# Patient Record
Sex: Male | Born: 1935 | Race: White | Hispanic: No | Marital: Married | Smoking: Former smoker
Health system: Southern US, Community
[De-identification: ages and names within clinical notes are randomized; demographics above are authoritative.]

## PROBLEM LIST (undated history)

## (undated) DIAGNOSIS — D696 Thrombocytopenia, unspecified: Secondary | ICD-10-CM

## (undated) DIAGNOSIS — E11319 Type 2 diabetes mellitus with unspecified diabetic retinopathy without macular edema: Secondary | ICD-10-CM

## (undated) DIAGNOSIS — I4891 Unspecified atrial fibrillation: Secondary | ICD-10-CM

## (undated) DIAGNOSIS — I509 Heart failure, unspecified: Secondary | ICD-10-CM

## (undated) DIAGNOSIS — E119 Type 2 diabetes mellitus without complications: Secondary | ICD-10-CM

## (undated) DIAGNOSIS — Z862 Personal history of diseases of the blood and blood-forming organs and certain disorders involving the immune mechanism: Secondary | ICD-10-CM

## (undated) DIAGNOSIS — K5790 Diverticulosis of intestine, part unspecified, without perforation or abscess without bleeding: Secondary | ICD-10-CM

## (undated) DIAGNOSIS — I10 Essential (primary) hypertension: Secondary | ICD-10-CM

## (undated) DIAGNOSIS — E7800 Pure hypercholesterolemia, unspecified: Secondary | ICD-10-CM

## (undated) DIAGNOSIS — I25810 Atherosclerosis of coronary artery bypass graft(s) without angina pectoris: Secondary | ICD-10-CM

## (undated) DIAGNOSIS — J180 Bronchopneumonia, unspecified organism: Secondary | ICD-10-CM

## (undated) DIAGNOSIS — I2789 Other specified pulmonary heart diseases: Secondary | ICD-10-CM

## (undated) DIAGNOSIS — N189 Chronic kidney disease, unspecified: Secondary | ICD-10-CM

## (undated) DIAGNOSIS — H269 Unspecified cataract: Secondary | ICD-10-CM

## (undated) DIAGNOSIS — I219 Acute myocardial infarction, unspecified: Secondary | ICD-10-CM

## (undated) DIAGNOSIS — I6529 Occlusion and stenosis of unspecified carotid artery: Secondary | ICD-10-CM

## (undated) HISTORY — DX: Type 2 diabetes mellitus with unspecified diabetic retinopathy without macular edema: E11.319

## (undated) HISTORY — PX: POLYPECTOMY: SHX149

## (undated) HISTORY — DX: Thrombocytopenia, unspecified: D69.6

## (undated) HISTORY — DX: Pure hypercholesterolemia, unspecified: E78.00

## (undated) HISTORY — DX: Chronic kidney disease, unspecified: N18.9

## (undated) HISTORY — DX: Unspecified cataract: H26.9

## (undated) HISTORY — DX: Diverticulosis of intestine, part unspecified, without perforation or abscess without bleeding: K57.90

## (undated) HISTORY — DX: Personal history of diseases of the blood and blood-forming organs and certain disorders involving the immune mechanism: Z86.2

## (undated) HISTORY — DX: Other specified pulmonary heart diseases: I27.89

## (undated) HISTORY — PX: COLONOSCOPY: SHX174

## (undated) HISTORY — DX: Atherosclerosis of coronary artery bypass graft(s) without angina pectoris: I25.810

## (undated) HISTORY — PX: TONSILLECTOMY: SUR1361

## (undated) HISTORY — DX: Essential (primary) hypertension: I10

## (undated) HISTORY — DX: Bronchopneumonia, unspecified organism: J18.0

## (undated) HISTORY — DX: Type 2 diabetes mellitus without complications: E11.9

## (undated) HISTORY — PX: EYE SURGERY: SHX253

## (undated) HISTORY — DX: Occlusion and stenosis of unspecified carotid artery: I65.29

## (undated) HISTORY — PX: CORONARY ARTERY BYPASS GRAFT: SHX141

## (undated) HISTORY — DX: Acute myocardial infarction, unspecified: I21.9

---

## 2001-08-14 ENCOUNTER — Ambulatory Visit (HOSPITAL_COMMUNITY): Admission: RE | Admit: 2001-08-14 | Discharge: 2001-08-14 | Payer: Self-pay | Admitting: Family Medicine

## 2001-08-14 ENCOUNTER — Encounter: Payer: Self-pay | Admitting: Family Medicine

## 2005-08-24 DIAGNOSIS — I219 Acute myocardial infarction, unspecified: Secondary | ICD-10-CM

## 2005-08-24 HISTORY — DX: Acute myocardial infarction, unspecified: I21.9

## 2005-10-19 ENCOUNTER — Ambulatory Visit: Payer: Self-pay | Admitting: Cardiovascular Disease

## 2005-10-23 ENCOUNTER — Encounter: Payer: Self-pay | Admitting: Cardiology

## 2005-10-23 ENCOUNTER — Ambulatory Visit: Payer: Self-pay

## 2005-10-23 ENCOUNTER — Inpatient Hospital Stay (HOSPITAL_BASED_OUTPATIENT_CLINIC_OR_DEPARTMENT_OTHER): Admission: RE | Admit: 2005-10-23 | Discharge: 2005-10-24 | Payer: Self-pay | Admitting: Cardiology

## 2005-10-23 ENCOUNTER — Ambulatory Visit: Payer: Self-pay | Admitting: Cardiology

## 2005-10-24 ENCOUNTER — Ambulatory Visit: Payer: Self-pay | Admitting: Cardiology

## 2005-10-24 ENCOUNTER — Inpatient Hospital Stay (HOSPITAL_COMMUNITY): Admission: AD | Admit: 2005-10-24 | Discharge: 2005-10-31 | Payer: Self-pay | Admitting: Cardiology

## 2005-11-13 ENCOUNTER — Ambulatory Visit: Payer: Self-pay | Admitting: Cardiology

## 2005-11-29 ENCOUNTER — Encounter (HOSPITAL_COMMUNITY): Admission: RE | Admit: 2005-11-29 | Discharge: 2006-02-27 | Payer: Self-pay | Admitting: Cardiology

## 2005-12-06 ENCOUNTER — Encounter: Payer: Self-pay | Admitting: Cardiology

## 2005-12-06 ENCOUNTER — Ambulatory Visit: Payer: Self-pay

## 2005-12-06 ENCOUNTER — Ambulatory Visit: Payer: Self-pay | Admitting: Cardiology

## 2005-12-13 ENCOUNTER — Ambulatory Visit: Payer: Self-pay | Admitting: Cardiology

## 2005-12-21 ENCOUNTER — Ambulatory Visit: Payer: Self-pay | Admitting: Cardiology

## 2005-12-25 ENCOUNTER — Ambulatory Visit: Payer: Self-pay | Admitting: Cardiology

## 2005-12-26 ENCOUNTER — Ambulatory Visit: Payer: Self-pay | Admitting: Cardiology

## 2005-12-28 ENCOUNTER — Ambulatory Visit: Payer: Self-pay | Admitting: Cardiology

## 2006-01-04 ENCOUNTER — Ambulatory Visit: Payer: Self-pay | Admitting: Internal Medicine

## 2006-01-10 ENCOUNTER — Ambulatory Visit: Payer: Self-pay | Admitting: Cardiology

## 2006-02-22 ENCOUNTER — Ambulatory Visit: Payer: Self-pay | Admitting: Cardiology

## 2006-02-28 ENCOUNTER — Encounter (HOSPITAL_COMMUNITY): Admission: RE | Admit: 2006-02-28 | Discharge: 2006-03-22 | Payer: Self-pay | Admitting: Cardiology

## 2006-03-01 ENCOUNTER — Ambulatory Visit: Payer: Self-pay | Admitting: Cardiology

## 2006-05-13 ENCOUNTER — Encounter: Payer: Self-pay | Admitting: Internal Medicine

## 2006-05-16 ENCOUNTER — Encounter: Payer: Self-pay | Admitting: Internal Medicine

## 2006-05-28 ENCOUNTER — Ambulatory Visit: Payer: Self-pay

## 2006-05-28 ENCOUNTER — Encounter: Payer: Self-pay | Admitting: Internal Medicine

## 2006-05-29 ENCOUNTER — Ambulatory Visit: Payer: Self-pay | Admitting: Cardiology

## 2006-07-01 ENCOUNTER — Encounter: Payer: Self-pay | Admitting: Internal Medicine

## 2006-09-26 ENCOUNTER — Encounter: Payer: Self-pay | Admitting: Internal Medicine

## 2006-12-03 ENCOUNTER — Ambulatory Visit: Payer: Self-pay | Admitting: Cardiology

## 2007-07-29 ENCOUNTER — Ambulatory Visit: Payer: Self-pay | Admitting: Cardiology

## 2007-09-18 ENCOUNTER — Ambulatory Visit: Payer: Self-pay

## 2007-09-18 ENCOUNTER — Encounter: Payer: Self-pay | Admitting: Cardiology

## 2008-04-23 ENCOUNTER — Ambulatory Visit: Payer: Self-pay | Admitting: Cardiovascular Disease

## 2008-04-27 ENCOUNTER — Ambulatory Visit: Payer: Self-pay

## 2008-04-27 ENCOUNTER — Ambulatory Visit: Payer: Self-pay | Admitting: Cardiovascular Disease

## 2008-04-27 LAB — CONVERTED CEMR LAB
BUN: 22 mg/dL (ref 6–23)
Basophils Absolute: 0 10*3/uL (ref 0.0–0.1)
Basophils Relative: 0.5 % (ref 0.0–3.0)
CO2: 27 meq/L (ref 19–32)
Calcium: 9.2 mg/dL (ref 8.4–10.5)
Chloride: 108 meq/L (ref 96–112)
Creatinine, Ser: 1.1 mg/dL (ref 0.4–1.5)
Eosinophils Absolute: 0.2 10*3/uL (ref 0.0–0.7)
Eosinophils Relative: 2.6 % (ref 0.0–5.0)
GFR calc Af Amer: 85 mL/min
GFR calc non Af Amer: 70 mL/min
Glucose, Bld: 164 mg/dL — ABNORMAL HIGH (ref 70–99)
HCT: 41.6 % (ref 39.0–52.0)
Hemoglobin: 14.2 g/dL (ref 13.0–17.0)
INR: 0.9 (ref 0.8–1.0)
Lymphocytes Relative: 19.8 % (ref 12.0–46.0)
MCHC: 34.2 g/dL (ref 30.0–36.0)
MCV: 94.6 fL (ref 78.0–100.0)
Monocytes Absolute: 0.6 10*3/uL (ref 0.1–1.0)
Monocytes Relative: 7.9 % (ref 3.0–12.0)
Neutro Abs: 4.9 10*3/uL (ref 1.4–7.7)
Neutrophils Relative %: 69.2 % (ref 43.0–77.0)
Platelets: 119 10*3/uL — ABNORMAL LOW (ref 150–400)
Potassium: 4.4 meq/L (ref 3.5–5.1)
Prothrombin Time: 10.2 s — ABNORMAL LOW (ref 10.9–13.3)
RBC: 4.39 M/uL (ref 4.22–5.81)
RDW: 13.2 % (ref 11.5–14.6)
Sodium: 140 meq/L (ref 135–145)
TSH: 1.05 microintl units/mL (ref 0.35–5.50)
WBC: 7.1 10*3/uL (ref 4.5–10.5)
aPTT: 29.5 s (ref 21.7–29.8)

## 2008-04-28 ENCOUNTER — Ambulatory Visit: Payer: Self-pay | Admitting: Cardiology

## 2008-04-28 ENCOUNTER — Ambulatory Visit (HOSPITAL_COMMUNITY): Admission: RE | Admit: 2008-04-28 | Discharge: 2008-04-28 | Payer: Self-pay | Admitting: Cardiology

## 2008-05-27 ENCOUNTER — Ambulatory Visit: Payer: Self-pay | Admitting: Cardiology

## 2008-07-22 DIAGNOSIS — E785 Hyperlipidemia, unspecified: Secondary | ICD-10-CM | POA: Insufficient documentation

## 2008-07-22 DIAGNOSIS — I25810 Atherosclerosis of coronary artery bypass graft(s) without angina pectoris: Secondary | ICD-10-CM | POA: Insufficient documentation

## 2008-07-22 DIAGNOSIS — E1129 Type 2 diabetes mellitus with other diabetic kidney complication: Secondary | ICD-10-CM | POA: Insufficient documentation

## 2008-07-22 DIAGNOSIS — Z862 Personal history of diseases of the blood and blood-forming organs and certain disorders involving the immune mechanism: Secondary | ICD-10-CM | POA: Insufficient documentation

## 2008-07-22 DIAGNOSIS — N184 Chronic kidney disease, stage 4 (severe): Secondary | ICD-10-CM | POA: Insufficient documentation

## 2008-07-22 DIAGNOSIS — N183 Chronic kidney disease, stage 3 unspecified: Secondary | ICD-10-CM | POA: Insufficient documentation

## 2008-07-22 DIAGNOSIS — I739 Peripheral vascular disease, unspecified: Secondary | ICD-10-CM | POA: Insufficient documentation

## 2008-07-22 DIAGNOSIS — I10 Essential (primary) hypertension: Secondary | ICD-10-CM | POA: Insufficient documentation

## 2008-07-26 ENCOUNTER — Ambulatory Visit: Payer: Self-pay | Admitting: Cardiology

## 2008-07-26 ENCOUNTER — Encounter: Payer: Self-pay | Admitting: Cardiology

## 2008-07-26 ENCOUNTER — Ambulatory Visit: Payer: Self-pay

## 2008-07-26 DIAGNOSIS — D696 Thrombocytopenia, unspecified: Secondary | ICD-10-CM | POA: Insufficient documentation

## 2008-07-26 DIAGNOSIS — I272 Pulmonary hypertension, unspecified: Secondary | ICD-10-CM | POA: Insufficient documentation

## 2008-08-02 LAB — CONVERTED CEMR LAB
Basophils Absolute: 0 10*3/uL (ref 0.0–0.1)
Basophils Relative: 0.4 % (ref 0.0–3.0)
Eosinophils Absolute: 0.3 10*3/uL (ref 0.0–0.7)
Eosinophils Relative: 3.3 % (ref 0.0–5.0)
HCT: 38.4 % — ABNORMAL LOW (ref 39.0–52.0)
Hemoglobin: 12.9 g/dL — ABNORMAL LOW (ref 13.0–17.0)
Lymphocytes Relative: 24.2 % (ref 12.0–46.0)
Lymphs Abs: 1.9 10*3/uL (ref 0.7–4.0)
MCHC: 33.5 g/dL (ref 30.0–36.0)
MCV: 94.4 fL (ref 78.0–100.0)
Monocytes Absolute: 0.4 10*3/uL (ref 0.1–1.0)
Monocytes Relative: 5.4 % (ref 3.0–12.0)
Neutro Abs: 5.4 10*3/uL (ref 1.4–7.7)
Neutrophils Relative %: 66.7 % (ref 43.0–77.0)
Platelets: 149 10*3/uL — ABNORMAL LOW (ref 150.0–400.0)
RBC: 4.06 M/uL — ABNORMAL LOW (ref 4.22–5.81)
RDW: 13.3 % (ref 11.5–14.6)
WBC: 8 10*3/uL (ref 4.5–10.5)

## 2008-08-20 ENCOUNTER — Encounter: Admission: RE | Admit: 2008-08-20 | Discharge: 2008-08-20 | Payer: Self-pay | Admitting: Family Medicine

## 2008-08-20 ENCOUNTER — Encounter: Payer: Self-pay | Admitting: Cardiology

## 2008-08-24 ENCOUNTER — Encounter: Payer: Self-pay | Admitting: Cardiology

## 2008-10-20 ENCOUNTER — Ambulatory Visit (HOSPITAL_BASED_OUTPATIENT_CLINIC_OR_DEPARTMENT_OTHER): Admission: RE | Admit: 2008-10-20 | Discharge: 2008-10-20 | Payer: Self-pay | Admitting: Urology

## 2008-10-22 ENCOUNTER — Ambulatory Visit: Payer: Self-pay | Admitting: Cardiology

## 2009-05-26 ENCOUNTER — Ambulatory Visit: Payer: Self-pay | Admitting: Cardiology

## 2009-05-26 ENCOUNTER — Telehealth: Payer: Self-pay | Admitting: Cardiology

## 2009-06-08 ENCOUNTER — Encounter (INDEPENDENT_AMBULATORY_CARE_PROVIDER_SITE_OTHER): Payer: Self-pay | Admitting: *Deleted

## 2009-06-09 ENCOUNTER — Encounter (INDEPENDENT_AMBULATORY_CARE_PROVIDER_SITE_OTHER): Payer: Self-pay | Admitting: *Deleted

## 2009-06-09 ENCOUNTER — Ambulatory Visit: Payer: Self-pay | Admitting: Internal Medicine

## 2009-06-21 ENCOUNTER — Telehealth (INDEPENDENT_AMBULATORY_CARE_PROVIDER_SITE_OTHER): Payer: Self-pay | Admitting: *Deleted

## 2009-06-23 ENCOUNTER — Ambulatory Visit: Payer: Self-pay | Admitting: Internal Medicine

## 2009-06-24 ENCOUNTER — Encounter: Payer: Self-pay | Admitting: Internal Medicine

## 2009-07-27 ENCOUNTER — Ambulatory Visit: Payer: Self-pay

## 2009-07-27 ENCOUNTER — Encounter: Payer: Self-pay | Admitting: Cardiology

## 2009-08-09 ENCOUNTER — Telehealth: Payer: Self-pay | Admitting: Cardiology

## 2009-08-23 ENCOUNTER — Encounter: Payer: Self-pay | Admitting: Cardiology

## 2009-08-23 ENCOUNTER — Ambulatory Visit: Payer: Self-pay | Admitting: Vascular Surgery

## 2009-08-29 ENCOUNTER — Ambulatory Visit (HOSPITAL_COMMUNITY): Admission: RE | Admit: 2009-08-29 | Discharge: 2009-08-29 | Payer: Self-pay | Admitting: Ophthalmology

## 2009-12-05 ENCOUNTER — Ambulatory Visit: Payer: Self-pay | Admitting: Cardiology

## 2010-02-07 ENCOUNTER — Encounter (INDEPENDENT_AMBULATORY_CARE_PROVIDER_SITE_OTHER): Payer: Self-pay | Admitting: *Deleted

## 2010-02-08 ENCOUNTER — Encounter: Payer: Self-pay | Admitting: Cardiology

## 2010-02-08 ENCOUNTER — Ambulatory Visit: Payer: Self-pay

## 2010-03-26 HISTORY — PX: CATARACT EXTRACTION: SUR2

## 2010-04-23 LAB — CONVERTED CEMR LAB
BUN: 20 mg/dL (ref 6–23)
Basophils Absolute: 0.1 10*3/uL (ref 0.0–0.1)
Basophils Relative: 0.9 % (ref 0.0–3.0)
CO2: 30 meq/L (ref 19–32)
Calcium: 9 mg/dL (ref 8.4–10.5)
Chloride: 105 meq/L (ref 96–112)
Creatinine, Ser: 1.2 mg/dL (ref 0.4–1.5)
Eosinophils Absolute: 0.2 10*3/uL (ref 0.0–0.7)
Eosinophils Relative: 2.6 % (ref 0.0–5.0)
GFR calc non Af Amer: 63.31 mL/min (ref >60)
Glucose, Bld: 99 mg/dL (ref 70–99)
HCT: 38.2 % — ABNORMAL LOW (ref 39.0–52.0)
Hemoglobin: 13 g/dL (ref 13.0–17.0)
Lymphocytes Relative: 20.1 % (ref 12.0–46.0)
Lymphs Abs: 1.9 10*3/uL (ref 0.7–4.0)
MCHC: 34.1 g/dL (ref 30.0–36.0)
MCV: 94.7 fL (ref 78.0–100.0)
Monocytes Absolute: 0.8 10*3/uL (ref 0.1–1.0)
Monocytes Relative: 8.1 % (ref 3.0–12.0)
Neutro Abs: 6.5 10*3/uL (ref 1.4–7.7)
Neutrophils Relative %: 68.3 % (ref 43.0–77.0)
Platelets: 156 10*3/uL (ref 150.0–400.0)
Potassium: 4.4 meq/L (ref 3.5–5.1)
RBC: 4.04 M/uL — ABNORMAL LOW (ref 4.22–5.81)
RDW: 13.6 % (ref 11.5–14.6)
Sodium: 139 meq/L (ref 135–145)
WBC: 9.5 10*3/uL (ref 4.5–10.5)

## 2010-04-27 NOTE — Assessment & Plan Note (Signed)
Summary: 4 MO F/U   Visit Type:  4 months follow up  CC:  No complains.  History of Present Illness: Had a circumcision done on Wednesday.  Doing pretty well from that standpoint.  Has not had full physical.  Still exercising regularly.  Current Medications (verified): 1)  Omeprazole 20 Mg Tbec (Omeprazole) .... Take 1 Tablet By Mouth Two Times A Day 2)  Metaglip 5-500 Mg Tabs (Glipizide-Metformin Hcl) .... Take Two Tablets P.o. Two Times A Day 3)  Aspirin 81 Mg Tbec (Aspirin) .... Take One Tablet By Mouth Daily 4)  Actos 45 Mg Tabs (Pioglitazone Hcl) .... Take As Directed 5)  Simvastatin 20 Mg Tabs (Simvastatin) .... Take One Tablet By Mouth Daily At Bedtime 6)  Multivitamins  Tabs (Multiple Vitamin) .... Take 1 Tablet By Mouth Once A Day 7)  Fish Oil 1200 Mg Caps (Omega-3 Fatty Acids) .... Take 1 Capsule By Mouth Once A Day 8)  Vitamin C 500 Mg  Tabs (Ascorbic Acid) .... Take 1 Tablet By Mouth Once A Day 9)  Vitamin D 400 Unit  Tabs (Cholecalciferol) .... Take 1 Tablet By Mouth Once A Day 10)  Toprol Xl 25 Mg Xr24h-Tab (Metoprolol Succinate) .... Take 1 Tablet By Mouth Once A Day 11)  Benicar 20 Mg Tabs (Olmesartan Medoxomil) .... Take 1 Tablet By Mouth Once A Day 12)  Nitroglycerin 0.4 Mg Subl (Nitroglycerin) .... One Tablet Under Tongue Every 5 Minutes As Needed For Chest Pain---May Repeat Times Three 13)  Glucosamine Msm 1500- 1500mg  .... Once A Day  Allergies (verified): No Known Drug Allergies  Vital Signs:  Patient profile:   75 year old male Height:      72 inches Weight:      238 pounds BMI:     32.40 Pulse rate:   51 / minute Pulse rhythm:   regular Resp:     18 per minute BP sitting:   128 / 62  (left arm) Cuff size:   large  Vitals Entered By: Sidney Ace (October 22, 2008 11:07 AM)  Physical Exam  General:  Well developed, well nourished, in no acute distress. Chest Wall:  Median sternotomy well healed. Lungs:  Clear bilaterally to auscultation and  percussion. Heart:  Non-displaced PMI, chest non-tender; regular rate and rhythm, S1, S2 without murmurs, rubs or gallops. Carotid upstroke normal, no bruit. Normal abdominal aortic size, no bruits. Femorals normal pulses, no bruits. Pedals normal pulses. No edema, no varicosities. Msk:  Back normal, normal gait. Muscle strength and tone normal. Extremities:  No edema.   EKG  Procedure date:  10/22/2008  Findings:      NSR.  Anterior MI, old.  Lower voltage  Impression & Recommendations:  Problem # 1:  CAD, ARTERY BYPASS GRAFT (ICD-414.04)  Stable.  No symptoms.  Weight loss suggested.  Orders: EKG w/ Interpretation (93000)  Problem # 2:  HYPERCHOLESTEROLEMIA (ICD-272.0) followuup with Dr. Kenton Kingfisher. His updated medication list for this problem includes:    Simvastatin 20 Mg Tabs (Simvastatin) .[name]... Take one tablet by mouth daily at bedtime  Patient Instructions: 1)  Your physician recommends that you continue on your current medications as directed. Please refer to the Current Medication list given to you today. 2)  Your physician wants you to follow-up in:   6 MONTHS. You will receive a reminder letter in the mail two months in advance. If you don't receive a letter, please call our office to schedule the follow-up appointment.

## 2010-04-27 NOTE — Progress Notes (Signed)
Summary: 1500 mg glucasmine  Phone Note Call from Patient Call back at Home Phone [phone]   Caller: Patient Reason for Call: Talk to Nurse Details for Reason: Per pt calling, pt still take 1500 mg glucasomine.  Initial call taken by: Neil Crouch,  May 26, 2009 2:14 PM  Follow-up for Phone Call        Left message to call back. Theodosia Quay, RN, BSN  May 26, 2009 2:41 PM  I spoke with the pt's wife and she said the pt is also taking glucosamine and this was not on his list.  I told the pt's wife that I would update his medication list.  Follow-up by: Theodosia Quay, RN, BSN,  May 30, 2009 12:11 PM    New/Updated Medications: GLUCOSAMINE 500 MG CAPS (GLUCOSAMINE SULFATE) 3 daily

## 2010-04-27 NOTE — Procedures (Signed)
Summary: EGD/[facility]   Imported By: Phillis Knack 07/05/2009 11:48:01  _____________________________________________________________________  External Attachment:    Type:   Image     Comment:   External Document

## 2010-04-27 NOTE — Procedures (Signed)
Summary: Colonoscopy  Patient: [name] Note: All result statuses are Final unless otherwise noted.  Tests: (1) Colonoscopy (COL)   COL Colonoscopy           [address]     [place], [place]  [id number]           COLONOSCOPY PROCEDURE REPORT           PATIENT:  [name], [name]  MR#:  [id number]     BIRTHDATE:  05-May-1935, 73 yrs. old  GENDER:  male     ENDOSCOPIST:  Docia Chuck. Geri Seminole, MD     REF. BY:  .Direct Self     PROCEDURE DATE:  06/23/2009     PROCEDURE:  Colonoscopy with snare polypectomy x 2     ASA CLASS:  Class II     INDICATIONS:  history of pre-cancerous (adenomatous) colon polyps     ; reports hx adenomas x 2  and suboptimal prep w/ Dr Ferdinand Lango in     [place] 3 years ago - received a recall letter     MEDICATIONS:   Fentanyl 75 mcg IV, Versed 10 mg IV           DESCRIPTION OF PROCEDURE:   After the risks benefits and     alternatives of the procedure were thoroughly explained, informed     consent was obtained.  Digital rectal exam was performed and     revealed no abnormalities.   The LB [id number] endoscope     was introduced through the anus and advanced to the cecum, which     was identified by both the appendix and ileocecal valve, limited     by fair prep. Time to cecum = 7:58 min. The quality of the prep     was Moviprep fair.  The instrument was then slowly withdrawn (time     = 17:15 min) as the colon was fully examined.     <<PROCEDUREIMAGES>>           FINDINGS:  Two polyps, 17mm,4mm,  were found in the ascending     colon. Polyps were snared without cautery. Retrieval was     successful. snare polyp  Mild diverticulosis was found in the     sigmoid colon.  fair prep upgraded to adequate w/ vigorous     irrigation and suctioning..   Retroflexed views in the rectum     revealed no abnormalities.    The scope was then withdrawn from     the patient and the procedure completed.           COMPLICATIONS:   None     ENDOSCOPIC IMPRESSION:     1) Two polyps in the ascending colon -removed     2) Mild diverticulosis in the sigmoid colon     3) Fair prep     RECOMMENDATIONS:     1) Obtain prior colonoscopy and pathology reports from Dr. Virgel Bouquet [facility]) for my review     2) Follow up colonoscopy in 3 years. RECOMMEND NO SEEDS / NUTS /     BEANS / RUFFAGE ONE WEEK PRIOR AND CLEAR LIQUIDS TWO DAYS PRIOR           ______________________________     Docia Chuck. Geri Seminole, MD           CC:  Azalia Bilis, MD;The Patient  n.     eSIGNED:   Docia Chuck. Geri Seminole at 06/23/2009 11:13 AM           Alberteen Spindle, [id number]  Note: An exclamation mark (!) indicates a result that was not dispersed into the flowsheet. Document Creation Date: 06/23/2009 11:14 AM _______________________________________________________________________  (1) Order result status: Final Collection or observation date-time: 06/23/2009 11:03 Requested date-time:  Receipt date-time:  Reported date-time:  Referring Physician:   Ordering Physician: Lavena Bullion [id number]) Specimen Source:  Source: Tawanna Cooler Order Number: [id number] Lab site:   Appended Document: Colonoscopy recall     Procedures Next Due Date:    Colonoscopy: 06/2012

## 2010-04-27 NOTE — Assessment & Plan Note (Signed)
Summary: 2 month rov/sl      Allergies Added: NKDA  Visit Type:  Follow-up  CC:  No complains.  History of Present Illness: Back to being active, and no recurrence of symptoms.  Still taking antacid twice daily. But feels better.   Working out on a regular basis, 3 or 4 times/week.  No problems at all on the treadmill. Cut back to 50 above resting heartrate. No chest pain.  Current Medications (verified): 1)  Omeprazole 20 Mg Tbec (Omeprazole) .... Take 1 Tablet By Mouth Two Times A Day 2)  Metaglip 5-500 Mg Tabs (Glipizide-Metformin Hcl) .... Take Two Tablets P.o. Two Times A Day 3)  Aspirin 81 Mg Tbec (Aspirin) .... Take One Tablet By Mouth Daily 4)  Actos 45 Mg Tabs (Pioglitazone Hcl) .... Take As Directed 5)  Simvastatin 20 Mg Tabs (Simvastatin) .... Take One Tablet By Mouth Daily At Bedtime 6)  Multivitamins  Tabs (Multiple Vitamin) .... Take 1 Tablet By Mouth Once A Day 7)  Fish Oil 1200 Mg Caps (Omega-3 Fatty Acids) .... Take 1 Capsule By Mouth Once A Day 8)  Vitamin C 500 Mg  Tabs (Ascorbic Acid) .... Take 1 Tablet By Mouth Once A Day 9)  Vitamin D 400 Unit  Tabs (Cholecalciferol) .... Take 1 Tablet By Mouth Once A Day 10)  Toprol Xl 25 Mg Xr24h-Tab (Metoprolol Succinate) .... Take 1 Tablet By Mouth Once A Day 11)  Benicar 20 Mg Tabs (Olmesartan Medoxomil) .... Take 1 Tablet By Mouth Once A Day 12)  Nitroglycerin 0.4 Mg Subl (Nitroglycerin) .... One Tablet Under Tongue Every 5 Minutes As Needed For Chest Pain---May Repeat Times Three  Allergies (verified): No Known Drug Allergies  Vital Signs:  Patient profile:   75 year old male Height:      72 inches Weight:      236.25 pounds BMI:     32.16 Pulse rate:   50 / minute Pulse rhythm:   regular Resp:     18 per minute BP sitting:   130 / 70  (left arm) Cuff size:   large  Vitals Entered By: Sidney Ace (Jul 26, 2008 3:20 PM)  Physical Exam  General:  Well developed, well nourished, in no acute  distress. Lungs:  Clear bilaterally to auscultation and percussion. Heart:  No definite murmur.   Extremities:  No edema   EKG  Procedure date:  07/26/2008  Findings:      NSR.  Left axis deviation.  Anterolateral infarct, old  Echocardiogram  Procedure date:  07/26/2008  Findings:      Study Conclusions  1. Left ventricle: The cavity size was normal. Wall thickness was    normal. Doppler parameters are consistent with abnormal left    ventricular relaxation (grade 1 diastolic dysfunction). 2. Mitral valve: Mildly calcified annulus. Mildly thickened    leaflets. . 3. Pulmonary arteries: PA peak pressure: 48mm Hg (S).   Impression & Recommendations:  Problem # 1:  CAD, ARTERY BYPASS GRAFT (ICD-414.04) see cath report.  Continue medical therapy. His updated medication list for this problem includes:    Aspirin 81 Mg Tbec (Aspirin) .[name]... Take one tablet by mouth daily    Toprol Xl 25 Mg Xr24h-tab (Metoprolol succinate) .[name]... Take 1 tablet by mouth once a day    Nitroglycerin 0.4 Mg Subl (Nitroglycerin) ..... One tablet under tongue every 5 minutes as needed for chest pain---may repeat times three  Orders: TLB-CBC Platelet - w/Differential (85025-CBCD) EKG w/ Interpretation (93000)  Problem # 2:  HYPERCHOLESTEROLEMIA (ICD-272.0) Continue followup with primary MD. His updated medication list for this problem includes:    Simvastatin 20 Mg Tabs (Simvastatin) .[name]... Take one tablet by mouth daily at bedtime  Problem # 3:  UNSPECIFIED THROMBOCYTOPENIA (ICD-287.5) Recheck platelet count.  Borderline at time of cath procedure.  Problem # 4:  HYPERTENSION (ICD-401.9) Continue. His updated medication list for this problem includes:    Aspirin 81 Mg Tbec (Aspirin) .[name]... Take one tablet by mouth daily    Toprol Xl 25 Mg Xr24h-tab (Metoprolol succinate) .[name]... Take 1 tablet by mouth once a day    Benicar 20 Mg Tabs (Olmesartan medoxomil) .[name]... Take 1 tablet by mouth once a  day  Orders: TLB-CBC Platelet - w/Differential (85025-CBCD) EKG w/ Interpretation (93000)  Problem # 5:  PULMONARY HYPERTENSION (ICD-416.8) Borderline by echo.  He will talk with his wife and we will consider a sleep study.  Patient Instructions: 1)  Your physician recommends that you schedule a follow-up appointment in: 3-4 months 2)  Your physician recommends that you have lab work today: CBC(platelet) 3)  Your physician recommends that you continue on your current medications as directed. Please refer to the Current Medication list given to you today.

## 2010-04-27 NOTE — Letter (Signed)
Summary: [facility] Instructions  [facility]  [place], [place] [address]   Phone: [phone]  Fax: [phone]       [name]    February 09, 1936    MRN: [id number]        Procedure Day Sudie Grumbling:  Raquel Sarna  06/23/09     Arrival Time:  9:00AM     Procedure Time:  10:00AM     Location of Procedure:                    [address] _  [place] (4th Floor)                        [place]   Starting 5 days prior to your procedure 06/18/09 do not eat nuts, seeds, popcorn, corn, beans, peas,  salads, or any raw vegetables.  Do not take any fiber supplements (e.g. Metamucil, Citrucel, and Benefiber).  THE DAY BEFORE YOUR PROCEDURE         DATE: 06/22/09  DAY: WEDNESDAY  1.  Drink clear liquids the entire day-NO SOLID FOOD  2.  Do not drink anything colored red or purple.  Avoid juices with pulp.  No orange juice.  3.  Drink at least 64 oz. (8 glasses) of fluid/clear liquids during the day to prevent dehydration and help the prep work efficiently.  CLEAR LIQUIDS INCLUDE: Water Jello Ice Popsicles Tea (sugar ok, no milk/cream) Powdered fruit flavored drinks Coffee (sugar ok, no milk/cream) Gatorade Juice: apple, white grape, white cranberry  Lemonade Clear bullion, consomm, broth Carbonated beverages (any kind) Strained chicken noodle soup Hard Candy                             4.  In the morning, mix first dose of MoviPrep solution:    Empty 1 Pouch A and 1 Pouch B into the disposable container    Add lukewarm drinking water to the top line of the container. Mix to dissolve    Refrigerate (mixed solution should be used within 24 hrs)  5.  Begin drinking the prep at 5:00 p.m. The MoviPrep container is divided by 4 marks.   Every 15 minutes drink the solution down to the next mark (approximately 8 oz) until the full liter is complete.   6.  Follow completed prep with 16 oz of clear liquid of your choice  (Nothing red or purple).  Continue to drink clear liquids until bedtime.  7.  Before going to bed, mix second dose of MoviPrep solution:    Empty 1 Pouch A and 1 Pouch B into the disposable container    Add lukewarm drinking water to the top line of the container. Mix to dissolve    Refrigerate  THE DAY OF YOUR PROCEDURE      DATE: 06/23/09 DAY: THURSDAY  Beginning at 5:00AM (5 hours before procedure):         1. Every 15 minutes, drink the solution down to the next mark (approx 8 oz) until the full liter is complete.  2. Follow completed prep with 16 oz. of clear liquid of your choice.    3. You may drink clear liquids until 8:00AM (2 HOURS BEFORE PROCEDURE).   MEDICATION INSTRUCTIONS  Unless otherwise instructed, you should take regular prescription medications with a small sip of water   as early as possible the morning of  your procedure.  Diabetic patients - see separate instructions.           OTHER INSTRUCTIONS  You will need a responsible adult at least 73 years of age to accompany you and drive you home.   This person must remain in the waiting room during your procedure.  Wear loose fitting clothing that is easily removed.  Leave jewelry and other valuables at home.  However, you may wish to bring a book to read or  an iPod/MP3 player to listen to music as you wait for your procedure to start.  Remove all body piercing jewelry and leave at home.  Total time from sign-in until discharge is approximately 2-3 hours.  You should go home directly after your procedure and rest.  You can resume normal activities the  day after your procedure.  The day of your procedure you should not:   Drive   Make legal decisions   Operate machinery   Drink alcohol   Return to work  You will receive specific instructions about eating, activities and medications before you leave.    The above instructions have been reviewed and explained to me by   Randall Hiss RN  June 09, 2009 8:28 AM    I fully understand and can verbalize these instructions _____________________________ Date _________

## 2010-04-27 NOTE — Miscellaneous (Signed)
Summary: Orders Update  Clinical Lists Changes  Orders: Added new Test order of Carotid Duplex (Carotid Duplex) - Signed 

## 2010-04-27 NOTE — Letter (Signed)
Summary: Diabetic Instructions  [facility]  [address]   [place], [place] [address]   Phone: [phone]  Fax: [phone]    [name] Jun 04, 1935 MRN: [id number]      ORAL DIABETIC MEDICATION INSTRUCTIONS  The day before your procedure:   Take your diabetic pill as you do normally  The day of your procedure:   Do not take your diabetic pill    We will check your blood sugar levels during the admission process and again in Recovery before discharging you home  ________________________________________________________________________

## 2010-04-27 NOTE — Consult Note (Signed)
Summary: [facility]  [facility]   Imported By: Ranell Patrick 09/27/2009 13:30:30  _____________________________________________________________________  External Attachment:    Type:   Image     Comment:   External Document

## 2010-04-27 NOTE — Miscellaneous (Signed)
Summary: [facility] previsti  Clinical Lists Changes  Medications: Added new medication of MOVIPREP 100 GM  SOLR (PEG-KCL-NACL-NASULF-NA ASC-C) As per prep instructions. - Signed Rx of MOVIPREP 100 GM  SOLR (PEG-KCL-NACL-NASULF-NA ASC-C) As per prep instructions.;  #1 x 0;  Signed;  Entered by: Randall Hiss RN;  Authorized by: Irene Shipper MD;  Method used: Electronically to [facility] [address], [place], [place]  [id number], Ph: [phone], Fax: [phone]    Prescriptions: MOVIPREP 100 GM  SOLR (PEG-KCL-NACL-NASULF-NA ASC-C) As per prep instructions.  #1 x 0   Entered by:   Randall Hiss RN   Authorized by:   Irene Shipper MD   Signed by:   Randall Hiss RN on 06/09/2009   Method used:   Electronically to        [facility]* (retail)       [place], [place]  [id number]       Ph: [phone]       Fax: [phone]   RxID:   [id number]

## 2010-04-27 NOTE — Procedures (Signed)
Summary: EGD/[facility]   Imported By: Phillis Knack 07/05/2009 11:46:12  _____________________________________________________________________  External Attachment:    Type:   Image     Comment:   External Document

## 2010-04-27 NOTE — Assessment & Plan Note (Signed)
Summary: 6 month  g  Visit Type:  6 mo f/u Primary Provider:  Azalia Bilis  CC:  denies any complaints today.  History of Present Illness: Had GI eval with endo, and then had eye surgery.  Had glaucoma surgery.  Feels good, and exercising regularly.    Current Medications (verified): 1)  Omeprazole 20 Mg Tbec (Omeprazole) .... Take 1 Tablet By Mouth Two Times A Day 2)  Metaglip 5-500 Mg Tabs (Glipizide-Metformin Hcl) .... Take Two Tablets P.o. Two Times A Day 3)  Aspirin 81 Mg Tbec (Aspirin) .... Take One Tablet By Mouth Daily 4)  Actos 45 Mg Tabs (Pioglitazone Hcl) .... Take As Directed 5)  Simvastatin 20 Mg Tabs (Simvastatin) .... Take One Tablet By Mouth Daily At Bedtime 6)  Multivitamins  Tabs (Multiple Vitamin) .... Take 1 Tablet By Mouth Once A Day 7)  Fish Oil 1200 Mg Caps (Omega-3 Fatty Acids) .... Take 1 Capsule By Mouth Once A Day 8)  Vitamin C 500 Mg  Tabs (Ascorbic Acid) .... Take 1 Tablet By Mouth Once A Day 9)  Vitamin D3 1000 Unit Caps (Cholecalciferol) .... Take 1 Capsule By Mouth Once A Day 10)  Toprol Xl 25 Mg Xr24h-Tab (Metoprolol Succinate) .... Take 1 Tablet By Mouth Once A Day 11)  Benicar 20 Mg Tabs (Olmesartan Medoxomil) .... Take 1 Tablet By Mouth Once A Day 12)  Nitroglycerin 0.4 Mg Subl (Nitroglycerin) .... One Tablet Under Tongue Every 5 Minutes As Needed For Chest Pain---May Repeat Times Three 13)  Glucosamine 500 Mg Caps (Glucosamine Sulfate) .[name].. 1 Cap Once Daily  Allergies: 1)  ! * Poison Ivy  Vital Signs:  Patient profile:   75 year old male Height:      72 inches Weight:      229.12 pounds BMI:     31.19 Pulse rate:   58 / minute Pulse rhythm:   irregular BP sitting:   128 / 72  (left arm) Cuff size:   large  Vitals Entered By: Julaine Hua, CMA (December 05, 2009 12:48 PM)  Physical Exam  General:  Well developed, well nourished, in no acute distress. Head:  normocephalic and atraumatic Eyes:  PERRLA/EOM intact; conjunctiva and lids  normal. Lungs:  Clear bilaterally to auscultation and percussion. Heart:  PMI non displaced.  Normal S1 and S2.    Abdomen:  Bowel sounds positive; abdomen soft and non-tender without masses, organomegaly, or hernias noted. No hepatosplenomegaly. Extremities:  No clubbing or cyanosis. Neurologic:  Alert and oriented x 3.   EKG  Procedure date:  12/05/2009  Findings:      NSR.  First degree AV block.  Left axis deviation. ASMI, old.  Impression & Recommendations:  Problem # 1:  CAD, ARTERY BYPASS GRAFT (ICD-414.04) Assessment Unchanged Overall appears stable at present.  Good exercise tolerance.  No chest pain.  Stable status His updated medication list for this problem includes:    Aspirin 81 Mg Tbec (Aspirin) .[name]... Take one tablet by mouth daily    Toprol Xl 25 Mg Xr24h-tab (Metoprolol succinate) .[name]... Take 1 tablet by mouth once a day    Nitroglycerin 0.4 Mg Subl (Nitroglycerin) ..... One tablet under tongue every 5 minutes as needed for chest pain---may repeat times three  Problem # 2:  DIABETES MELLITUS, TYPE II (ICD-250.00) Hgb A!c in the 6 plus range.  Weight an issue, difficult losing.  Encourage His updated medication list for this problem includes:    Metaglip 5-500 Mg Tabs (Glipizide-metformin hcl) .Marland KitchenMarland KitchenMarland KitchenMarland Kitchen  Take two tablets p.o. two times a day    Aspirin 81 Mg Tbec (Aspirin) .[name]... Take one tablet by mouth daily    Actos 45 Mg Tabs (Pioglitazone hcl) .[name]... Take as directed    Benicar 20 Mg Tabs (Olmesartan medoxomil) .[name]... Take 1 tablet by mouth once a day  Problem # 3:  HYPERTENSION (ICD-401.9) Good control on current medications.  His updated medication list for this problem includes:    Aspirin 81 Mg Tbec (Aspirin) .[name]... Take one tablet by mouth daily    Toprol Xl 25 Mg Xr24h-tab (Metoprolol succinate) .[name]... Take 1 tablet by mouth once a day    Benicar 20 Mg Tabs (Olmesartan medoxomil) .[name]... 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 Donnetta Hutching, and they recommended followup in one year.  Dr. Donnetta Hutching will do doppler in his office.  His updated medication list for this problem includes:    Aspirin 81 Mg Tbec (Aspirin) .[name]... Take one tablet by mouth daily  Patient Instructions: 1)  Your physician recommends that you schedule a follow-up appointment in: 1 year with Dr. Lia Foyer Prescriptions: NITROGLYCERIN 0.4 MG SUBL (NITROGLYCERIN) One tablet under tongue every 5 minutes as needed for chest pain---may repeat times three  #25 x 9   Entered by:   Julaine Hua, CMA   Authorized by:   Wadie Lessen, MD, [facility]   Signed by:   Julaine Hua, CMA on 12/05/2009   Method used:   Print then Give to Patient   RxID:   [id number]

## 2010-04-27 NOTE — Letter (Signed)
Summary: Patient Notice- Polyp Results  [facility]  [address] [place], [place] [address]   Phone: [phone]  Fax: [phone]        June 24, 2009 MRN: [id number]    [facility] [address] [place], [place]  [address]    Dear Mr. Wampole,  I am pleased to inform you that the colon polyp(s) removed during your recent colonoscopy was (were) found to be benign (no cancer detected) upon pathologic examination.  I recommend you have a repeat colonoscopy examination in 3 years to look for recurrent polyps, as having colon polyps increases your risk for having recurrent polyps or even colon cancer in the future.  Should you develop new or worsening symptoms of abdominal pain, bowel habit changes or bleeding from the rectum or bowels, please schedule an evaluation with either your primary care physician or with me.  Additional information/recommendations:  __ No further action with gastroenterology is needed at this time. Please      follow-up with your primary care physician for your other healthcare      needs.  Please call us if you are having persistent problems or have questions about your condition that have not been fully answered at this time.  Sincerely,  Irene Shipper MD  This letter has been electronically signed by your physician.  Appended Document: Patient Notice- Polyp Results Letter mailed 4.5.11

## 2010-04-27 NOTE — Assessment & Plan Note (Signed)
Summary: f67m   Visit Type:  6 months follow up  CC:  No cardiac complains.  History of Present Illness: He is doing very well.  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  Has been doing well.  He had no problems.  Exercise tolerance has been good.    Current Medications (verified): 1)  Omeprazole 20 Mg Tbec (Omeprazole) .... Take 1 Tablet By Mouth Two Times A Day 2)  Metaglip 5-500 Mg Tabs (Glipizide-Metformin Hcl) .... Take Two Tablets P.o. Two Times A Day 3)  Aspirin 81 Mg Tbec (Aspirin) .... Take One Tablet By Mouth Daily 4)  Actos 45 Mg Tabs (Pioglitazone Hcl) .... Take As Directed 5)  Simvastatin 20 Mg Tabs (Simvastatin) .... Take One Tablet By Mouth Daily At Bedtime 6)  Multivitamins  Tabs (Multiple Vitamin) .... Take 1 Tablet By Mouth Once A Day 7)  Fish Oil 1200 Mg Caps (Omega-3 Fatty Acids) .... Take 1 Capsule By Mouth Once A Day 8)  Vitamin C 500 Mg  Tabs (Ascorbic Acid) .... Take 1 Tablet By Mouth Once A Day 9)  Vitamin D3 1000 Unit Caps (Cholecalciferol) .... Take 1 Capsule By Mouth Once A Day 10)  Toprol Xl 25 Mg Xr24h-Tab (Metoprolol Succinate) .... Take 1 Tablet By Mouth Once A Day 11)  Benicar 20 Mg Tabs (Olmesartan Medoxomil) .... Take 1 Tablet By Mouth Once A Day 12)  Nitroglycerin 0.4 Mg Subl (Nitroglycerin) .... One Tablet Under Tongue Every 5 Minutes As Needed For Chest Pain---May Repeat Times Three  Allergies (verified): 1)  ! * Poison Ivy  Vital Signs:  Patient profile:   75 year old male Height:      72 inches Weight:      229.25 pounds BMI:     31.20 Pulse rate:   51 / minute Pulse rhythm:   irregular Resp:     18 per minute BP sitting:   114 / 70  (left arm) Cuff size:   large  Vitals Entered By: Sidney Ace (May 26, 2009 11:50 AM)  Physical Exam  General:  Well developed, well nourished, in no acute distress. Head:  normocephalic and atraumatic Lungs:  Clear bilaterally to auscultation and percussion. Heart:  PMI non displaced.   Normal S1 and S2.  NO definite murmurs.   Abdomen:  Bowel sounds positive; abdomen soft and non-tender without masses, organomegaly, or hernias noted. No hepatosplenomegaly.   EKG  Procedure date:  05/26/2009  Findings:      NSR.  Anterolateral MI, old.  Leftward axis.  No acute changes.  Impression & Recommendations:  Problem # 1:  CAD, ARTERY BYPASS GRAFT (ICD-414.04) Currently stable on a medical regimen.  Doing well at present without current symptoms. His updated medication list for this problem includes:    Aspirin 81 Mg Tbec (Aspirin) .[name]... Take one tablet by mouth daily    Toprol Xl 25 Mg Xr24h-tab (Metoprolol succinate) .[name]... Take 1 tablet by mouth once a day    Nitroglycerin 0.4 Mg Subl (Nitroglycerin) ..... One tablet under tongue every 5 minutes as needed for chest pain---may repeat times three  Orders: EKG w/ Interpretation (93000) Carotid Duplex (Carotid Duplex) Abdominal Aorta Duplex (Abd Aorta Duplex) TLB-BMP (Basic Metabolic Panel-BMET) (99991111) TLB-CBC Platelet - w/Differential (85025-CBCD)  Problem # 2:  HYPERTENSION (ICD-401.9) Controlled on current medical regimen. His updated medication list for this problem includes:    Aspirin 81 Mg Tbec (Aspirin) .[name]... Take one tablet by mouth daily    Toprol  Xl 25 Mg Xr24h-tab (Metoprolol succinate) .[name]... Take 1 tablet by mouth once a day    Benicar 20 Mg Tabs (Olmesartan medoxomil) .[name]... Take 1 tablet by mouth once a day  Orders: EKG w/ Interpretation (93000) Carotid Duplex (Carotid Duplex) Abdominal Aorta Duplex (Abd Aorta Duplex) TLB-BMP (Basic Metabolic Panel-BMET) (99991111) TLB-CBC Platelet - w/Differential (85025-CBCD)  Problem # 3:  HYPERCHOLESTEROLEMIA (ICD-272.0) Tolerating well at present.  His updated medication list for this problem includes:    Simvastatin 20 Mg Tabs (Simvastatin) .[name]... Take one tablet by mouth daily at bedtime  Problem # 4:  DIABETES MELLITUS, TYPE II  (ICD-250.00) Managed nicely by his primary MD.  His updated medication list for this problem includes:    Metaglip 5-500 Mg Tabs (Glipizide-metformin hcl) .[name]... Take two tablets p.o. two times a day    Aspirin 81 Mg Tbec (Aspirin) .[name]... Take one tablet by mouth daily    Actos 45 Mg Tabs (Pioglitazone hcl) .[name]... Take as directed    Benicar 20 Mg Tabs (Olmesartan medoxomil) .[name]... Take 1 tablet by mouth once a day  Problem # 5:  ANEMIA, HX OF (ICD-V12.3) Has been stable, will recheck.  Patient Instructions: 1)  Your physician recommends that you have lab work today: BMP, CBC 2)  Your physician wants you to follow-up in:   6 MONTHS. You will receive a reminder letter in the mail two months in advance. If you don't receive a letter, please call our office to schedule the follow-up appointment. 3)  Your physician has requested that you have an abdominal aorta duplex in MAY. During this test, an ultrasound is used to evaluate the aorta. Allow 30 minutes for this exam. Do not eat after midnight the day before and avoid carbonated beverages. There are no restrictions or special instructions. 4)  Your physician has requested that you have a carotid duplex in MAY. This test is an ultrasound of the carotid arteries in your neck. It looks at blood flow through these arteries that supply the brain with blood. Allow one hour for this exam. There are no restrictions or special instructions. 5)  Your physician recommends that you continue on your current medications as directed. Please refer to the Current Medication list given to you today.

## 2010-04-27 NOTE — Procedures (Signed)
Summary: Colonoscopy/[facility]   Imported By: Phillis Knack 07/05/2009 11:51:05  _____________________________________________________________________  External Attachment:    Type:   Image     Comment:   External Document

## 2010-04-27 NOTE — Progress Notes (Signed)
Summary: [facility] Referral  Phone Note Outgoing Call   Call placed by: Theodosia Quay, RN, BSN,  Aug 09, 2009 5:51 PM Summary of Call: I spoke with the pt's wife and the pt does want to proceed with referral to Dr Early for carotid stenosis.  The pt is scheduled to retire at the end of June and would like to get this appt scheduled ASAP with Dr Donnetta Hutching. Order placed for referral to [facility]. Initial call taken by: Theodosia Quay, RN, BSN,  Aug 09, 2009 5:51 PM

## 2010-04-27 NOTE — Procedures (Signed)
Summary: Colonoscopy/[facility]   Imported By: Phillis Knack 07/05/2009 11:52:03  _____________________________________________________________________  External Attachment:    Type:   Image     Comment:   External Document

## 2010-04-27 NOTE — Progress Notes (Signed)
Summary: prep ?'s  Phone Note Call from Patient Call back at [phone]   Caller: Patient Call For: Dr. Henrene Pastor Reason for Call: Talk to Nurse Summary of Call: prep ?'s Initial call taken by: Lucien Mons,  June 21, 2009 10:58 AM  Follow-up for Phone Call        pt's questions were answered Follow-up by: Ulice Dash RN,  June 21, 2009 11:22 AM

## 2010-06-19 LAB — GLUCOSE, CAPILLARY
Glucose-Capillary: 111 mg/dL — ABNORMAL HIGH (ref 70–99)
Glucose-Capillary: 69 mg/dL — ABNORMAL LOW (ref 70–99)

## 2010-07-02 LAB — POCT I-STAT 4, (NA,K, GLUC, HGB,HCT)
Glucose, Bld: 124 mg/dL — ABNORMAL HIGH (ref 70–99)
HCT: 44 % (ref 39.0–52.0)
Hemoglobin: 15 g/dL (ref 13.0–17.0)
Potassium: 4.2 mEq/L (ref 3.5–5.1)
Sodium: 136 mEq/L (ref 135–145)

## 2010-07-02 LAB — GLUCOSE, CAPILLARY: Glucose-Capillary: 117 mg/dL — ABNORMAL HIGH (ref 70–99)

## 2010-07-06 ENCOUNTER — Telehealth: Payer: Self-pay | Admitting: Cardiology

## 2010-07-06 NOTE — Telephone Encounter (Signed)
Pt wants to know does he need to have another carotid test.

## 2010-07-06 NOTE — Telephone Encounter (Signed)
I spoke with the pt and made him aware that he is scheduled for carotid duplex and OV with Dr Kellie Simmering on 08/22/10.  The pt will be due to see Dr Lia Foyer in September 2012 (recall in system).

## 2010-07-11 LAB — GLUCOSE, CAPILLARY: Glucose-Capillary: 155 mg/dL — ABNORMAL HIGH (ref 70–99)

## 2010-08-08 NOTE — Consult Note (Signed)
NEW PATIENT CONSULTATION   [name], [name]  DOB:  Dec 31, 1935                                       08/23/2009  [id number]   The patient is a 75 year old male patient referred by Dr. Addison Lank  for carotid occlusive disease.  The patient has had known carotid  disease at least since his heart bypass done in 2007 which has most  recently progressed on the right side and he was referred for  evaluation.  He has no history of stroke, TIAs, amaurosis fugax,  diplopia, blurred vision or syncope.   CHRONIC MEDICAL PROBLEMS:  1. Non-insulin-dependent diabetes mellitus.  2. Hypertension.  3. Coronary artery disease, previous coronary artery bypass grafting      in 2007 by Dr. Cyndia Bent with cardiac cath in February of 2010 doing      well and asymptomatic.  4. Negative for COPD, hyperlipidemia or stroke.   FAMILY HISTORY:  Positive for coronary artery disease.  Father died of a  myocardial infarction at age 40.  Negative for diabetes or stroke.   SOCIAL HISTORY:  He is married and has two children.  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  Does not use tobacco, has not since 1972.  Does not  use alcohol.   REVIEW OF SYSTEMS:  Did have a history of ulcer disease after his  coronary artery bypass grafting which subsequently healed.  Denies any  chest pain, dyspnea on exertion, PND, orthopnea.  No wheezing, asthma.  Denies any other active GI or GU symptoms.  No neurologic symptoms.  All  systems in review of systems are negative.   PHYSICAL EXAM:  Vital signs:  Blood pressure 143/79, heart rate 53,  respirations 16.  General:  This is a well-developed, well-nourished  male in no apparent distress, alert and oriented times 3.  HEENT:  Exam  normal.  EOMs intact.  Lungs:  Are clear with no rhonchi or wheezing.  Cardiovascular:  Regular rhythm.  No murmurs.  Carotid pulses are 3+  with soft bruit on  the right.  There is no distal edema.  Abdomen:  Soft, nontender with no masses.  Musculoskeletal:  Exam is free of major  deformities.  Neurologic:  Exam is normal with no weakness.  Skin:  Free  of rashes.  Lower extremity exam reveals 3+ femoral, 2+ dorsalis pedis  pulses bilaterally.   Today I reviewed the clinical records supplied by Dr. Lia Foyer including  the vascular lab study dated 07/27/2009.  It appears that he has an  approximate 70% right internal carotid stenosis and mild left internal  carotid flow reduction.  He also had duplex of his abdominal aorta which  was unremarkable.  Also reviewed the report of his cardiac  catheterization performed in February of 2010.   IMPRESSION:  Moderate right internal carotid stenosis approximating 70%  which is asymptomatic.   PLAN:  I will see the patient back in 12 months with a carotid duplex  exam performed in our office at that time.  I will not repeat the exam  today since it was done at [facility] earlier this month.  If he  develops any neurologic symptoms suspicious for TIAs he will be in touch  with [place] at that time.  He  will continue on his daily aspirin.     Nelda Severe Kellie Simmering, M.D.  Electronically Signed   JDL/MEDQ  D:  08/23/2009  T:  08/24/2009  Job:  [id number]   cc:   Loretha Brasil. Lia Foyer, MD, [facility]  Azalia Bilis, M.D.

## 2010-08-08 NOTE — Procedures (Signed)
[facility]                              EXERCISE TREADMILL   NAME:[name], [name]                      MRN:          [id number]  DATE:09/18/2007                            DOB:          1935-07-16    Duration of exercise was 10 minutes 30 seconds, maximum heart rate 136,  and percent of PMHR is 90%.   COMMENTS:  Esta exercised today on the Bruce protocol.  He was able to  go a minute into stage IV.  He could have actually gone farther, but I  stopped him at this point as he was in a fairly high rate of perceived  exertion.  His blood pressure rose appropriately with exercise.  Heart  rate reached 136 at peak exercise, there was no significant ST  depression.  The resting electrocardiogram demonstrated a normal sinus  rhythm, there was a delay in R-wave progression at maximum exercise.  No  significant ST depression was noted.  There was bigeminal PVCs in  recovery, but otherwise unremarkable.  One ventricular couplet was  noted.   RISK SUMMATION:  The patient has undergone revascularization surgery.  A  2D echocardiogram today demonstrates preserved overall LV function.  I  reviewed the study with Dr. Glori Bickers.  The patient has an  ejection fraction in the range of [id number] with diastolic abnormalities.  The  current study demonstrates no evidence of exercise-induced ST depression  or chest pain at maximum exercise.  The patient's heart rate is  generally 80% or below with his exercise, and he appears to be safe from  the standpoint.  We will see him back in followup in 1 year, and  continued followup with Dr. Kenton Kingfisher was strongly recommended.     Loretha Brasil. Lia Foyer, MD, [facility]  Electronically Signed    TDS/MedQ  DD: 09/18/2007  DT: 09/19/2007  Job #: [id number]   cc:   Azalia Bilis, M.D.

## 2010-08-08 NOTE — Assessment & Plan Note (Signed)
[place] OFFICE NOTE   NAME:[name], [name]                      MRN:          [id number]  DATE:12/03/2006                            DOB:          Jun 05, 1935    [name] is in for a followup visit. To briefly summarize, he is  getting along well. He has not been having any ongoing chest pain and  his wife thinks he is really doing quite nicely. He has had follow up of  his lipid profiles with Dr. Kenton Kingfisher. He has also apparently had 2  colonoscopies, and subsequently had polyp resection and was found to  have an ulcer. Apparently his hemoglobin has returned to normal.   CURRENT MEDICATIONS:  Include:  1. Actos 45 mg daily.  2. Simvastatin 20 mg daily.  3. Multivitamin daily.  4. Fish oil daily.  5. Vitamin C daily.  6. Vitamin D daily.  7. Toprol 25 mg daily.  8. Aspirin 81 mg daily.  9. Benicar 20 mg daily.  10.Aciphex 40 mg daily.   PHYSICAL EXAMINATION:  He is alert, oriented gentleman in no acute  distress. The blood pressure was 102/60 and the pulse was 56. The weight  is 227 pounds down approximately 12 pounds from July of 2007.  The jugular veins are not distended.  The cardiac rhythm is regular.   The EKG reveals sinus bradycardia with left anterior fascicular block.  There is delayed R wave progression likely based on the fascicular  block.   Last chest x-ray of August 2007 revealed postoperatively small bilateral  effusions, but this was a previous chest x-ray. There is no clinical  evidence of this at the present time.   Echocardiography done in March of this year revealed estimate of the  ejection fraction in the range of 60% with hypokinesis of the periapical  wall. The aortic and mitral valves were intact and the right-sided  chamber sizes appeared to be normal, there was no significant  pericardial effusion. The aortic root was __________ normal in size.   IMPRESSION:  1.  Coronary artery disease with his history of left ventricular      dysfunction significantly improved following revascularization      surgery.  2. Hypercholesterolemia on lipid lowering therapy.  3. Non insulin dependent diabetes mellitus.  4. Anemia, historically resolved according to the patient.  5. Borderline chronic renal insufficiency.   RECOMMENDATIONS:  1. Continued follow up with Dr. Azalia Bilis.  2. Continue low dose aspirin.  3. Target LDL of 70 or less per Dr. Kenton Kingfisher.  4. Repeat echocardiography in March of 2009 to reassess overall left      function.     Loretha Brasil. Lia Foyer, MD, [facility]  Electronically Signed    TDS/MedQ  DD: 12/03/2006  DT: 12/04/2006  Job #: [id number]

## 2010-08-08 NOTE — Letter (Signed)
Jul 29, 2007    Azalia Bilis, M.D.  [address], [place] [address]   RE:  [name], [name]  MRN:  [id number]  /  DOB:  20-Feb-1936   Dear Dr. Kenton Kingfisher,   I had the pleasure of seeing nice patient, [name] in the office  today in follow-up.  To briefly summarize, this nice gentleman as she  know underwent revascularization surgery.  He has done remarkably well  since that time.  I see him on the treadmill at [employer].  He does  quite well.  He denies any chest pain or ongoing problems.  He tells me  that he has recently had laboratory studies in your office, including a  hemoglobin and lipid studies that there were all satisfactory.  He  exercises regularly without difficulty.  His last ejection fraction was  preserved, his aortic root was at the upper normal in size.   PHYSICAL EXAMINATION:  GENERAL:  Today on examination, he is alert and  oriented in no distress.  VITAL SIGNS:  Blood pressure was 123/68, pulse 49.  LUNG:  The lung fields were clear.  CARDIAC:  Rhythm is regular.  EXTREMITIES:  Reveal no edema.   Electrocardiogram demonstrated a normal sinus rhythm.  There was  leftward oriented axis and delayed in R-wave progression.   This very nice gentleman is doing well.  He is on lipid lowering agents,  Actos, low-dose beta blockers, and Benicar.  He also takes a daily  aspirin.  I am pleased with his overall progress.  Because he is so active, and is diabetic after bypass, we have  recommended that he have a routine treadmill to ensure safety with his  level of activity.  We will also repeat his 2-D echocardiogram.  I will  send those to you in follow-up.  If he does well with those, then we  will see him back again in 18 months time.  Thanks for allowing [place] to  share in his care.    Sincerely,      Loretha Brasil. Lia Foyer, MD, [facility]  Electronically Signed    TDS/MedQ  DD: 07/29/2007  DT: 07/29/2007  Job #: [id number]

## 2010-08-08 NOTE — Assessment & Plan Note (Signed)
[place] OFFICE NOTE   NAME:[name], [name] PREVAL                      MRN:          [id number]  DATE:04/27/2008                            DOB:          [birth year]    REASON FOR VISIT:  Chest pain with abnormal stress test.   HISTORY OF PRESENT ILLNESS:  [name] is a 75 year old gentleman who  was just seen on January 29 with complaints of epigastric and chest  discomfort.  His symptoms had occurred over an approximate 1 week, and  had both typical and atypical features for cardiac pain.  I felt there  was a high likelihood that this represented a gastric reflux or  recurrent ulcer disease.  However, in the setting of the patient's known  coronary artery disease, he was scheduled for a Myoview stress scan.  This was just completed this morning and it demonstrated good exercise  capacity, as he completed 10 minutes according to the Bruce protocol  achieving 11.4 METS.  The test was stopped due to shortness of breath.  He did not experience chest pain with exertion.  However, the images  demonstrated an apical infarction with a moderate-sized area of anterior  wall ischemia.  He also had frequent ectopy with ventricular couplets in  recovery.  He was brought in for further discussion.   Last week, he doubled his proton pump inhibitor and he has had improved  symptoms since that time.  He has no other new complaints.   MEDICATIONS:  1. Omeprazole 20 mg twice daily.  2. Metaglip 500/5 two twice daily.  3. Aspirin 81 mg daily.  4. Actos 45 mg daily.  5. Simvastatin 20 mg daily.  6. Multivitamin 1 daily.  7. Fish oil 1 g daily.  8. Toprol-XL 25 mg daily.  9. Benicar 20 mg daily.  10.Vitamin C.  11.Vitamin D.   ALLERGIES:  NKDA.   PHYSICAL EXAMINATION:  This was deferred today as he was just examined  last week and our time was spent in discussion.  His resting heart rate  was 65 beats per minute and  resting blood pressure was 133/68.   ASSESSMENT:  This is a 75 year old gentleman with type 2 diabetes and  previous coronary artery bypass surgery, who has new onset of epigastric  and chest burning with an exercise Myoview stress test showing anterior  wall ischemia as outlined above.  I think he probably has 2 separate  things present.  One is that there is clearly a component of  gastrointestinal contribution to his symptoms as he has improved with  increased proton pump inhibitors.  However, he clearly has an ischemic  Myoview scan in the setting of known carotid artery disease and diabetes  with prior coronary artery bypass, I think he clearly warrants repeat  cardiac catheterization.  Risks and indications were reviewed in detail  with the patient.  He agrees to proceed.  He will be scheduled tomorrow  with Dr. Lia Foyer, who is his primary cardiologist.  He was asked to hold  his metformin and Actos.   Further followup,  pending the results of his cardiac catheterization.     Juanda Bond. Burt Knack, MD  Electronically Signed    MDC/MedQ  DD: 04/27/2008  DT: 04/28/2008  Job #: [id number]   cc:   Loretha Brasil. Lia Foyer, MD, [facility]  Azalia Bilis, M.D.

## 2010-08-08 NOTE — Assessment & Plan Note (Signed)
[place] OFFICE NOTE   NAME:[name], [name] MULROY                      MRN:          [id number]  DATE:04/23/2008                            DOB:          09/08/1935    REASON FOR VISIT:  Chest pain.   HISTORY OF PRESENT ILLNESS:  [name] is a 75 year old gentleman with  coronary artery disease and prior bypass surgery.  He underwent  multivessel CABG in 2007 when he was found to have severe multivessel  coronary artery disease.  He has done well in the interim.  He underwent  exercise treadmill study in June 2009 where he was able to exercise over  10 minutes according to the Bruce protocol and he had no significant ST  changes at maximum exercise.  Over the last 5-7 days, he has developed  near constant chest discomfort from the epigastrium up to the upper  chest.  It feels like a burning pain.  His symptoms have been  unchanged with exercise on the treadmill two times in the last week.  When he goes to bed at night his symptoms abate, but when he wakes up in  the morning, he feels the same sensation.  He is taking antacids with  some degree of improvement, but he continues with persistent symptoms.  He has also remained on once daily omeprazole.  He has had no associated  symptoms of dyspnea, orthopnea, PND, palpitations, or edema.   CURRENT MEDICATIONS:  1. Omeprazole 20 mg daily.  2. Aspirin 81 mg daily.  3. Actos 45 mg daily.  4. Simvastatin 20 mg daily.  5. Multivitamin one daily.  6. Fish oil one daily.  7. Vitamin C 500 mg daily.  8. Vitamin D.  9. Toprol-XL 25 mg daily.  10.Benicar 20 mg daily.   ALLERGIES:  NKDA.   PHYSICAL EXAMINATION:  GENERAL:  The patient is alert and oriented.  He  is in no distress.  VITAL SIGNS:  Weight is 233 pounds.  Blood pressure is 130/72, heart  rate 58.  HEENT:  Normal.  NECK:  Normal carotid upstrokes with soft bilateral carotid bruits.  JVP  normal.  LUNGS:   Clear bilaterally.  HEART:  Regular rate and rhythm.  No murmurs or gallops.  ABDOMEN:  Soft, nontender.  There is no tenderness in the epigastrium or  right upper quadrant.  Bowel sounds are positive.  EXTREMITIES:  No clubbing, cyanosis, or edema.   EKG shows sinus rhythm with an age indeterminate anteroseptal infarct  and left axis deviation, unchanged from previous tracing.  There are no  significant ST or T-wave changes.   ASSESSMENT:  This is a 75 year old gentleman with underlying coronary  artery disease and previous coronary bypass surgery with a prolonged  episode of chest discomfort.  Based on his history and unchanged EKG, I  would think this is likely gastrointestinal in nature.  It may be  related to gastroesophageal reflux disease versus problems with  recurrent peptic ulcer disease.  He has a history of peptic ulcer  disease and the iron deficiency anemia.  We will check a CBC to rule out  recurrent anemia.  I think we should proceed with exercise Myoview  stress scan to evaluate for myocardial ischemia.  This will be scheduled  for next week.  In the meantime, I have asked him to double up on his  omeprazole to 20 mg twice daily.  There were no changes made to his  cardiac medicines.  We will follow up with the patient after his Myoview  results are available.  He will keep his regular scheduled followup with  [name].     Juanda Bond. Burt Knack, MD  Electronically Signed    MDC/MedQ  DD: 04/23/2008  DT: 04/24/2008  Job #: [id number]   cc:   Loretha Brasil. Lia Foyer, MD, [facility]  Azalia Bilis, M.D.

## 2010-08-08 NOTE — Cardiovascular Report (Signed)
NAMEBRONX, MCINDOE               ACCOUNT NO.:  [id number]   MEDICAL RECORD NO.:  [id number]          PATIENT TYPE:  OIB   LOCATION:  2899                         FACILITY:  [facility]   PHYSICIAN:  Loretha Brasil. Lia Foyer, MD, FACCDATE OF BIRTH:  November 30, 1935   DATE OF PROCEDURE:  04/28/2008  DATE OF DISCHARGE:  04/28/2008                            CARDIAC CATHETERIZATION   INDICATIONS:  [name] is a very delightful gentleman well known to  me.  He has previously had revascularization surgery in 2007.  Recently,  he had some atypical chest pain, and was seen by Dr. Burt Knack.  Radionuclide imaging associated with exercise revealed 10 minutes of  exercise without definite EKG changes.  However, there was a defect in  the anterior segment with some redistribution at the apex.  As a result,  a cardiac catheterization was recommended.  Risks, benefits, and  alternatives were discussed with the patient.  He was seen by Dr. Burt Knack  yesterday and set up.  His creatinine was normal today.   PROCEDURE:  1. Placement of catheters for arteriography.  2. Selective coronary arteriography.  3. Saphenous vein graft angiography.  4. Selective internal mammary angiography.   DESCRIPTION OF PROCEDURE:  The patient was brought to the cath lab and  prepped and draped in the usual fashion.  Through anterior puncture, the  right femoral artery was easily entered.  A 5-French sheath was then  placed.  Following this, views of the left and right coronary arteries  were obtained.  We then performed shots of the vein grafts well as the  internal mammary.  An IMA catheter was used to best inject the IMA.  We  tried to cross the aortic valve, but this was somewhat difficult.  We do  know from the previous echocardiography data that it does not have tight  aortic stenosis.  As a result, we elected not to upgrade to a 6-French  system in order to cross the valve.  All catheters were subsequently  removed and the patient  was taken to the holding area for sheath  removal.   HEMODYNAMIC DATA:  Initial central aortic pressure is 172/76, the mean  was 113.   ANGIOGRAPHIC DATA:  1. The left main is free of critical disease.  2. The LAD has 70% narrowing and is calcified.  It leads into a      smaller diagonal with about 60% mid narrowing.  The mid LAD is      totally occluded.  There is probably 50% involvement of the large      septal perforator.  3. The internal mammary to the LAD is widely patent.  However, the LAD      distally itself is a very small-caliber vessel, nearly pencil thin      and suggestive of diffuse diabetic disease.  4. The circumflex has 80% narrowing, is diffusely diseased and totally      occluded.  5. The saphenous vein graft has at least 2 valves, but appears to be      widely patent inserting into the obtuse marginal system.  The OM  system was without a critical focal narrowing, but does have      somewhat diabetic appearance.  6. The right coronary artery is moderately diseased throughout.  There      is 30% narrowing proximally and 90% mid narrowing.  There is      diffuse luminal irregularity throughout.  There is competitive      filling of the PDA and diffuse disease after this leading into the      posterolateral system with at least 80% narrowing.  There is some      filling of the distal vessel through the native circulation.  7. The saphenous vein graft to the PDA and PLA is intact and      retrograde fills the RCA system.   CONCLUSION:  1. Continued patency of the internal mammary to the LAD.  2. Continued patency of the saphenous vein graft to the OM.  3. Continued sequential saphenous vein graft to the PDA and PLA.   Importantly, the current study demonstrates continued graft patency.  I  am pleased with the angiographic results.  I suspect the ischemia could  be due to the diffuse nature of his distal LAD.  The diagonal could be  involved, but the LAD is  pretty heavily calcified, and I would not be  inclined to dilate this for the anterolateral segment at the present  time.  We will see him in close followup and allow him to return to his  activity.      Loretha Brasil. Lia Foyer, MD, [facility]  Electronically Signed     TDS/MEDQ  D:  04/28/2008  T:  04/29/2008  Job:  [id number]

## 2010-08-08 NOTE — Op Note (Signed)
[name], [name]               ACCOUNT NO.:  [id number]   MEDICAL RECORD NO.:  [id number]          PATIENT TYPE:  AMB   LOCATION:  NESC                         FACILITY:  [facility]   PHYSICIAN:  Bernestine Amass, M.D.  DATE OF BIRTH:  February 13, 1936   DATE OF PROCEDURE:  DATE OF DISCHARGE:                               OPERATIVE REPORT   PREOPERATIVE DIAGNOSIS:  Phimosis.   POSTOPERATIVE DIAGNOSIS:  Phimosis.   PROCEDURE PERFORMED:  Circumcision.   SURGEON:  Dr. Risa Grill.   ANESTHESIA:  IV sedation with penile block.   INDICATIONS:  [name] has had some longstanding problems with  phimosis and is unable to retract his foreskin.  Clinically there was no  balanitis but severe phimosis.  The pros and cons of circumcision were  discussed and full informed consent obtained.   TECHNIQUE AND FINDINGS:  A nice the patient was brought to the operating  room.  He received IV sedation and monitoring by anesthesia services.  Appropriate time-out was performed.  The penis was prepped and draped in  normal manner.  Penile block was performed with a combination of  Marcaine and lidocaine.  A circumferential incision was made behind the  glans penis.  A dorsal slit needed to be made in the foreskin in order  to retract the foreskin and expose the mucosal collar.  This area was  reprepped.  A second incision was made through the mucosal collar.  Skin  edges were then reapproximated after removal of the sleeve of redundant  foreskin.  A 4-0 Vicryl suture was used in an interrupted manner.  A  clear plastic dressing was applied.  The patient appeared to tolerate  the procedure well and as brought to the recovery room in stable  condition.      Bernestine Amass, M.D.  Electronically Signed     DSG/MEDQ  D:  10/20/2008  T:  10/20/2008  Job:  [id number]

## 2010-08-11 NOTE — Assessment & Plan Note (Signed)
[place] OFFICE NOTE   NAME:[name], [name]                      MRN:          [id number]  DATE:05/28/2008                            DOB:          1935/05/15    Mr. Payant is in for followup exercise.  He is doing well.  He is not  having significant chest pain.  The patient had an abnormal Myoview, and  subsequently underwent cardiac catheterization.  The results of the  catheterization revealed continued patency of the internal mammary,  continued patency of the SVG to the OM, and continued sequential  saphenous vein graft patency of the PDA and PLA.  The ischemia was  thought to possibly be due to the distal diffuse nature of his LAD.  However, we did not think that any intervention was necessary at the  present time.  Since discharge from the hospital, he has done pretty  well.   MEDICATIONS:  1. Omeprazole 40 mg daily.  2. Metaglip 500/5 two tablets b.i.d.  3. Aspirin 81 mg daily.  4. Actos 45 mg daily.  5. Simvastatin 20 at bedtime.  6. Multivitamin.  7. Fish oil.  8. Vitamin C.  9. Vitamin D.  10.Toprol-XL 25 mg daily.  11.Benicar.   On physical, blood pressure is 118/68, the pulse is 51, the weight is  236 pounds.  Lung fields are clear and the cardiac rhythm currently is  regular.   The electrocardiogram demonstrates sinus bradycardia, rate of 51,  otherwise unremarkable.  Compared to previous EKGs, it is fairly  similar.  Rates have been 58 and 53.  There is not a substantial change.   IMPRESSION:  1. Coronary artery disease with history of left ventricular function      improved following revascularization surgery.  2. Hypercholesterolemia on lipid-lowering therapy.  3. Non-insulin-dependent diabetes mellitus.  4. Anemia resolved according to the patient.  5. Borderline chronic renal insufficiency.  Currently, stable at the      time of catheterization.   PLAN:  1. Return to clinic  in 3-6 months.  2. Continue current medical regimen.  3. Continued followup with the patient's primary physician.     Loretha Brasil. Lia Foyer, MD, [facility]  Electronically Signed    TDS/MedQ  DD: 06/06/2008  DT: 06/07/2008  Job #: [id number]   cc:   Azalia Bilis, M.D.

## 2010-08-11 NOTE — Assessment & Plan Note (Signed)
[place] OFFICE NOTE   NAME:[name], [name]                      MRN:          [id number]  DATE:11/13/2005                            DOB:          [birth year]    This is a patient of Dr. Addison Lank and Dr. Cristopher Peru.   This is a 75 year old, married, white male patient with a history of  coronary artery disease who is status post CABG x4 on October 26, 2005.  The  patient did have an ejection fraction of 28% pre-op.  The patient also had  atrial fibrillation post-op and was started on amiodarone therapy.  His  Benicar was stopped in the hospital due to low blood pressures.  Since he  has been home, he is doing quite well.  His main complaint is swelling of  his feet.  He denies any significant dyspnea on exertion, orthopnea,  paroxysmal nocturnal dyspnea.  He is walking up stairs now and has no  shortness of breath but does complain of a slight cough.   CURRENT MEDICATIONS:  1. Metformin 1,000 mg b.i.d.  2. Actos 45 mg daily.  3. Glipizide 10 mg daily.  4. Simvastatin 20 mg daily.  5. Benicar is on hold.  6. Multivitamin daily.  7. Fish oil daily.  8. Vitamin C 500 mg.  9. Vitamin D two daily.  10.Amiodarone 200 mg b.i.d.  11.Toprol XL 25 mg daily.  12.Aspirin 325 mg daily.   PHYSICAL EXAMINATION:  GENERAL:  This is a pleasant 75 year old white male  in no acute distress.  VITAL SIGNS:  Blood pressure 112/66, pulse 65, weight 246.  NECK:  Without JVD, __________ , bruit, or thyroid enlargement.  LUNGS:  Decreased breath sounds at the bilateral bases.  Clear elsewhere.  HEART:  Regular rate and rhythm at 65 beats per minute.  Normal S1 and S2.  There is a 1/6 systolic murmur at the left sternal border.  Distant heart  sounds.  Incision is healing well.  ABDOMEN:  Normoactive bowel sounds are heard throughout.  Abdomen is soft  without organomegaly, masses, lesions, or abnormal tenderness.  EXTREMITIES:  With +2 edema bilaterally.  Positive distal pulses.   EKG:  Sinus rhythm with first degree AV block, low voltage, poor R wave  progression.   IMPRESSION:  1. Ischemic cardiomyopathy, status post coronary artery bypass graft x4 on      October 26, 2005.  2. Postoperative atrial fibrillation, converted to normal sinus rhythm, on      amiodarone.  3. Severe left ventricular dysfunction, ejection fraction 28%, to see Dr.      Lovena Le January 09, 2006 to discuss defibrillator.  4. Fluid overload.  Will resume Lasix.  5. Hypertension, treated.  6. Type 2 diabetes mellitus, treated.  7. Hyperlipidemia, treated.  8. Previous history of tobacco abuse.  9. A 40-60% right internal carotid artery stenosis, asymptomatic.   At this time, I have given him a prescription for Lasix 20 mg daily as well  as resuming his Benicar at 20 mg daily which is lower than  his 40 mg dose  that he had been taking for his LV dysfunction.  We will check a chest x-ray  today.  Hopefully, the Lasix will reduce his edema.   He is scheduled to see the surgeons back next week, followed by echo on  December 25, 2005; and Dr. Lovena Le on January 09, 2006 to discuss possible  AICD.  He will see Dr. Johnsie Cancel back in 1 month as well.                                  Ermalinda Barrios, PA-C                            Carlena Bjornstad, MD, [facility]   ML/MedQ  DD:  11/13/2005  DT:  11/13/2005  Job #:  [id number]

## 2010-08-11 NOTE — Consult Note (Signed)
NAMESHAZIL, HILAIRE               ACCOUNT NO.:  [id number]   MEDICAL RECORD NO.:  [id number]          PATIENT TYPE:  INP   LOCATION:  2027                         FACILITY:  [place]   PHYSICIAN:  Gilford Raid, M.D.     DATE OF BIRTH:  10/14/1935   DATE OF CONSULTATION:  10/25/2005  DATE OF DISCHARGE:                                   CONSULTATION   REASON FOR CONSULTATION:  Severe 3-vessel coronary artery disease.   HISTORY OF PRESENT ILLNESS:  I was asked to evaluate this 75 year old  gentleman for consideration of coronary artery bypass surgery by Dr. Bing Quarry.  He had no prior history of heart disease, but does have multiple  cardiac risk factors, including a long history of diabetes, hyperlipidemia,  hypertension, and a positive family history of heart disease.  He presented  with about a 82-month history of episodic shortness of breath with associated  mild chest pain.  He has been very active up until about 5 weeks ago,  working out 3-4 days per week at [employer].  He said that he was able to  exercise on a treadmill and on a bicycle for about 30-40 minutes at a time,  but suddenly noticed that his energy level was much decreased.  He was  referred by Dr. Kenton Kingfisher to [facility] and saw Dr. Johnsie Cancel.  He  underwent an echocardiogram, which showed mild to moderate left ventricular  dysfunction with akinesis of the apex, as well as the basal, mid inferior,  and posterior wall.  He subsequently underwent a Cardiolite scan that showed  an ejection fraction of 28% with a fixed anterior apical and inferior basal  defect.  He apparently had a drop in blood pressure during exercise, and it  was felt that his Cardiolite scan was a high risk, and he underwent cardiac  catheterization yesterday, which showed severe 3-vessel disease.  The LAD  had 70-80% proximal stenosis.  There was a small diagonal branch.  There was  a large septal perforator that had about 70-80% proximal  stenosis.  The LAD  was occluded after the septal perforator with faint filling of the distal  vessel by bridging collaterals.  This LAD wrapped around the apex.  The left  circumflex gave off a single large marginal branch that had sequential 80,  90, and 90% proximal to mid stenoses.  The right coronary artery was a large  dominant vessel with 40% proximal and 90% mid vessel stenosis.  There was a  large posterior descending branch that had 70% proximal stenosis.  There was  a smaller, diffusely diseased posterolateral, and then a moderate-sized  posterolateral after that with an 80% stenosis.  Left ventriculogram was not  performed.  There was no gradient across the aortic valve.  His  echocardiogram that had been done on October 23, 2005, showed the left  ventricle to be mildly dilated.  Overall left ventricular systolic function  was mildly to moderately decreased with severe hypokinesis to akinesis of  the basal mid inferoposterior wall.  There was apical akinesis and mild  septal hypertrophy.  There was mild to moderate mitral annular calcification  with trivial mitral regurgitation.  Electrocardiogram showed normal sinus  rhythm with low voltage QRS.  There was a possible inferior infarct and a  possible anteroseptal infarct of undetermined age.   REVIEW OF SYSTEMS:  GENERAL:  He denies any fever or chills.  Has had no  recent weight changes.  He has had fatigue for about the past month.  EYES:  Negative.  ENT:  Negative.  ENDOCRINE:  He has a long history of normal  insulin diabetes, management medically.  His last hemoglobin A1c was around  7.  He denies hypothyroidism.  CARDIOVASCULAR:  As above, he has had mild  exertional chest pain, as well as shortness of breath.  He has had some  episodes at rest.  He denies PND and orthopnea.  Denies peripheral edema.  He said that he did have a chest x-ray a few weeks ago that showed a pleural  effusion, and he was treated with diuretics  with resolution.  RESPIRATORY:  He denies cough and sputum production.  GI:  He has had no nausea or  vomiting.  He denies melena and bright red blood per rectum.  GU:  He denies  dysuria and hematuria.  MUSCULOSKELETAL:  He denies arthritis and myalgias.  NEUROLOGICAL:  He denies any focal weakness or numbness.  He denies  dizziness and syncope.  ALLERGIES:  None.  HEMATOLOGICAL:  Negative.   PAST MEDICAL HISTORY:  Significant for diabetes for about 20 years.  He does  have a history of hyperlipidemia and hypertension that are controlled,  according to the patient.  His only previous surgery was a tonsillectomy in  1955.   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  He is married and lives with his wife.  He has 1 son who is disabled and in  a wheelchair.  He smoked 1-2 packs per day for 35 years, but quit in 1972.   FAMILY HISTORY:  His mother died at age 66 with diverticular disease.  His  father died at 26 of massive myocardial infarction.  He had no siblings.   MEDICATIONS PRIOR TO ADMISSION:  1. Metformin 1000 b.i.d.  2. Actos 45 mg daily.  3. Glipizide 10 mg daily.  4. Simvastatin 20 mg daily.  5. Benicar 40 mg daily.  6. Aspirin 81 mg daily.  7. Multivitamin daily.  8. Fish oil daily.  9. Vitamin C 500 mg daily.  10.Vitamin D 2 per day.   PHYSICAL EXAMINATION:  VITAL SIGNS:  His blood pressure is 136/68.  His  pulse is 75 and regular.  Respiratory rate is 16, nonlabored.  GENERAL:  He is a well-developed white male in no distress.  HEENT EXAM:  Normocephalic and atraumatic.  The pupils are equal and  reactive to light and accommodation.  Extraocular muscles are intact.  His  throat is clear.  NECK EXAM:  Normal carotid pulses bilaterally.  There are no bruits.  There  is no adenopathy or thyromegaly.  CARDIAC EXAM:  Regular rate and rhythm with normal S1 and S2.  There is no  murmur, rub or gallop.  LUNGS:  Clear. ABDOMINAL EXAM:   Active bowel sounds.  His abdomen is soft and nontender.  There are no palpable masses or organomegaly.  EXTREMITY EXAM:  No peripheral edema.  Pedal pulses are palpable  bilaterally.  SKIN:  Warm and dry.  NEUROLOGIC EXAM:  Alert and oriented  x3.  Motor and sensory exam is grossly  normal.   Carotid Doppler examination shows a 40-60% right internal carotid artery  stenosis that is felt to be at the upper end of the scale and no significant  left internal carotid artery stenosis.  Vertebral flow is antegrade  bilaterally.  His ABI is 1.0 bilaterally.  Laboratory examination show  normal electrolytes with a glucose of 139, BUN of 18, creatinine 1.3,  albumin 3.9.  Liver function profile is normal.  Coagulation profile normal.  Hemoglobin 14.6, white blood cell count 8.3, and platelet count 142,000.   IMPRESSION:  Mr. Sorrento has severe 3-vessel coronary artery disease with  moderate left ventricular dysfunction.  I agree that coronary artery bypass  graft surgery is the best treatment for this patient.  I discussed the  operative  procedure with the patient and his daughter, including alternatives,  benefits and risks, including bleeding, blood transfusion, infection,  stroke, myocardial infarction, graft failure, and death.  They understand  and agree to proceed.  We will plan to do this tomorrow afternoon.      Gilford Raid, M.D.  Electronically Signed     BB/MEDQ  D:  10/25/2005  T:  10/26/2005  Job:  [id number]   cc:   Azalia Bilis, M.D.

## 2010-08-11 NOTE — Cardiovascular Report (Signed)
NAMEKAVI, UNZICKER               ACCOUNT NO.:  [id number]   MEDICAL RECORD NO.:  [id number]          PATIENT TYPE:  OIB   LOCATION:  NA                           FACILITY:  [facility]   PHYSICIAN:  Loretha Brasil. Lia Foyer, M.D. [facility] OF BIRTH:  1935/10/30   DATE OF PROCEDURE:  10/24/2005  DATE OF DISCHARGE:                              CARDIAC CATHETERIZATION   INDICATIONS:  Mr. Brehmer was a very delightful 75 year old gentleman who was  recently presented with heart failure symptoms.  The patient had recent  shortness of breath as well as chest pain.  He was seen by Dr. Johnsie Cancel in  consultation, and Cardiolite imaging revealed significant defects in the  apical segment as well as the inferobasal segment.  Most of these with  persistent.  Ejection fraction was 28%.  Significant reduction in left  ventricular function by 2-D echocardiography was also suggested, and with  this, cardiac catheterization was felt to be indicated.  The current study  was done to assess coronary anatomy.  Of note, the patient's BUN and  creatinine were mildly elevated at 27 and 1.5, although his creatinine  clearance based on his size and age was relatively preserved.  He was given  about 200 mL of normal saline prior to the procedure.   PROCEDURE:  1.  Selective coronary arteriography.  2.  Subclavian angiography.  3.  Left heart catheterization.   DESCRIPTION OF PROCEDURE:  The patient was brought to the catheterization  laboratory, prepped and draped in usual fashion.  Through an anterior  puncture, a 4-French sheath was placed.  The central aortic and left  ventricular pressures were then measured with a pigtail.  Following pressure  pullback the pigtail catheter was removed.  We specifically did not do  ventriculography in order to reduce contrast load.  Views of left and right  coronary arteries were obtained.  A subclavian shot was obtained as well.  Total of 38 mL of contrast was utilized for imaging.   There were no  complications.  I reviewed the films with his wife in the holding area.   HEMODYNAMIC DATA:  1.  Central aortic pressure 148/78, mean 108.  2.  Left ventricular pressure 142/32.  3.  No gradient pullback across aortic valve.   ANGIOGRAPHIC DATA.:  1.  There is general calcification of the distal left main and proximal LAD      and circumflex.  There also appears to be mitral annular calcification      as well as mid-LAD calcification.  2.  The left main is free of critical disease.  3.  The LAD demonstrates segmental plaquing in the proximal portion.  This      would measure probably about 70-80% luminal reduction with a fairly      focal lesion.  The vessel then is somewhat diffusely diseased, and      beyond the septal perforating vessel, the vessel was more of less      totally occluded.  The septal perforator itself has proximal 80%      narrowing.  The diagonal is segmentally diseased in  its proximal      portion.  The distal vessel was of moderate size and may be suitable for      grafting.  The LAD distally, appears to be somewhat diffusely diseased      compatible with a diabetic appearing artery.  4.  The circumflex consists predominantly of one major large marginal      branch.  There is probably about 80% eccentric calcified plaque      proximally followed by tandem lesions of 90 and 90%.  The vessel beyond      this appears to be reasonable for grafting, although itself appears to      be a diabetic appearing artery.  5.  The right coronary artery is a large-caliber vessel.  There is probably      30% narrowing proximally then a focal 90% stenosis in the mid vessel      that is eccentric.  The PDA has segmental plaquing of probably 70% in      its proximal portion.  The continuation branch provides a segmentally      and small caliber posterolateral branch then proceeds into a second      posterolateral branch which has segmental plaquing of 70-80%.   As  noted, ventriculography was not performed.   Subclavian angiography reveals a patent subclavian artery with what appears  to be vigorous flow down the internal mammary.   CONCLUSIONS:  1.  Elevated left ventricular and diastolic pressure compatible with left      ventricular dysfunction by echocardiography and radionuclide imaging.  2.  Severe triple-vessel coronary artery disease with total occlusion of the      LAD, multiple tandem lesions of the circumflex, multiple areas of      disease in the right coronary artery and previously noted left      ventricular dysfunction.   PLAN:  We plan to go ahead and get a cardiac surgery consultation.  The  patient has severe multivessel coronary artery disease and is of high risk  for cardiac events.  Electrophysiologic consultation will eventually be  needed as he will probably likely need AICD implantation in the long run.      Loretha Brasil. Lia Foyer, M.D. [facility]  Electronically Signed     TDS/MEDQ  D:  10/24/2005  T:  10/24/2005  Job:  [id number]   cc:   Azalia Bilis, M.D.  Jenkins Rouge, M.D.

## 2010-08-11 NOTE — Procedures (Signed)
[facility]                                EXERCISE TREADMILL   NAME:[name], [name]                      MRN:          [id number]  DATE:12/25/2005                            DOB:          1935/07/17    [name] exercised today on the [employer].  Peak heart rate reached  122, 80% of age predicted maximum.  He stopped due to volitional fatigue.  Blood pressure rose appropriately.  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  At maximum stress, there  was no significant ST depression.  The study demonstrates no inducible  evidence of ischemia by routine standard exercise treadmill testing.   This gentleman has recovered from revascularization surgery and appears to  be doing well and on amend.  His BUN and creatinine is mildly elevated.  We  have stopped his Lasix.  His hemoglobin is also slightly below what it was  at the time of his original surgery, and we have started him on iron.  We  plan to have him return in approximately two weeks for follow up, and then I  will return him to his primary care doctor, Dr. Azalia Bilis.  He will  remain cardiac rehabilitation at the present time.       Loretha Brasil. Lia Foyer, MD, [facility]      TDS/MedQ  DD:  12/25/2005  DT:  12/26/2005  Job #:  [id number]   cc:   Azalia Bilis, M.D.

## 2010-08-11 NOTE — Assessment & Plan Note (Signed)
[place] OFFICE NOTE   NAME:[name], [name]                      MRN:          [id number]  DATE:10/19/2005                            DOB:          1936-02-20    [name] is a delightful, 75 year old patient, referred for chest pain and  shortness of breath.  He has had diabetes for over 20 years.  He has family  history of coronary disease and hypertension.   A few weeks ago, he had an episode of shortness of breath and chest pain.  This occurred after he had been out at a birthday party.  He had more  alcohol than he usually does.  The pain was non-anginal-sounding, but he did  sound like he may have had some congestive heart failure.   The patient was treated with diuretics with some improvement.  His chest x-  ray did show cardiomegaly with small effusion.   Since that time, he has had a cough.  It may actually be related to his  Benicar or ACE inhibitor.   There has been no PND, orthopnea or lower extremity edema.   The patient is fairly active.  He works out at [employer] at the [employer].   He does indicate some chest pain with exercise, so he was given  nitroglycerin by Dr. Kenton Kingfisher and has not used any.   PAST MEDICAL HISTORY:  The patient's past medical history is remarkable for  diabetes.   He quit smoking in 1972.   His primary activity is walking on the treadmill at the club.  He is still  active, working in the [employer], and travels quite a bit.  He  presents company products to down-stream customers.   He has had a tonsillectomy in 1955.   MEDICATIONS:  1.  His medications are numerous and include metformin 1 g twice daily.  2.  Actos 45 a day.  3.  Glipizide 10 a day.  4.  Simvastatin 20 a day.  5.  Benicar 40 a day.  6.  An aspirin a day.  7.  Lasix 20 a day.  8.  Albuterol as needed.  9.  NitroQuick as needed.   His EKG from Dr. Kenton Kingfisher' office showed poor  R-wave progression and  nonspecific STT-wave changes.   PHYSICAL EXAMINATION:  GENERAL:  He looks well.  VITAL SIGNS:  Blood pressure is 110/70, pulse 70 and regular.  LUNGS:  Clear.  CAROTIDS:  No murmurs.  CARDIOVASCULAR:  S1, S2, distant heart sounds.  ABDOMEN:  Benign.  LOWER EXTREMITIES:  Intact pulses, no edema.   IMPRESSION:  The patient certainly has at least an intermediate probability  of having a heart problem.   I think from a prognostic standpoint, doing a stress Myoview and an echo  would be important.  I think there is a fairly high likelihood that he will  need to go on to have heart catheterization.  I explained to the patient,  that given his risk factors and diabetes, he certainly could have  multivessel disease.  They are in agreement that they would like to pursue a noninvasive course  first.  He is on good medication.  I think I would probably switch him to an  ARB, since his cough may be exacerbated by the ACE inhibitor.   Further recommendations will be based on the results of his Myoview and  echo.  The patient would like to expedite his workup.  His wife, who is with  him today, has fairly extensive back surgery coming up in early September at  [facility].  I told them we would try to work through these issues, including  catheterization, if needed, as soon as possible.                               Wallis Bamberg. Johnsie Cancel, MD, [facility]    PCN/MedQ  DD:  10/19/2005  DT:  10/19/2005  Job #:  [id number]   cc:   Azalia Bilis, MD

## 2010-08-11 NOTE — Op Note (Signed)
[name], RISTINE               ACCOUNT NO.:  [id number]   MEDICAL RECORD NO.:  [id number]          PATIENT TYPE:  INP   LOCATION:  2305                         FACILITY:  [facility]   PHYSICIAN:  Gilford Raid, M.D.     DATE OF BIRTH:  07-Jun-1935   DATE OF PROCEDURE:  10/26/2005  DATE OF DISCHARGE:                                 OPERATIVE REPORT   PREOPERATIVE DIAGNOSIS:  Severe three-vessel coronary disease.   POSTOPERATIVE DIAGNOSIS:  Severe three-vessel coronary disease.   OPERATIVE PROCEDURE:  Median sternotomy, extracorporeal circulation,  coronary artery bypass graft surgery x4 using a left internal mammary artery  graft to the left anterior descending coronary, with a saphenous vein graft  to the obtuse marginal branch of left circumflex coronary artery, and a  sequential saphenous vein graft to the posterior descending and  posterolateral branches of the right coronary artery.  Endoscopic vein  harvesting from the right leg.   ATTENDING SURGEON:  Gilford Raid, M.D.   ASSISTANT:  Lanelle Bal, M.D.   SECOND ASSISTANT:  Suzzanne Cloud, P.A.-C.   ANESTHESIA:  General endotracheal.   CLINICAL HISTORY:  This patient is 75 year old gentleman with long history  of diabetes and hypertension as well as hypercholesterolemia and a strong  family history of heart disease who presented with a 1 month history of  episodic chest pain and shortness of breath.  He had a Cardiolite scan  showing an ejection fraction of 28% with a fixed anterior apical and  inferior basal defect.  This was felt to be a high-risk study, and he  subsequently underwent cardiac catheterization on October 24, 2005 by Dr.  Lia Foyer which showed severe three-vessel disease.  The LAD had 70-80%  proximal stenosis, and then it was completely occluded after a large septal  perforator.  There were faint bridging collaterals to the distal vessel.  It  appeared to be a relatively long vessel that wrapped around the  apex but had  a small caliber.  There was a small diagonal branch.  The left circumflex  had a single large marginal branch that had sequential 80, 90, and 90%  stenoses proximally.  The right coronary artery was a large dominant vessel  and had 90% mid vessel stenosis.  There was also about 70% proximal stenosis  in the posterior descending branch, and there was a posterolateral that had  about 80% stenosis.  There was no gradient across the aortic valve.  Left  ventriculogram was not done due to diabetes and elevated creatinine level of  1.4.  After review of the angiogram and examination of the patient, it was  felt that coronary bypass graft surgery was the best treatment to prevent  further ischemia and infarction.  I discussed the operative procedure with  the patient and his daughter including alternatives, benefits, and risks  including bleeding, blood transfusion, infection, stroke, myocardial  infarction, graft failure, and death.  They understood and agreed to  proceed.   OPERATIVE PROCEDURE:  The patient was taken to the operating room and placed  on the table in supine position.  After induction  of general endotracheal  anesthesia, a Foley catheter was placed in the bladder using sterile  technique.  Then, the chest, abdomen and both lower extremities were prepped  and draped in usual sterile manner.  The chest was entered through a median  sternotomy incision and the pericardium opened in the midline.  Examination  of heart showed good right ventricular contraction.  The anterior wall of  the left ventricle appeared to be mostly scar, although there was some  patchy muscle seen.  This area was somewhat thinned out.  The ascending  aorta had no palpable plaques in it.   Then, the left internal mammary artery was harvested from the chest wall as  a pedicle graft.  This was a medium caliber vessel with excellent blood flow  through it.  At the same time, a segment of greater  saphenous vein was  harvested from the right leg using endoscopic vein harvest technique.  This  vein was of medium size and good quality.   The patient was then heparinized, and when an adequate clotting time was  achieved, the distal ascending aorta was cannulated using a 20-French aortic  cannula for arterial inflow.  Venous outflow was achieved using a two-stage  venous cannula for the right atrial appendage.  An antegrade cardioplegia  and vent cannula were inserted in the aortic root.   The patient was then placed on cardiopulmonary bypass and the distal  coronary arteries identified.  The LAD was diffusely diseased but was felt  to be graftable distally.  There was a small diagonal branch which was not  suitable size to graft.  There was a large obtuse marginal that was diseased  proximally, but the distal portion vessel appeared relatively free of  disease.  There was also a second small distal marginal that was not  graftable and was diffusely diseased.  The right coronary gave off a large  posterior descending branch that was fairly free of disease.  There was a  small to medium sized first posterolateral branch.  There are two other more  distal posterolateral branches which was too small to graft.   Then, a retrograde cardioplegic cannula was inserted through the right  atrium and coronary sinus.  The aorta was then cross clamped, and 500 mL of  cold blood antegrade cardioplegia was administered in the aortic root with  quick arrest the heart.  This was followed by 300 mL of cold blood  retrograde cardioplegia.  This resulted in good myocardial cooling with a  septal temperature measured at 10 degrees centigrade.  Systemic hypothermia  to 28 degrees centigrade and topical hypothermic ice saline were used.   The first distal anastomosis was then performed to the posterior descending  coronary artery.  The internal diameter was about 1.75 mm.  Conduit used was a segment of  greater saphenous vein and anastomosis performed in a  sequential side-to-side manner using continuous 7-0 Prolene suture.  Flow  was noted through the graft and was excellent.   Second distal anastomosis was performed of the first posterolateral branch.  The internal diameter was 1.6 mm.  Conduit used was the same segment of  greater saphenous vein and anastomosis performed in a sequential end-to-side  manner using continuous 7-0 Prolene suture.  Flow noted through the graft  was excellent.  Then, a dose of cardioplegia was given down vein graft and  in the aortic root.   The third distal anastomosis was then performed to the obtuse marginal  branch.  The internal diameter was about 2 mm.  Conduit used was a second  segment of greater saphenous vein and anastomosis performed in end-to-side  manner using continuous 7-0 Prolene suture.  Flow was noted through the  graft and was excellent.  Then, a dose of retrograde cardioplegia was given.   The fourth distal anastomosis was then performed to the distal LAD.  The  internal diameter distally was about 1.6 mm.  A 1.5-mm probe could not pass  proximally, but a 1-mm probe would.  The conduit used was a left internal  mammary graft, and this was brought through an opening in the left  pericardium anterior to the phrenic nerve.  It was anastomosed to the LAD in  an end-to-side manner using continuous 8-0 Prolene suture.  The pedicle was  sutured to the epicardium with 6-0 Prolene sutures.  The patient was  rewarmed to 37-degrees centigrade.  With the crossclamp in place, the two  proximal vein graft anastomosis were performed to the aortic root in an end-  to-side manner using continuous 6-0 Prolene suture.  Then, the clamp was  removed from the mammary pedicle.  There was rapid warming of ventricular  septum and return of spontaneous ventricular fibrillation.  The crossclamp  was removed at a time of 76 minutes and the patient defibrillated to  sinus  rhythm.   The proximal and distal anastomosis appeared hemostatic and the grafts  satisfactory.  Graft markers were placed around the proximal anastomoses.  Two temporary right ventricular and right atrial pacing wires were placed  and brought through the skin.   When the patient had been rewarmed to 37-degrees centigrade, he was weaned  from cardiopulmonary bypass on low-dose dopamine.  Total bypass time was 96  minutes.  Cardiac function appeared good with a cardiac output of 6 to 7  liters per minute.  Protamine was given, and the venous and aortic cannulas  were removed without difficulty.  Hemostasis was achieved.  Three chest  tubes were placed with 2 in the posterior pericardium, 1 in the left pleural  space and 1 in the anterior mediastinum.  The pericardium was loosely  reapproximated over the heart.  Sternum was closed #6 stainless steel wires.  Fascia was closed with continuous #1 Vicryl suture.  Subcutaneous tissue was closed with continuous 2-0 Vicryl and the skin with a 3-0 Vicryl  subcuticular closure.  Lower extremity vein harvest site was closed in  layers in a similar manner.  The sponge, needles and counts were correct  ____________.  Dry sterile dressings were applied over the incisions and  around the chest tubes which were hooked to Pleur-Evac suction.  The patient  remained hemodynamically stable and was transported to the SICU in guarded  but stable condition.      Gilford Raid, M.D.  Electronically Signed     BB/MEDQ  D:  10/26/2005  T:  10/27/2005  Job:  [id number]

## 2010-08-11 NOTE — Assessment & Plan Note (Signed)
[place] OFFICE NOTE   NAME:[name]                      MRN:          [id number]  DATE:10/31/2005                            DOB:          [birth year]   [name] is a patient of Dr. Kyla Balzarine and he will be seen by Dr. Cristopher Peru in the future for the assesment for ICD.   HISTORY OF PRESENT ILLNESS:  He is a 75 year old male with a history of  diabetes, hypertension, dyslipidemia, and a family history of coronary  artery disease who presented after one month of intermittent chest pain and  progressive dyspnea.  He had a Myoview study which was abnormal, ejection  fraction 28%.  He went for a catheterization at [facility] on October 24, 2005, this study showed three vessel coronary artery disease.  CVTS was  consulted, and Dr. Cyndia Bent performed coronary artery bypass grafting surgery  x4 on October 26, 2005.  He had postoperative atrial fibrillation which  converted to sinus rhythm on Amiodarone loading.  He was seen by Dr. Lia Foyer  who did the catheterization in the postoperative period, and Dr. Lia Foyer  requested an electrophysiology evaluation.  I would make note at this time  of Dr. 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  Our plan at this  patient's discharge on October 31, 2005, after discussion with Dr. Lovena Le is  as follows:   PLAN:  1.  Two week post-coronary bypass surgery followup at [facility]      with chest x-ray.  At that time, the patient will have been on      Amiodarone for two weeks.  Note, if the patient is to continue on      Amiodarone, he will need repeat liver function studies, TSH, and      pulmonary function tests.  2.  The patient will have a 2-D echocardiogram in eight weeks, and then he      will see Dr. Lovena Le on January 09, 2006.   OFFICE VISITS:  1.  The post-coronary  visit is Tuesday, November 13, 2005, at 10:15.  Chest x-      ray will be taken.  2.  Echocardiogram is scheduled for Tuesday, December 25, 2005, at 10:30.  3.  He will see Dr. Lovena Le on Wednesday, January 09, 2006, at 10:30 in the      morning and discuss defibrillator.                                 Sueanne Margarita, PA                            Collier Salina C. Johnsie Cancel, MD, [facility]   GM/MedQ  DD:  10/31/2005  DT:  10/31/2005  Job #:  [id number]

## 2010-08-11 NOTE — Assessment & Plan Note (Signed)
[place] OFFICE NOTE   NAME:[name], [name]                      MRN:          [id number]  DATE:01/10/2006                            DOB:          12/27/1935    [name] is in for a followup visit.  He is stable.  He has not been  having any ongoing chest pain or shortness of breath.  He seems to be coming  around the corner.  Followup echocardiogram demonstrates ejection fraction  of 40-45%.  Unfortunately, his hemoglobin has not come up although his MCV  is normal and hemoccults of the stools have been entirely negative.  His  hemoglobin prior to surgery was normal.  He has just now gotten 325 mg of  iron and he has been taking it twice a day, so our plan is to go ahead and  get a CBC today.  We have arranged for him to have a followup visit with Dr.  Julien Nordmann for anemia evaluation, and we will have him see Dr. Kenton Kingfisher as well  if, in fact, his hemoglobin does not improve.  His dizziness has improved  rather substantially.   His current weight is 233 pounds, blood pressure 118/68, the pulse is 64.  The lung fields are clear and the cardiac rhythm is regular.   Overall, I am quite pleased with his progress.  With an EF of 40-45% at the  present time he will likely not need a defibrillator.  We will probably  recheck this is 3-6 months to reassess where we stand with that.  With his  drop in weight, his diabetic medications can potentially be altered if he  continues to lose weight.  Finally, a followup CBC will be obtained.  We  plan to see him back in followup in about 6 weeks.       Loretha Brasil. Lia Foyer, MD, [facility]     TDS/MedQ  DD:  01/10/2006  DT:  01/12/2006  Job #:  [id number]   cc:   Marchia Bond, M.D.

## 2010-08-11 NOTE — Assessment & Plan Note (Signed)
[name] OFFICE NOTE   NAME:[name], [name]                      MRN:          [id number]  DATE:12/13/2005                            DOB:          Apr 23, 1935    [name] is in for a followup.  In general, he has had a little bit of  dizziness, but overall is feeling relatively improved.  He has been  continuing on both Lasix as well as potassium for some swelling in the lower  extremities.  The patient underwent a repeat echocardiogram, and this now  demonstrates an ejection fraction in the range of about 40% to 45%, with  akinesis of the periapical wall.  There was a mild increase in the aortic  valve thickness and mild aortic root dilatation as well as some mitral  annular calcification.  There was not felt to be significant aortic stenosis  or aortic regurgitation.  When he saw Dr. Cyndia Bent, he recommended that his  amiodarone be decreased, and the patient has not had recurrent suggestions  of symptoms.   CURRENT MEDICATIONS:  1. Metformin 1000 mg b.i.d.  2. Actos 45 mg daily.  3. Glipizide 10 mg daily.  4  Simvastatin 20 mg daily.  1. Fish oil daily.  2. Vitamin C daily.  3. Vitamin D 2 daily.  4. Amiodarone 200 mg b.i.d.  5. Toprol 25 mg daily.  6. Aspirin 325 mg daily.  7. Furosemide 40 mg daily.  8. Potassium chloride 20 mEq daily.  9. Benicar 20 mg daily.   PHYSICAL EXAMINATION:  The blood pressure is 102/68, and the pulse is 54.  The lung fields are relatively clear.  His incisions appear to be healing  well.   His recent lipid profile reveals an LDL of 64 with a cholesterol of 126 on  simvastatin 20 mg daily.   His EKG reveals normal sinus rhythm with poor R wave progression consistent  with an anterolateral infarct of indeterminate age.   IMPRESSION:  1. Status post coronary artery bypass graft surgery for severe multivessel      disease and left ventricular dysfunction.  2.  Improved overall function from the time of surgery.  3. Postoperative atrial fibrillation, treated with amiodarone.  4. Overall mild volume excess.   PLAN:  1. We will decrease his aspirin to 81 mg daily.  2. I will recommend discontinuation of amiodarone.  3. We will decrease his Lasix to 20 mg daily and K-Dur to 10 mEq daily.  4. We will have him return to clinic in 3 weeks.  5. We will get a CBC and basic metabolic profile.       Loretha Brasil. Lia Foyer, MD, [facility]     TDS/MedQ  DD:  12/21/2005  DT:  12/22/2005  Job #:  [id number]

## 2010-08-11 NOTE — Assessment & Plan Note (Signed)
[facility] OFFICE NOTE   NAME:Sestak, TRESHUN GIOVINO                      MRN:          [id number]  DATE:02/22/2006                            DOB:          17-Jan-1936    Mr. Arduini is in for followup.  He really is doing quite well.  He has a  rare tinge of pain in the left upper chest.  He did have some bigeminal  rhythm at rehab, and he had been drinking [employer] earlier in the day.  They advised him to hold on this.  In general he would like to start  doing some very light weights, and I thought that this would be  reasonable, but I told him to try to keep it to a minimum.  He denies  any ongoing active chest pain.  He has continued to take iron.  His  Hemoccults were entirely negative, and his last hemoglobin had come up  to 12.   CURRENT MEDICATIONS:  1. Metformin 1000 mg p.o. b.i.d.  2. Actos 45 mg daily.  3. Glipizide 10 mg daily.  4. Simvastatin 20 mg daily.  5. Multivitamin 1 daily.  6. Fish oil daily.  7. Vitamin C daily.  8. Vitamin D 2 a day.  9. Toprol 25 mg daily.  10.Aspirin 81 mg daily.  11.Benicar 20 mg daily.  12.Ferrous sulfate 325 b.i.d.  13.Albuterol p.r.n.   PHYSICAL EXAMINATION:  The blood pressure is 122/70, the pulse is 55.  LUNGS:  The lung fields are clear.  CARDIAC:  The cardiac rhythm is regular without a significant murmur.   IMPRESSION:  1. Coronary artery disease, status post coronary artery bypass graft      surgery.  2. Left ventricular dysfunction with improved function on last      echocardiography.  3. Modest anemia with negative Hemoccults, improved with iron      replacement.   PLAN:  1. I told him I thought he could stop his iron.  At this time we will      get a followup CBC, hopefully has normalized back to his baseline      prior to surgery.  2. He should continue his current medical regimen.  3. He can do very, very light weights.  4. I will see him back  in followup in 3 months, at which time a repeat      echocardiogram will be done.     Loretha Brasil. Lia Foyer, MD, [facility]  Electronically Signed    TDS/MedQ  DD: 02/22/2006  DT: 02/24/2006  Job #: [id number]

## 2010-08-11 NOTE — H&P (Signed)
[name], [name]               ACCOUNT NO.:  [id number]   MEDICAL RECORD NO.:  [id number]          PATIENT TYPE:  OIB   LOCATION:  NA                           FACILITY:  [facility]   PHYSICIAN:  Loretha Brasil. Lia Foyer, M.D. [facility] OF BIRTH:  [birth year]   DATE OF ADMISSION:  DATE OF DISCHARGE:                                HISTORY & PHYSICAL   CHIEF COMPLAINT:  Shortness of breath.   HISTORY OF PRESENT ILLNESS:  [name] is a 75 year old gentleman who was  referred by Dr. Kenton Kingfisher for evaluation of chest pain and shortness of breath.  The patient has a long history of diabetes and a family history of coronary  disease and hypertension.  A few weeks ago the patient had an episode of  shortness of breath.  This was associated with some mild chest pain.  He  apparently was seen and chest x-rays repeated and suggested effusion, and he  was given diuretics.  The patient works out at [employer] on __________  [address] and has been very active.  He has had a little bit of chest  discomfort with activity and subsequently was referred to Dr. Johnsie Cancel.  With  the referral to Dr. Johnsie Cancel he underwent echocardiography, which suggested  left ventricular dysfunction and akinesis of the basal, mid inferior,  posterior wall with apical akinesis.  Cardiolite imaging suggests an  anteroapical persistent defect and an inferobasal persistent defect, and the  ejection fraction was calculated at 28%.  He actually is feeling somewhat  better.  He was brought around from the nuclear lab today for evaluation,  and I discussed case with Dr. Johnsie Cancel.  It was felt that bringing the patient  in fairly promptly for cardiac catheterization would be indicated.  Importantly the patient also had an exercise blood pressure drop.   PAST MEDICAL HISTORY:  Remarkable for diabetes of over 20 years.  The  patient had a tonsillectomy in 1955.   There are no known drug allergies.   MEDICATIONS:  1.  Metformin 1000 mg p.o. b.i.d.  2.  Actos 45 mg daily.  3.  Glipizide 10 mg daily.  4.  Simvastatin 20 mg daily.  5.  Benicar 40 mg daily.  6.  Aspirin 81 mg daily.  7.  Multivitamin one daily.  8.  Fish oil daily.  9.  Vitamin C 500 mg daily.  10. Vitamin D two per day.   FAMILY HISTORY:  The patient's mother died at 61.  She had peritonitis from  diverticular disease.  His father died at 53 from apparently a myocardial  infarction.  He has no brothers and sisters.   SOCIAL HISTORY:  The patient quit smoking in 1972.  He works in Landscape architect.  He is married.     Other review of systems is remarkable for some recent shortness of breath  that has been improved with treatment.  He has also had some mild chest  discomfort.  HEENT is largely negative.  The remainder of the review of his  exams is negative.   PHYSICAL EXAMINATION:  GENERAL:  On examination he  is an alert, oriented  gentleman in no acute distress.  VITAL SIGNS:  Blood pressure is 121/60, pulse 83.  CHEST:  Lung fields are clear to auscultation percussion.  NECK: Jugular veins are nondistended.  CARDIAC:  PMI is nondisplaced.  There is an S4 gallop.  EXTREMITIES:  Femoral pulses are intact.  Distal extremities are intact.  NEUROLOGIC:  Nonfocal.   EKG reveals normal sinus rhythm.  There is loss of R-waves through V4.  There is also a small Q in II and aVF.  Findings suggest inferoposterior and  anterior infarct of indeterminate age.   Echocardiography reveals mild to moderately decreased overall left  ventricular function with hypokinesis to akinesis of the basal mid inferior  posterior wall.  There is apical akinesis.  Left atrium is mildly dilated.  There is moderate mitral annular calcification.   Cardiolite imaging reveals suggestion of persistent defects in the  anteroapical and inferobasal segments with ejection fraction 28%.   IMPRESSION:  1.  Likely multivessel coronary disease based on noninvasive evaluation.  2.  Non-insulin  dependent diabetes mellitus of long duration.  3.  Hypercholesterolemia on lipid lowering therapy.  4.  Prior tonsillectomy.   PLAN:  Based upon the findings, which include significant left ventricular  dysfunction, symptoms suggestive of coronary disease, and exercise drop in  blood pressure associated with diabetes mellitus, cardiac catheterization is  indicated.  Risks, benefits, alternatives have been explained to the patient  in detail and he is agreeable to proceed.  This will occur in the outpatient  laboratory at [place]. Lia Foyer, M.D. [facility]  Electronically Signed     TDS/MEDQ  D:  10/24/2005  T:  10/24/2005  Job:  [id number]   cc:   Azalia Bilis, M.D.  Jenkins Rouge, M.D.

## 2010-08-11 NOTE — Assessment & Plan Note (Signed)
 OFFICE NOTE   NAME:[name], [name]                      MRN:          [id number]  DATE:10/23/2005                            DOB:          1935-09-23    [name] is a 75 year old gentleman who recently has had some shortness of  breath and chest discomfort.  He was seen in consultation by Dr. Johnsie Cancel;  this was on October 19, 2005.  He underwent Cardiolite imaging and this  revealed a large anteroapical as well as inferior basal defect with an  ejection fraction of 28%, which appears to be largely persistent.  The  findings suggest a scar.  He also underwent echocardiography.  There was  mildly-to-moderately decreased overall left ventricular function with severe  hypokinesis of the mid basal, mid inferoposterior wall, and apical akinesis.  There was moderate annular calcification.  The patient has been stable but  has been moderately short of breath.  The findings suggest significant left  ventricular dysfunction, and with perfusion defects strongly suggest  coronary artery disease.  The patient was exhausted at stage II of the Bruce  protocol with a heart rate of 116, and he did have some chronotropic  incompetence.  There was also a blood pressure drop with exercise.  Based on  this, I discussed the case with Dr. Johnsie Cancel, and it was felt that it would be  most appropriate to proceed promptly with cardiac catheterization for  identification of the patient's coronary anatomy.   PHYSICAL EXAMINATION:  VITAL SIGNS:  The patient's vital signs were stable.  At the completion of exercise, his blood pressure was 121/60 and the pulse  was 83.  CARDIOPULMONARY:  His lung fields are clear and there is an S4 gallop.  EXTREMITIES:  Extremities are intact.   The risks, benefits, and alternatives have been discussed with the patient  in detail, and he is agreeable to proceed, and we will proceed with cardiac  catheterization in the next day or two.                              Loretha Brasil. Lia Foyer, MD, [facility]    TDS/MedQ  DD:  10/24/2005  DT:  10/24/2005  Job #:  [id number]   cc:   Wallis Bamberg. Johnsie Cancel, MD, [facility]

## 2010-08-11 NOTE — Assessment & Plan Note (Signed)
[facility] OFFICE NOTE   NAME:Bebeau, JAVONNE BORKE                      MRN:          [id number]  DATE:05/29/2006                            DOB:          1935-07-18    Mr. Giovanelli is in for a follow up visit. He underwent a colonoscopy  apparently without findings other than perhaps two polyps. There was  difficulty with him out. I see him on the treadmill at [employer] quite  frequently and he appears to be doing well from that standpoint.  Fortunately, follow up echocardiography has demonstrated marked  improvement in overall estimated ejection fraction. This was estimated  at 60% with an aortic root upper normal in size. He feels well. His  hemoglobin, apparently, is up as well.   CURRENT MEDICATIONS:  1. Actos 45 mg daily.  2. Simvastatin 20 mg daily.  3. Multivitamin daily.  4. Fish oil daily.  5. Vitamin C daily.  6. Vitamin D daily.  7. Toprol 25 mg daily.  8. Aspirin 81 mg daily.  9. Benicar 20 mg daily.  10.Albuterol p.r.n.   PHYSICAL EXAMINATION:  GENERAL: He is alert and oriented.  VITAL SIGNS: Weight 227 pounds, blood pressure 112/64, pulse 59.  LUNGS: There are not currently expiratory wheezes.  CARDIAC: Rhythm is regular without significant murmur or rub.  EXTREMITIES: No definite edema.   The electrocardiogram demonstrates normal sinus rhythm. There is a delay  in R-wave progression with reduced voltage overall, but no apparent  change from previous tracings.   IMPRESSION:  1. Coronary artery disease status post coronary revascularization      surgery.  2. Marked improvement in overall left ventricular ejection fraction.  3. Anemia of uncertain etiology currently being followed by Dr.      Azalia Bilis with recent endoscopic evaluation.   PLAN:  1. Continue current medical regimen.  2. Continued follow up with Dr. Kenton Kingfisher.  3. We can see the patient in follow up in 6 months to 1 years  time.     Loretha Brasil. Lia Foyer, MD, [facility]  Electronically Signed    TDS/MedQ  DD: 06/03/2006  DT: 06/03/2006  Job #: [id number]   cc:   Azalia Bilis, M.D.

## 2010-08-11 NOTE — Discharge Summary (Signed)
[name], [name]               ACCOUNT NO.:  [id number]   MEDICAL RECORD NO.:  [id number]          PATIENT TYPE:  INP   LOCATION:  2022                         FACILITY:  [facility]   PHYSICIAN:  Gilford Raid, M.D.     DATE OF BIRTH:  02/12/1936   DATE OF ADMISSION:  10/24/2005  DATE OF DISCHARGE:  10/31/2005                                 DISCHARGE SUMMARY   PRIMARY ADMITTING DIAGNOSIS:  Chest pain.   ADDITIONAL/DISCHARGE DIAGNOSES:  1. Severe three-vessel coronary artery disease.  2. Type 2 diabetes mellitus.  3. Hypertension.  4. Hyperlipidemia.  5. Previous history of tobacco abuse.  6. Postoperative atrial fibrillation.  7. Ejection fraction of 28%.  8. Forty to sixty percent right internal carotid artery stenosis,      asymptomatic.   HISTORY:  The patient is a 75 year old male who was recently referred to  [facility] cardiology by Dr. Azalia Bilis for evaluation of chest pain and  shortness of breath.  At the time he was seen initially by Dr. Kenton Kingfisher.  A  chest x-ray was performed which showed effusion and he was treated with  diuretics.  He has continued to have chest discomfort with activity and was  subsequently referred to Dr. Johnsie Cancel.  He underwent echocardiogram was  suggested left ventricular dysfunction and akinesis of the basal, mid  inferior, posterior wall with apical akinesis.  He underwent a Cardiolite  study which showed an anteroapical persistent defect and inferobasilar  persistent defect and ejection fraction was calculated at 28%.  It was felt  that he should undergo further workup and he was brought in on the date of  this admission for outpatient cardiac catheterization by Dr. Lia Foyer.  This  showed severe three-vessel coronary artery disease which was not felt to be  amenable to percutaneous intervention.  A left ventriculogram was not done  due to his history of diabetes and mildly elevated baseline creatinine of  1.4.  Because of his findings and his  symptoms, he was admitted and a  cardiothoracic surgery consultation was obtained.   HOSPITAL COURSE:  Mr. Sthilaire was admitted following his catheterization and  was seen in consultation by Dr. Gilford Raid.  After review of his films and  examination the patient, he felt that his best course of action would be to  proceed with surgical revascularization at this time.  He explained the  risks, benefits and alternatives of the procedure to the patient and he  agreed to proceed.  He underwent a complete preoperative workup included  carotid Doppler studies which showed a right 40-60% ICA stenosis with no  stenosis on the left and normal ABIs bilaterally.  He was taken to the  operating room on 10/26/2005 and underwent CABG x4 as described in detail  above.  He tolerated the procedure well and was transferred to the SICU in  stable condition.  He initially required low-dose dopamine and Neo-  Synephrine drips for blood pressure maintenance.  He also was maintained on  the Glucommander and Lantus insulin over the course of his first  postoperative day.  He was  hemodynamically stable and extubated and doing  well on postop day #1.  His chest tubes and hemodynamic monitoring devices  were removed and he was kept in the unit for further observation.  By postop  day #2, his blood sugars have been well-controlled and he is tolerating  p.o.'s well, So he was started back on his p.o. diabetes medications.  Also,  his blood pressure was stable and he was off all drips and was started on a  low-dose beta blocker.  Late in the day postop day #2 he was able to be  transferred to the floor.  Prior to his transfer, however, he developed  atrial fibrillation and was started on amiodarone drip.  He was able to  convert to normal sinus rhythm.  He was kept in the unit for further  monitoring.  By postop day #3, he had been in sinus rhythm for almost 24  hours and was switched to a p.o. dose of amiodarone.  He  was at that time  transferred to the floor.  He continues to maintain normal sinus rhythm and  his vital signs have otherwise been stable.  His blood pressure is still a  little on the low side in the 0000000 systolic and because of this he has  not been started on an ACE inhibitor.  His blood sugars have been under good  control on his oral agents.  His incisions are all healing well.  He is  ambulating with cardiac rehab phase one and is making good progress.  His  most recent labs showed a hemoglobin of 10.9, hematocrit 32.3, white count  11.5, platelets 111.  Sodium 132, potassium 4.2, BUN 21, creatinine 1.2.  Because of his low ejection fraction, an electrophysiology consult has been  requested.  In terms of postsurgical standpoint, he is doing well and is  otherwise ready for discharge home hopefully on 10/31/2005.  We will await  the EP recommendations as the patient may possibly need an AICD in the  future.   DISCHARGE MEDICATIONS:  1. At this time are enteric-coated aspirin 325 mg daily.  2. Toprol XL 25 mg daily.  3. Zocor 20 mg at bedtime  4. Amiodarone 400 mg b.i.d. x 1 week then 200 mg b.i.d.  5. Lasix 40 mg daily x3 days.  6. K-Dur 20 mEq daily x3 days.  7. Actos 45 mg daily.  8. Glucotrol ER 10 mg daily.  9. Metformin 1000 mg b.i.d.  10.Albuterol inhaler p.r.n.  11.Fish oil, multivitamin and vitamin C as directed.  12.Tylox one to two q.4-6 h p.r.n. for pain.   DISCHARGE INSTRUCTIONS:  He is asked to refrain from driving, heavy lifting  or strenuous activity.  He may continue ambulating daily and using his  incentive spirometer.  He may shower daily and clean his incisions with soap  and water.  He will continue a low-fat, low-sodium, carbohydrate-modified  diet.   DISCHARGE FOLLOWUP:  He will see Dr. Johnsie Cancel back in the office in 2 weeks  and have a chest x-ray at that visit.  He will follow up with Dr. Cyndia Bent on August 28 at 12:15 p.m. and is asked to bring his  chest x-ray to this visit.  Our office will be contacted in the interim if he experiences any problems  or has questions.      Suzzanne Cloud, P.A.      Gilford Raid, M.D.  Electronically Signed    GC/MEDQ  D:  10/30/2005  T:  10/30/2005  Job:  [id number]   cc:   Wallis Bamberg. Johnsie Cancel, MD,FACC  Azalia Bilis, M.D.

## 2010-08-11 NOTE — Discharge Summary (Signed)
[name], [name]               ACCOUNT NO.:  [id number]   MEDICAL RECORD NO.:  [id number]          PATIENT TYPE:  INP   LOCATION:  2022                         FACILITY:  [facility]   PHYSICIAN:  Gilford Raid, M.D.     DATE OF BIRTH:  1935-04-26   DATE OF ADMISSION:  10/24/2005  DATE OF DISCHARGE:                                 DISCHARGE SUMMARY   ADDENDUM:  Mr. Brain is stable from a post surgical standpoint is ready for discharge  on October 31, 2005.  I have discussed the patient with Marcellus Scott, P.A.-C.  from the electrophysiology cardiology service.  Their plan is to see the  patient back as an outpatient for consideration of an ICD.  They will  schedule him for a 2-D echocardiogram in eight weeks and he will  subsequently follow up with Dr. Lovena Le to reassess his candidacy for an  AICD.  Also, Mikki Santee has scheduled the patient for LFTs and a TSH as well as  PFTs while he is on amiodarone.  His other discharge instructions and  medications are unchanged from the previously dictated discharge summary.  He is stable this morning and has had no other changes in his postoperative  course.      Suzzanne Cloud, P.A.      Gilford Raid, M.D.  Electronically Signed    GC/MEDQ  D:  10/31/2005  T:  10/31/2005  Job:  [id number]   cc:   Azalia Bilis, M.D.  Wallis Bamberg. Johnsie Cancel, MD,FACC  Champ Mungo. Lovena Le, MD

## 2010-08-22 ENCOUNTER — Other Ambulatory Visit (INDEPENDENT_AMBULATORY_CARE_PROVIDER_SITE_OTHER): Payer: Medicare Other

## 2010-08-22 ENCOUNTER — Ambulatory Visit (INDEPENDENT_AMBULATORY_CARE_PROVIDER_SITE_OTHER): Payer: Medicare Other | Admitting: Vascular Surgery

## 2010-08-22 DIAGNOSIS — I6529 Occlusion and stenosis of unspecified carotid artery: Secondary | ICD-10-CM

## 2010-08-23 ENCOUNTER — Other Ambulatory Visit (HOSPITAL_COMMUNITY): Payer: Self-pay

## 2010-08-23 NOTE — Assessment & Plan Note (Signed)
OFFICE VISIT  CHEVIS, LOUNDS DOB:  06/06/1935                                       08/22/2010 [id number]  The patient returns today for continued followup regarding his carotid occlusive disease.  He was referred to me 1 year ago by Dr. __________ and Dr. Azalia Bilis for a 70% right internal carotid stenosis which was asymptomatic.  He has had no carotid symptoms since that time including hemiparesis, aphasia, amaurosis fugax, diplopia, blurred vision or syncope.  He does have some cataracts which are due to be fixed in the near future.  He has no history of stroke.  CHRONIC MEDICAL PROBLEMS: 1. Non-insulin dependent diabetes mellitus. 2. Hypertension. 3. Coronary artery disease.  Previous coronary artery bypass grafting     in 2007 by Dr. Cyndia Bent with cardiac cath in February 2010. 4. Negative COPD or hyperlipidemia.  FAMILY HISTORY:  Positive for coronary artery disease.  Father died of an MI at age 6.  Negative for diabetes or stroke.  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  Does not use tobacco or alcohol.  REVIEW OF SYSTEMS:  Negative for chest pain, dyspnea on exertion, PND or orthopnea.  All other systems are negative in complete review of systems.  PHYSICAL EXAMINATION:  Vital Signs:  Blood pressure 128/60, heart rate 77, respirations 18.  General:  He is a well-developed, well-nourished, healthy-appearing male in no apparent distress, alert and oriented x3. HEENT:  Exam is normal for age.  EOMs intact.  Lungs:  Clear to auscultation.  No rhonchi or wheezing.  Cardiovascular Exam:  Regular rhythm.  No murmurs.  Carotid pulses 3+ with soft bruit on the right, no bruit on the left.  Abdomen:  Soft, nontender with no masses. Musculoskeletal Exam:  Free of major deformities.  Neurologic:  Exam is normal.  Extremities:  Lower extremity exam reveals 3+ femoral and dorsalis pedis pulses  bilaterally.  Today I ordered a carotid duplex exam which I reviewed and interpreted. I compared this to the previous study done in May 2011 at [facility].  There appears to be no significant change in the right internal carotid stenosis which approximates 70%.  The left internal carotid has no flow reduction.  I reassured him regarding this.  We will continue to follow him on an annual basis in our office for his asymptomatic carotid disease.  If he develops any symptoms, he will be in touch with [place] and continue to take aspirin 81 mg per day.    Nelda Severe Kellie Simmering, M.D. Electronically Signed  JDL/MEDQ  D:  08/22/2010  T:  08/23/2010  Job:  [id number]

## 2010-08-24 ENCOUNTER — Encounter (HOSPITAL_COMMUNITY): Payer: Medicare Other

## 2010-08-24 ENCOUNTER — Other Ambulatory Visit: Payer: Self-pay | Admitting: Anesthesiology

## 2010-08-24 LAB — BASIC METABOLIC PANEL
BUN: 23 mg/dL (ref 6–23)
CO2: 26 mEq/L (ref 19–32)
Calcium: 9.6 mg/dL (ref 8.4–10.5)
Chloride: 103 mEq/L (ref 96–112)
Creatinine, Ser: 1.12 mg/dL (ref 0.4–1.5)
GFR calc Af Amer: >60 mL/min (ref >60)
GFR calc non Af Amer: >60 mL/min (ref >60)
Glucose, Bld: 119 mg/dL — ABNORMAL HIGH (ref 70–99)
Potassium: 4.7 mEq/L (ref 3.5–5.1)
Sodium: 137 mEq/L (ref 135–145)

## 2010-08-24 LAB — HEMOGLOBIN AND HEMATOCRIT, BLOOD
HCT: 39.1 % (ref 39.0–52.0)
Hemoglobin: 13.1 g/dL (ref 13.0–17.0)

## 2010-08-28 ENCOUNTER — Ambulatory Visit (HOSPITAL_COMMUNITY)
Admission: RE | Admit: 2010-08-28 | Discharge: 2010-08-28 | Disposition: A | Discharge status: Home / Self Care | Payer: Medicare Other | Source: Ambulatory Visit | Attending: Ophthalmology | Admitting: Ophthalmology

## 2010-08-28 DIAGNOSIS — Z7982 Long term (current) use of aspirin: Secondary | ICD-10-CM | POA: Insufficient documentation

## 2010-08-28 DIAGNOSIS — Z951 Presence of aortocoronary bypass graft: Secondary | ICD-10-CM | POA: Insufficient documentation

## 2010-08-28 DIAGNOSIS — E119 Type 2 diabetes mellitus without complications: Secondary | ICD-10-CM | POA: Insufficient documentation

## 2010-08-28 DIAGNOSIS — H2510 Age-related nuclear cataract, unspecified eye: Secondary | ICD-10-CM | POA: Insufficient documentation

## 2010-08-28 LAB — GLUCOSE, CAPILLARY: Glucose-Capillary: 128 mg/dL — ABNORMAL HIGH (ref 70–99)

## 2010-08-28 NOTE — Procedures (Unsigned)
CAROTID DUPLEX EXAM  INDICATION:  Carotid disease.  HISTORY: Diabetes:  Yes Cardiac:  MI Hypertension:  Yes Smoking:  Previous Previous Surgery:  No CV History:  Currently asymptomatic Amaurosis Fugax No, Paresthesias No, Hemiparesis No                                      RIGHT             LEFT Brachial systolic pressure:         136               132 Brachial Doppler waveforms:         Normal            Normal Vertebral direction of flow:        Antegrade         Antegrade DUPLEX VELOCITIES (cm/sec) CCA peak systolic                   116               84 ECA peak systolic                   155               [id number] ICA peak systolic                   254               90 ICA end diastolic                   72                26 PLAQUE MORPHOLOGY:                  Heterogeneous     Heterogeneous PLAQUE AMOUNT:                      Moderate          Mild PLAQUE LOCATION:                    Proximal ICA/bifurcation            ICA/ECA  IMPRESSION: 1. Doppler velocities suggest 60% to 79% stenosis of the right     proximal internal carotid artery. 2. No hemodynamically significant stenosis of the left internal     carotid artery noted with plaque formation as described above.  ___________________________________________ Nelda Severe. Kellie Simmering, M.D.  CH/MEDQ  D:  08/23/2010  T:  08/23/2010  Job:  [id number]

## 2010-09-08 ENCOUNTER — Ambulatory Visit: Payer: Self-pay | Admitting: Vascular Surgery

## 2010-12-08 ENCOUNTER — Encounter: Payer: Self-pay | Admitting: Cardiology

## 2010-12-08 ENCOUNTER — Encounter: Payer: Self-pay | Admitting: *Deleted

## 2010-12-08 DIAGNOSIS — Z862 Personal history of diseases of the blood and blood-forming organs and certain disorders involving the immune mechanism: Secondary | ICD-10-CM

## 2010-12-11 ENCOUNTER — Ambulatory Visit (INDEPENDENT_AMBULATORY_CARE_PROVIDER_SITE_OTHER): Payer: Medicare Other | Admitting: Cardiology

## 2010-12-11 ENCOUNTER — Encounter: Payer: Self-pay | Admitting: Cardiology

## 2010-12-11 VITALS — BP 112/62 | HR 54 | Ht 72.0 in | Wt 230.0 lb

## 2010-12-11 DIAGNOSIS — I25810 Atherosclerosis of coronary artery bypass graft(s) without angina pectoris: Secondary | ICD-10-CM

## 2010-12-11 NOTE — Assessment & Plan Note (Signed)
Stable at the present time.  No current symptoms

## 2010-12-11 NOTE — Progress Notes (Signed)
HPI:  Doing extremely well. No particular symptoms.  Getting about without chest pain.   Current Outpatient Prescriptions  Medication Sig Dispense Refill  . ACTOS 45 MG tablet 1 tab daily      . aspirin 81 MG tablet Take 81 mg by mouth daily.        [name] BENICAR 20 MG tablet 1 tab daily      . cholecalciferol (VITAMIN D) 400 UNITS TABS Take by mouth. VITAMIN D #3       . fish oil-omega-3 fatty acids 1000 MG capsule Take 1 g by mouth daily.        [name] glipiZIDE-metformin (METAGLIP) 5-500 MG per tablet 2 tabs twice a day      . metoprolol succinate (TOPROL-XL) 25 MG 24 hr tablet 1 tab daily      . Multiple Vitamin (MULTI-DAY VITAMINS PO) Take by mouth. 1 tab daily       . omeprazole (PRILOSEC) 20 MG capsule 1 tab twice a day      . simvastatin (ZOCOR) 20 MG tablet 1 tab daily      . vitamin C (ASCORBIC ACID) 500 MG tablet Take 500 mg by mouth daily.          No Known Allergies  Past Medical History  Diagnosis Date  . DIABETES MELLITUS, TYPE II   . HYPERCHOLESTEROLEMIA   . UNSPECIFIED THROMBOCYTOPENIA   . HYPERTENSION   . CAD, ARTERY BYPASS GRAFT   . PULMONARY HYPERTENSION   . CAROTID ARTERY STENOSIS   . RENAL INSUFFICIENCY, CHRONIC   . ANEMIA, HX OF     Past Surgical History  Procedure Date  . Coronary artery bypass graft   . Tonsillectomy     Family History  Problem Relation Age of Onset  . Diverticulitis    . Heart attack      History   Social History  . Marital Status: Married    Spouse Name: [name]    Number of Children: [name]  . Years of Education: [name]   Occupational History  . Not on file.   Social History Main Topics  . Smoking status: Former Smoker    Quit date: 03/26/1970  . Smokeless tobacco: Not on file  . Alcohol Use: Not on file  . Drug Use: Not on file  . Sexually Active: Not on file   Other Topics Concern  . Not on file   Social History Narrative  . No narrative on file    ROS: Please see the HPI.  All other systems reviewed and  negative.  PHYSICAL EXAM:  BP 112/62  Pulse 54  Ht 6' (1.829 m)  Wt 230 lb (104.327 kg)  BMI 31.19 kg/m2  General: Well developed, well nourished, in no acute distress. Head:  Normocephalic and atraumatic. Neck: no JVD Lungs: Clear to auscultation and percussion. Heart: Normal S1 and S2.  No murmur, rubs or gallops.  Abdomen:  Normal bowel sounds; soft; non tender; no organomegaly Pulses: Pulses normal in all 4 extremities. Extremities: No clubbing or cyanosis. No edema. Neurologic: Alert and oriented x 3.  EKG:  SB.  Low voltage QRS.  Anterolateral MI, age indeterminate.    ASSESSMENT AND PLAN:

## 2010-12-11 NOTE — Assessment & Plan Note (Signed)
Followed by his primary doctor.

## 2010-12-11 NOTE — Patient Instructions (Signed)
Your physician wants you to follow-up in: 1 YEAR.  You will receive a reminder letter in the mail two months in advance. If you don't receive a letter, please call our office to schedule the follow-up appointment.  Your physician recommends that you continue on your current medications as directed. Please refer to the Current Medication list given to you today.  

## 2010-12-11 NOTE — Assessment & Plan Note (Signed)
Well-controlled at the present time. ?

## 2011-04-09 ENCOUNTER — Telehealth: Payer: Self-pay | Admitting: Cardiology

## 2011-04-09 NOTE — Telephone Encounter (Signed)
LOV x4,Cath,Echo,12 lead faxed to [place] [facility] @ [phone] 04/09/11/KM

## 2011-08-22 ENCOUNTER — Other Ambulatory Visit: Payer: Medicare Other

## 2011-08-23 ENCOUNTER — Other Ambulatory Visit: Payer: Medicare Other

## 2011-09-05 ENCOUNTER — Encounter: Payer: Self-pay | Admitting: Neurosurgery

## 2011-09-06 ENCOUNTER — Ambulatory Visit (INDEPENDENT_AMBULATORY_CARE_PROVIDER_SITE_OTHER): Payer: Medicare Other | Admitting: *Deleted

## 2011-09-06 ENCOUNTER — Ambulatory Visit (INDEPENDENT_AMBULATORY_CARE_PROVIDER_SITE_OTHER): Payer: Medicare Other | Admitting: Neurosurgery

## 2011-09-06 ENCOUNTER — Encounter: Payer: Self-pay | Admitting: Neurosurgery

## 2011-09-06 VITALS — BP 140/64 | HR 58 | Resp 16 | Ht 71.0 in | Wt 224.9 lb

## 2011-09-06 DIAGNOSIS — I6529 Occlusion and stenosis of unspecified carotid artery: Secondary | ICD-10-CM

## 2011-09-06 NOTE — Progress Notes (Signed)
[facility] [place] HISTORY AND PHYSICAL   CC: Annual carotid duplex for known stenosis Referring Physician: Kellie Simmering  History of Present Illness: 76 year old well patient of Dr. Kellie Simmering followed for known carotid stenosis right greater than left. The patient denies any signs or symptoms of CVA, TIA, amaurosis fugax or any neural deficit. The patient also denies any new medical diagnoses or recent surgeries.  Past Medical History  Diagnosis Date  . DIABETES MELLITUS, TYPE II   . HYPERCHOLESTEROLEMIA   . UNSPECIFIED THROMBOCYTOPENIA   . HYPERTENSION   . CAD, ARTERY BYPASS GRAFT   . PULMONARY HYPERTENSION   . CAROTID ARTERY STENOSIS   . RENAL INSUFFICIENCY, CHRONIC   . ANEMIA, HX OF   . Myocardial infarction June 2007    ROS: [x]  Positive   [ ]  Denies    General: [ ]  Weight loss, [ ]  Fever, [ ]  chills Neurologic: [ ]  Dizziness, [ ]  Blackouts, [ ]  Seizure [ ]  Stroke, [ ]  "Mini stroke", [ ]  Slurred speech, [ ]  Temporary blindness; [ ]  weakness in arms or legs, [ ]  Hoarseness Cardiac: [ ]  Chest pain/pressure, [ ]  Shortness of breath at rest [ ]  Shortness of breath with exertion, [ ]  Atrial fibrillation or irregular heartbeat Vascular: [ ]  Pain in legs with walking, [ ]  Pain in legs at rest, [ ]  Pain in legs at night,  [ ]  Non-healing ulcer, [ ]  Blood clot in vein/DVT,   Pulmonary: [ ]  Home oxygen, [ ]  Productive cough, [ ]  Coughing up blood, [ ]  Asthma,  [ ]  Wheezing Musculoskeletal:  [ ]  Arthritis, [ ]  Low back pain, [ ]  Joint pain Hematologic: [ ]  Easy Bruising, [ ]  Anemia; [ ]  Hepatitis Gastrointestinal: [ ]  Blood in stool, [ ]  Gastroesophageal Reflux/heartburn, [ ]  Trouble swallowing Urinary: [ ]  chronic Kidney disease, [ ]  on HD - [ ]  MWF or [ ]  TTHS, [ ]  Burning with urination, [ ]  Difficulty urinating Skin: [ ]  Rashes, [ ]  Wounds Psychological: [ ]  Anxiety, [ ]  Depression   Social History History  Substance Use Topics  . Smoking status: Former Smoker    Quit date: 03/26/1970  . Smokeless tobacco: Not on file  . Alcohol Use: 0.6 oz/week    1 Glasses of wine per week    Family History Family History  Problem Relation Age of Onset  . Diverticulitis    . Heart attack    . Heart disease Father 7    Heart disease before age 50  . Heart attack Father   . Hypertension Father     No Known Allergies  Current Outpatient Prescriptions  Medication Sig Dispense Refill  . ACTOS 45 MG tablet 1 tab daily      . aspirin 81 MG tablet Take 81 mg by mouth daily.        [name] BENICAR 20 MG tablet 1 tab daily      . cholecalciferol (VITAMIN D) 400 UNITS TABS Take by mouth. VITAMIN D #3       . fish oil-omega-3 fatty acids 1000 MG capsule Take 1 g by mouth daily.        [name] glipiZIDE-metformin (METAGLIP) 5-500 MG per tablet 2 tabs twice a day      . metoprolol succinate (TOPROL-XL) 25 MG 24 hr tablet 1 tab daily      . Multiple Vitamin (MULTI-DAY VITAMINS PO) Take by mouth. 1 tab daily       . omeprazole (PRILOSEC)  20 MG capsule 1 tab twice a day      . simvastatin (ZOCOR) 20 MG tablet 1 tab daily      . vitamin C (ASCORBIC ACID) 500 MG tablet Take 500 mg by mouth daily.          Physical Examination  Filed Vitals:   09/06/11 1121  BP: 140/64  Pulse: 58  Resp:     Body mass index is 31.37 kg/(m^2).  General:  WDWN in NAD Gait: Normal HEENT: WNL Eyes: Pupils equal Pulmonary: normal non-labored breathing , without Rales, rhonchi,  wheezing Cardiac: RRR, without  Murmurs, rubs or gallops; Abdomen: soft, NT, no masses Skin: no rashes, ulcers noted  Vascular Exam Pulses:  3+ radial pulses bilaterally Carotid bruits: Mild carotid bruit heard on the right, carotid pulse on the left  Extremities without ischemic changes, no Gangrene , no cellulitis; no open wounds;  Musculoskeletal: no muscle wasting or atrophy   Neurologic: A&O X 3; Appropriate Affect ; SENSATION: normal; MOTOR FUNCTION:  moving all extremities equally. Speech is  fluent/normal  Non-Invasive Vascular Imaging CAROTID DUPLEX 09/06/2011  Right ICA 60 - 79 % stenosis Left ICA 20 - 39 % stenosis   ASSESSMENT/PLAN: asymptomatic high grade right carotid stenosis, 60-79% unchanged from previous exam, the patient will return in 6 months for repeat carotid duplex. He understands signs and symptoms of CVA and knows to report to the nearest emergency department should that occur. His questions were encouraged and answered.  Beatris Ship ANP Clinic MD: Oneida Alar

## 2011-09-06 NOTE — Addendum Note (Signed)
Addended by: Peter Minium K on: 09/06/2011 12:12 PM   Modules accepted: Orders

## 2011-09-10 NOTE — Procedures (Unsigned)
CAROTID DUPLEX EXAM  INDICATION:  Carotid disease.  HISTORY: Diabetes:  Yes. Cardiac:  MI. Hypertension:  Yes Smoking:  Previous. Previous Surgery:  No. CV History:  Asymptomatic. Amaurosis Fugax No, Paresthesias No, Hemiparesis jNo                                      RIGHT             LEFT Brachial systolic pressure: Brachial Doppler waveforms: Vertebral direction of flow:        Antegrade         Antegrade DUPLEX VELOCITIES (cm/sec) CCA peak systolic                   111               91 ECA peak systolic                   139               [id number] ICA peak systolic                   312               85 ICA end diastolic                   88                29 PLAQUE MORPHOLOGY:                  Heterogenous      Heterogenous PLAQUE AMOUNT:                      Moderate          Minimal PLAQUE LOCATION:                    ICA/bifurcation   ICA/ECA  IMPRESSION: 1. Right internal carotid artery velocity suggests 60-79% stenosis     (high end of range). 2. Right internal carotid artery velocities have increased in     comparison to the last exam. 3. Left internal carotid artery velocity suggests 1-39% stenosis. 4. Antegrade vertebral arteries bilaterally.  ___________________________________________ Nelda Severe Kellie Simmering, M.D.  EM/MEDQ  D:  09/06/2011  T:  09/06/2011  Job:  [id number]

## 2011-11-29 ENCOUNTER — Ambulatory Visit (INDEPENDENT_AMBULATORY_CARE_PROVIDER_SITE_OTHER): Payer: Medicare Other | Admitting: Cardiology

## 2011-11-29 ENCOUNTER — Encounter: Payer: Self-pay | Admitting: Cardiology

## 2011-11-29 VITALS — BP 114/62 | HR 52 | Ht 72.0 in | Wt 226.0 lb

## 2011-11-29 DIAGNOSIS — I2789 Other specified pulmonary heart diseases: Secondary | ICD-10-CM

## 2011-11-29 DIAGNOSIS — I2510 Atherosclerotic heart disease of native coronary artery without angina pectoris: Secondary | ICD-10-CM

## 2011-11-29 DIAGNOSIS — E7800 Pure hypercholesterolemia, unspecified: Secondary | ICD-10-CM

## 2011-11-29 DIAGNOSIS — I6529 Occlusion and stenosis of unspecified carotid artery: Secondary | ICD-10-CM

## 2011-11-29 NOTE — Patient Instructions (Addendum)
Your physician wants you to follow-up in: 1 YEAR with Dr Angelena Form.  You will receive a reminder letter in the mail two months in advance. If you don't receive a letter, please call our office to schedule the follow-up appointment.  Your physician recommends that you continue on your current medications as directed. Please refer to the Current Medication list given to you today.

## 2011-11-29 NOTE — Progress Notes (Signed)
HPI:  Is in for followup. From a clinical standpoint he is really doing quite well. His granddaughters had a diagnosis requires a defibrillator (long QTc), and he will call to provide [place] more information. Overall however, he has no new symptoms. He's able to do what he wants without much difficulty.  Current Outpatient Prescriptions  Medication Sig Dispense Refill  . ACTOS 45 MG tablet 1 tab daily      . aspirin 81 MG tablet Take 81 mg by mouth daily.        [name] BENICAR 20 MG tablet 1 tab daily      . cholecalciferol (VITAMIN D) 400 UNITS TABS Take by mouth. VITAMIN D #3       . fish oil-omega-3 fatty acids 1000 MG capsule Take 1 g by mouth daily.        [name] glipiZIDE-metformin (METAGLIP) 5-500 MG per tablet 2 tabs twice a day      . metoprolol succinate (TOPROL-XL) 25 MG 24 hr tablet 1 tab daily      . Multiple Vitamin (MULTI-DAY VITAMINS PO) Take by mouth. 1 tab daily       . omeprazole (PRILOSEC) 20 MG capsule 1 tab twice a day      . simvastatin (ZOCOR) 20 MG tablet 1 tab daily      . vitamin C (ASCORBIC ACID) 500 MG tablet Take 500 mg by mouth daily.          No Known Allergies  Past Medical History  Diagnosis Date  . DIABETES MELLITUS, TYPE II   . HYPERCHOLESTEROLEMIA   . UNSPECIFIED THROMBOCYTOPENIA   . HYPERTENSION   . CAD, ARTERY BYPASS GRAFT   . PULMONARY HYPERTENSION   . CAROTID ARTERY STENOSIS   . RENAL INSUFFICIENCY, CHRONIC   . ANEMIA, HX OF   . Myocardial infarction June 2007    Past Surgical History  Procedure Date  . Coronary artery bypass graft   . Tonsillectomy     Family History  Problem Relation Age of Onset  . Diverticulitis    . Heart attack    . Heart disease Father 57    Heart disease before age 71  . Heart attack Father   . Hypertension Father     History   Social History  . Marital Status: Married    Spouse Name: [name]    Number of Children: [name]  . Years of Education: [name]   Occupational History  . Not on file.   Social History Main  Topics  . Smoking status: Former Smoker    Quit date: 03/26/1970  . Smokeless tobacco: Not on file  . Alcohol Use: 0.6 oz/week    1 Glasses of wine per week  . Drug Use: No  . Sexually Active: Not on file   Other Topics Concern  . Not on file   Social History Narrative  . No narrative on file    ROS: Please see the HPI.  All other systems reviewed and negative.  PHYSICAL EXAM:  BP 114/62  Pulse 52  Ht 6' (1.829 m)  Wt 226 lb (102.513 kg)  BMI 30.65 kg/m2  General: Well developed, well nourished, in no acute distress. Head:  Normocephalic and atraumatic. Neck: no JVD.  R carotid bruit.  Lungs: Clear to auscultation and percussion. Heart: Normal S1 and S2.  No murmur, rubs or gallops.  Abdomen:  Normal bowel sounds; soft; non tender; no organomegaly Pulses: Pulses normal in all 4 extremities. Extremities: No clubbing  or cyanosis. No edema. Neurologic: Alert and oriented x 3.  EKG:   NSR.  Anterolateral MI, old.  NO acute changes.  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  Not present on patient ECG.  They are receiving genetic mapping with [facility].

## 2011-12-03 ENCOUNTER — Telehealth: Payer: Self-pay | Admitting: Cardiology

## 2011-12-03 NOTE — Telephone Encounter (Signed)
I spoke with the pt and he said Dr Lia Foyer had asked him to call back with information about his grand-daughter's condition.  He said she has Long QT syndrome and has been seen by Dr Dia Sitter at the [facility].  I will forward this information to Dr Lia Foyer.

## 2011-12-03 NOTE — Telephone Encounter (Signed)
Pt was to call lauren when he had some info, is ready with the information, pls call cell [phone]

## 2011-12-17 NOTE — Assessment & Plan Note (Signed)
Managed by his primary care provider.

## 2011-12-17 NOTE — Assessment & Plan Note (Signed)
Some progression recently and followed by [facility].  Is on their call back lists.

## 2011-12-17 NOTE — Assessment & Plan Note (Signed)
Borderline last echo, probably reflects component of diastolic dysfunction.  Clinically stable.

## 2011-12-17 NOTE — Assessment & Plan Note (Signed)
Has continued to remain stable post CABG

## 2012-03-06 ENCOUNTER — Encounter: Payer: Self-pay | Admitting: Neurosurgery

## 2012-03-07 ENCOUNTER — Other Ambulatory Visit (INDEPENDENT_AMBULATORY_CARE_PROVIDER_SITE_OTHER): Payer: Medicare Other | Admitting: *Deleted

## 2012-03-07 ENCOUNTER — Encounter: Payer: Self-pay | Admitting: Neurosurgery

## 2012-03-07 ENCOUNTER — Ambulatory Visit (INDEPENDENT_AMBULATORY_CARE_PROVIDER_SITE_OTHER): Payer: Medicare Other | Admitting: Neurosurgery

## 2012-03-07 VITALS — BP 127/68 | HR 53 | Resp 16 | Ht 72.0 in | Wt 228.0 lb

## 2012-03-07 DIAGNOSIS — I6529 Occlusion and stenosis of unspecified carotid artery: Secondary | ICD-10-CM

## 2012-03-07 NOTE — Addendum Note (Signed)
Addended by: Mena Goes on: 03/07/2012 03:07 PM   Modules accepted: Orders

## 2012-03-07 NOTE — Progress Notes (Signed)
[facility] [place] Carotid Office Note  CC: Carotid surveillance Referring Physician: Kellie Simmering  History of Present Illness: 76 year old male patient of Dr. Kellie Simmering with no history of carotid intervention. The patient denies any signs or symptoms of CVA, TIA, amaurosis fugax or any neural deficit. The patient denies any new medical diagnoses or recent surgery.  Past Medical History  Diagnosis Date  . DIABETES MELLITUS, TYPE II   . HYPERCHOLESTEROLEMIA   . UNSPECIFIED THROMBOCYTOPENIA   . HYPERTENSION   . CAD, ARTERY BYPASS GRAFT   . PULMONARY HYPERTENSION   . CAROTID ARTERY STENOSIS   . RENAL INSUFFICIENCY, CHRONIC   . ANEMIA, HX OF   . Myocardial infarction June 2007    ROS: [x]  Positive   [ ]  Denies    General: [ ]  Weight loss, [ ]  Fever, [ ]  chills Neurologic: [ ]  Dizziness, [ ]  Blackouts, [ ]  Seizure [ ]  Stroke, [ ]  "Mini stroke", [ ]  Slurred speech, [ ]  Temporary blindness; [ ]  weakness in arms or legs, [ ]  Hoarseness Cardiac: [ ]  Chest pain/pressure, [ ]  Shortness of breath at rest [ ]  Shortness of breath with exertion, [ ]  Atrial fibrillation or irregular heartbeat Vascular: [ ]  Pain in legs with walking, [ ]  Pain in legs at rest, [ ]  Pain in legs at night,  [ ]  Non-healing ulcer, [ ]  Blood clot in vein/DVT,   Pulmonary: [ ]  Home oxygen, [ ]  Productive cough, [ ]  Coughing up blood, [ ]  Asthma,  [ ]  Wheezing Musculoskeletal:  [ ]  Arthritis, [ ]  Low back pain, [ ]  Joint pain Hematologic: [ ]  Easy Bruising, [ ]  Anemia; [ ]  Hepatitis Gastrointestinal: [ ]  Blood in stool, [ ]  Gastroesophageal Reflux/heartburn, [ ]  Trouble swallowing Urinary: [ ]  chronic Kidney disease, [ ]  on HD - [ ]  MWF or [ ]  TTHS, [ ]  Burning with urination, [ ]  Difficulty urinating Skin: [ ]  Rashes, [ ]  Wounds Psychological: [ ]  Anxiety, [ ]  Depression   Social History History  Substance Use Topics  . Smoking status: Former Smoker    Quit date: 03/26/1970  . Smokeless tobacco:  Never Used  . Alcohol Use: 0.6 oz/week    1 Glasses of wine per week    Family History Family History  Problem Relation Age of Onset  . Diverticulitis    . Heart attack    . Heart disease Father 35    Heart disease before age 85  . Heart attack Father   . Hypertension Father     No Known Allergies  Current Outpatient Prescriptions  Medication Sig Dispense Refill  . ACTOS 45 MG tablet 1 tab daily      . aspirin 81 MG tablet Take 81 mg by mouth daily.        [name] BENICAR 20 MG tablet 1 tab daily      . cholecalciferol (VITAMIN D) 400 UNITS TABS Take by mouth. VITAMIN D #3       . fish oil-omega-3 fatty acids 1000 MG capsule Take 1 g by mouth daily.        [name] glipiZIDE-metformin (METAGLIP) 5-500 MG per tablet 2 tabs twice a day      . metoprolol succinate (TOPROL-XL) 25 MG 24 hr tablet 1 tab daily      . Multiple Vitamin (MULTI-DAY VITAMINS PO) Take by mouth. 1 tab daily       . omeprazole (PRILOSEC) 20 MG capsule 1 tab twice a day      .  simvastatin (ZOCOR) 20 MG tablet 1 tab daily      . vitamin C (ASCORBIC ACID) 500 MG tablet Take 500 mg by mouth daily.          Physical Examination  Filed Vitals:   03/07/12 1351  BP: 127/68  Pulse: 53  Resp:     Body mass index is 30.92 kg/(m^2).  General:  WDWN in NAD Gait: Normal HEENT: WNL Eyes: Pupils equal Pulmonary: normal non-labored breathing , without Rales, rhonchi,  wheezing Cardiac: RRR, without  Murmurs, rubs or gallops; Abdomen: soft, NT, no masses Skin: no rashes, ulcers noted  Vascular Exam Pulses: 3+ radial pulses bilaterally Carotid bruits: Very mild carotid bruit heard on the right with a carotid pulse on the left Extremities without ischemic changes, no Gangrene , no cellulitis; no open wounds;  Musculoskeletal: no muscle wasting or atrophy   Neurologic: A&O X 3; Appropriate Affect ; SENSATION: normal; MOTOR FUNCTION:  moving all extremities equally. Speech is fluent/normal  Non-Invasive Vascular  Imaging CAROTID DUPLEX 03/07/2012  Right ICA 60 - 79 % stenosis Left ICA 40 - 59 % stenosis   ASSESSMENT/PLAN: Asymptomatic patient with stable implants right ICA stenosis and a slight advance in the left ICA stenosis. The patient will followup in 6 months with repeat carotid duplex. The patient's questions were encouraged and answered, he is in agreement with this plan.  Beatris Ship ANP   Clinic MD: Bridgett Larsson

## 2012-08-14 ENCOUNTER — Encounter: Payer: Self-pay | Admitting: Internal Medicine

## 2012-08-19 ENCOUNTER — Encounter: Payer: Self-pay | Admitting: Internal Medicine

## 2012-09-09 ENCOUNTER — Ambulatory Visit: Payer: Medicare Other | Admitting: Neurosurgery

## 2012-09-09 ENCOUNTER — Other Ambulatory Visit (INDEPENDENT_AMBULATORY_CARE_PROVIDER_SITE_OTHER): Payer: Medicare Other | Admitting: *Deleted

## 2012-09-09 DIAGNOSIS — I6529 Occlusion and stenosis of unspecified carotid artery: Secondary | ICD-10-CM

## 2012-09-12 ENCOUNTER — Other Ambulatory Visit: Payer: Self-pay

## 2012-09-17 ENCOUNTER — Encounter: Payer: Self-pay | Admitting: Vascular Surgery

## 2012-09-30 ENCOUNTER — Ambulatory Visit (AMBULATORY_SURGERY_CENTER): Payer: Medicare Other

## 2012-09-30 ENCOUNTER — Encounter: Payer: Self-pay | Admitting: Internal Medicine

## 2012-09-30 VITALS — Ht 71.0 in | Wt 225.2 lb

## 2012-09-30 DIAGNOSIS — Z86010 Personal history of colonic polyps: Secondary | ICD-10-CM

## 2012-09-30 DIAGNOSIS — Z800 Family history of malignant neoplasm of digestive organs: Secondary | ICD-10-CM

## 2012-09-30 MED ORDER — MOVIPREP 100 G PO SOLR: ORAL | Status: DC

## 2012-10-14 ENCOUNTER — Ambulatory Visit (AMBULATORY_SURGERY_CENTER): Payer: Medicare Other | Admitting: Internal Medicine

## 2012-10-14 ENCOUNTER — Encounter: Payer: Self-pay | Admitting: Internal Medicine

## 2012-10-14 VITALS — BP 135/69 | HR 54 | Temp 96.7°F | Resp 20 | Ht 71.0 in | Wt 225.0 lb

## 2012-10-14 DIAGNOSIS — Z86010 Personal history of colonic polyps: Secondary | ICD-10-CM

## 2012-10-14 LAB — GLUCOSE, CAPILLARY
Glucose-Capillary: 101 mg/dL — ABNORMAL HIGH (ref 70–99)
Glucose-Capillary: 106 mg/dL — ABNORMAL HIGH (ref 70–99)

## 2012-10-14 MED ORDER — SODIUM CHLORIDE 0.9 % IV SOLN: 500.0000 mL | INTRAVENOUS | Status: DC

## 2012-10-14 NOTE — Progress Notes (Signed)
Patient did not experience any of the following events: a burn prior to discharge; a fall within the facility; wrong site/side/patient/procedure/implant event; or a hospital transfer or hospital admission upon discharge from the facility. (G8907) Patient did not have preoperative order for IV antibiotic SSI prophylaxis. (G8918)  

## 2012-10-14 NOTE — Patient Instructions (Addendum)

## 2012-10-14 NOTE — Op Note (Signed)
[facility]  [address]. [place], [id number]   COLONOSCOPY PROCEDURE REPORT  PATIENT: [name], [name]  MR#: [id number] BIRTHDATE: 20-May-1935 , 77  yrs. old GENDER: Male ENDOSCOPIST: Eustace Quail, MD REFERRED [id number] Program Recall PROCEDURE DATE:  10/14/2012 PROCEDURE:   Colonoscopy, surveillance ASA CLASS:   Class II INDICATIONS:Patient's personal history of adenomatous colon polyps. Index 2008 [place] (small TA); Here 2011 (small adenomas, fair prep) MEDICATIONS: MAC sedation, administered by CRNA and propofol (Diprivan) 300mg  IV  DESCRIPTION OF PROCEDURE:   After the risks benefits and alternatives of the procedure were thoroughly explained, informed consent was obtained.  A digital rectal exam revealed no abnormalities of the rectum.   The LB [id number]  endoscope was introduced through the anus and advanced to the cecum, which was identified by both the appendix and ileocecal valve. No adverse events experienced.   The quality of the prep was adequate, using MoviPrep  The instrument was then slowly withdrawn as the colon was fully examined.      COLON FINDINGS: Moderate diverticulosis was noted in the sigmoid colon.   Mild melanosis was found throughout the entire examined colon.   The colon mucosa was otherwise normal.  Retroflexed views revealed internal hemorrhoids. The time to cecum=8 minutes 10 seconds.  Withdrawal time=14 minutes 59 seconds.  The scope was withdrawn and the procedure completed. COMPLICATIONS: There were no complications.  ENDOSCOPIC IMPRESSION: 1.   Moderate diverticulosis was noted in the sigmoid colon 2.   Mild melanosis was found throughout the entire examined colon 3.   The colon mucosa was otherwise normal  RECOMMENDATIONS: 1. Return to the care of your primary provider.  GI follow up as needed   eSigned:  Eustace Quail, MD 10/14/2012 12:35 PM   cc: Shirline Frees MD and The  Patient   PATIENT NAME:  [name], [name] MR#: [id number]

## 2012-10-15 ENCOUNTER — Telehealth: Payer: Self-pay | Admitting: *Deleted

## 2012-10-15 NOTE — Telephone Encounter (Signed)
  Follow up Call-  Call back number 10/14/2012  Post procedure Call Back phone  # [phone]     Patient questions:  Do you have a fever, pain , or abdominal swelling? no Pain Score  0 *  Have you tolerated food without any problems? yes  Have you been able to return to your normal activities? yes  Do you have any questions about your discharge instructions: Diet   no Medications  no Follow up visit  no  Do you have questions or concerns about your Care? no  Actions: * If pain score is 4 or above: No action needed, pain <4.

## 2012-10-29 ENCOUNTER — Other Ambulatory Visit: Payer: Self-pay

## 2012-12-01 ENCOUNTER — Ambulatory Visit (INDEPENDENT_AMBULATORY_CARE_PROVIDER_SITE_OTHER): Payer: Medicare Other | Admitting: Cardiovascular Disease

## 2012-12-01 ENCOUNTER — Encounter: Payer: Self-pay | Admitting: Cardiovascular Disease

## 2012-12-01 VITALS — BP 110/62 | HR 65 | Ht 72.0 in | Wt 229.0 lb

## 2012-12-01 DIAGNOSIS — E7800 Pure hypercholesterolemia, unspecified: Secondary | ICD-10-CM

## 2012-12-01 DIAGNOSIS — I6529 Occlusion and stenosis of unspecified carotid artery: Secondary | ICD-10-CM

## 2012-12-01 DIAGNOSIS — I2510 Atherosclerotic heart disease of native coronary artery without angina pectoris: Secondary | ICD-10-CM

## 2012-12-01 NOTE — Progress Notes (Signed)
History of Present Illness: 77 yo male with history of DM, HTN, HLD, CAD s/p 4V CABG in 2007, carotid artery disease here today for follow up. He has been followed by Dr. Lia Foyer. He underwent 4V CABG 2007 (LIMA to LAD, SVG to OM, SVG sequential to PLA/PDA). Last cath February 2010 with 4 patent grafts, severe native vessel disease. He is known to have mild pulmonary artery HTN. Carotid artery disease followed in [facility] by Dr. Kellie Simmering. Last carotid doppler June 2014 with 60-79% RICA stenosis, less than [id number] LICA stenosis.   He is here today for follow up. He has no chest pain or SOB. Energy level is good. He is working out 4-5 times per week on the treadmill and bike. No PND, orthopnea, LE edema.   Primary Care Physician: Shirline Frees  Last Lipid Profile: Followed in primary care.    Past Medical History  Diagnosis Date  . DIABETES MELLITUS, TYPE II   . HYPERCHOLESTEROLEMIA   . UNSPECIFIED THROMBOCYTOPENIA   . HYPERTENSION   . CAD, ARTERY BYPASS GRAFT   . PULMONARY HYPERTENSION   . CAROTID ARTERY STENOSIS   . RENAL INSUFFICIENCY, CHRONIC   . ANEMIA, HX OF   . Myocardial infarction June 2007  . Cataract   . Diverticulosis     Past Surgical History  Procedure Laterality Date  . Coronary artery bypass graft       quad bypass/2007  . Tonsillectomy    . Colonoscopy    . Polypectomy    . Cataract extraction  2012    right eye    Current Outpatient Prescriptions  Medication Sig Dispense Refill  . ACTOS 45 MG tablet 1 tab daily      . aspirin 81 MG tablet Take 81 mg by mouth daily.        [name] BENICAR 20 MG tablet 1 tab daily      . calcium-vitamin D (OSCAL WITH D) 500-200 MG-UNIT per tablet Take 1 tablet by mouth. Calcium 600 mg      . cholecalciferol (VITAMIN D) 400 UNITS TABS Take by mouth. VITAMIN D #3       . Coenzyme Q10 400 MG CAPS Take 300 mg by mouth.      . fish oil-omega-3 fatty acids 1000 MG capsule Take 1 g by mouth daily.        [name] glipiZIDE-metformin (METAGLIP) 5-500  MG per tablet 2 tabs twice a day      . metoprolol succinate (TOPROL-XL) 25 MG 24 hr tablet 1 tab daily      . MOVIPREP 100 G SOLR Moviprep as directed / no substitutions  1 kit  0  . Multiple Vitamin (MULTI-DAY VITAMINS PO) Take by mouth. 1 tab daily       . omeprazole (PRILOSEC) 20 MG capsule 1 tab twice a day      . simvastatin (ZOCOR) 20 MG tablet 1 tab daily      . vitamin C (ASCORBIC ACID) 500 MG tablet Take 500 mg by mouth daily.         No current facility-administered medications for this visit.    No Known Allergies  History   Social History  . Marital Status: Married    Spouse Name: [name]    Number of Children: [name]  . Years of Education: [name]   Occupational History  . Not on file.   Social History Main Topics  . Smoking status: Former Smoker    Types: Cigarettes    Quit  date: 03/26/1970  . Smokeless tobacco: Never Used  . Alcohol Use: 0.6 oz/week    1 Glasses of wine per week  . Drug Use: No  . Sexual Activity: Not on file   Other Topics Concern  . Not on file   Social History Narrative  . No narrative on file    Family History  Problem Relation Age of Onset  . Diverticulitis    . Heart attack    . Heart disease Father 51    Heart disease before age 28  . Heart attack Father   . Hypertension Father   . Colon cancer Maternal Aunt   . Colon cancer Maternal Uncle     Review of Systems:  As stated in the HPI and otherwise negative.   BP 110/62  Pulse 65  Ht 6' (1.829 m)  Wt 229 lb (103.874 kg)  BMI 31.05 kg/m2  SpO2 99%  Physical Examination: General: Well developed, well nourished, NAD HEENT: OP clear, mucus membranes moist SKIN: warm, dry. No rashes. Neuro: No focal deficits Musculoskeletal: Muscle strength 5/5 all ext Psychiatric: Mood and affect normal Neck: No JVD, no carotid bruits, no thyromegaly, no lymphadenopathy. Lungs:Clear bilaterally, no wheezes, rhonci, crackles Cardiovascular: Regular rate and rhythm. No murmurs, gallops or  rubs. Abdomen:Soft. Bowel sounds present. Non-tender.  Extremities: No lower extremity edema. Pulses are 2 + in the bilateral DP/PT.  EKG: Sinus brady, rate 55 bpm. Poor R wave progression anterolateral leads.   Cardiac cath 04/28/08:  1. The left main is free of critical disease.  2. The LAD has 70% narrowing and is calcified. It leads into a  smaller diagonal with about 60% mid narrowing. The mid LAD is  totally occluded. There is probably 50% involvement of the large  septal perforator.  3. The internal mammary to the LAD is widely patent. However, the LAD  distally itself is a very small-caliber vessel, nearly pencil thin  and suggestive of diffuse diabetic disease.  4. The circumflex has 80% narrowing, is diffusely diseased and totally  occluded.  5. The saphenous vein graft has at least 2 valves, but appears to be  widely patent inserting into the obtuse marginal system. The OM  system was without a critical focal narrowing, but does have  somewhat diabetic appearance.  6. The right coronary artery is moderately diseased throughout. There  is 30% narrowing proximally and 90% mid narrowing. There is  diffuse luminal irregularity throughout. There is competitive  filling of the PDA and diffuse disease after this leading into the  posterolateral system with at least 80% narrowing. There is some  filling of the distal vessel through the native circulation.  7. The saphenous vein graft to the PDA and PLA is intact and  retrograde fills the RCA system.  CONCLUSION:  1. Continued patency of the internal mammary to the LAD.  2. Continued patency of the saphenous vein graft to the OM.  3. Continued sequential saphenous vein graft to the PDA and PLA.  Assessment and Plan:   1. Coronary artery disease: Stable. Continue ASA, statin, beta blocker, Ace-inh.   2. Hyperlipidemia:  Managed by his primary care provider.    3. Carotid artery disease: Followed in [facility]

## 2012-12-01 NOTE — Patient Instructions (Addendum)
Your physician wants you to follow-up in:  6 months. You will receive a reminder letter in the mail two months in advance. If you don't receive a letter, please call our office to schedule the follow-up appointment.   

## 2013-01-29 ENCOUNTER — Other Ambulatory Visit: Payer: Self-pay

## 2013-03-24 ENCOUNTER — Other Ambulatory Visit: Payer: Medicare Other

## 2013-03-24 ENCOUNTER — Ambulatory Visit: Payer: Medicare Other | Admitting: Vascular Surgery

## 2013-03-26 DIAGNOSIS — E11319 Type 2 diabetes mellitus with unspecified diabetic retinopathy without macular edema: Secondary | ICD-10-CM

## 2013-03-26 HISTORY — DX: Type 2 diabetes mellitus with unspecified diabetic retinopathy without macular edema: E11.319

## 2013-03-30 ENCOUNTER — Encounter: Payer: Self-pay | Admitting: Vascular Surgery

## 2013-03-31 ENCOUNTER — Ambulatory Visit (INDEPENDENT_AMBULATORY_CARE_PROVIDER_SITE_OTHER): Payer: Medicare HMO | Admitting: Vascular Surgery

## 2013-03-31 ENCOUNTER — Ambulatory Visit (HOSPITAL_COMMUNITY)
Admission: RE | Admit: 2013-03-31 | Discharge: 2013-03-31 | Disposition: A | Discharge status: Home / Self Care | Payer: Medicare HMO | Source: Ambulatory Visit | Attending: Vascular Surgery | Admitting: Vascular Surgery

## 2013-03-31 ENCOUNTER — Encounter: Payer: Self-pay | Admitting: Vascular Surgery

## 2013-03-31 DIAGNOSIS — I6529 Occlusion and stenosis of unspecified carotid artery: Secondary | ICD-10-CM | POA: Insufficient documentation

## 2013-03-31 DIAGNOSIS — I658 Occlusion and stenosis of other precerebral arteries: Secondary | ICD-10-CM | POA: Insufficient documentation

## 2013-03-31 NOTE — Progress Notes (Signed)
The patient presents today for followup of his moderate to severe asymptomatic right internal carotid artery stenosis. He is right-handed. He denies any neurologic deficits specifically no amaurosis fugax, transient ischemic attack or stroke. He reports that he is also stable from a cardiac standpoint. He had his carotid stenosis initially discovered at the time of coronary bypass grafting in 2007 and has had serial ultrasounds since then.  Past Medical History  Diagnosis Date  . DIABETES MELLITUS, TYPE II   . HYPERCHOLESTEROLEMIA   . UNSPECIFIED THROMBOCYTOPENIA   . HYPERTENSION   . CAD, ARTERY BYPASS GRAFT   . PULMONARY HYPERTENSION   . CAROTID ARTERY STENOSIS   . RENAL INSUFFICIENCY, CHRONIC   . ANEMIA, HX OF   . Myocardial infarction June 2007  . Cataract   . Diverticulosis     History  Substance Use Topics  . Smoking status: Former Smoker    Types: Cigarettes    Quit date: 03/26/1970  . Smokeless tobacco: Never Used  . Alcohol Use: 0.6 oz/week    1 Glasses of wine per week    Family History  Problem Relation Age of Onset  . Diverticulitis    . Heart attack    . Heart disease Father 26    Heart disease before age 26  . Heart attack Father   . Hypertension Father   . Hyperlipidemia Father   . Varicose Veins Father   . Colon cancer Maternal Aunt   . Colon cancer Maternal Uncle     No Known Allergies  Current outpatient prescriptions:ACTOS 45 MG tablet, 1 tab daily, Disp: , Rfl: ;  aspirin 81 MG tablet, Take 81 mg by mouth daily.  , Disp: , Rfl: ;  BENICAR 20 MG tablet, 1 tab daily, Disp: , Rfl: ;  calcium-vitamin D (OSCAL WITH D) 500-200 MG-UNIT per tablet, Take 1 tablet by mouth. Calcium 600 mg, Disp: , Rfl: ;  cholecalciferol (VITAMIN D) 400 UNITS TABS, Take by mouth. VITAMIN D #3 , Disp: , Rfl:  Coenzyme Q10 400 MG CAPS, Take 300 mg by mouth., Disp: , Rfl: ;  fish oil-omega-3 fatty acids 1000 MG capsule, Take 1 g by mouth daily.  , Disp: , Rfl: ;  glipiZIDE-metformin  (METAGLIP) 5-500 MG per tablet, 2 tabs twice a day, Disp: , Rfl: ;  metoprolol succinate (TOPROL-XL) 25 MG 24 hr tablet, 1 tab daily, Disp: , Rfl: ;  Multiple Vitamin (MULTI-DAY VITAMINS PO), Take by mouth. 1 tab daily , Disp: , Rfl:  omeprazole (PRILOSEC) 20 MG capsule, 1 tab twice a day, Disp: , Rfl: ;  simvastatin (ZOCOR) 20 MG tablet, 1 tab daily, Disp: , Rfl: ;  vitamin C (ASCORBIC ACID) 500 MG tablet, Take 500 mg by mouth daily.  , Disp: , Rfl: ;  MOVIPREP 100 G SOLR, Moviprep as directed / no substitutions, Disp: 1 kit, Rfl: 0  BP 136/44  Pulse 53  Ht 6' (1.829 m)  Wt 230 lb (104.327 kg)  BMI 31.19 kg/m2  SpO2 100%  Body mass index is 31.19 kg/(m^2).       Physical exam: Well-developed well-nourished white male in no acute distress Carotid arteries are without bruits bilaterally Heart is regular rate and rhythm without murmur Chest clear with equal breath sounds bilaterally Neurologically he is grossly intact  Carotid duplex today was reviewed and interpreted by myself. This does show no change in his 60-79% stenosis in the right internal carotid and less than 40% stenosis in the left internal carotid.  Impression and plan: Stable followup of asymptomatic carotid disease. I again this arrived symptoms of carotid disease and the patient. He'll notify me should this occur. Otherwise we will see him at 6 month intervals with repeat duplex

## 2013-03-31 NOTE — Addendum Note (Signed)
Addended by: Mena Goes on: 03/31/2013 01:01 PM   Modules accepted: Orders

## 2013-05-07 ENCOUNTER — Encounter: Payer: Self-pay | Admitting: Cardiovascular Disease

## 2013-05-14 ENCOUNTER — Other Ambulatory Visit: Payer: Self-pay | Admitting: *Deleted

## 2013-05-14 MED ORDER — LOSARTAN POTASSIUM 50 MG PO TABS: 50.0000 mg | ORAL_TABLET | Freq: Every day | ORAL | Status: DC

## 2013-06-02 ENCOUNTER — Ambulatory Visit (INDEPENDENT_AMBULATORY_CARE_PROVIDER_SITE_OTHER): Payer: Commercial Managed Care - HMO | Admitting: Cardiovascular Disease

## 2013-06-02 ENCOUNTER — Encounter: Payer: Self-pay | Admitting: Cardiovascular Disease

## 2013-06-02 VITALS — BP 110/60 | HR 53 | Ht 72.0 in | Wt 236.0 lb

## 2013-06-02 DIAGNOSIS — E7800 Pure hypercholesterolemia, unspecified: Secondary | ICD-10-CM

## 2013-06-02 DIAGNOSIS — I6529 Occlusion and stenosis of unspecified carotid artery: Secondary | ICD-10-CM

## 2013-06-02 DIAGNOSIS — I2510 Atherosclerotic heart disease of native coronary artery without angina pectoris: Secondary | ICD-10-CM

## 2013-06-02 DIAGNOSIS — I10 Essential (primary) hypertension: Secondary | ICD-10-CM

## 2013-06-02 NOTE — Progress Notes (Signed)
History of Present Illness: 78 yo male with history of DM, HTN, HLD, CAD s/p 4V CABG in 2007, carotid artery disease here today for follow up. He has been followed by Dr. Lia Foyer. He underwent 4V CABG 2007 (LIMA to LAD, SVG to OM, SVG sequential to PLA/PDA). Last cath February 2010 with 4 patent grafts, severe native vessel disease. He is known to have mild pulmonary artery HTN. Carotid artery disease followed in [facility] by Dr. Kellie Simmering. Last carotid doppler 03/31/13 with 60-79% RICA stenosis, less than [id number] LICA stenosis.   He is here today for follow up. He has no chest pain or SOB. Energy level is good. He is working out 4-5 times per week on the treadmill and bike. No PND, orthopnea, LE edema.   Primary Care Physician: Shirline Frees  Last Lipid Profile: Followed in primary care.   Past Medical History  Diagnosis Date  . DIABETES MELLITUS, TYPE II   . HYPERCHOLESTEROLEMIA   . UNSPECIFIED THROMBOCYTOPENIA   . HYPERTENSION   . CAD, ARTERY BYPASS GRAFT   . PULMONARY HYPERTENSION   . CAROTID ARTERY STENOSIS   . RENAL INSUFFICIENCY, CHRONIC   . ANEMIA, HX OF   . Myocardial infarction June 2007  . Cataract   . Diverticulosis     Past Surgical History  Procedure Laterality Date  . Coronary artery bypass graft       quad bypass/2007  . Tonsillectomy    . Colonoscopy    . Polypectomy    . Cataract extraction  2012    right eye    Current Outpatient Prescriptions  Medication Sig Dispense Refill  . ACTOS 45 MG tablet 1 tab daily      . aspirin 81 MG tablet Take 81 mg by mouth daily.        . calcium-vitamin D (OSCAL WITH D) 500-200 MG-UNIT per tablet Take 1 tablet by mouth. Calcium 600 mg      . cholecalciferol (VITAMIN D) 400 UNITS TABS Take by mouth. VITAMIN D #3       . Coenzyme Q10 400 MG CAPS Take 300 mg by mouth.      . fish oil-omega-3 fatty acids 1000 MG capsule Take 1 g by mouth daily.        [name] glipiZIDE-metformin (METAGLIP) 5-500 MG per tablet 2 tabs twice a day      .  losartan (COZAAR) 50 MG tablet Take 1 tablet (50 mg total) by mouth daily.  90 tablet  3  . metoprolol succinate (TOPROL-XL) 25 MG 24 hr tablet 1 tab daily      . Multiple Vitamin (MULTI-DAY VITAMINS PO) Take by mouth. 1 tab daily       . omeprazole (PRILOSEC) 20 MG capsule 1 tab twice a day      . simvastatin (ZOCOR) 20 MG tablet 1 tab daily      . vitamin C (ASCORBIC ACID) 500 MG tablet Take 500 mg by mouth daily.         No current facility-administered medications for this visit.    No Known Allergies  History   Social History  . Marital Status: Married    Spouse Name: [name]    Number of Children: 2  . Years of Education: [name]   Occupational History  . Retired-sales    Social History Main Topics  . Smoking status: Former Smoker    Types: Cigarettes    Quit date: 03/26/1970  . Smokeless tobacco: Never Used  . Alcohol  Use: 0.6 oz/week    1 Glasses of wine per week  . Drug Use: No  . Sexual Activity: Not on file   Other Topics Concern  . Not on file   Social History Narrative  . No narrative on file    Family History  Problem Relation Age of Onset  . Diverticulitis    . Heart attack    . Heart disease Father 57    Heart disease before age 65  . Heart attack Father   . Hypertension Father   . Hyperlipidemia Father   . Varicose Veins Father   . Colon cancer Maternal Aunt   . Colon cancer Maternal Uncle     Review of Systems:  As stated in the HPI and otherwise negative.   BP 110/60  Pulse 53  Ht 6' (1.829 m)  Wt 236 lb (107.049 kg)  BMI 32.00 kg/m2  Physical Examination: General: Well developed, well nourished, NAD HEENT: OP clear, mucus membranes moist SKIN: warm, dry. No rashes. Neuro: No focal deficits Musculoskeletal: Muscle strength 5/5 all ext Psychiatric: Mood and affect normal Neck: No JVD, no carotid bruits, no thyromegaly, no lymphadenopathy. Lungs:Clear bilaterally, no wheezes, rhonci, crackles Cardiovascular: Regular rate and rhythm. No  murmurs, gallops or rubs. Abdomen:Soft. Bowel sounds present. Non-tender.  Extremities: No lower extremity edema. Pulses are 2 + in the bilateral DP/PT.  Cardiac cath 04/28/08:  1. The left main is free of critical disease.  2. The LAD has 70% narrowing and is calcified. It leads into a  smaller diagonal with about 60% mid narrowing. The mid LAD is  totally occluded. There is probably 50% involvement of the large  septal perforator.  3. The internal mammary to the LAD is widely patent. However, the LAD  distally itself is a very small-caliber vessel, nearly pencil thin  and suggestive of diffuse diabetic disease.  4. The circumflex has 80% narrowing, is diffusely diseased and totally  occluded.  5. The saphenous vein graft has at least 2 valves, but appears to be  widely patent inserting into the obtuse marginal system. The OM  system was without a critical focal narrowing, but does have  somewhat diabetic appearance.  6. The right coronary artery is moderately diseased throughout. There  is 30% narrowing proximally and 90% mid narrowing. There is  diffuse luminal irregularity throughout. There is competitive  filling of the PDA and diffuse disease after this leading into the  posterolateral system with at least 80% narrowing. There is some  filling of the distal vessel through the native circulation.  7. The saphenous vein graft to the PDA and PLA is intact and  retrograde fills the RCA system.  CONCLUSION:  1. Continued patency of the internal mammary to the LAD.  2. Continued patency of the saphenous vein graft to the OM.  3. Continued sequential saphenous vein graft to the PDA and PLA.  Assessment and Plan:   1. Coronary artery disease: Stable. Continue ASA, statin, beta blocker, Ace-inh.   2. Hyperlipidemia:  He is on a statin. Lipids followed in primary care.   3. Carotid artery disease: Followed in [facility]  4. HTN: BP controlled. No changes today.

## 2013-06-02 NOTE — Patient Instructions (Signed)
Your physician wants you to follow-up in:  12 months.  You will receive a reminder letter in the mail two months in advance. If you don't receive a letter, please call our office to schedule the follow-up appointment.   

## 2013-09-29 ENCOUNTER — Ambulatory Visit: Payer: Medicare HMO | Admitting: Family

## 2013-09-29 ENCOUNTER — Other Ambulatory Visit (HOSPITAL_COMMUNITY): Payer: Medicare HMO

## 2013-10-05 ENCOUNTER — Encounter: Payer: Self-pay | Admitting: Family

## 2013-10-06 ENCOUNTER — Ambulatory Visit (HOSPITAL_COMMUNITY)
Admission: RE | Admit: 2013-10-06 | Discharge: 2013-10-06 | Disposition: A | Discharge status: Home / Self Care | Payer: Medicare HMO | Source: Ambulatory Visit | Attending: Family | Admitting: Family

## 2013-10-06 ENCOUNTER — Encounter: Payer: Self-pay | Admitting: Family

## 2013-10-06 ENCOUNTER — Ambulatory Visit (INDEPENDENT_AMBULATORY_CARE_PROVIDER_SITE_OTHER): Payer: Commercial Managed Care - HMO | Admitting: Family

## 2013-10-06 VITALS — BP 126/70 | HR 52 | Resp 16 | Ht 71.0 in | Wt 215.0 lb

## 2013-10-06 DIAGNOSIS — I6529 Occlusion and stenosis of unspecified carotid artery: Secondary | ICD-10-CM

## 2013-10-06 NOTE — Patient Instructions (Signed)
Stroke Prevention Some medical conditions and behaviors are associated with an increased chance of having a stroke. You may prevent a stroke by making healthy choices and managing medical conditions. HOW CAN I REDUCE MY RISK OF HAVING A STROKE?   Stay physically active. Get at least 30 minutes of activity on most or all days.  Do not smoke. It may also be helpful to avoid exposure to secondhand smoke.  Limit alcohol use. Moderate alcohol use is considered to be:  No more than 2 drinks per day for men.  No more than 1 drink per day for nonpregnant women.  Eat healthy foods. This involves  Eating 5 or more servings of fruits and vegetables a day.  Following a diet that addresses high blood pressure (hypertension), high cholesterol, diabetes, or obesity.  Manage your cholesterol levels.  A diet low in saturated fat, trans fat, and cholesterol and high in fiber may control cholesterol levels.  Take any prescribed medicines to control cholesterol as directed by your health care provider.  Manage your diabetes.  A controlled-carbohydrate, controlled-sugar diet is recommended to manage diabetes.  Take any prescribed medicines to control diabetes as directed by your health care provider.  Control your hypertension.  A low-salt (sodium), low-saturated fat, low-trans fat, and low-cholesterol diet is recommended to manage hypertension.  Take any prescribed medicines to control hypertension as directed by your health care provider.  Maintain a healthy weight.  A reduced-calorie, low-sodium, low-saturated fat, low-trans fat, low-cholesterol diet is recommended to manage weight.  Stop drug abuse.  Avoid taking birth control pills.  Talk to your health care provider about the risks of taking birth control pills if you are over 35 years old, smoke, get migraines, or have ever had a blood clot.  Get evaluated for sleep disorders (sleep apnea).  Talk to your health care provider about  getting a sleep evaluation if you snore a lot or have excessive sleepiness.  Take medicines as directed by your health care provider.  For some people, aspirin or blood thinners (anticoagulants) are helpful in reducing the risk of forming abnormal blood clots that can lead to stroke. If you have the irregular heart rhythm of atrial fibrillation, you should be on a blood thinner unless there is a good reason you cannot take them.  Understand all your medicine instructions.  Make sure that other other conditions (such as anemia or atherosclerosis) are addressed. SEEK IMMEDIATE MEDICAL CARE IF:   You have sudden weakness or numbness of the face, arm, or leg, especially on one side of the body.  Your face or eyelid droops to one side.  You have sudden confusion.  You have trouble speaking (aphasia) or understanding.  You have sudden trouble seeing in one or both eyes.  You have sudden trouble walking.  You have dizziness.  You have a loss of balance or coordination.  You have a sudden, severe headache with no known cause.  You have new chest pain or an irregular heartbeat. Any of these symptoms may represent a serious problem that is an emergency. Do not wait to see if the symptoms will go away. Get medical help at once. Call your local emergency services  (911 in [place] Do not drive yourself to the hospital. Document Released: 04/19/2004 Document Revised: 12/31/2012 Document Reviewed: 09/12/2012 [employer] Patient Information 2015 [employer]. This information is not intended to replace advice given to you by your health care provider. Make sure you discuss any questions you have with your health   care provider.  

## 2013-10-06 NOTE — Addendum Note (Signed)
Addended by: Mena Goes on: 10/06/2013 01:06 PM   Modules accepted: Orders

## 2013-10-06 NOTE — Progress Notes (Signed)
Established Carotid Patient   History of Present Illness  [name] is a 78 y.o. male patient of Dr. Donnetta Hutching who presents today for followup of his moderate to severe asymptomatic right internal carotid artery stenosis. He is right-handed. He denies any neurologic deficits specifically no amaurosis fugax, transient ischemic attack or stroke. He reports that he is also stable from a cardiac standpoint. He had his carotid stenosis initially discovered at the time of coronary bypass grafting in 2007 and has had serial ultrasounds since then.  Patient has not had previous carotid artery intervention. He denies tingling, weakness, or pain in either hand or arm, denies dizziness with raising arms above his head.  He denies claudication symptoms in his legs with walking, denies non healing wounds. He works out at [employer] 4 days/week.  Patient has Negative history of TIA or stroke symptom.  The patient denies amaurosis fugax or monocular blindness.  The patient  denies facial drooping.  Pt. denies hemiplegia.  The patient denies receptive or expressive aphasia.  Pt. denies extremity weakness.  Pt  denies New Medical or Surgical History.  Pt Diabetic: Yes, states his A1C was less than 6.0 recently Pt smoker: former smoker, quit in 1972  Pt meds include: Statin : Yes ASA: Yes Other anticoagulants/antiplatelets: no   Past Medical History  Diagnosis Date  . DIABETES MELLITUS, TYPE II   . HYPERCHOLESTEROLEMIA   . UNSPECIFIED THROMBOCYTOPENIA   . HYPERTENSION   . CAD, ARTERY BYPASS GRAFT   . PULMONARY HYPERTENSION   . CAROTID ARTERY STENOSIS   . RENAL INSUFFICIENCY, CHRONIC   . ANEMIA, HX OF   . Myocardial infarction June 2007  . Cataract   . Diverticulosis     Social History History  Substance Use Topics  . Smoking status: Former Smoker    Types: Cigarettes    Quit date: 03/26/1970  . Smokeless tobacco: Never Used  . Alcohol Use: 0.6 oz/week    1 Glasses of wine per week     Family History Family History  Problem Relation Age of Onset  . Diverticulitis    . Heart attack    . Heart disease Father 73    Heart disease before age 62  . Heart attack Father   . Hypertension Father   . Hyperlipidemia Father   . Varicose Veins Father   . Colon cancer Maternal Aunt   . Colon cancer Maternal Uncle     Surgical History Past Surgical History  Procedure Laterality Date  . Coronary artery bypass graft       quad bypass/2007  . Tonsillectomy    . Colonoscopy    . Polypectomy    . Cataract extraction  2012    right eye    No Known Allergies  Current Outpatient Prescriptions  Medication Sig Dispense Refill  . ACTOS 45 MG tablet 1 tab daily      . aspirin 81 MG tablet Take 81 mg by mouth daily.        . calcium-vitamin D (OSCAL WITH D) 500-200 MG-UNIT per tablet Take 1 tablet by mouth. Calcium 600 mg      . cholecalciferol (VITAMIN D) 400 UNITS TABS Take by mouth. VITAMIN D #3       . Coenzyme Q10 400 MG CAPS Take 300 mg by mouth.      . fish oil-omega-3 fatty acids 1000 MG capsule Take 1 g by mouth daily.        [name] glipiZIDE-metformin (METAGLIP) 5-500  MG per tablet 2 tabs twice a day      . losartan (COZAAR) 50 MG tablet Take 1 tablet (50 mg total) by mouth daily.  90 tablet  3  . metoprolol succinate (TOPROL-XL) 25 MG 24 hr tablet 1 tab daily      . Multiple Vitamin (MULTI-DAY VITAMINS PO) Take by mouth. 1 tab daily       . omeprazole (PRILOSEC) 20 MG capsule 1 tab twice a day      . simvastatin (ZOCOR) 20 MG tablet 1 tab daily      . vitamin C (ASCORBIC ACID) 500 MG tablet Take 500 mg by mouth daily.         No current facility-administered medications for this visit.    Review of Systems : See HPI for pertinent positives and negatives.  Physical Examination   Filed Vitals:   10/06/13 0959 10/06/13 1000  BP: 135/69 126/70  Pulse: 54 52  Resp: 16   Height: 5\' 11"  (1.803 m)   Weight: 215 lb (97.523 kg)    Body mass index is 30  kg/(m^2).  General: WDWN male in NAD GAIT: normal Eyes: PERRLA Pulmonary:  Non-labored, CTAB, Negative  Rales, Negative rhonchi, & Negative wheezing.  Cardiac: regular Rhythm ,  Negative detected murmur.  VASCULAR EXAM Carotid Bruits Left Right   Negative Negative    Radial pulses are 2+ palpable and equal.                                                                                                                       Gastrointestinal: soft, nontender, BS WNL, no r/g,  negative masses.  Musculoskeletal: Negative muscle atrophy/wasting. M/S 5/5 throughout, Extremities without ischemic changes.  Neurologic: A&O X 3; Appropriate Affect ; SENSATION ;normal;  Speech is normal CN 2-12 intact, Pain and light touch intact in extremities, Motor exam as listed above.   Non-Invasive Vascular Imaging CAROTID DUPLEX 10/06/2013 CEREBROVASCULAR DUPLEX EVALUATION    INDICATION: Carotid disease    PREVIOUS INTERVENTION(S):     DUPLEX EXAM:     RIGHT  LEFT  Peak Systolic Velocities (cm/s) End Diastolic Velocities (cm/s) Plaque LOCATION Peak Systolic Velocities (cm/s) End Diastolic Velocities (cm/s) Plaque  122 17  CCA PROXIMAL 107 21   139 20  CCA MID 90 22 HT  119 24 HT CCA DISTAL 99 25   206 0  ECA 113 11   278 66 HT ICA PROXIMAL 75 17 CP  96 28  ICA MID 79 29   72 25  ICA DISTAL 77 32     2.3 ICA / CCA Ratio (PSV) 0.76  Antegrade Vertebral Flow Antegrade  123456 Brachial Systolic Pressure (mmHg) 123456  Multiphasic (subclavian artery) Brachial Artery Waveforms Multiphasic (subclavian artery)    Plaque Morphology:  HM = Homogeneous, HT = Heterogeneous, CP = Calcific Plaque, SP = Smooth Plaque, IP = Irregular Plaque     ADDITIONAL FINDINGS: No significant stenosis of the bilateral external or common carotid  arteries.    IMPRESSION: Doppler velocities suggest a 60-79% stenosis of the right proximal internal carotid artery and a less than 40% stenosis of the left proximal internal  carotid artery.    Compared to the previous exam:  No significant change noted when compared to the previous exam on 03/31/13.       Assessment: [name] is a 78 y.o. male who presents with asymptomatic  60-79% stenosis of the right proximal internal carotid artery and a less than 40% stenosis of the left proximal internal carotid artery. The  ICA stenosis is  Unchanged from previous exam.  Plan: Follow-up in 6 months with Carotid Duplex scan.   I discussed in depth with the patient the nature of atherosclerosis, and emphasized the importance of maximal medical management including strict control of blood pressure, blood glucose, and lipid levels, obtaining regular exercise, and continued cessation of smoking.  The patient is aware that without maximal medical management the underlying atherosclerotic disease process will progress, limiting the benefit of any interventions. The patient was given information about stroke prevention and what symptoms should prompt the patient to seek immediate medical care. Thank you for allowing [place] to participate in this patient's care.  Clemon Chambers, RN, MSN, FNP-C [facility] Office: [phone]  Clinic Physician: Kellie Simmering  10/06/2013 10:15 AM

## 2014-03-26 DIAGNOSIS — J180 Bronchopneumonia, unspecified organism: Secondary | ICD-10-CM

## 2014-03-26 HISTORY — DX: Bronchopneumonia, unspecified organism: J18.0

## 2014-03-29 DIAGNOSIS — R05 Cough: Secondary | ICD-10-CM | POA: Diagnosis not present

## 2014-04-05 ENCOUNTER — Encounter: Payer: Self-pay | Admitting: Family

## 2014-04-06 ENCOUNTER — Other Ambulatory Visit: Payer: Self-pay | Admitting: Family

## 2014-04-06 ENCOUNTER — Ambulatory Visit (INDEPENDENT_AMBULATORY_CARE_PROVIDER_SITE_OTHER): Payer: Commercial Managed Care - HMO | Admitting: Family

## 2014-04-06 ENCOUNTER — Encounter: Payer: Self-pay | Admitting: Family

## 2014-04-06 ENCOUNTER — Ambulatory Visit (HOSPITAL_COMMUNITY)
Admission: RE | Admit: 2014-04-06 | Discharge: 2014-04-06 | Disposition: A | Discharge status: Home / Self Care | Payer: Commercial Managed Care - HMO | Source: Ambulatory Visit | Attending: Family | Admitting: Family

## 2014-04-06 VITALS — BP 161/66 | HR 70 | Resp 16 | Ht 71.0 in | Wt 211.0 lb

## 2014-04-06 DIAGNOSIS — I6523 Occlusion and stenosis of bilateral carotid arteries: Secondary | ICD-10-CM

## 2014-04-06 DIAGNOSIS — E70311 Autosomal recessive ocular albinism: Secondary | ICD-10-CM

## 2014-04-06 DIAGNOSIS — I872 Venous insufficiency (chronic) (peripheral): Secondary | ICD-10-CM

## 2014-04-06 NOTE — Progress Notes (Signed)
Established Carotid Patient   History of Present Illness  [name] is a 79 y.o. male patient of Dr. Donnetta Hutching who presents today for followup of his moderate to severe asymptomatic right internal carotid artery stenosis. He is right-handed. He denies any neurologic deficits specifically no amaurosis fugax, transient ischemic attack or stroke. He reports that he is also stable from a cardiac standpoint. He had his carotid stenosis initially discovered at the time of coronary bypass grafting in 2007 and has had serial ultrasounds since then.  Patient has not had previous carotid artery intervention. He denies tingling, weakness, or pain in either hand or arm, denies dizziness with raising arms above his head.  He denies claudication symptoms in his legs with walking, denies non healing wounds. He works out at a gym 4 days/week.  Patient has Negative history of TIA or stroke symptom. The patient denies amaurosis fugax or monocular blindness. The patient denies facial drooping. Pt. denies hemiplegia. The patient denies receptive or expressive aphasia. Pt. denies extremity weakness.  Pt reports New Medical or Surgical History: diagnosed with bronchial pneumonia last week, taking antibiotics and feels much better.  Pt Diabetic: Yes, states his A1C was less than 6.3 recently Pt smoker: former smoker, quit in 1972  Pt meds include: Statin : Yes ASA: Yes Other anticoagulants/antiplatelets: no   Past Medical History  Diagnosis Date  . DIABETES MELLITUS, TYPE II   . HYPERCHOLESTEROLEMIA   . UNSPECIFIED THROMBOCYTOPENIA   . HYPERTENSION   . CAD, ARTERY BYPASS GRAFT   . PULMONARY HYPERTENSION   . CAROTID ARTERY STENOSIS   . RENAL INSUFFICIENCY, CHRONIC   . ANEMIA, HX OF   . Myocardial infarction June 2007  . Cataract   . Diverticulosis   . Diabetic retinal damage of right eye 2015  . Bronchial pneumonia Jan. 2016    Social History History  Substance Use Topics  .  Smoking status: Former Smoker    Types: Cigarettes    Quit date: 03/26/1970  . Smokeless tobacco: Never Used  . Alcohol Use: 0.6 oz/week    1 Glasses of wine per week    Family History Family History  Problem Relation Age of Onset  . Diverticulitis    . Heart attack    . Heart disease Father 93    Heart disease before age 94  . Heart attack Father   . Hypertension Father   . Hyperlipidemia Father   . Varicose Veins Father   . Stroke Father     ? not sure  . Colon cancer Maternal Aunt   . Colon cancer Maternal Uncle     Surgical History Past Surgical History  Procedure Laterality Date  . Coronary artery bypass graft       quad bypass/2007  . Tonsillectomy    . Colonoscopy    . Polypectomy    . Cataract extraction  2012    right eye  . Eye surgery      No Known Allergies  Current Outpatient Prescriptions  Medication Sig Dispense Refill  . ACTOS 45 MG tablet 1 tab daily    . aspirin 81 MG tablet Take 81 mg by mouth daily.      . calcium-vitamin D (OSCAL WITH D) 500-200 MG-UNIT per tablet Take 1 tablet by mouth. Calcium 600 mg    . cholecalciferol (VITAMIN D) 400 UNITS TABS Take by mouth. VITAMIN D #3     . Coenzyme Q10 400 MG CAPS Take 300 mg by mouth.    [name]  fish oil-omega-3 fatty acids 1000 MG capsule Take 1 g by mouth daily.      [name] glipiZIDE-metformin (METAGLIP) 5-500 MG per tablet 2 tabs twice a day    . levofloxacin (LEVAQUIN) 500 MG tablet Take 500 mg by mouth daily.  0  . losartan (COZAAR) 50 MG tablet Take 1 tablet (50 mg total) by mouth daily. 90 tablet 3  . metoprolol succinate (TOPROL-XL) 25 MG 24 hr tablet 1 tab daily    . Multiple Vitamin (MULTI-DAY VITAMINS PO) Take by mouth. 1 tab daily     . omeprazole (PRILOSEC) 20 MG capsule 1 tab twice a day    . simvastatin (ZOCOR) 20 MG tablet 1 tab daily    . vitamin C (ASCORBIC ACID) 500 MG tablet Take 500 mg by mouth daily.       No current facility-administered medications for this visit.    Review of  Systems : See HPI for pertinent positives and negatives.  Physical Examination  Filed Vitals:   04/06/14 1207 04/06/14 1216  BP: 143/73 161/66  Pulse: 68 70  Resp:  16  Height:  5\' 11"  (1.803 m)  Weight:  211 lb (95.709 kg)  SpO2:  100%   Body mass index is 29.44 kg/(m^2).  General: WDWN male in NAD GAIT: normal Eyes: PERRLA Pulmonary: Non-labored, CTAB, Negative Rales, Negative rhonchi, & Negative wheezing.  Cardiac: regular Rhythm , Negative detected murmur.  VASCULAR EXAM Carotid Bruits Left Right   Negative Negative   Radial pulses are 2+ palpable and equal.     Gastrointestinal: soft, nontender, BS WNL, no r/g, negative masses.  Musculoskeletal: Negative muscle atrophy/wasting. M/S 5/5 throughout, Extremities without ischemic changes.  Neurologic: A&O X 3; Appropriate Affect ; SENSATION ;normal;  Speech is normal CN 2-12 intact, Pain and light touch intact in extremities, Motor exam as listed above.   Non-Invasive Vascular Imaging CAROTID DUPLEX 04/06/2014   CEREBROVASCULAR DUPLEX EVALUATION    INDICATION: Carotid artery stenosis    PREVIOUS INTERVENTION(S): NA    DUPLEX EXAM:     RIGHT  LEFT  Peak Systolic Velocities (cm/s) End Diastolic Velocities (cm/s) Plaque LOCATION Peak Systolic Velocities (cm/s) End Diastolic Velocities (cm/s) Plaque  113 15  CCA PROXIMAL 119 19   109 18  CCA MID 78 13   115 21 HT CCA DISTAL 81 20 HT  108 17 HT ECA 96 7 HT  286 68 HT ICA PROXIMAL 83 21 HT  101 24  ICA MID 114 35   80 24  ICA DISTAL 102 41     2.62 ICA / CCA Ratio (PSV) 1.06  Antegrade Vertebral Flow Antegrade  [id number] Brachial Systolic Pressure (mmHg) 0000000  Triphasic Brachial Artery Waveforms Triphasic    Plaque Morphology:  HM = Homogeneous, HT = Heterogeneous, CP = Calcific Plaque, SP = Smooth Plaque, IP = Irregular Plaque      ADDITIONAL FINDINGS:     IMPRESSION: Right internal carotid artery stenosis present in the 60%-79% range. Left internal carotid artery stenosis present in the less than 40% range.    Compared to the previous exam:  Stable and unchanged since previous study on 10-06-13.      Assessment: [name] is a 79 y.o. male who presents with asymptomatic 60%-79% right ICA stenosis and less than 40% left ICA stenosis. Stable and unchanged since previous study on 10-06-13. Chronic venous insufficiency in both LE's, see Plan. Face to face time with patient was 25 minutes. Over 50% of this  time was spent on counseling and coordination of care.    Plan: Follow-up in 6 months with Carotid Duplex. Chronic venous insufficiency: Graduate knee high compression hose, 20-30 mm Hg, measure to fit, wear during the day, remove at bedtime.   I discussed in depth with the patient the nature of atherosclerosis, and emphasized the importance of maximal medical management including strict control of blood pressure, blood glucose, and lipid levels, obtaining regular exercise, and continued cessation of smoking.  The patient is aware that without maximal medical management the underlying atherosclerotic disease process will progress, limiting the benefit of any interventions. The patient was given information about stroke prevention and what symptoms should prompt the patient to seek immediate medical care. Thank you for allowing [place] to participate in this patient's care.  Clemon Chambers, RN, MSN, FNP-C [facility] Office: [phone]  Clinic Physician: Early  04/06/2014 12:25 PM

## 2014-04-06 NOTE — Patient Instructions (Addendum)
Stroke Prevention Some medical conditions and behaviors are associated with an increased chance of having a stroke. You may prevent a stroke by making healthy choices and managing medical conditions. HOW CAN I REDUCE MY RISK OF HAVING A STROKE?   Stay physically active. Get at least 30 minutes of activity on most or all days.  Do not smoke. It may also be helpful to avoid exposure to secondhand smoke.  Limit alcohol use. Moderate alcohol use is considered to be:  No more than 2 drinks per day for men.  No more than 1 drink per day for nonpregnant women.  Eat healthy foods. This involves:  Eating 5 or more servings of fruits and vegetables a day.  Making dietary changes that address high blood pressure (hypertension), high cholesterol, diabetes, or obesity.  Manage your cholesterol levels.  Making food choices that are high in fiber and low in saturated fat, trans fat, and cholesterol may control cholesterol levels.  Take any prescribed medicines to control cholesterol as directed by your health care provider.  Manage your diabetes.  Controlling your carbohydrate and sugar intake is recommended to manage diabetes.  Take any prescribed medicines to control diabetes as directed by your health care provider.  Control your hypertension.  Making food choices that are low in salt (sodium), saturated fat, trans fat, and cholesterol is recommended to manage hypertension.  Take any prescribed medicines to control hypertension as directed by your health care provider.  Maintain a healthy weight.  Reducing calorie intake and making food choices that are low in sodium, saturated fat, trans fat, and cholesterol are recommended to manage weight.  Stop drug abuse.  Avoid taking birth control pills.  Talk to your health care provider about the risks of taking birth control pills if you are over 35 years old, smoke, get migraines, or have ever had a blood clot.  Get evaluated for sleep  disorders (sleep apnea).  Talk to your health care provider about getting a sleep evaluation if you snore a lot or have excessive sleepiness.  Take medicines only as directed by your health care provider.  For some people, aspirin or blood thinners (anticoagulants) are helpful in reducing the risk of forming abnormal blood clots that can lead to stroke. If you have the irregular heart rhythm of atrial fibrillation, you should be on a blood thinner unless there is a good reason you cannot take them.  Understand all your medicine instructions.  Make sure that other conditions (such as anemia or atherosclerosis) are addressed. SEEK IMMEDIATE MEDICAL CARE IF:   You have sudden weakness or numbness of the face, arm, or leg, especially on one side of the body.  Your face or eyelid droops to one side.  You have sudden confusion.  You have trouble speaking (aphasia) or understanding.  You have sudden trouble seeing in one or both eyes.  You have sudden trouble walking.  You have dizziness.  You have a loss of balance or coordination.  You have a sudden, severe headache with no known cause.  You have new chest pain or an irregular heartbeat. Any of these symptoms may represent a serious problem that is an emergency. Do not wait to see if the symptoms will go away. Get medical help at once. Call your local emergency services (911 in [place] Do not drive yourself to the hospital. Document Released: 04/19/2004 Document Revised: 07/27/2013 Document Reviewed: 09/12/2012 Exit[employer] Patient Information 2015 [employer]. This information is not intended to replace advice given   to you by your health care provider. Make sure you discuss any questions you have with your health care provider.   Venous Stasis or Chronic Venous Insufficiency Chronic venous insufficiency, also called venous stasis, is a condition that affects the veins in the legs. The condition prevents blood from being pumped  through these veins effectively. Blood may no longer be pumped effectively from the legs back to the heart. This condition can range from mild to severe. With proper treatment, you should be able to continue with an active life. CAUSES  Chronic venous insufficiency occurs when the vein walls become stretched, weakened, or damaged or when valves within the vein are damaged. Some common causes of this include:  High blood pressure inside the veins (venous hypertension).  Increased blood pressure in the leg veins from long periods of sitting or standing.  A blood clot that blocks blood flow in a vein (deep vein thrombosis).  Inflammation of a superficial vein (phlebitis) that causes a blood clot to form. RISK FACTORS Various things can make you more likely to develop chronic venous insufficiency, including:  Family history of this condition.  Obesity.  Pregnancy.  Sedentary lifestyle.  Smoking.  Jobs requiring long periods of standing or sitting in one place.  Being a certain age. Women in their 40s and 50s and men in their 70s are more likely to develop this condition. SIGNS AND SYMPTOMS  Symptoms may include:   Varicose veins.  Skin breakdown or ulcers.  Reddened or discolored skin on the leg.  Brown, smooth, tight, and painful skin just above the ankle, usually on the inside surface (lipodermatosclerosis).  Swelling. DIAGNOSIS  To diagnose this condition, your health care provider will take a medical history and do a physical exam. The following tests may be ordered to confirm the diagnosis:  Duplex ultrasound--A procedure that produces a picture of a blood vessel and nearby organs and also provides information on blood flow through the blood vessel.  Plethysmography--A procedure that tests blood flow.  A venogram, or venography--A procedure used to look at the veins using X-ray and dye. TREATMENT The goals of treatment are to help you return to an active life and to  minimize pain or disability. Treatment will depend on the severity of the condition. Medical procedures may be needed for severe cases. Treatment options may include:   Use of compression stockings. These can help with symptoms and lower the chances of the problem getting worse, but they do not cure the problem.  Sclerotherapy--A procedure involving an injection of a material that "dissolves" the damaged veins. Other veins in the network of blood vessels take over the function of the damaged veins.  Surgery to remove the vein or cut off blood flow through the vein (vein stripping or laser ablation surgery).  Surgery to repair a valve. HOME CARE INSTRUCTIONS   Wear compression stockings as directed by your health care provider.  Only take over-the-counter or prescription medicines for pain, discomfort, or fever as directed by your health care provider.  Follow up with your health care provider as directed. SEEK MEDICAL CARE IF:   You have redness, swelling, or increasing pain in the affected area.  You see a red streak or line that extends up or down from the affected area.  You have a breakdown or loss of skin in the affected area, even if the breakdown is small.  You have an injury to the affected area. SEEK IMMEDIATE MEDICAL CARE IF:   You have   an injury and open wound in the affected area.  Your pain is severe and does not improve with medicine.  You have sudden numbness or weakness in the foot or ankle below the affected area, or you have trouble moving your foot or ankle.  You have a fever or persistent symptoms for more than 2-3 days.  You have a fever and your symptoms suddenly get worse. MAKE SURE YOU:   Understand these instructions.  Will watch your condition.  Will get help right away if you are not doing well or get worse. Document Released: 07/16/2006 Document Revised: 12/31/2012 Document Reviewed: 11/17/2012 ExitCare Patient Information 2015 [employer].  This information is not intended to replace advice given to you by your health care provider. Make sure you discuss any questions you have with your health care provider.  

## 2014-04-09 DIAGNOSIS — H35371 Puckering of macula, right eye: Secondary | ICD-10-CM | POA: Diagnosis not present

## 2014-04-09 DIAGNOSIS — H3582 Retinal ischemia: Secondary | ICD-10-CM | POA: Diagnosis not present

## 2014-04-09 DIAGNOSIS — E11359 Type 2 diabetes mellitus with proliferative diabetic retinopathy without macular edema: Secondary | ICD-10-CM | POA: Diagnosis not present

## 2014-04-13 ENCOUNTER — Ambulatory Visit
Admission: RE | Admit: 2014-04-13 | Discharge: 2014-04-13 | Disposition: A | Discharge status: Home / Self Care | Payer: Commercial Managed Care - HMO | Source: Ambulatory Visit | Attending: Family Medicine | Admitting: Family Medicine

## 2014-04-13 ENCOUNTER — Other Ambulatory Visit: Payer: Self-pay | Admitting: Family Medicine

## 2014-04-13 DIAGNOSIS — J189 Pneumonia, unspecified organism: Secondary | ICD-10-CM

## 2014-04-13 DIAGNOSIS — M25552 Pain in left hip: Secondary | ICD-10-CM

## 2014-04-13 DIAGNOSIS — R05 Cough: Secondary | ICD-10-CM | POA: Diagnosis not present

## 2014-04-13 DIAGNOSIS — R079 Chest pain, unspecified: Secondary | ICD-10-CM | POA: Diagnosis not present

## 2014-04-13 DIAGNOSIS — M160 Bilateral primary osteoarthritis of hip: Secondary | ICD-10-CM | POA: Diagnosis not present

## 2014-04-23 DIAGNOSIS — H211X3 Other vascular disorders of iris and ciliary body, bilateral: Secondary | ICD-10-CM | POA: Diagnosis not present

## 2014-05-19 DIAGNOSIS — H35371 Puckering of macula, right eye: Secondary | ICD-10-CM | POA: Diagnosis not present

## 2014-05-19 DIAGNOSIS — E11359 Type 2 diabetes mellitus with proliferative diabetic retinopathy without macular edema: Secondary | ICD-10-CM | POA: Diagnosis not present

## 2014-05-19 DIAGNOSIS — H211X3 Other vascular disorders of iris and ciliary body, bilateral: Secondary | ICD-10-CM | POA: Diagnosis not present

## 2014-06-07 ENCOUNTER — Ambulatory Visit (INDEPENDENT_AMBULATORY_CARE_PROVIDER_SITE_OTHER): Payer: Commercial Managed Care - HMO | Admitting: Cardiovascular Disease

## 2014-06-07 ENCOUNTER — Encounter: Payer: Self-pay | Admitting: Cardiovascular Disease

## 2014-06-07 VITALS — BP 126/62 | HR 61 | Ht 71.0 in | Wt 217.8 lb

## 2014-06-07 DIAGNOSIS — I10 Essential (primary) hypertension: Secondary | ICD-10-CM | POA: Diagnosis not present

## 2014-06-07 DIAGNOSIS — E7800 Pure hypercholesterolemia, unspecified: Secondary | ICD-10-CM

## 2014-06-07 DIAGNOSIS — I2510 Atherosclerotic heart disease of native coronary artery without angina pectoris: Secondary | ICD-10-CM | POA: Diagnosis not present

## 2014-06-07 NOTE — Patient Instructions (Signed)

## 2014-06-07 NOTE — Progress Notes (Signed)
History of Present Illness: 79 yo male with history of DM, HTN, HLD, CAD s/p 4V CABG in 2007, carotid artery disease here today for follow up. He has been followed by Dr. Lia Foyer. He underwent 4V CABG 2007 (LIMA to LAD, SVG to OM, SVG sequential to PLA/PDA). Last cath February 2010 with 4 patent grafts, severe native vessel disease. He is known to have mild pulmonary artery HTN. Carotid artery disease followed in [facility] by Dr. Kellie Simmering. Last carotid doppler 04/06/14 with 60-79% RICA stenosis, less than [id number] LICA stenosis.   He is here today for follow up. He has no chest pain or SOB. Energy level is good. He is working out 4-5 times per week on the treadmill and bike. No PND, orthopnea, LE edema.   Primary Care Physician: Shirline Frees  Last Lipid Profile: Followed in primary care.   Past Medical History  Diagnosis Date  . DIABETES MELLITUS, TYPE II   . HYPERCHOLESTEROLEMIA   . UNSPECIFIED THROMBOCYTOPENIA   . HYPERTENSION   . CAD, ARTERY BYPASS GRAFT   . PULMONARY HYPERTENSION   . CAROTID ARTERY STENOSIS   . RENAL INSUFFICIENCY, CHRONIC   . ANEMIA, HX OF   . Myocardial infarction June 2007  . Cataract   . Diverticulosis   . Diabetic retinal damage of right eye 2015  . Bronchial pneumonia Jan. 2016    Past Surgical History  Procedure Laterality Date  . Coronary artery bypass graft       quad bypass/2007  . Tonsillectomy    . Colonoscopy    . Polypectomy    . Cataract extraction  2012    right eye  . Eye surgery      Current Outpatient Prescriptions  Medication Sig Dispense Refill  . ACTOS 45 MG tablet 1 tab daily    . aspirin 81 MG tablet Take 81 mg by mouth daily.      . calcium-vitamin D (OSCAL WITH D) 500-200 MG-UNIT per tablet Take 1 tablet by mouth. Calcium 600 mg    . cholecalciferol (VITAMIN D) 400 UNITS TABS Take by mouth. VITAMIN D #3     . Coenzyme Q10 400 MG CAPS Take 300 mg by mouth.    . fish oil-omega-3 fatty acids 1000 MG capsule Take 1 g by mouth daily.       [name] glipiZIDE-metformin (METAGLIP) 5-500 MG per tablet 2 tabs twice a day    . losartan (COZAAR) 50 MG tablet Take 1 tablet (50 mg total) by mouth daily. 90 tablet 3  . metoprolol succinate (TOPROL-XL) 25 MG 24 hr tablet 1 tab daily    . Multiple Vitamin (MULTI-DAY VITAMINS PO) Take by mouth. 1 tab daily     . omeprazole (PRILOSEC) 20 MG capsule 1 tab twice a day    . simvastatin (ZOCOR) 20 MG tablet 1 tab daily    . vitamin C (ASCORBIC ACID) 500 MG tablet Take 500 mg by mouth daily.       No current facility-administered medications for this visit.    No Known Allergies  History   Social History  . Marital Status: Married    Spouse Name: [name]  . Number of Children: 2  . Years of Education: [name]   Occupational History  . Retired-sales    Social History Main Topics  . Smoking status: Former Smoker    Types: Cigarettes    Quit date: 03/26/1970  . Smokeless tobacco: Never Used  . Alcohol Use: 0.6 oz/week  1 Glasses of wine per week  . Drug Use: No  . Sexual Activity: Not on file   Other Topics Concern  . Not on file   Social History Narrative    Family History  Problem Relation Age of Onset  . Diverticulitis    . Heart attack    . Heart disease Father 72    Heart disease before age 57  . Heart attack Father   . Hypertension Father   . Hyperlipidemia Father   . Varicose Veins Father   . Stroke Father     ? not sure  . Colon cancer Maternal Aunt   . Colon cancer Maternal Uncle     Review of Systems:  As stated in the HPI and otherwise negative.   BP 126/62 mmHg  Pulse 61  Ht 5\' 11"  (1.803 m)  Wt 217 lb 12.8 oz (98.793 kg)  BMI 30.39 kg/m2  Physical Examination: General: Well developed, well nourished, NAD HEENT: OP clear, mucus membranes moist SKIN: warm, dry. No rashes. Neuro: No focal deficits Musculoskeletal: Muscle strength 5/5 all ext Psychiatric: Mood and affect normal Neck: No JVD, no carotid bruits, no thyromegaly, no  lymphadenopathy. Lungs:Clear bilaterally, no wheezes, rhonci, crackles Cardiovascular: Regular rate and rhythm. No murmurs, gallops or rubs. Abdomen:Soft. Bowel sounds present. Non-tender.  Extremities: No lower extremity edema. Pulses are 2 + in the bilateral DP/PT.  Cardiac cath 04/28/08:  1. The left main is free of critical disease.  2. The LAD has 70% narrowing and is calcified. It leads into a  smaller diagonal with about 60% mid narrowing. The mid LAD is  totally occluded. There is probably 50% involvement of the large  septal perforator.  3. The internal mammary to the LAD is widely patent. However, the LAD  distally itself is a very small-caliber vessel, nearly pencil thin  and suggestive of diffuse diabetic disease.  4. The circumflex has 80% narrowing, is diffusely diseased and totally  occluded.  5. The saphenous vein graft has at least 2 valves, but appears to be  widely patent inserting into the obtuse marginal system. The OM  system was without a critical focal narrowing, but does have  somewhat diabetic appearance.  6. The right coronary artery is moderately diseased throughout. There  is 30% narrowing proximally and 90% mid narrowing. There is  diffuse luminal irregularity throughout. There is competitive  filling of the PDA and diffuse disease after this leading into the  posterolateral system with at least 80% narrowing. There is some  filling of the distal vessel through the native circulation.  7. The saphenous vein graft to the PDA and PLA is intact and  retrograde fills the RCA system.  CONCLUSION:  1. Continued patency of the internal mammary to the LAD.  2. Continued patency of the saphenous vein graft to the OM.  3. Continued sequential saphenous vein graft to the PDA and PLA.  EKG: NSR, rate 61 bpm. Low voltage. T wave flattening diffusely.   Assessment and Plan:   1. Coronary artery disease: Stable. Continue ASA, statin, beta blocker, Ace-inh. Will  arrange echo to assess LV function and exclude valve disease.   2. Hyperlipidemia:  He is on a statin. Lipids followed in primary care.   3. Carotid artery disease: Followed in [facility] and stable by dopplers January 2016.   4. HTN: BP controlled. No changes today.

## 2014-06-17 ENCOUNTER — Ambulatory Visit (HOSPITAL_COMMUNITY): Payer: Commercial Managed Care - HMO | Attending: Cardiovascular Disease | Admitting: Radiology

## 2014-06-17 DIAGNOSIS — I2510 Atherosclerotic heart disease of native coronary artery without angina pectoris: Secondary | ICD-10-CM | POA: Insufficient documentation

## 2014-06-17 DIAGNOSIS — E785 Hyperlipidemia, unspecified: Secondary | ICD-10-CM | POA: Diagnosis not present

## 2014-06-17 DIAGNOSIS — I10 Essential (primary) hypertension: Secondary | ICD-10-CM | POA: Diagnosis not present

## 2014-06-17 DIAGNOSIS — E119 Type 2 diabetes mellitus without complications: Secondary | ICD-10-CM | POA: Diagnosis not present

## 2014-06-17 MED ORDER — PERFLUTREN LIPID MICROSPHERE
2.0000 mL | Freq: Once | INTRAVENOUS | Status: AC
  Administered 2014-06-17: 2 mL via INTRAVENOUS

## 2014-06-17 NOTE — Progress Notes (Signed)
Echocardiogram performed with definity

## 2014-06-21 ENCOUNTER — Ambulatory Visit (INDEPENDENT_AMBULATORY_CARE_PROVIDER_SITE_OTHER): Payer: Commercial Managed Care - HMO | Admitting: Cardiovascular Disease

## 2014-06-21 ENCOUNTER — Encounter: Payer: Self-pay | Admitting: Cardiovascular Disease

## 2014-06-21 ENCOUNTER — Encounter: Payer: Self-pay | Admitting: *Deleted

## 2014-06-21 VITALS — BP 130/60 | HR 64 | Ht 71.0 in | Wt 209.0 lb

## 2014-06-21 DIAGNOSIS — E7800 Pure hypercholesterolemia, unspecified: Secondary | ICD-10-CM

## 2014-06-21 DIAGNOSIS — I25700 Atherosclerosis of coronary artery bypass graft(s), unspecified, with unstable angina pectoris: Secondary | ICD-10-CM | POA: Diagnosis not present

## 2014-06-21 DIAGNOSIS — I420 Dilated cardiomyopathy: Secondary | ICD-10-CM

## 2014-06-21 DIAGNOSIS — I6523 Occlusion and stenosis of bilateral carotid arteries: Secondary | ICD-10-CM

## 2014-06-21 DIAGNOSIS — I10 Essential (primary) hypertension: Secondary | ICD-10-CM

## 2014-06-21 LAB — CBC WITH DIFFERENTIAL/PLATELET
Basophils Absolute: 0 10*3/uL (ref 0.0–0.1)
Basophils Relative: 0.5 % (ref 0.0–3.0)
Eosinophils Absolute: 0.2 10*3/uL (ref 0.0–0.7)
Eosinophils Relative: 2.2 % (ref 0.0–5.0)
HCT: 37.6 % — ABNORMAL LOW (ref 39.0–52.0)
Hemoglobin: 12.6 g/dL — ABNORMAL LOW (ref 13.0–17.0)
Lymphocytes Relative: 13.9 % (ref 12.0–46.0)
Lymphs Abs: 1.1 10*3/uL (ref 0.7–4.0)
MCHC: 33.5 g/dL (ref 30.0–36.0)
MCV: 90.6 fl (ref 78.0–100.0)
Monocytes Absolute: 0.6 10*3/uL (ref 0.1–1.0)
Monocytes Relative: 7.8 % (ref 3.0–12.0)
Neutro Abs: 5.7 10*3/uL (ref 1.4–7.7)
Neutrophils Relative %: 75.6 % (ref 43.0–77.0)
Platelets: 161 10*3/uL (ref 150.0–400.0)
RBC: 4.15 Mil/uL — ABNORMAL LOW (ref 4.22–5.81)
RDW: 15.8 % — ABNORMAL HIGH (ref 11.5–15.5)
WBC: 7.6 10*3/uL (ref 4.0–10.5)

## 2014-06-21 LAB — PROTIME-INR
INR: 1 ratio (ref 0.8–1.0)
Prothrombin Time: 11.6 s (ref 9.6–13.1)

## 2014-06-21 LAB — BASIC METABOLIC PANEL
BUN: 27 mg/dL — ABNORMAL HIGH (ref 6–23)
CO2: 28 mEq/L (ref 19–32)
Calcium: 9.4 mg/dL (ref 8.4–10.5)
Chloride: 104 mEq/L (ref 96–112)
Creatinine, Ser: 1.17 mg/dL (ref 0.40–1.50)
GFR: 64.29 mL/min (ref >60.00)
Glucose, Bld: 145 mg/dL — ABNORMAL HIGH (ref 70–99)
Potassium: 4.9 mEq/L (ref 3.5–5.1)
Sodium: 137 mEq/L (ref 135–145)

## 2014-06-21 NOTE — Patient Instructions (Signed)
Your physician recommends that you schedule a follow-up appointment in:  4 weeks with NP or PA  Lab work to be done today--BMP, CBC, PT  Your physician has requested that you have a cardiac catheterization. Cardiac catheterization is used to diagnose and/or treat various heart conditions. Doctors may recommend this procedure for a number of different reasons. The most common reason is to evaluate chest pain. Chest pain can be a symptom of coronary artery disease (CAD), and cardiac catheterization can show whether plaque is narrowing or blocking your heart's arteries. This procedure is also used to evaluate the valves, as well as measure the blood flow and oxygen levels in different parts of your heart. For further information please visit [email or web address]. Please follow instruction sheet, as given. Scheduled for June 24, 2014

## 2014-06-21 NOTE — Progress Notes (Signed)
History of Present Illness: 79 yo male with history of DM, HTN, HLD, CAD s/p 4V CABG in 2007, carotid artery disease here today for follow up. He has been followed by Dr. Lia Foyer. He underwent 4V CABG 2007 (LIMA to LAD, SVG to OM, SVG sequential to PLA/PDA). Last cath February 2010 with 4 patent grafts, severe native vessel disease. He is known to have mild pulmonary artery HTN. Carotid artery disease followed in [facility] by Dr. Kellie Simmering. Last carotid doppler 04/06/14 with 60-79% RICA stenosis, less than [id number] LICA stenosis. I saw him 06/07/14 and he was doing well. I arranged an echo on 06/17/14 which showed LVEF=25-30%, akinesis of the inferoseptum and apex. No significant valve disease.   He is here today for follow up. He describes dizziness and SOB. No chest pain. No PND, orthopnea, LE edema.   Primary Care Physician: Shirline Frees  Last Lipid Profile: Followed in primary care.   Past Medical History  Diagnosis Date  . DIABETES MELLITUS, TYPE II   . HYPERCHOLESTEROLEMIA   . UNSPECIFIED THROMBOCYTOPENIA   . HYPERTENSION   . CAD, ARTERY BYPASS GRAFT   . PULMONARY HYPERTENSION   . CAROTID ARTERY STENOSIS   . RENAL INSUFFICIENCY, CHRONIC   . ANEMIA, HX OF   . Myocardial infarction June 2007  . Cataract   . Diverticulosis   . Diabetic retinal damage of right eye 2015  . Bronchial pneumonia Jan. 2016    Past Surgical History  Procedure Laterality Date  . Coronary artery bypass graft       quad bypass/2007  . Tonsillectomy    . Colonoscopy    . Polypectomy    . Cataract extraction  2012    right eye  . Eye surgery      Current Outpatient Prescriptions  Medication Sig Dispense Refill  . ACTOS 45 MG tablet 1 tab daily    . aspirin 81 MG tablet Take 81 mg by mouth daily.      . calcium-vitamin D (OSCAL WITH D) 500-200 MG-UNIT per tablet Take 1 tablet by mouth. Calcium 600 mg    . cholecalciferol (VITAMIN D) 400 UNITS TABS Take by mouth. VITAMIN D #3     . Coenzyme Q10 400 MG CAPS  Take 300 mg by mouth.    . fish oil-omega-3 fatty acids 1000 MG capsule Take 1 g by mouth daily.      [name] glipiZIDE-metformin (METAGLIP) 5-500 MG per tablet 2 tabs twice a day    . losartan (COZAAR) 50 MG tablet Take 1 tablet (50 mg total) by mouth daily. 90 tablet 3  . metoprolol succinate (TOPROL-XL) 25 MG 24 hr tablet 1 tab daily    . Multiple Vitamin (MULTI-DAY VITAMINS PO) Take by mouth. 1 tab daily     . omeprazole (PRILOSEC) 20 MG capsule 1 tab twice a day    . simvastatin (ZOCOR) 20 MG tablet 1 tab daily    . TURMERIC PO Take 400 tablets by mouth 3 (three) times daily after meals.    . vitamin C (ASCORBIC ACID) 500 MG tablet Take 500 mg by mouth daily.      . vitamin E 1000 UNIT capsule Take 1,000 Units by mouth every other day.     No current facility-administered medications for this visit.    No Known Allergies  History   Social History  . Marital Status: Married    Spouse Name: [name]  . Number of Children: 2  . Years of Education: [name]  Occupational History  . Retired-sales    Social History Main Topics  . Smoking status: Former Smoker    Types: Cigarettes    Quit date: 03/26/1970  . Smokeless tobacco: Never Used  . Alcohol Use: 0.6 oz/week    1 Glasses of wine per week  . Drug Use: No  . Sexual Activity: Not on file   Other Topics Concern  . Not on file   Social History Narrative    Family History  Problem Relation Age of Onset  . Diverticulitis    . Heart attack    . Heart disease Father 50    Heart disease before age 21  . Heart attack Father   . Hypertension Father   . Hyperlipidemia Father   . Varicose Veins Father   . Stroke Father     ? not sure  . Colon cancer Maternal Aunt   . Colon cancer Maternal Uncle     Review of Systems:  As stated in the HPI and otherwise negative.   BP 130/60 mmHg  Pulse 64  Ht 5\' 11"  (1.803 m)  Wt 209 lb (94.802 kg)  BMI 29.16 kg/m2  SpO2 97%  Physical Examination: General: Well developed, well nourished,  NAD HEENT: OP clear, mucus membranes moist SKIN: warm, dry. No rashes. Neuro: No focal deficits Musculoskeletal: Muscle strength 5/5 all ext Psychiatric: Mood and affect normal Neck: No JVD, no carotid bruits, no thyromegaly, no lymphadenopathy. Lungs:Clear bilaterally, no wheezes, rhonci, crackles Cardiovascular: Regular rate and rhythm. No murmurs, gallops or rubs. Abdomen:Soft. Bowel sounds present. Non-tender.  Extremities: No lower extremity edema. Pulses are 2 + in the bilateral DP/PT.  Cardiac cath 04/28/08:  1. The left main is free of critical disease.  2. The LAD has 70% narrowing and is calcified. It leads into a  smaller diagonal with about 60% mid narrowing. The mid LAD is  totally occluded. There is probably 50% involvement of the large  septal perforator.  3. The internal mammary to the LAD is widely patent. However, the LAD  distally itself is a very small-caliber vessel, nearly pencil thin  and suggestive of diffuse diabetic disease.  4. The circumflex has 80% narrowing, is diffusely diseased and totally  occluded.  5. The saphenous vein graft has at least 2 valves, but appears to be  widely patent inserting into the obtuse marginal system. The OM  system was without a critical focal narrowing, but does have  somewhat diabetic appearance.  6. The right coronary artery is moderately diseased throughout. There  is 30% narrowing proximally and 90% mid narrowing. There is  diffuse luminal irregularity throughout. There is competitive  filling of the PDA and diffuse disease after this leading into the  posterolateral system with at least 80% narrowing. There is some  filling of the distal vessel through the native circulation.  7. The saphenous vein graft to the PDA and PLA is intact and  retrograde fills the RCA system.  CONCLUSION:  1. Continued patency of the internal mammary to the LAD.  2. Continued patency of the saphenous vein graft to the OM.  3. Continued  sequential saphenous vein graft to the PDA and  Echo 06/17/14: Left ventricle: The cavity size was normal. Systolic function was severely reduced. The estimated ejection fraction was in the range of 25% to 30%. Diffuse hypokinesis. There is hypokinesis of the inferoseptal and apical myocardium. Features are consistent with a pseudonormal left ventricular filling pattern, with concomitant abnormal relaxation and increased filling  pressure (grade 2 diastolic dysfunction). - Mitral valve: Moderately calcified annulus. - Left atrium: The atrium was mildly dilated. - Right ventricle: The cavity size was mildly dilated. Wall thickness was normal. - Right atrium: The atrium was mildly dilated. - Pulmonary arteries: Systolic pressure was moderately increased. PA peak pressure: 51 mm Hg (S). Impressions: - EF is reduced when compared to prior echocardiogram.   Assessment and Plan:   1. Coronary artery disease: Stable without chest pains but LVEF is reduced on recent echo. Will plan cardiac cath on 06/24/14 with possible PCI. Risks and benefits reviewed today. He agrees to proceed.  Continue ASA, statin, beta blocker, Ace-inh.   2. Hyperlipidemia:  He is on a statin. Lipids followed in primary care.   3. Carotid artery disease: Followed in [facility] and stable by dopplers January 2016.   4. HTN: BP controlled. No changes today.   5. Cardiomyopathy: LVEF is reduced as above. Prior to his bypass in 2007 his LVEF was around 30% but improved to 60% post bypass. Now LVEF is 30%. Plan cath at above.

## 2014-06-24 ENCOUNTER — Encounter (HOSPITAL_COMMUNITY)
Admission: RE | Disposition: A | Discharge status: Home / Self Care | Payer: Self-pay | Source: Ambulatory Visit | Attending: Cardiovascular Disease

## 2014-06-24 ENCOUNTER — Encounter (HOSPITAL_COMMUNITY): Payer: Self-pay | Admitting: General Practice

## 2014-06-24 ENCOUNTER — Ambulatory Visit (HOSPITAL_COMMUNITY)
Admission: RE | Admit: 2014-06-24 | Discharge: 2014-06-25 | Disposition: A | Discharge status: Home / Self Care | Payer: Commercial Managed Care - HMO | Source: Ambulatory Visit | Attending: Cardiovascular Disease | Admitting: Cardiovascular Disease

## 2014-06-24 DIAGNOSIS — I25810 Atherosclerosis of coronary artery bypass graft(s) without angina pectoris: Secondary | ICD-10-CM | POA: Diagnosis present

## 2014-06-24 DIAGNOSIS — N183 Chronic kidney disease, stage 3 unspecified: Secondary | ICD-10-CM | POA: Diagnosis present

## 2014-06-24 DIAGNOSIS — Z79899 Other long term (current) drug therapy: Secondary | ICD-10-CM | POA: Insufficient documentation

## 2014-06-24 DIAGNOSIS — Z951 Presence of aortocoronary bypass graft: Secondary | ICD-10-CM | POA: Diagnosis not present

## 2014-06-24 DIAGNOSIS — I252 Old myocardial infarction: Secondary | ICD-10-CM | POA: Insufficient documentation

## 2014-06-24 DIAGNOSIS — I4891 Unspecified atrial fibrillation: Secondary | ICD-10-CM | POA: Diagnosis not present

## 2014-06-24 DIAGNOSIS — E1122 Type 2 diabetes mellitus with diabetic chronic kidney disease: Secondary | ICD-10-CM | POA: Insufficient documentation

## 2014-06-24 DIAGNOSIS — E1129 Type 2 diabetes mellitus with other diabetic kidney complication: Secondary | ICD-10-CM | POA: Diagnosis present

## 2014-06-24 DIAGNOSIS — I272 Other secondary pulmonary hypertension: Secondary | ICD-10-CM | POA: Diagnosis not present

## 2014-06-24 DIAGNOSIS — I255 Ischemic cardiomyopathy: Secondary | ICD-10-CM | POA: Diagnosis not present

## 2014-06-24 DIAGNOSIS — I10 Essential (primary) hypertension: Secondary | ICD-10-CM | POA: Diagnosis present

## 2014-06-24 DIAGNOSIS — E780 Pure hypercholesterolemia: Secondary | ICD-10-CM | POA: Insufficient documentation

## 2014-06-24 DIAGNOSIS — I739 Peripheral vascular disease, unspecified: Secondary | ICD-10-CM | POA: Diagnosis present

## 2014-06-24 DIAGNOSIS — E785 Hyperlipidemia, unspecified: Secondary | ICD-10-CM | POA: Diagnosis present

## 2014-06-24 DIAGNOSIS — I2582 Chronic total occlusion of coronary artery: Secondary | ICD-10-CM | POA: Diagnosis not present

## 2014-06-24 DIAGNOSIS — K5790 Diverticulosis of intestine, part unspecified, without perforation or abscess without bleeding: Secondary | ICD-10-CM | POA: Diagnosis not present

## 2014-06-24 DIAGNOSIS — Z87891 Personal history of nicotine dependence: Secondary | ICD-10-CM | POA: Insufficient documentation

## 2014-06-24 DIAGNOSIS — I129 Hypertensive chronic kidney disease with stage 1 through stage 4 chronic kidney disease, or unspecified chronic kidney disease: Secondary | ICD-10-CM | POA: Insufficient documentation

## 2014-06-24 DIAGNOSIS — I2510 Atherosclerotic heart disease of native coronary artery without angina pectoris: Secondary | ICD-10-CM | POA: Diagnosis not present

## 2014-06-24 DIAGNOSIS — I6523 Occlusion and stenosis of bilateral carotid arteries: Secondary | ICD-10-CM | POA: Diagnosis not present

## 2014-06-24 DIAGNOSIS — N184 Chronic kidney disease, stage 4 (severe): Secondary | ICD-10-CM | POA: Diagnosis present

## 2014-06-24 DIAGNOSIS — I429 Cardiomyopathy, unspecified: Secondary | ICD-10-CM

## 2014-06-24 DIAGNOSIS — E1139 Type 2 diabetes mellitus with other diabetic ophthalmic complication: Secondary | ICD-10-CM | POA: Diagnosis not present

## 2014-06-24 DIAGNOSIS — I25700 Atherosclerosis of coronary artery bypass graft(s), unspecified, with unstable angina pectoris: Secondary | ICD-10-CM | POA: Insufficient documentation

## 2014-06-24 HISTORY — DX: Unspecified atrial fibrillation: I48.91

## 2014-06-24 HISTORY — PX: CARDIAC CATHETERIZATION: SHX172

## 2014-06-24 HISTORY — PX: LEFT HEART CATHETERIZATION WITH CORONARY/GRAFT ANGIOGRAM: SHX5450

## 2014-06-24 LAB — GLUCOSE, CAPILLARY
Glucose-Capillary: 110 mg/dL — ABNORMAL HIGH (ref 70–99)
Glucose-Capillary: 106 mg/dL — ABNORMAL HIGH (ref 70–99)
Glucose-Capillary: 146 mg/dL — ABNORMAL HIGH (ref 70–99)

## 2014-06-24 SURGERY — LEFT HEART CATHETERIZATION WITH CORONARY/GRAFT ANGIOGRAM
Anesthesia: LOCAL

## 2014-06-24 MED ORDER — ASPIRIN 81 MG PO CHEW
CHEWABLE_TABLET | ORAL | Status: AC
  Filled 2014-06-24: qty 1

## 2014-06-24 MED ORDER — SODIUM CHLORIDE 0.9 % IJ SOLN: 3.0000 mL | INTRAMUSCULAR | Status: DC | PRN

## 2014-06-24 MED ORDER — ACETAMINOPHEN 325 MG PO TABS: 650.0000 mg | ORAL_TABLET | ORAL | Status: DC | PRN

## 2014-06-24 MED ORDER — SODIUM CHLORIDE 0.9 % IV SOLN: 250.0000 mL | INTRAVENOUS | Status: DC | PRN

## 2014-06-24 MED ORDER — METOPROLOL SUCCINATE ER 50 MG PO TB24
50.0000 mg | ORAL_TABLET | Freq: Every day | ORAL | Status: DC
  Administered 2014-06-24 – 2014-06-25 (×2): 50 mg via ORAL
  Filled 2014-06-24 (×2): qty 1

## 2014-06-24 MED ORDER — PANTOPRAZOLE SODIUM 40 MG PO TBEC
40.0000 mg | DELAYED_RELEASE_TABLET | Freq: Every day | ORAL | Status: DC
  Administered 2014-06-25: 40 mg via ORAL
  Filled 2014-06-24: qty 1

## 2014-06-24 MED ORDER — FENTANYL CITRATE 0.05 MG/ML IJ SOLN
INTRAMUSCULAR | Status: AC
  Filled 2014-06-24: qty 2

## 2014-06-24 MED ORDER — SODIUM CHLORIDE 0.9 % IJ SOLN: 3.0000 mL | Freq: Two times a day (BID) | INTRAMUSCULAR | Status: DC

## 2014-06-24 MED ORDER — OXYCODONE-ACETAMINOPHEN 5-325 MG PO TABS: 1.0000 | ORAL_TABLET | ORAL | Status: DC | PRN

## 2014-06-24 MED ORDER — CO Q 10 100 MG PO CAPS: 300.0000 mg | ORAL_CAPSULE | Freq: Every day | ORAL | Status: DC

## 2014-06-24 MED ORDER — ASPIRIN 81 MG PO CHEW
81.0000 mg | CHEWABLE_TABLET | Freq: Every day | ORAL | Status: DC
  Administered 2014-06-25: 81 mg via ORAL
  Filled 2014-06-24: qty 1

## 2014-06-24 MED ORDER — GLIPIZIDE 10 MG PO TABS
10.0000 mg | ORAL_TABLET | Freq: Two times a day (BID) | ORAL | Status: DC
  Administered 2014-06-24 – 2014-06-25 (×2): 10 mg via ORAL
  Filled 2014-06-24 (×4): qty 1

## 2014-06-24 MED ORDER — MORPHINE SULFATE 2 MG/ML IJ SOLN: 2.0000 mg | INTRAMUSCULAR | Status: DC | PRN

## 2014-06-24 MED ORDER — NITROGLYCERIN 1 MG/10 ML FOR IR/CATH LAB
INTRA_ARTERIAL | Status: AC
  Filled 2014-06-24: qty 10

## 2014-06-24 MED ORDER — LIDOCAINE HCL (PF) 1 % IJ SOLN
INTRAMUSCULAR | Status: AC
  Filled 2014-06-24: qty 30

## 2014-06-24 MED ORDER — ONDANSETRON HCL 4 MG/2ML IJ SOLN: 4.0000 mg | Freq: Four times a day (QID) | INTRAMUSCULAR | Status: DC | PRN

## 2014-06-24 MED ORDER — SODIUM CHLORIDE 0.9 % IV SOLN
INTRAVENOUS | Status: DC
  Administered 2014-06-24: 1000 mL via INTRAVENOUS

## 2014-06-24 MED ORDER — PIOGLITAZONE HCL 45 MG PO TABS
45.0000 mg | ORAL_TABLET | Freq: Every day | ORAL | Status: DC
  Administered 2014-06-24 – 2014-06-25 (×2): 45 mg via ORAL
  Filled 2014-06-24 (×2): qty 1

## 2014-06-24 MED ORDER — ASPIRIN 81 MG PO CHEW: 81.0000 mg | CHEWABLE_TABLET | ORAL | Status: DC

## 2014-06-24 MED ORDER — SIMVASTATIN 20 MG PO TABS
20.0000 mg | ORAL_TABLET | Freq: Every day | ORAL | Status: DC
  Filled 2014-06-24: qty 1

## 2014-06-24 MED ORDER — LOSARTAN POTASSIUM 50 MG PO TABS
50.0000 mg | ORAL_TABLET | Freq: Every day | ORAL | Status: DC
  Administered 2014-06-25: 50 mg via ORAL
  Filled 2014-06-24: qty 1

## 2014-06-24 MED ORDER — HEPARIN (PORCINE) IN NACL 100-0.45 UNIT/ML-% IJ SOLN
1300.0000 [IU]/h | INTRAMUSCULAR | Status: AC
  Administered 2014-06-24: 1300 [IU]/h via INTRAVENOUS
  Filled 2014-06-24 (×2): qty 250

## 2014-06-24 MED ORDER — SODIUM CHLORIDE 0.9 % IV SOLN: INTRAVENOUS | Status: AC

## 2014-06-24 MED ORDER — MIDAZOLAM HCL 2 MG/2ML IJ SOLN
INTRAMUSCULAR | Status: AC
  Filled 2014-06-24: qty 2

## 2014-06-24 MED ORDER — HEPARIN (PORCINE) IN NACL 2-0.9 UNIT/ML-% IJ SOLN
INTRAMUSCULAR | Status: AC
  Filled 2014-06-24: qty 1000

## 2014-06-24 NOTE — Progress Notes (Signed)
PHARMACIST - PHYSICIAN ORDER COMMUNICATION  CONCERNING: P&T Medication Policy on Herbal Medications  DESCRIPTION:  This patient's order for:  Co-Q-10  has been noted.  This product(s) is classified as an "herbal" or natural product. Due to a lack of definitive safety studies or FDA approval, nonstandard manufacturing practices, plus the potential risk of unknown drug-drug interactions while on inpatient medications, the Pharmacy and Therapeutics Committee does not permit the use of "herbal" or natural products of this type within [facility].   ACTION TAKEN: The pharmacy department is unable to verify this order at this time and your patient has been informed of this safety policy. Please reevaluate patient's clinical condition at discharge and address if the herbal or natural product(s) should be resumed at that time.  [place], Pharm.D., BCPS Clinical Pharmacist Pager: [phone] 06/24/2014 1:02 PM

## 2014-06-24 NOTE — Progress Notes (Signed)
Pt with new diagnosis of atrial fibrillation today when he arrived in the cath lab. This could explain his dyspnea and dizziness and if his HR has been elevated could also explain his drop in LVEF. His atrial fibrillation is not rate controlled today.    Will admit to observation in telemetry and follow on cardiac monitor today. Will increase Toprol to 50 mg daily for better rate control and adjust as needed over the next 24 hours. I have had a long discussion with the patient and his wife regarding long term anti-coagulation. CHADS-VASC score is 7 indicating 11.2% chance per year of CVA. Will start heparin IV 8 hours post sheath pull. If his groin is stable in the am and renal function is stable post cath, will start Eliquis 5 mg po BID tomorrow. My hope is that his LVEF will improve with rate control. If stable in am, could be discharged home and have close f/u with me or office APP in next 7-10 days. If he remains in atrial fibrillation, will consider DCCV after one month of anti-coagulation.   Kara Melching 06/24/2014 12:16 PM

## 2014-06-24 NOTE — Interval H&P Note (Signed)
History and Physical Interval Note:  06/24/2014 10:30 AM  [name].  has presented today for cardiac cath with the diagnosis of CAD s/p CABG, cardiomyopathy.  The various methods of treatment have been discussed with the patient and family. After consideration of risks, benefits and other options for treatment, the patient has consented to  Procedure(s): LEFT HEART CATHETERIZATION WITH CORONARY/GRAFT ANGIOGRAM ([name]) as a surgical intervention .  The patient's history has been reviewed, patient examined, no change in status, stable for surgery.  I have reviewed the patient's chart and labs.  Questions were answered to the patient's satisfaction.    Cath Lab Visit (complete for each Cath Lab visit)  Clinical Evaluation Leading to the Procedure:   ACS: No.  Non-ACS:    Anginal Classification: CCS II  Anti-ischemic medical therapy: Minimal Therapy (1 class of medications)  Non-Invasive Test Results: No non-invasive testing performed  Prior CABG: Previous CABG         [name]

## 2014-06-24 NOTE — CV Procedure (Signed)
Cardiac Catheterization Operative Report  [name] [id number] 3/31/201611:30 AM [name] Frees, MD  Procedure Performed:  1. Left Heart Catheterization 2. Selective Coronary Angiography 3. SVG angiography 4. LIMA graft angiography  Operator: [name], MD  Indication: 79 yo male with history of DM, HTN, HLD, CAD s/p 4V CABG in 2007, carotid artery disease with recent echo demonstrating reduction in LV function. He underwent 4V CABG 2007 (LIMA to LAD, SVG to OM, SVG sequential to PLA/PDA). Last cath February 2010 with 4 patent grafts, severe native vessel disease. I saw him 06/07/14 and he was doing well. I arranged an echo to update his LVEF on 06/17/14 and this showed LVEF=25-30%, akinesis of the inferoseptum and apex. No significant valve disease.  He was noted to be in sinus rhythm at all visits over the last five years. Over the last week has noted SOB and dizziness. No chest pain.                                 Procedure Details: The risks, benefits, complications, treatment options, and expected outcomes were discussed with the patient. The patient and/or family concurred with the proposed plan, giving informed consent. The patient was brought to the cath lab after IV hydration was begun and oral premedication was given. The patient was further sedated with Versed and Fentanyl. The right groin was prepped and draped in the usual manner. Using the modified Seldinger access technique, a 5 French sheath was placed in the right femoral artery. Standard diagnostic catheters were used to perform selective coronary angiography. The JR4 catheter was used to engage all vein grafts, the LIMA graft angiography, the RCA. The JR4 was also used to cross the aortic valve and LV pressures were measured. No LV gram performed.   There were no immediate complications. The patient was taken to the recovery area in stable condition.   Of note, pt in atrial fibrillation upon  arrival to the cath lab. This has not been documented over the last five years (he did have post-op atrial fibrillation in 2007 following his bypass surgery).   Hemodynamic Findings: Central aortic pressure: 124/62 Left ventricular pressure: 130/3/9  Angiographic Findings:  Left main: 30% mid stenosis.   Left Anterior Descending Artery: Large caliber diffusely diseased vessel that courses to the apex. The proximal vessel has a 95% stenosis. There is a small caliber diagonal branch that is supplied by the proximal LAD.The mid LAD is occluded. The mid and distal LAD fills from the patent IMA graft. There is a moderate caliber septal perforating branch.   Circumflex Artery: Moderate caliber vessel with proximal 80% stenosis, 100% mid occlusion. The obtuse marginal branch is occluded and fills from the patent vein graft.   Right Coronary Artery: Large dominant vessel with diffuse 30% proximal stenosis, 95% mid stenosis, diffuse 30% distal stenosis. There is diffuse 90% distal stenosis just before the bifurcation. The PLA and PDA fill antegrade and from the patent sequential vein grafts.   Graft Anatomy:  SVG to OM is patent. There are two valves in the body of the vein graft and the appearance of this graft is unchanged from last cath in 2010.  SVG sequential to PLA/PDA is patent LIMA to mid LAD is patent. The target LAD vessel is small and diffusely diseased.   Left Ventricular Angiogram: Deferred.   Impression: 1. Severe triple vessel CAD s/p 4V CABG with 4/4  patent bypass grafts 2. Ischemic cardiomyopathy 3. New onset atrial fibrillation-not recognized before today (EKG on 06/07/14 showed sinus) 4. Dyspnea and dizziness likely related to atrial fibrillation  Recommendations: Will continue ASA, beta blocker and ARB. His atrial fibrillation is not rate controlled. This is a new diagnosis. Will admit to observation in telemetry and follow on cardiac monitor today. Will increase Toprol to 50  mg daily for better rate control and adjust as needed over the next 24 hours. I have had a long discussion with the patient and his wife regarding long term anti-coagulation. CHADS-VASC score is 7 indicating 11.2% chance per year of CVA. Will start heparin IV 8 hours post sheath pull. If his groin is stable in the am and renal function is stable post cath, will start Eliquis 5 mg po BID. My hope is that his LVEF will improve with rate control. If stable in am, could be discharged home and have close f/u with me or office APP in next 7-10 days. If he remains in atrial fibrillation, will consider DCCV after one month of anti-coagulation.        Complications:  None. The patient tolerated the procedure well.

## 2014-06-24 NOTE — Progress Notes (Signed)
Site area: right  Groin a 5 french arterial sheath was removed  Site Prior to Removal:  Level 0  Pressure Applied For 15 MINUTES    Minutes Beginning at 1150a  Manual:   Yes.    Patient Status During Pull:  stable  Post Pull Groin Site:  Level 0  Post Pull Instructions Given:  Yes.    Post Pull Pulses Present:  Yes.    Dressing Applied:  Yes.    Comments:  VS remain stable during sheath pull.  Pt denies any discomfort at site at this time.

## 2014-06-24 NOTE — Progress Notes (Signed)
ANTICOAGULATION CONSULT NOTE - Initial Consult  Pharmacy Consult for Heparin Indication: atrial fibrillation  No Known Allergies  Patient Measurements: Height: 5\' 11"  (180.3 cm) Weight: 195 lb (88.451 kg) IBW/kg (Calculated) : 75.3  Vital Signs: Temp: 97.9 F (36.6 C) (03/31 1225) Temp Source: Oral (03/31 1225) BP: 120/64 mmHg (03/31 1225) Pulse Rate: 76 (03/31 1225)  Labs: No results for input(s): HGB, HCT, PLT, APTT, LABPROT, INR, HEPARINUNFRC, CREATININE, CKTOTAL, CKMB, TROPONINI in the last 72 hours.  Estimated Creatinine Clearance: 57.2 mL/min (by C-G formula based on Cr of 1.17).   Medical History: Past Medical History  Diagnosis Date  . DIABETES MELLITUS, TYPE II   . HYPERCHOLESTEROLEMIA   . UNSPECIFIED THROMBOCYTOPENIA   . HYPERTENSION   . CAD, ARTERY BYPASS GRAFT   . PULMONARY HYPERTENSION   . CAROTID ARTERY STENOSIS   . RENAL INSUFFICIENCY, CHRONIC   . ANEMIA, HX OF   . Myocardial infarction June 2007  . Cataract   . Diverticulosis   . Diabetic retinal damage of right eye 2015  . Bronchial pneumonia Jan. 2016    Assessment: 79 year old male s/p cath today to start heparin 8 hours post sheath pull for Afib with plans to start Eliquis in AM if groin is stable.  Goal of Therapy:  Heparin level 0.3-0.7 units/ml Monitor platelets by anticoagulation protocol: Yes   Plan:  Heparin to start at 8 pm at 1300 units / hr Daily heparin level, CBC  Thank you. Anette Guarneri, PharmD [phone]  Tad Moore 06/24/2014,1:20 PM

## 2014-06-24 NOTE — H&P (View-Only) (Signed)
History of Present Illness: 79 yo male with history of DM, HTN, HLD, CAD s/p 4V CABG in 2007, carotid artery disease here today for follow up. He has been followed by Dr. Lia Foyer. He underwent 4V CABG 2007 (LIMA to LAD, SVG to OM, SVG sequential to PLA/PDA). Last cath February 2010 with 4 patent grafts, severe native vessel disease. He is known to have mild pulmonary artery HTN. Carotid artery disease followed in [facility] by Dr. Kellie Simmering. Last carotid doppler 04/06/14 with 60-79% RICA stenosis, less than [id number] LICA stenosis. I saw him 06/07/14 and he was doing well. I arranged an echo on 06/17/14 which showed LVEF=25-30%, akinesis of the inferoseptum and apex. No significant valve disease.   He is here today for follow up. He describes dizziness and SOB. No chest pain. No PND, orthopnea, LE edema.   Primary Care Physician: Shirline Frees  Last Lipid Profile: Followed in primary care.   Past Medical History  Diagnosis Date  . DIABETES MELLITUS, TYPE II   . HYPERCHOLESTEROLEMIA   . UNSPECIFIED THROMBOCYTOPENIA   . HYPERTENSION   . CAD, ARTERY BYPASS GRAFT   . PULMONARY HYPERTENSION   . CAROTID ARTERY STENOSIS   . RENAL INSUFFICIENCY, CHRONIC   . ANEMIA, HX OF   . Myocardial infarction June 2007  . Cataract   . Diverticulosis   . Diabetic retinal damage of right eye 2015  . Bronchial pneumonia Jan. 2016    Past Surgical History  Procedure Laterality Date  . Coronary artery bypass graft       quad bypass/2007  . Tonsillectomy    . Colonoscopy    . Polypectomy    . Cataract extraction  2012    right eye  . Eye surgery      Current Outpatient Prescriptions  Medication Sig Dispense Refill  . ACTOS 45 MG tablet 1 tab daily    . aspirin 81 MG tablet Take 81 mg by mouth daily.      . calcium-vitamin D (OSCAL WITH D) 500-200 MG-UNIT per tablet Take 1 tablet by mouth. Calcium 600 mg    . cholecalciferol (VITAMIN D) 400 UNITS TABS Take by mouth. VITAMIN D #3     . Coenzyme Q10 400 MG CAPS  Take 300 mg by mouth.    . fish oil-omega-3 fatty acids 1000 MG capsule Take 1 g by mouth daily.      [name] glipiZIDE-metformin (METAGLIP) 5-500 MG per tablet 2 tabs twice a day    . losartan (COZAAR) 50 MG tablet Take 1 tablet (50 mg total) by mouth daily. 90 tablet 3  . metoprolol succinate (TOPROL-XL) 25 MG 24 hr tablet 1 tab daily    . Multiple Vitamin (MULTI-DAY VITAMINS PO) Take by mouth. 1 tab daily     . omeprazole (PRILOSEC) 20 MG capsule 1 tab twice a day    . simvastatin (ZOCOR) 20 MG tablet 1 tab daily    . TURMERIC PO Take 400 tablets by mouth 3 (three) times daily after meals.    . vitamin C (ASCORBIC ACID) 500 MG tablet Take 500 mg by mouth daily.      . vitamin E 1000 UNIT capsule Take 1,000 Units by mouth every other day.     No current facility-administered medications for this visit.    No Known Allergies  History   Social History  . Marital Status: Married    Spouse Name: [name]  . Number of Children: 2  . Years of Education: [name]  Occupational History  . Retired-sales    Social History Main Topics  . Smoking status: Former Smoker    Types: Cigarettes    Quit date: 03/26/1970  . Smokeless tobacco: Never Used  . Alcohol Use: 0.6 oz/week    1 Glasses of wine per week  . Drug Use: No  . Sexual Activity: Not on file   Other Topics Concern  . Not on file   Social History Narrative    Family History  Problem Relation Age of Onset  . Diverticulitis    . Heart attack    . Heart disease Father 15    Heart disease before age 12  . Heart attack Father   . Hypertension Father   . Hyperlipidemia Father   . Varicose Veins Father   . Stroke Father     ? not sure  . Colon cancer Maternal Aunt   . Colon cancer Maternal Uncle     Review of Systems:  As stated in the HPI and otherwise negative.   BP 130/60 mmHg  Pulse 64  Ht 5\' 11"  (1.803 m)  Wt 209 lb (94.802 kg)  BMI 29.16 kg/m2  SpO2 97%  Physical Examination: General: Well developed, well nourished,  NAD HEENT: OP clear, mucus membranes moist SKIN: warm, dry. No rashes. Neuro: No focal deficits Musculoskeletal: Muscle strength 5/5 all ext Psychiatric: Mood and affect normal Neck: No JVD, no carotid bruits, no thyromegaly, no lymphadenopathy. Lungs:Clear bilaterally, no wheezes, rhonci, crackles Cardiovascular: Regular rate and rhythm. No murmurs, gallops or rubs. Abdomen:Soft. Bowel sounds present. Non-tender.  Extremities: No lower extremity edema. Pulses are 2 + in the bilateral DP/PT.  Cardiac cath 04/28/08:  1. The left main is free of critical disease.  2. The LAD has 70% narrowing and is calcified. It leads into a  smaller diagonal with about 60% mid narrowing. The mid LAD is  totally occluded. There is probably 50% involvement of the large  septal perforator.  3. The internal mammary to the LAD is widely patent. However, the LAD  distally itself is a very small-caliber vessel, nearly pencil thin  and suggestive of diffuse diabetic disease.  4. The circumflex has 80% narrowing, is diffusely diseased and totally  occluded.  5. The saphenous vein graft has at least 2 valves, but appears to be  widely patent inserting into the obtuse marginal system. The OM  system was without a critical focal narrowing, but does have  somewhat diabetic appearance.  6. The right coronary artery is moderately diseased throughout. There  is 30% narrowing proximally and 90% mid narrowing. There is  diffuse luminal irregularity throughout. There is competitive  filling of the PDA and diffuse disease after this leading into the  posterolateral system with at least 80% narrowing. There is some  filling of the distal vessel through the native circulation.  7. The saphenous vein graft to the PDA and PLA is intact and  retrograde fills the RCA system.  CONCLUSION:  1. Continued patency of the internal mammary to the LAD.  2. Continued patency of the saphenous vein graft to the OM.  3. Continued  sequential saphenous vein graft to the PDA and  Echo 06/17/14: Left ventricle: The cavity size was normal. Systolic function was severely reduced. The estimated ejection fraction was in the range of 25% to 30%. Diffuse hypokinesis. There is hypokinesis of the inferoseptal and apical myocardium. Features are consistent with a pseudonormal left ventricular filling pattern, with concomitant abnormal relaxation and increased filling  pressure (grade 2 diastolic dysfunction). - Mitral valve: Moderately calcified annulus. - Left atrium: The atrium was mildly dilated. - Right ventricle: The cavity size was mildly dilated. Wall thickness was normal. - Right atrium: The atrium was mildly dilated. - Pulmonary arteries: Systolic pressure was moderately increased. PA peak pressure: 51 mm Hg (S). Impressions: - EF is reduced when compared to prior echocardiogram.   Assessment and Plan:   1. Coronary artery disease: Stable without chest pains but LVEF is reduced on recent echo. Will plan cardiac cath on 06/24/14 with possible PCI. Risks and benefits reviewed today. He agrees to proceed.  Continue ASA, statin, beta blocker, Ace-inh.   2. Hyperlipidemia:  He is on a statin. Lipids followed in primary care.   3. Carotid artery disease: Followed in [facility] and stable by dopplers January 2016.   4. HTN: BP controlled. No changes today.   5. Cardiomyopathy: LVEF is reduced as above. Prior to his bypass in 2007 his LVEF was around 30% but improved to 60% post bypass. Now LVEF is 30%. Plan cath at above.

## 2014-06-25 ENCOUNTER — Telehealth: Payer: Self-pay | Admitting: Cardiovascular Disease

## 2014-06-25 DIAGNOSIS — I10 Essential (primary) hypertension: Secondary | ICD-10-CM | POA: Diagnosis not present

## 2014-06-25 DIAGNOSIS — I4891 Unspecified atrial fibrillation: Secondary | ICD-10-CM

## 2014-06-25 DIAGNOSIS — I255 Ischemic cardiomyopathy: Secondary | ICD-10-CM | POA: Diagnosis not present

## 2014-06-25 DIAGNOSIS — I25700 Atherosclerosis of coronary artery bypass graft(s), unspecified, with unstable angina pectoris: Secondary | ICD-10-CM | POA: Diagnosis not present

## 2014-06-25 DIAGNOSIS — I2582 Chronic total occlusion of coronary artery: Secondary | ICD-10-CM | POA: Diagnosis not present

## 2014-06-25 DIAGNOSIS — I429 Cardiomyopathy, unspecified: Secondary | ICD-10-CM | POA: Diagnosis not present

## 2014-06-25 DIAGNOSIS — Z951 Presence of aortocoronary bypass graft: Secondary | ICD-10-CM | POA: Diagnosis not present

## 2014-06-25 DIAGNOSIS — E1122 Type 2 diabetes mellitus with diabetic chronic kidney disease: Secondary | ICD-10-CM | POA: Diagnosis not present

## 2014-06-25 DIAGNOSIS — I2510 Atherosclerotic heart disease of native coronary artery without angina pectoris: Secondary | ICD-10-CM | POA: Diagnosis not present

## 2014-06-25 DIAGNOSIS — I272 Other secondary pulmonary hypertension: Secondary | ICD-10-CM | POA: Diagnosis not present

## 2014-06-25 DIAGNOSIS — I129 Hypertensive chronic kidney disease with stage 1 through stage 4 chronic kidney disease, or unspecified chronic kidney disease: Secondary | ICD-10-CM | POA: Diagnosis not present

## 2014-06-25 DIAGNOSIS — N183 Chronic kidney disease, stage 3 (moderate): Secondary | ICD-10-CM | POA: Diagnosis not present

## 2014-06-25 LAB — GLUCOSE, CAPILLARY: Glucose-Capillary: 112 mg/dL — ABNORMAL HIGH (ref 70–99)

## 2014-06-25 LAB — BASIC METABOLIC PANEL
Anion gap: 8 (ref 5–15)
BUN: 26 mg/dL — ABNORMAL HIGH (ref 6–23)
CO2: 28 mmol/L (ref 19–32)
Calcium: 8.7 mg/dL (ref 8.4–10.5)
Chloride: 103 mmol/L (ref 96–112)
Creatinine, Ser: 1.32 mg/dL (ref 0.50–1.35)
GFR calc Af Amer: 59 mL/min — ABNORMAL LOW (ref >90)
GFR calc non Af Amer: 51 mL/min — ABNORMAL LOW (ref >90)
Glucose, Bld: 101 mg/dL — ABNORMAL HIGH (ref 70–99)
Potassium: 4 mmol/L (ref 3.5–5.1)
Sodium: 139 mmol/L (ref 135–145)

## 2014-06-25 LAB — CBC
HCT: 37.7 % — ABNORMAL LOW (ref 39.0–52.0)
Hemoglobin: 12.5 g/dL — ABNORMAL LOW (ref 13.0–17.0)
MCH: 29.9 pg (ref 26.0–34.0)
MCHC: 33.2 g/dL (ref 30.0–36.0)
MCV: 90.2 fL (ref 78.0–100.0)
Platelets: 165 10*3/uL (ref 150–400)
RBC: 4.18 MIL/uL — ABNORMAL LOW (ref 4.22–5.81)
RDW: 15.1 % (ref 11.5–15.5)
WBC: 6.8 10*3/uL (ref 4.0–10.5)

## 2014-06-25 LAB — HEPARIN LEVEL (UNFRACTIONATED): Heparin Unfractionated: 0.29 IU/mL — ABNORMAL LOW (ref 0.30–0.70)

## 2014-06-25 MED ORDER — METFORMIN HCL 500 MG PO TABS: 1000.0000 mg | ORAL_TABLET | Freq: Two times a day (BID) | ORAL | Status: DC

## 2014-06-25 MED ORDER — APIXABAN 5 MG PO TABS
5.0000 mg | ORAL_TABLET | Freq: Two times a day (BID) | ORAL | Status: DC
  Administered 2014-06-25: 09:00:00 5 mg via ORAL
  Filled 2014-06-25 (×3): qty 1

## 2014-06-25 MED ORDER — OFF THE BEAT BOOK
Freq: Once | Status: AC
  Administered 2014-06-25: 06:00:00
  Filled 2014-06-25: qty 1

## 2014-06-25 MED ORDER — ACETAMINOPHEN 325 MG PO TABS: 650.0000 mg | ORAL_TABLET | ORAL | Status: DC | PRN

## 2014-06-25 MED ORDER — METOPROLOL SUCCINATE ER 50 MG PO TB24: 50.0000 mg | ORAL_TABLET | Freq: Every day | ORAL | Status: DC

## 2014-06-25 MED ORDER — APIXABAN 5 MG PO TABS: 5.0000 mg | ORAL_TABLET | Freq: Two times a day (BID) | ORAL | Status: DC

## 2014-06-25 NOTE — Progress Notes (Signed)
    Subjective:  He can tell he is in AF but overall feels OK  Objective:  Vital Signs in the last 24 hours: Temp:  [97.5 F (36.4 C)-98.1 F (36.7 C)] 97.7 F (36.5 C) (04/01 0538) Pulse Rate:  [61-114] 68 (04/01 0538) Resp:  [11-18] 14 (04/01 0538) BP: (109-152)/(49-85) 152/62 mmHg (04/01 0538) SpO2:  [98 %-100 %] 100 % (04/01 0538) Weight:  [195 lb (88.451 kg)-195 lb 12.3 oz (88.8 kg)] 195 lb 12.3 oz (88.8 kg) (04/01 0019)  Intake/Output from previous day:  Intake/Output Summary (Last 24 hours) at 06/25/14 0710 Last data filed at 06/25/14 0600  Gross per 24 hour  Intake 296.67 ml  Output   2575 ml  Net -2278.33 ml    Physical Exam: General appearance: alert, cooperative and no distress Lungs: clear to auscultation bilaterally Heart: irregularly irregular rhythm Extremities: Rt groin without hematoma   Rate: 84  Rhythm: atrial fibrillation  Lab Results:  Recent Labs  06/25/14 0329  WBC 6.8  HGB 12.5*  PLT 165    Recent Labs  06/25/14 0329  NA 139  K 4.0  CL 103  CO2 28  GLUCOSE 101*  BUN 26*  CREATININE 1.32   No results for input(s): TROPONINI in the last 72 hours.  Invalid input(s): CK, MB No results for input(s): INR in the last 72 hours.  Imaging: Imaging results have been reviewed  Cardiac Studies:Echo 06/17/14 Study Conclusions  - Left ventricle: The cavity size was normal. Systolic function was severely reduced. The estimated ejection fraction was in the range of 25% to 30%. Diffuse hypokinesis. There is hypokinesis of the inferoseptal and apical myocardium. Features are consistent with a pseudonormal left ventricular filling pattern, with concomitant abnormal relaxation and increased filling pressure (grade 2 diastolic dysfunction). - Mitral valve: Moderately calcified annulus. - Left atrium: The atrium was mildly dilated. - Right ventricle: The cavity size was mildly dilated. Wall thickness was normal. - Right  atrium: The atrium was mildly dilated. - Pulmonary arteries: Systolic pressure was moderately increased. PA peak pressure: 51 mm Hg (S).  Impressions:  - EF is reduced when compared to prior echocardiogram.   Assessment/Plan:  79 yo male with history of DM, HTN, HLD, CAD s/p 4V CABG in 2007, moderate carotid artery disease (followed by Dr Kellie Simmering) with recent echo demonstrating new reduction in LV function. He is s/p 4V CABG 2007 (LIMA to LAD, SVG to OM, SVG sequential to PLA/PDA). Last cath February 2010 with 4 patent grafts, severe native vessel disease. Seen by Dr Julianne Handler 06/07/14 and he was doing well. He arranged an echo to update his LVEF. Echo  on 06/17/14 showed LVEF=25-30%, akinesis of the inferoseptum and apex. He was admitted for cath and noted to be in AF (new). His grafts were patent at cath.    Principal Problem:   Cardiomyopathy- new drop in EF- ? secondary to new AF Active Problems:   CABG x 4 '07, patent grafts 2010, 06/24/14   Atrial fibrillation with rapid ventricular response   Type 2 diabetes mellitus with renal manifestations, controlled   Chronic renal insufficiency, stage III (moderate)   Dyslipidemia   Essential hypertension   Pulmonary hypertension-PA 51 mmHg    PVD - moderate bilateral carotid disease   PLAN:  Per Dr Julianne Handler- rate control, Toprol increased, start Eliquis, DCCV 4 weeks. Home later this am.   Kerin Ransom PA-C Beeper [id number] 06/25/2014, 7:10 AM

## 2014-06-25 NOTE — Discharge Instructions (Signed)
Stroke Prevention Some medical conditions and behaviors are associated with an increased chance of having a stroke. You may prevent a stroke by making healthy choices and managing medical conditions. HOW CAN I REDUCE MY RISK OF HAVING A STROKE?   Stay physically active. Get at least 30 minutes of activity on most or all days.  Do not smoke. It may also be helpful to avoid exposure to secondhand smoke.  Limit alcohol use. Moderate alcohol use is considered to be:  No more than 2 drinks per day for men.  No more than 1 drink per day for nonpregnant women.  Eat healthy foods. This involves:  Eating 5 or more servings of fruits and vegetables a day.  Making dietary changes that address high blood pressure (hypertension), high cholesterol, diabetes, or obesity.  Manage your cholesterol levels.  Making food choices that are high in fiber and low in saturated fat, trans fat, and cholesterol may control cholesterol levels.  Take any prescribed medicines to control cholesterol as directed by your health care provider.  Manage your diabetes.  Controlling your carbohydrate and sugar intake is recommended to manage diabetes.  Take any prescribed medicines to control diabetes as directed by your health care provider.  Control your hypertension.  Making food choices that are low in salt (sodium), saturated fat, trans fat, and cholesterol is recommended to manage hypertension.  Take any prescribed medicines to control hypertension as directed by your health care provider.  Maintain a healthy weight.  Reducing calorie intake and making food choices that are low in sodium, saturated fat, trans fat, and cholesterol are recommended to manage weight.  Stop drug abuse.  Avoid taking birth control pills.  Talk to your health care provider about the risks of taking birth control pills if you are over 37 years old, smoke, get migraines, or have ever had a blood clot.  Get evaluated for sleep  disorders (sleep apnea).  Talk to your health care provider about getting a sleep evaluation if you snore a lot or have excessive sleepiness.  Take medicines only as directed by your health care provider.  For some people, aspirin or blood thinners (anticoagulants) are helpful in reducing the risk of forming abnormal blood clots that can lead to stroke. If you have the irregular heart rhythm of atrial fibrillation, you should be on a blood thinner unless there is a good reason you cannot take them.  Understand all your medicine instructions.  Make sure that other conditions (such as anemia or atherosclerosis) are addressed. SEEK IMMEDIATE MEDICAL CARE IF:   You have sudden weakness or numbness of the face, arm, or leg, especially on one side of the body.  Your face or eyelid droops to one side.  You have sudden confusion.  You have trouble speaking (aphasia) or understanding.  You have sudden trouble seeing in one or both eyes.  You have sudden trouble walking.  You have dizziness.  You have a loss of balance or coordination.  You have a sudden, severe headache with no known cause.  You have new chest pain or an irregular heartbeat. Any of these symptoms may represent a serious problem that is an emergency. Do not wait to see if the symptoms will go away. Get medical help at once. Call your local emergency services (911 in [place] Do not drive yourself to the hospital. Document Released: 04/19/2004 Document Revised: 07/27/2013 Document Reviewed: 09/12/2012 [facility] Patient Information 2015 [place], [place]. This information is not intended to replace advice given  to you by your health care provider. Make sure you discuss any questions you have with your health care provider. Atrial Fibrillation Atrial fibrillation is a type of irregular heart rhythm (arrhythmia). During atrial fibrillation, the upper chambers of the heart (atria) quiver continuously in a chaotic pattern. This causes  an irregular and often rapid heart rate.  Atrial fibrillation is the result of the heart becoming overloaded with disorganized signals that tell it to beat. These signals are normally released one at a time by a part of the right atrium called the sinoatrial node. They then travel from the atria to the lower chambers of the heart (ventricles), causing the atria and ventricles to contract and pump blood as they pass. In atrial fibrillation, parts of the atria outside of the sinoatrial node also release these signals. This results in two problems. First, the atria receive so many signals that they do not have time to fully contract. Second, the ventricles, which can only receive one signal at a time, beat irregularly and out of rhythm with the atria.  There are three types of atrial fibrillation:   Paroxysmal. Paroxysmal atrial fibrillation starts suddenly and stops on its own within a week.  Persistent. Persistent atrial fibrillation lasts for more than a week. It may stop on its own or with treatment.  Permanent. Permanent atrial fibrillation does not go away. Episodes of atrial fibrillation may lead to permanent atrial fibrillation. Atrial fibrillation can prevent your heart from pumping blood normally. It increases your risk of stroke and can lead to heart failure.  CAUSES   Heart conditions, including a heart attack, heart failure, coronary artery disease, and heart valve conditions.   Inflammation of the sac that surrounds the heart (pericarditis).  Blockage of an artery in the lungs (pulmonary embolism).  Pneumonia or other infections.  Chronic lung disease.  Thyroid problems, especially if the thyroid is overactive (hyperthyroidism).  Caffeine, excessive alcohol use, and use of some illegal drugs.   Use of some medicines, including certain decongestants and diet pills.  Heart surgery.   Birth defects.  Sometimes, no cause can be found. When this happens, the atrial  fibrillation is called lone atrial fibrillation. The risk of complications from atrial fibrillation increases if you have lone atrial fibrillation and you are age 55 years or older. RISK FACTORS  Heart failure.  Coronary artery disease.  Diabetes mellitus.   High blood pressure (hypertension).   Obesity.   Other arrhythmias.   Increased age. SIGNS AND SYMPTOMS   A feeling that your heart is beating rapidly or irregularly.   A feeling of discomfort or pain in your chest.   Shortness of breath.   Sudden light-headedness or weakness.   Getting tired easily when exercising.   Urinating more often than normal (mainly when atrial fibrillation first begins).  In paroxysmal atrial fibrillation, symptoms may start and suddenly stop. DIAGNOSIS  Your health care provider may be able to detect atrial fibrillation when taking your pulse. Your health care provider may have you take a test called an ambulatory electrocardiogram (ECG). An ECG records your heartbeat patterns over a 24-hour period. You may also have other tests, such as:  Transthoracic echocardiogram (TTE). During echocardiography, sound waves are used to evaluate how blood flows through your heart.  Transesophageal echocardiogram (TEE).  Stress test. There is more than one type of stress test. If a stress test is needed, ask your health care provider about which type is best for you.  Chest X-ray exam.  Blood tests.  Computed tomography (CT). TREATMENT  Treatment may include:  Treating any underlying conditions. For example, if you have an overactive thyroid, treating the condition may correct atrial fibrillation.  Taking medicine. Medicines may be given to control a rapid heart rate or to prevent blood clots, heart failure, or a stroke.  Having a procedure to correct the rhythm of the heart:  Electrical cardioversion. During electrical cardioversion, a controlled, low-energy shock is delivered to the  heart through your skin. If you have chest pain, very low blood pressure, or sudden heart failure, this procedure may need to be done as an emergency.  Catheter ablation. During this procedure, heart tissues that send the signals that cause atrial fibrillation are destroyed.  Surgical ablation. During this surgery, thin lines of heart tissue that carry the abnormal signals are destroyed. This procedure can either be an open-heart surgery or a minimally invasive surgery. With the minimally invasive surgery, small cuts are made to access the heart instead of a large opening.  Pulmonary venous isolation. During this surgery, tissue around the veins that carry blood from the lungs (pulmonary veins) is destroyed. This tissue is thought to carry the abnormal signals. HOME CARE INSTRUCTIONS   Take medicines only as directed by your health care provider. Some medicines can make atrial fibrillation worse or recur.  If blood thinners were prescribed by your health care provider, take them exactly as directed. Too much blood-thinning medicine can cause bleeding. If you take too little, you will not have the needed protection against stroke and other problems.  Perform blood tests at home if directed by your health care provider. Perform blood tests exactly as directed.  Quit smoking if you smoke.  Do not drink alcohol.  Do not drink caffeinated beverages such as coffee, soda, and some teas. You may drink decaffeinated coffee, soda, or tea.   Maintain a healthy weight.Do not use diet pills unless your health care provider approves. They may make heart problems worse.   Follow diet instructions as directed by your health care provider.  Exercise regularly as directed by your health care provider.  Keep all follow-up visits as directed by your health care provider. This is important. PREVENTION  The following substances can cause atrial fibrillation to recur:   Caffeinated  beverages.  Alcohol.  Certain medicines, especially those used for breathing problems.  Certain herbs and herbal medicines, such as those containing ephedra or ginseng.  Illegal drugs, such as cocaine and amphetamines. Sometimes medicines are given to prevent atrial fibrillation from recurring. Proper treatment of any underlying condition is also important in helping prevent recurrence.  SEEK MEDICAL CARE IF:  You notice a change in the rate, rhythm, or strength of your heartbeat.  You suddenly begin urinating more frequently.  You tire more easily when exerting yourself or exercising. SEEK IMMEDIATE MEDICAL CARE IF:   You have chest pain, abdominal pain, sweating, or weakness.  You feel nauseous.  You have shortness of breath.  You suddenly have swollen feet and ankles.  You feel dizzy.  Your face or limbs feel numb or weak.  You have a change in your vision or speech. MAKE SURE YOU:   Understand these instructions.  Will watch your condition.  Will get help right away if you are not doing well or get worse. Document Released: 03/12/2005 Document Revised: 07/27/2013 Document Reviewed: 04/22/2012 [facility] Patient Information 2015 [place], [place]. This information is not intended to replace advice given to you by your health care  provider. Make sure you discuss any questions you have with your health care provider. Atrial Fibrillation Atrial fibrillation is a type of irregular heart rhythm (arrhythmia). During atrial fibrillation, the upper chambers of the heart (atria) quiver continuously in a chaotic pattern. This causes an irregular and often rapid heart rate.  Atrial fibrillation is the result of the heart becoming overloaded with disorganized signals that tell it to beat. These signals are normally released one at a time by a part of the right atrium called the sinoatrial node. They then travel from the atria to the lower chambers of the heart (ventricles), causing the  atria and ventricles to contract and pump blood as they pass. In atrial fibrillation, parts of the atria outside of the sinoatrial node also release these signals. This results in two problems. First, the atria receive so many signals that they do not have time to fully contract. Second, the ventricles, which can only receive one signal at a time, beat irregularly and out of rhythm with the atria.  There are three types of atrial fibrillation:   Paroxysmal. Paroxysmal atrial fibrillation starts suddenly and stops on its own within a week.  Persistent. Persistent atrial fibrillation lasts for more than a week. It may stop on its own or with treatment.  Permanent. Permanent atrial fibrillation does not go away. Episodes of atrial fibrillation may lead to permanent atrial fibrillation. Atrial fibrillation can prevent your heart from pumping blood normally. It increases your risk of stroke and can lead to heart failure.  CAUSES   Heart conditions, including a heart attack, heart failure, coronary artery disease, and heart valve conditions.   Inflammation of the sac that surrounds the heart (pericarditis).  Blockage of an artery in the lungs (pulmonary embolism).  Pneumonia or other infections.  Chronic lung disease.  Thyroid problems, especially if the thyroid is overactive (hyperthyroidism).  Caffeine, excessive alcohol use, and use of some illegal drugs.   Use of some medicines, including certain decongestants and diet pills.  Heart surgery.   Birth defects.  Sometimes, no cause can be found. When this happens, the atrial fibrillation is called lone atrial fibrillation. The risk of complications from atrial fibrillation increases if you have lone atrial fibrillation and you are age 23 years or older. RISK FACTORS  Heart failure.  Coronary artery disease.  Diabetes mellitus.   High blood pressure (hypertension).   Obesity.   Other arrhythmias.   Increased age. SIGNS  AND SYMPTOMS   A feeling that your heart is beating rapidly or irregularly.   A feeling of discomfort or pain in your chest.   Shortness of breath.   Sudden light-headedness or weakness.   Getting tired easily when exercising.   Urinating more often than normal (mainly when atrial fibrillation first begins).  In paroxysmal atrial fibrillation, symptoms may start and suddenly stop. DIAGNOSIS  Your health care provider may be able to detect atrial fibrillation when taking your pulse. Your health care provider may have you take a test called an ambulatory electrocardiogram (ECG). An ECG records your heartbeat patterns over a 24-hour period. You may also have other tests, such as:  Transthoracic echocardiogram (TTE). During echocardiography, sound waves are used to evaluate how blood flows through your heart.  Transesophageal echocardiogram (TEE).  Stress test. There is more than one type of stress test. If a stress test is needed, ask your health care provider about which type is best for you.  Chest X-ray exam.  Blood tests.  Computed tomography (  CT). TREATMENT  Treatment may include:  Treating any underlying conditions. For example, if you have an overactive thyroid, treating the condition may correct atrial fibrillation.  Taking medicine. Medicines may be given to control a rapid heart rate or to prevent blood clots, heart failure, or a stroke.  Having a procedure to correct the rhythm of the heart:  Electrical cardioversion. During electrical cardioversion, a controlled, low-energy shock is delivered to the heart through your skin. If you have chest pain, very low blood pressure, or sudden heart failure, this procedure may need to be done as an emergency.  Catheter ablation. During this procedure, heart tissues that send the signals that cause atrial fibrillation are destroyed.  Surgical ablation. During this surgery, thin lines of heart tissue that carry the abnormal  signals are destroyed. This procedure can either be an open-heart surgery or a minimally invasive surgery. With the minimally invasive surgery, small cuts are made to access the heart instead of a large opening.  Pulmonary venous isolation. During this surgery, tissue around the veins that carry blood from the lungs (pulmonary veins) is destroyed. This tissue is thought to carry the abnormal signals. HOME CARE INSTRUCTIONS   Take medicines only as directed by your health care provider. Some medicines can make atrial fibrillation worse or recur.  If blood thinners were prescribed by your health care provider, take them exactly as directed. Too much blood-thinning medicine can cause bleeding. If you take too little, you will not have the needed protection against stroke and other problems.  Perform blood tests at home if directed by your health care provider. Perform blood tests exactly as directed.  Quit smoking if you smoke.  Do not drink alcohol.  Do not drink caffeinated beverages such as coffee, soda, and some teas. You may drink decaffeinated coffee, soda, or tea.   Maintain a healthy weight.Do not use diet pills unless your health care provider approves. They may make heart problems worse.   Follow diet instructions as directed by your health care provider.  Exercise regularly as directed by your health care provider.  Keep all follow-up visits as directed by your health care provider. This is important. PREVENTION  The following substances can cause atrial fibrillation to recur:   Caffeinated beverages.  Alcohol.  Certain medicines, especially those used for breathing problems.  Certain herbs and herbal medicines, such as those containing ephedra or ginseng.  Illegal drugs, such as cocaine and amphetamines. Sometimes medicines are given to prevent atrial fibrillation from recurring. Proper treatment of any underlying condition is also important in helping prevent  recurrence.  SEEK MEDICAL CARE IF:  You notice a change in the rate, rhythm, or strength of your heartbeat.  You suddenly begin urinating more frequently.  You tire more easily when exerting yourself or exercising. SEEK IMMEDIATE MEDICAL CARE IF:   You have chest pain, abdominal pain, sweating, or weakness.  You feel nauseous.  You have shortness of breath.  You suddenly have swollen feet and ankles.  You feel dizzy.  Your face or limbs feel numb or weak.  You have a change in your vision or speech. MAKE SURE YOU:   Understand these instructions.  Will watch your condition.  Will get help right away if you are not doing well or get worse. Document Released: 03/12/2005 Document Revised: 07/27/2013 Document Reviewed: 04/22/2012 [facility] Patient Information 2015 [place], [place]. This information is not intended to replace advice given to you by your health care provider. Make sure you discuss  any questions you have with your health care provider.  Information on my medicine - ELIQUIS (apixaban)  This medication education was reviewed with me or my healthcare representative as part of my discharge preparation.  Why was Eliquis prescribed for you? Eliquis was prescribed for you to reduce the risk of a blood clot forming that can cause a stroke if you have a medical condition called atrial fibrillation (a type of irregular heartbeat).  What do You need to know about Eliquis ? Take your Eliquis TWICE DAILY - one tablet in the morning and one tablet in the evening with or without food. If you have difficulty swallowing the tablet whole please discuss with your pharmacist how to take the medication safely.  Take Eliquis exactly as prescribed by your doctor and DO NOT stop taking Eliquis without talking to the doctor who prescribed the medication.  Stopping may increase your risk of developing a stroke.  Refill your prescription before you run out.  After discharge, you  should have regular check-up appointments with your healthcare provider that is prescribing your Eliquis.  In the future your dose may need to be changed if your kidney function or weight changes by a significant amount or as you get older.  What do you do if you miss a dose? If you miss a dose, take it as soon as you remember on the same day and resume taking twice daily.  Do not take more than one dose of ELIQUIS at the same time to make up a missed dose.  Important Safety Information A possible side effect of Eliquis is bleeding. You should call your healthcare provider right away if you experience any of the following: ? Bleeding from an injury or your nose that does not stop. ? Unusual colored urine (red or dark brown) or unusual colored stools (red or black). ? Unusual bruising for unknown reasons. ? A serious fall or if you hit your head (even if there is no bleeding).  Some medicines may interact with Eliquis and might increase your risk of bleeding or clotting while on Eliquis. To help avoid this, consult your healthcare provider or pharmacist prior to using any new prescription or non-prescription medications, including herbals, vitamins, non-steroidal anti-inflammatory drugs (NSAIDs) and supplements.  This website has more information on Eliquis (apixaban): http://www.eliquis.com/eliquis/home

## 2014-06-25 NOTE — Telephone Encounter (Signed)
NEw Message  Pt had Cath on 3/31 and was told to have f/u after taking Eliquis for a Month (started today-4/1). Pt wanted to see if his f/u (currently set at 4/25) should be a little later to accommodate the 4 wks of taking eliquis. Also, L Kilory sent a staff message to set up eph for pt 3 weeks from today.  Pt will keep appt on 4/25 until further notice. Please call back and discuss.

## 2014-06-25 NOTE — Telephone Encounter (Signed)
Dr. Angelena Form would like to see pt prior to April 25,2016.  I spoke with pt and appt made for him to see Dr. Angelena Form on April 14,2016 at 2:15.

## 2014-06-25 NOTE — Progress Notes (Signed)
ANTICOAGULATION CONSULT NOTE - Initial Consult  Pharmacy Consult for Heparin Indication: atrial fibrillation  No Known Allergies  Patient Measurements: Height: 5\' 11"  (180.3 cm) Weight: 195 lb 12.3 oz (88.8 kg) IBW/kg (Calculated) : 75.3  Vital Signs: Temp: 97.5 F (36.4 C) (04/01 0019) Temp Source: Oral (04/01 0019) BP: 114/49 mmHg (04/01 0019) Pulse Rate: 66 (04/01 0019)  Labs:  Recent Labs  06/25/14 0329 06/25/14 0337  HGB 12.5*  --   HCT 37.7*  --   PLT 165  --   HEPARINUNFRC  --  0.29*  CREATININE 1.32  --     Estimated Creatinine Clearance: 50.7 mL/min (by C-G formula based on Cr of 1.32).   Medical History: Past Medical History  Diagnosis Date  . DIABETES MELLITUS, TYPE II   . HYPERCHOLESTEROLEMIA   . UNSPECIFIED THROMBOCYTOPENIA   . HYPERTENSION   . CAD, ARTERY BYPASS GRAFT   . PULMONARY HYPERTENSION   . CAROTID ARTERY STENOSIS   . RENAL INSUFFICIENCY, CHRONIC   . ANEMIA, HX OF   . Myocardial infarction June 2007  . Cataract   . Diverticulosis   . Diabetic retinal damage of right eye 2015  . Bronchial pneumonia Jan. 2016  . New onset atrial fibrillation 06/24/2014    Assessment: 79 year old male s/p cath today to start heparin 8 hours post sheath pull for Afib with plans to start Eliquis in AM if groin is stable.  Follow-up HL is slightly SUBtherapeutic at 0.29 on heparin 1300 units/hr. No bleeding is noted. Hgb 12.5, Plt 165, sCr 1.3.  Goal of Therapy:  Heparin level 0.3-0.7 units/ml Monitor platelets by anticoagulation protocol: Yes   Plan:  Continue heparin 1300 units/hr Daily heparin level, CBC F/u long-term anticoag plan  Andrey Cota. Diona Foley, PharmD Clinical Pharmacist Pager [phone] 06/25/2014,4:45 AM

## 2014-06-25 NOTE — Discharge Summary (Signed)
Patient ID: [name] Heward.,  MRN: [id number], DOB/AGE: Jun 10, 1935 79 y.o.  Admit date: 06/24/2014 Discharge date: 06/25/2014  Primary Care Provider: Shirline Frees, MD Primary Cardiologist: Dr Julianne Handler  Discharge Diagnoses Principal Problem:   Cardiomyopathy- new drop in EF- ? secondary to new AF Active Problems:   CABG x 4 '07, patent grafts 2010, 06/24/14   Atrial fibrillation with rapid ventricular response   Type 2 diabetes mellitus with renal manifestations, controlled   Chronic renal insufficiency, stage III (moderate)   Dyslipidemia   Essential hypertension   Pulmonary hypertension-PA 51 mmHg    PVD - moderate bilateral carotid disease    Procedures:  Coronary angiogram 06/24/14   Hospital Course:  79 yo male with history of DM, HTN, HLD, CAD s/p 4V CABG in 2007, moderate carotid artery disease (followed by Dr Kellie Simmering) with recent echo demonstrating new reduction in LV function. He is s/p 4V CABG 2007 (LIMA to LAD, SVG to OM, SVG sequential to PLA/PDA). Last cath February 2010 with 4 patent grafts, severe native vessel disease. Seen by Dr Julianne Handler 06/07/14 and he was doing well. He arranged an echo to update his LVEF. Echo on 06/17/14 showed LVEF=25-30%, akinesis of the inferoseptum and apex. He was admitted for cath and noted to be in AF (new). His grafts were patent at cath. The plan per Dr Julianne Handler- rate control, Toprol increased, start Eliquis, DCCV 4 weeks.   Discharge Vitals:  Blood pressure 119/47, pulse 100, temperature 97.9 F (36.6 C), temperature source Oral, resp. rate 16, height 5\' 11"  (1.803 m), weight 195 lb 12.3 oz (88.8 kg), SpO2 100 %.    Labs: Results for orders placed or performed during the hospital encounter of 06/24/14 (from the past 24 hour(s))  Glucose, capillary     Status: Abnormal   Collection Time: 06/24/14  9:21 AM  Result Value Ref Range   Glucose-Capillary 110 (H) 70 - 99 mg/dL  Glucose, capillary     Status: Abnormal   Collection Time: 06/24/14 11:43 AM  Result Value Ref Range   Glucose-Capillary 106 (H) 70 - 99 mg/dL  Glucose, capillary     Status: Abnormal   Collection Time: 06/24/14  9:12 PM  Result Value Ref Range   Glucose-Capillary 146 (H) 70 - 99 mg/dL   Comment 1 Notify RN    Comment 2 Document in Chart   Basic metabolic panel     Status: Abnormal   Collection Time: 06/25/14  3:29 AM  Result Value Ref Range   Sodium 139 135 - 145 mmol/L   Potassium 4.0 3.5 - 5.1 mmol/L   Chloride 103 96 - 112 mmol/L   CO2 28 19 - 32 mmol/L   Glucose, Bld 101 (H) 70 - 99 mg/dL   BUN 26 (H) 6 - 23 mg/dL   Creatinine, Ser 1.32 0.50 - 1.35 mg/dL   Calcium 8.7 8.4 - 10.5 mg/dL   GFR calc non Af Amer 51 (L) >90 mL/min   GFR calc Af Amer 59 (L) >90 mL/min   Anion gap 8 5 - 15  CBC     Status: Abnormal   Collection Time: 06/25/14  3:29 AM  Result Value Ref Range   WBC 6.8 4.0 - 10.5 K/uL   RBC 4.18 (L) 4.22 - 5.81 MIL/uL   Hemoglobin 12.5 (L) 13.0 - 17.0 g/dL   HCT 37.7 (L) 39.0 - 52.0 %   MCV 90.2 78.0 - 100.0 fL   MCH 29.9 26.0 - 34.0  pg   MCHC 33.2 30.0 - 36.0 g/dL   RDW 15.1 11.5 - 15.5 %   Platelets 165 150 - 400 K/uL  Heparin level (unfractionated)     Status: Abnormal   Collection Time: 06/25/14  3:37 AM  Result Value Ref Range   Heparin Unfractionated 0.29 (L) 0.30 - 0.70 IU/mL  Glucose, capillary     Status: Abnormal   Collection Time: 06/25/14  7:05 AM  Result Value Ref Range   Glucose-Capillary 112 (H) 70 - 99 mg/dL   Comment 1 Notify RN    Comment 2 Document in Chart     Disposition:      Follow-up Information    Follow up with [name], MD.   Specialty:  Cardiology   Why:  office will contact you   Contact information:   [place]. [address] [place] [address] [phone]       Discharge Medications:    Medication List    STOP taking these medications        glipiZIDE-metformin 5-500 MG per tablet  Commonly known as:  METAGLIP      TAKE  these medications        acetaminophen 325 MG tablet  Commonly known as:  TYLENOL  Take 2 tablets (650 mg total) by mouth every 4 (four) hours as needed for headache or mild pain.     ACTOS 45 MG tablet  Generic drug:  pioglitazone  Take 45 mg by mouth daily. 1 tab daily     apixaban 5 MG Tabs tablet  Commonly known as:  ELIQUIS  Take 1 tablet (5 mg total) by mouth 2 (two) times daily.     aspirin 81 MG tablet  Take 81 mg by mouth daily.     calcium-vitamin D 500-200 MG-UNIT per tablet  Commonly known as:  OSCAL WITH D  Take 1 tablet by mouth. Calcium 600 mg     cholecalciferol 400 UNITS Tabs tablet  Commonly known as:  VITAMIN D  Take 400 Units by mouth daily. VITAMIN D #3     CO Q 10 PO  Take 300 mg by mouth daily.     fish oil-omega-3 fatty acids 1000 MG capsule  Take 1 g by mouth daily.     glipiZIDE 5 MG tablet  Commonly known as:  GLUCOTROL  Take 10 mg by mouth 2 (two) times daily before a meal.     losartan 50 MG tablet  Commonly known as:  COZAAR  Take 1 tablet (50 mg total) by mouth daily.     metFORMIN 500 MG tablet  Commonly known as:  GLUCOPHAGE  Take 2 tablets (1,000 mg total) by mouth 2 (two) times daily with a meal.  Start taking on:  06/27/2014     metoprolol succinate 50 MG 24 hr tablet  Commonly known as:  TOPROL-XL  Take 1 tablet (50 mg total) by mouth daily. Take with or immediately following a meal.     MULTI-DAY VITAMINS PO  Take 1 tablet by mouth daily. 1 tab daily     omeprazole 20 MG capsule  Commonly known as:  PRILOSEC  Take 20 mg by mouth 2 (two) times daily before a meal. 1 tab twice a day     simvastatin 20 MG tablet  Commonly known as:  ZOCOR  Take 20 mg by mouth daily at 6 PM. 1 tab daily     Turmeric 450 MG Caps  Take 1,350 mg by mouth  daily.     vitamin C 500 MG tablet  Commonly known as:  ASCORBIC ACID  Take 500 mg by mouth daily.     vitamin E 1000 UNIT capsule  Take 1,000 Units by mouth every other day.          Duration of Discharge Encounter: Greater than 30 minutes including physician time.  Angelena Form PA-C 06/25/2014 8:28 AM

## 2014-07-08 ENCOUNTER — Encounter: Payer: Self-pay | Admitting: Cardiovascular Disease

## 2014-07-08 ENCOUNTER — Ambulatory Visit (INDEPENDENT_AMBULATORY_CARE_PROVIDER_SITE_OTHER): Payer: Commercial Managed Care - HMO | Admitting: Cardiovascular Disease

## 2014-07-08 VITALS — BP 128/68 | HR 69 | Ht 72.0 in | Wt 213.4 lb

## 2014-07-08 DIAGNOSIS — I10 Essential (primary) hypertension: Secondary | ICD-10-CM | POA: Diagnosis not present

## 2014-07-08 DIAGNOSIS — I481 Persistent atrial fibrillation: Secondary | ICD-10-CM | POA: Diagnosis not present

## 2014-07-08 DIAGNOSIS — E7800 Pure hypercholesterolemia, unspecified: Secondary | ICD-10-CM

## 2014-07-08 DIAGNOSIS — I6523 Occlusion and stenosis of bilateral carotid arteries: Secondary | ICD-10-CM

## 2014-07-08 DIAGNOSIS — I25700 Atherosclerosis of coronary artery bypass graft(s), unspecified, with unstable angina pectoris: Secondary | ICD-10-CM

## 2014-07-08 DIAGNOSIS — I4819 Other persistent atrial fibrillation: Secondary | ICD-10-CM

## 2014-07-08 DIAGNOSIS — I420 Dilated cardiomyopathy: Secondary | ICD-10-CM

## 2014-07-08 MED ORDER — AMIODARONE HCL 200 MG PO TABS: 200.0000 mg | ORAL_TABLET | Freq: Every day | ORAL | Status: DC

## 2014-07-08 NOTE — Progress Notes (Signed)
Chief Complaint  Patient presents with  . Leg Swelling     History of Present Illness: 79 yo male with history of DM, HTN, HLD, CAD s/p 4V CABG in 2007, carotid artery disease here today for follow up. He has been followed by Dr. Lia Foyer. He underwent 4V CABG 2007 (LIMA to LAD, SVG to OM, SVG sequential to PLA/PDA). Last cath February 2010 with 4 patent grafts, severe native vessel disease. He is known to have mild pulmonary artery HTN. Carotid artery disease followed in [facility] by Dr. Kellie Simmering. Last carotid doppler 04/06/14 with 60-79% RICA stenosis, less than [id number] LICA stenosis. I saw him 06/07/14 and he was doing well. I arranged an echo on 06/17/14 which showed LVEF=25-30%, akinesis of the inferoseptum and apex. No significant valve disease. I arranged a cardiac cath on 05/27/14 which showed severe native vessel CAD and 4 patent bypass grafts. He was noted to be in atrial fibrillation the day of the cardiac cath. He was started on Eliquis 5 mg po BID. Toprol was increased to 50 mg daily.   He is here today for follow up. He is feeling well overall. No palpitations or chest pain. He has some SOB and fatigue. No PND, orthopnea, LE edema.   Primary Care Physician: Shirline Frees  Last Lipid Profile: Followed in primary care.   Past Medical History  Diagnosis Date  . DIABETES MELLITUS, TYPE II   . HYPERCHOLESTEROLEMIA   . UNSPECIFIED THROMBOCYTOPENIA   . HYPERTENSION   . CAD, ARTERY BYPASS GRAFT   . PULMONARY HYPERTENSION   . CAROTID ARTERY STENOSIS   . RENAL INSUFFICIENCY, CHRONIC   . ANEMIA, HX OF   . Myocardial infarction June 2007  . Cataract   . Diverticulosis   . Diabetic retinal damage of right eye 2015  . Bronchial pneumonia Jan. 2016  . New onset atrial fibrillation 06/24/2014    Past Surgical History  Procedure Laterality Date  . Coronary artery bypass graft       quad bypass/2007  . Tonsillectomy    . Colonoscopy    . Polypectomy    . Cataract extraction  2012    right eye    . Eye surgery    . Cardiac catheterization  06/24/2014  . Left heart catheterization with coronary/graft angiogram [name] 06/24/2014    Procedure: LEFT HEART CATHETERIZATION WITH Beatrix Fetters;  Surgeon: Burnell Blanks, MD;  Location: [facility] CATH LAB;  Service: Cardiovascular;  Laterality: [name];    Current Outpatient Prescriptions  Medication Sig Dispense Refill  . ACTOS 45 MG tablet Take 45 mg by mouth daily. 1 tab daily    . apixaban (ELIQUIS) 5 MG TABS tablet Take 1 tablet (5 mg total) by mouth 2 (two) times daily. 60 tablet 11  . aspirin 81 MG tablet Take 81 mg by mouth daily.      . calcium-vitamin D (OSCAL WITH D) 500-200 MG-UNIT per tablet Take 1 tablet by mouth. Calcium 600 mg    . cholecalciferol (VITAMIN D) 400 UNITS TABS Take 400 Units by mouth daily. VITAMIN D #3    . Coenzyme Q10 (CO Q 10 PO) Take 300 mg by mouth daily.    . fish oil-omega-3 fatty acids 1000 MG capsule Take 1 g by mouth daily.      [name] glipiZIDE (GLUCOTROL) 5 MG tablet Take 10 mg by mouth 2 (two) times daily before a meal.    . losartan (COZAAR) 50 MG tablet Take 1 tablet (50 mg total) by  mouth daily. 90 tablet 3  . metFORMIN (GLUCOPHAGE) 500 MG tablet Take 2 tablets (1,000 mg total) by mouth 2 (two) times daily with a meal.    . metoprolol succinate (TOPROL-XL) 50 MG 24 hr tablet Take 1 tablet (50 mg total) by mouth daily. Take with or immediately following a meal. 30 tablet 11  . Multiple Vitamin (MULTI-DAY VITAMINS PO) Take 1 tablet by mouth daily. 1 tab daily    . omeprazole (PRILOSEC) 20 MG capsule Take 20 mg by mouth 2 (two) times daily before a meal. 1 tab twice a day    . simvastatin (ZOCOR) 20 MG tablet Take 20 mg by mouth daily at 6 PM. 1 tab daily    . Turmeric 450 MG CAPS Take 1,350 mg by mouth daily.    . vitamin C (ASCORBIC ACID) 500 MG tablet Take 500 mg by mouth daily.      . vitamin E 1000 UNIT capsule Take 1,000 Units by mouth every other day.    [name] amiodarone (PACERONE) 200 MG tablet  Take 1 tablet (200 mg total) by mouth daily. 30 tablet 6   No current facility-administered medications for this visit.    No Known Allergies  History   Social History  . Marital Status: Married    Spouse Name: [name]  . Number of Children: 2  . Years of Education: [name]   Occupational History  . Retired-sales    Social History Main Topics  . Smoking status: Former Smoker    Types: Cigarettes    Quit date: 03/26/1970  . Smokeless tobacco: Never Used  . Alcohol Use: 0.6 oz/week    1 Glasses of wine per week  . Drug Use: No  . Sexual Activity: Not on file   Other Topics Concern  . Not on file   Social History Narrative    Family History  Problem Relation Age of Onset  . Diverticulitis    . Heart attack    . Heart disease Father 62    Heart disease before age 10  . Heart attack Father   . Hypertension Father   . Hyperlipidemia Father   . Varicose Veins Father   . Stroke Father     ? not sure  . Colon cancer Maternal Aunt   . Colon cancer Maternal Uncle     Review of Systems:  As stated in the HPI and otherwise negative.   BP 128/68 mmHg  Pulse 69  Ht 6' (1.829 m)  Wt 213 lb 6.4 oz (96.798 kg)  BMI 28.94 kg/m2  Physical Examination: General: Well developed, well nourished, NAD HEENT: OP clear, mucus membranes moist SKIN: warm, dry. No rashes. Neuro: No focal deficits Musculoskeletal: Muscle strength 5/5 all ext Psychiatric: Mood and affect normal Neck: No JVD, no carotid bruits, no thyromegaly, no lymphadenopathy. Lungs:Clear bilaterally, no wheezes, rhonci, crackles Cardiovascular: Regular rate and rhythm. No murmurs, gallops or rubs. Abdomen:Soft. Bowel sounds present. Non-tender.  Extremities: No lower extremity edema. Pulses are 2 + in the bilateral DP/PT.  Echo 06/17/14: Left ventricle: The cavity size was normal. Systolic function was severely reduced. The estimated ejection fraction was in the range of 25% to 30%. Diffuse hypokinesis. There  is hypokinesis of the inferoseptal and apical myocardium. Features are consistent with a pseudonormal left ventricular filling pattern, with concomitant abnormal relaxation and increased filling pressure (grade 2 diastolic dysfunction). - Mitral valve: Moderately calcified annulus. - Left atrium: The atrium was mildly dilated. - Right ventricle: The cavity size  was mildly dilated. Wall thickness was normal. - Right atrium: The atrium was mildly dilated. - Pulmonary arteries: Systolic pressure was moderately increased. PA peak pressure: 51 mm Hg (S). Impressions: - EF is reduced when compared to prior echocardiogram.  Cardiac cath 06/24/14: Hemodynamic Findings: Central aortic pressure: 124/62 Left ventricular pressure: 130/3/9 Angiographic Findings: Left main: 30% mid stenosis.  Left Anterior Descending Artery: Large caliber diffusely diseased vessel that courses to the apex. The proximal vessel has a 95% stenosis. There is a small caliber diagonal branch that is supplied by the proximal LAD.The mid LAD is occluded. The mid and distal LAD fills from the patent IMA graft. There is a moderate caliber septal perforating branch.  Circumflex Artery: Moderate caliber vessel with proximal 80% stenosis, 100% mid occlusion. The obtuse marginal branch is occluded and fills from the patent vein graft.  Right Coronary Artery: Large dominant vessel with diffuse 30% proximal stenosis, 95% mid stenosis, diffuse 30% distal stenosis. There is diffuse 90% distal stenosis just before the bifurcation. The PLA and PDA fill antegrade and from the patent sequential vein grafts.  Graft Anatomy:  SVG to OM is patent. There are two valves in the body of the vein graft and the appearance of this graft is unchanged from last cath in 2010.  SVG sequential to PLA/PDA is patent LIMA to mid LAD is patent. The target LAD vessel is small and diffusely diseased.  Left Ventricular Angiogram: Deferred.    EKG:  EKG is ordered today. The ekg ordered today demonstrates Atrial fibrillation, rate 69 bpm.   Recent Labs: 06/25/2014: BUN 26*; Creatinine 1.32; Hemoglobin 12.5*; Platelets 165; Potassium 4.0; Sodium 139   Lipid Panel No results found for: CHOL, TRIG, HDL, CHOLHDL, VLDL, LDLCALC, LDLDIRECT   Wt Readings from Last 3 Encounters:  07/08/14 213 lb 6.4 oz (96.798 kg)  06/21/14 209 lb (94.802 kg)  06/07/14 217 lb 12.8 oz (98.793 kg)    Other studies Reviewed: Additional studies/ records that were reviewed today include: . Review of the above records demonstrates:  Assessment and Plan:   1. Coronary artery disease: Stable by recent cath 06/24/14. No chest pain. Continue ASA, statin, beta blocker, Ace-inh.   2. Hyperlipidemia:  He is on a statin. Lipids followed in primary care.   3. Carotid artery disease: Followed in [facility] and stable by dopplers January 2016.   4. HTN: BP controlled. No changes today.   5. Non-ischemic Cardiomyopathy: LVEF is reduced as above. Prior to his bypass in 2007 his LVEF was around 30% but improved to 60% post bypass. Now LVEF is 30%. Continue medical management.   6. Atrial fibrillation: He is now anti-coagulated on Eliquis (started June 25, 2014). Rate control with Toprol. Feeling well overall but no energy and some dyspnea. Willl start amiodarone 200 mg daily and see back in one month. If still in atrial fib, will plan DCCV then.   Current medicines are reviewed at length with the patient today.  The patient does not have concerns regarding medicines.  The following changes have been made:  Amiodarone is added.   Labs/ tests ordered today include:   Orders Placed This Encounter  Procedures  . EKG 12-Lead    Disposition:   FU with me in 4 weeks  Signed, [name], MD 07/08/2014 3:16 PM    [facility] [place], [place], [place]  [address] Phone: [phone]; Fax: [phone]

## 2014-07-08 NOTE — Patient Instructions (Signed)
Medication Instructions:  Your physician has recommended you make the following change in your medication: Start Amiodarone 200 mg by mouth daily   Labwork: none  Testing/Procedures: none  Follow-Up: Your physician recommends that you schedule a follow-up appointment in: one month. Scheduled for Aug 13, 2014 at 11:00   Any Other Special Instructions Will Be Listed Below (If Applicable).

## 2014-07-12 DIAGNOSIS — E11359 Type 2 diabetes mellitus with proliferative diabetic retinopathy without macular edema: Secondary | ICD-10-CM | POA: Diagnosis not present

## 2014-07-12 DIAGNOSIS — H3582 Retinal ischemia: Secondary | ICD-10-CM | POA: Diagnosis not present

## 2014-07-19 ENCOUNTER — Ambulatory Visit: Payer: Medicare HMO | Admitting: Physician Assistant

## 2014-07-20 DIAGNOSIS — E11359 Type 2 diabetes mellitus with proliferative diabetic retinopathy without macular edema: Secondary | ICD-10-CM | POA: Diagnosis not present

## 2014-07-20 DIAGNOSIS — H211X3 Other vascular disorders of iris and ciliary body, bilateral: Secondary | ICD-10-CM | POA: Diagnosis not present

## 2014-08-13 ENCOUNTER — Encounter: Payer: Self-pay | Admitting: Cardiovascular Disease

## 2014-08-13 ENCOUNTER — Ambulatory Visit (INDEPENDENT_AMBULATORY_CARE_PROVIDER_SITE_OTHER): Payer: Commercial Managed Care - HMO | Admitting: Cardiovascular Disease

## 2014-08-13 VITALS — BP 112/58 | HR 53 | Ht 72.0 in | Wt 213.8 lb

## 2014-08-13 DIAGNOSIS — I4891 Unspecified atrial fibrillation: Secondary | ICD-10-CM

## 2014-08-13 DIAGNOSIS — I420 Dilated cardiomyopathy: Secondary | ICD-10-CM

## 2014-08-13 DIAGNOSIS — I10 Essential (primary) hypertension: Secondary | ICD-10-CM

## 2014-08-13 DIAGNOSIS — I2510 Atherosclerotic heart disease of native coronary artery without angina pectoris: Secondary | ICD-10-CM

## 2014-08-13 DIAGNOSIS — I6523 Occlusion and stenosis of bilateral carotid arteries: Secondary | ICD-10-CM

## 2014-08-13 MED ORDER — METOPROLOL SUCCINATE ER 25 MG PO TB24: 25.0000 mg | ORAL_TABLET | Freq: Every day | ORAL | Status: DC

## 2014-08-13 MED ORDER — AMIODARONE HCL 200 MG PO TABS: 200.0000 mg | ORAL_TABLET | Freq: Every day | ORAL | Status: DC

## 2014-08-13 MED ORDER — APIXABAN 5 MG PO TABS: 5.0000 mg | ORAL_TABLET | Freq: Two times a day (BID) | ORAL | Status: DC

## 2014-08-13 NOTE — Progress Notes (Signed)
No chief complaint on file.    History of Present Illness: 79 yo male with history of DM, HTN, HLD, CAD s/p 4V CABG in 2007, carotid artery disease here today for follow up. He has been followed by Dr. Lia Foyer. He underwent 4V CABG 2007 (LIMA to LAD, SVG to OM, SVG sequential to PLA/PDA). Last cath February 2010 with 4 patent grafts, severe native vessel disease. He is known to have mild pulmonary artery HTN. Carotid artery disease followed in [facility] by Dr. Kellie Simmering. Last carotid doppler 04/06/14 with 60-79% RICA stenosis, less than [id number] LICA stenosis. I saw him 06/07/14 and he was doing well. I arranged an echo on 06/17/14 which showed LVEF=25-30%, akinesis of the inferoseptum and apex. No significant valve disease. I arranged a cardiac cath on 05/27/14 which showed severe native vessel CAD and 4 patent bypass grafts. He was noted to be in atrial fibrillation the day of the cardiac cath. He was started on Eliquis 5 mg po BID. Toprol was increased to 50 mg daily.   He is here today for follow up. He is feeling well overall. No palpitations or chest pain. He has some fatigue. This seems to be worse over the last 3 months. His wife thinks his memory issues are worse after cath. No PND, orthopnea, LE edema.   Primary Care Physician: Shirline Frees  Last Lipid Profile: Followed in primary care.   Past Medical History  Diagnosis Date  . DIABETES MELLITUS, TYPE II   . HYPERCHOLESTEROLEMIA   . UNSPECIFIED THROMBOCYTOPENIA   . HYPERTENSION   . CAD, ARTERY BYPASS GRAFT   . PULMONARY HYPERTENSION   . CAROTID ARTERY STENOSIS   . RENAL INSUFFICIENCY, CHRONIC   . ANEMIA, HX OF   . Myocardial infarction June 2007  . Cataract   . Diverticulosis   . Diabetic retinal damage of right eye 2015  . Bronchial pneumonia Jan. 2016  . New onset atrial fibrillation 06/24/2014    Past Surgical History  Procedure Laterality Date  . Coronary artery bypass graft       quad bypass/2007  . Tonsillectomy    . Colonoscopy     . Polypectomy    . Cataract extraction  2012    right eye  . Eye surgery    . Cardiac catheterization  06/24/2014  . Left heart catheterization with coronary/graft angiogram [name] 06/24/2014    Procedure: LEFT HEART CATHETERIZATION WITH Beatrix Fetters;  Surgeon: Burnell Blanks, MD;  Location: [facility] CATH LAB;  Service: Cardiovascular;  Laterality: [name];    Current Outpatient Prescriptions  Medication Sig Dispense Refill  . ACTOS 45 MG tablet Take 45 mg by mouth daily.     [name] amiodarone (PACERONE) 200 MG tablet Take 1 tablet (200 mg total) by mouth daily. 90 tablet 3  . apixaban (ELIQUIS) 5 MG TABS tablet Take 1 tablet (5 mg total) by mouth 2 (two) times daily. 180 tablet 3  . aspirin 81 MG tablet Take 81 mg by mouth daily.      . calcium-vitamin D (OSCAL WITH D) 500-200 MG-UNIT per tablet Take 1 tablet by mouth daily. Calcium 600 mg    . cholecalciferol (VITAMIN D) 400 UNITS TABS Take 400 Units by mouth daily. VITAMIN D #3    . Coenzyme Q10 (CO Q 10 PO) Take 300 mg by mouth daily.    . fish oil-omega-3 fatty acids 1000 MG capsule Take 1 g by mouth daily.      [name] glipiZIDE (GLUCOTROL) 5 MG  tablet Take 10 mg by mouth 2 (two) times daily before a meal.    . losartan (COZAAR) 50 MG tablet Take 1 tablet (50 mg total) by mouth daily. 90 tablet 3  . metFORMIN (GLUCOPHAGE) 500 MG tablet Take 2 tablets (1,000 mg total) by mouth 2 (two) times daily with a meal.    . metoprolol succinate (TOPROL-XL) 25 MG 24 hr tablet Take 1 tablet (25 mg total) by mouth daily. 90 tablet 3  . Multiple Vitamin (MULTI-DAY VITAMINS PO) Take 1 tablet by mouth daily. 1 tab daily    . omeprazole (PRILOSEC) 20 MG capsule Take 20 mg by mouth 2 (two) times daily before a meal.     . simvastatin (ZOCOR) 20 MG tablet Take 20 mg by mouth daily at 6 PM.     . Turmeric 450 MG CAPS Take 1,350 mg by mouth daily.    . vitamin C (ASCORBIC ACID) 500 MG tablet Take 500 mg by mouth daily.      . vitamin E 1000 UNIT capsule  Take 1,000 Units by mouth every other day.     No current facility-administered medications for this visit.    No Known Allergies  History   Social History  . Marital Status: Married    Spouse Name: [name]  . Number of Children: 2  . Years of Education: [name]   Occupational History  . Retired-sales    Social History Main Topics  . Smoking status: Former Smoker    Types: Cigarettes    Quit date: 03/26/1970  . Smokeless tobacco: Never Used  . Alcohol Use: 0.6 oz/week    1 Glasses of wine per week  . Drug Use: No  . Sexual Activity: Not on file   Other Topics Concern  . Not on file   Social History Narrative    Family History  Problem Relation Age of Onset  . Diverticulitis    . Heart attack    . Heart disease Father 102    Heart disease before age 49  . Heart attack Father   . Hypertension Father   . Hyperlipidemia Father   . Varicose Veins Father   . Stroke Father     ? not sure  . Colon cancer Maternal Aunt   . Colon cancer Maternal Uncle     Review of Systems:  As stated in the HPI and otherwise negative.   BP 112/58 mmHg  Pulse 53  Ht 6' (1.829 m)  Wt 213 lb 12.8 oz (96.979 kg)  BMI 28.99 kg/m2  Physical Examination: General: Well developed, well nourished, NAD HEENT: OP clear, mucus membranes moist SKIN: warm, dry. No rashes. Neuro: No focal deficits Musculoskeletal: Muscle strength 5/5 all ext Psychiatric: Mood and affect normal Neck: No JVD, no carotid bruits, no thyromegaly, no lymphadenopathy. Lungs:Clear bilaterally, no wheezes, rhonci, crackles Cardiovascular: Regular rate and rhythm. No murmurs, gallops or rubs. Abdomen:Soft. Bowel sounds present. Non-tender.  Extremities: No lower extremity edema. Pulses are 2 + in the bilateral DP/PT.  Echo 06/17/14: Left ventricle: The cavity size was normal. Systolic function was severely reduced. The estimated ejection fraction was in the range of 25% to 30%. Diffuse hypokinesis. There is  hypokinesis of the inferoseptal and apical myocardium. Features are consistent with a pseudonormal left ventricular filling pattern, with concomitant abnormal relaxation and increased filling pressure (grade 2 diastolic dysfunction). - Mitral valve: Moderately calcified annulus. - Left atrium: The atrium was mildly dilated. - Right ventricle: The cavity size was mildly dilated.  Wall thickness was normal. - Right atrium: The atrium was mildly dilated. - Pulmonary arteries: Systolic pressure was moderately increased. PA peak pressure: 51 mm Hg (S). Impressions: - EF is reduced when compared to prior echocardiogram.  Cardiac cath 06/24/14: Hemodynamic Findings: Central aortic pressure: 124/62 Left ventricular pressure: 130/3/9 Angiographic Findings: Left main: 30% mid stenosis.  Left Anterior Descending Artery: Large caliber diffusely diseased vessel that courses to the apex. The proximal vessel has a 95% stenosis. There is a small caliber diagonal branch that is supplied by the proximal LAD.The mid LAD is occluded. The mid and distal LAD fills from the patent IMA graft. There is a moderate caliber septal perforating branch.  Circumflex Artery: Moderate caliber vessel with proximal 80% stenosis, 100% mid occlusion. The obtuse marginal branch is occluded and fills from the patent vein graft.  Right Coronary Artery: Large dominant vessel with diffuse 30% proximal stenosis, 95% mid stenosis, diffuse 30% distal stenosis. There is diffuse 90% distal stenosis just before the bifurcation. The PLA and PDA fill antegrade and from the patent sequential vein grafts.  Graft Anatomy:  SVG to OM is patent. There are two valves in the body of the vein graft and the appearance of this graft is unchanged from last cath in 2010.  SVG sequential to PLA/PDA is patent LIMA to mid LAD is patent. The target LAD vessel is small and diffusely diseased.  Left Ventricular Angiogram: Deferred.   EKG:   EKG is ordered today. The ekg ordered today demonstrates sinus 1st degree AV block.    Recent Labs: 06/25/2014: BUN 26*; Creatinine 1.32; Hemoglobin 12.5*; Platelets 165; Potassium 4.0; Sodium 139   Lipid Panel No results found for: CHOL, TRIG, HDL, CHOLHDL, VLDL, LDLCALC, LDLDIRECT   Wt Readings from Last 3 Encounters:  08/13/14 213 lb 12.8 oz (96.979 kg)  07/08/14 213 lb 6.4 oz (96.798 kg)  06/21/14 209 lb (94.802 kg)    Other studies Reviewed: Additional studies/ records that were reviewed today include: . Review of the above records demonstrates:  Assessment and Plan:   1. Coronary artery disease: Stable by recent cath 06/24/14. No chest pain. Continue ASA, statin, beta blocker, Ace-inh.   2. Hyperlipidemia:  He is on a statin. Lipids followed in primary care.   3. Carotid artery disease: Followed in [facility] and stable by dopplers January 2016.   4. HTN: BP controlled. No changes today.   5. Non-ischemic Cardiomyopathy: LVEF is reduced as above. Prior to his bypass in 2007 his LVEF was around 30% but improved to 60% post bypass. Now LVEF is 30%. Continue medical management.   6. Atrial fibrillation: He is now anti-coagulated on Eliquis (started June 25, 2014). Rate control with Toprol. Feeling well overall but no energy. He was started on amiodarone 200 mg daily at his last visit here 07/08/14. Since he is in sinus, will lower Toprol to 25 mg po Qdaily. Continue amiodarone.    Current medicines are reviewed at length with the patient today.  The patient does not have concerns regarding medicines.  The following changes have been made:  Amiodarone is added.   Labs/ tests ordered today include:   Orders Placed This Encounter  Procedures  . EKG 12-Lead    Disposition:   FU with me in 3 months.   Signed, [name], MD 08/13/2014 1:17 PM    [facility] [address], [place], [place]  [address] Phone: [phone]; Fax: [phone]

## 2014-08-13 NOTE — Patient Instructions (Addendum)
Medication Instructions:  Your physician has recommended you make the following change in your medication:  Decrease Toprol to 25 mg by mouth daily.    Labwork: none  Testing/Procedures: none  Follow-Up: Your physician recommends that you schedule a follow-up appointment in: 3 months with Dr. Azucena Freed for November 22, 2014 at 8:45

## 2014-08-27 ENCOUNTER — Telehealth: Payer: Self-pay | Admitting: *Deleted

## 2014-08-27 NOTE — Telephone Encounter (Signed)
Pt dropped off list of med corrections. He is taking glipizide 10 mg by mouth twice daily, Vitamin C 1000 mg daily, Vitamin D3 2000 iu daily. Will update list

## 2014-09-14 DIAGNOSIS — E11359 Type 2 diabetes mellitus with proliferative diabetic retinopathy without macular edema: Secondary | ICD-10-CM | POA: Diagnosis not present

## 2014-09-14 DIAGNOSIS — H211X3 Other vascular disorders of iris and ciliary body, bilateral: Secondary | ICD-10-CM | POA: Diagnosis not present

## 2014-09-20 ENCOUNTER — Other Ambulatory Visit: Payer: Self-pay

## 2014-10-04 DIAGNOSIS — I480 Paroxysmal atrial fibrillation: Secondary | ICD-10-CM | POA: Diagnosis not present

## 2014-10-04 DIAGNOSIS — E119 Type 2 diabetes mellitus without complications: Secondary | ICD-10-CM | POA: Diagnosis not present

## 2014-10-04 DIAGNOSIS — I6523 Occlusion and stenosis of bilateral carotid arteries: Secondary | ICD-10-CM | POA: Diagnosis not present

## 2014-10-04 DIAGNOSIS — I2510 Atherosclerotic heart disease of native coronary artery without angina pectoris: Secondary | ICD-10-CM | POA: Diagnosis not present

## 2014-10-04 DIAGNOSIS — R600 Localized edema: Secondary | ICD-10-CM | POA: Diagnosis not present

## 2014-10-04 DIAGNOSIS — N183 Chronic kidney disease, stage 3 (moderate): Secondary | ICD-10-CM | POA: Diagnosis not present

## 2014-10-04 DIAGNOSIS — I10 Essential (primary) hypertension: Secondary | ICD-10-CM | POA: Diagnosis not present

## 2014-10-04 DIAGNOSIS — H211X3 Other vascular disorders of iris and ciliary body, bilateral: Secondary | ICD-10-CM | POA: Diagnosis not present

## 2014-10-04 DIAGNOSIS — E782 Mixed hyperlipidemia: Secondary | ICD-10-CM | POA: Diagnosis not present

## 2014-10-08 ENCOUNTER — Encounter: Payer: Self-pay | Admitting: Family

## 2014-10-12 ENCOUNTER — Ambulatory Visit (HOSPITAL_COMMUNITY)
Admission: RE | Admit: 2014-10-12 | Discharge: 2014-10-12 | Disposition: A | Discharge status: Home / Self Care | Payer: Commercial Managed Care - HMO | Source: Ambulatory Visit | Attending: Family | Admitting: Family

## 2014-10-12 ENCOUNTER — Encounter: Payer: Self-pay | Admitting: Family

## 2014-10-12 ENCOUNTER — Ambulatory Visit (INDEPENDENT_AMBULATORY_CARE_PROVIDER_SITE_OTHER): Payer: Commercial Managed Care - HMO | Admitting: Family

## 2014-10-12 VITALS — BP 131/67 | HR 54 | Ht 72.0 in | Wt 209.1 lb

## 2014-10-12 DIAGNOSIS — I6523 Occlusion and stenosis of bilateral carotid arteries: Secondary | ICD-10-CM | POA: Insufficient documentation

## 2014-10-12 NOTE — Patient Instructions (Signed)
Stroke Prevention Some medical conditions and behaviors are associated with an increased chance of having a stroke. You may prevent a stroke by making healthy choices and managing medical conditions. HOW CAN I REDUCE MY RISK OF HAVING A STROKE?   Stay physically active. Get at least 30 minutes of activity on most or all days.  Do not smoke. It may also be helpful to avoid exposure to secondhand smoke.  Limit alcohol use. Moderate alcohol use is considered to be:  No more than 2 drinks per day for men.  No more than 1 drink per day for nonpregnant women.  Eat healthy foods. This involves:  Eating 5 or more servings of fruits and vegetables a day.  Making dietary changes that address high blood pressure (hypertension), high cholesterol, diabetes, or obesity.  Manage your cholesterol levels.  Making food choices that are high in fiber and low in saturated fat, trans fat, and cholesterol may control cholesterol levels.  Take any prescribed medicines to control cholesterol as directed by your health care provider.  Manage your diabetes.  Controlling your carbohydrate and sugar intake is recommended to manage diabetes.  Take any prescribed medicines to control diabetes as directed by your health care provider.  Control your hypertension.  Making food choices that are low in salt (sodium), saturated fat, trans fat, and cholesterol is recommended to manage hypertension.  Take any prescribed medicines to control hypertension as directed by your health care provider.  Maintain a healthy weight.  Reducing calorie intake and making food choices that are low in sodium, saturated fat, trans fat, and cholesterol are recommended to manage weight.  Stop drug abuse.  Avoid taking birth control pills.  Talk to your health care provider about the risks of taking birth control pills if you are over 35 years old, smoke, get migraines, or have ever had a blood clot.  Get evaluated for sleep  disorders (sleep apnea).  Talk to your health care provider about getting a sleep evaluation if you snore a lot or have excessive sleepiness.  Take medicines only as directed by your health care provider.  For some people, aspirin or blood thinners (anticoagulants) are helpful in reducing the risk of forming abnormal blood clots that can lead to stroke. If you have the irregular heart rhythm of atrial fibrillation, you should be on a blood thinner unless there is a good reason you cannot take them.  Understand all your medicine instructions.  Make sure that other conditions (such as anemia or atherosclerosis) are addressed. SEEK IMMEDIATE MEDICAL CARE IF:   You have sudden weakness or numbness of the face, arm, or leg, especially on one side of the body.  Your face or eyelid droops to one side.  You have sudden confusion.  You have trouble speaking (aphasia) or understanding.  You have sudden trouble seeing in one or both eyes.  You have sudden trouble walking.  You have dizziness.  You have a loss of balance or coordination.  You have a sudden, severe headache with no known cause.  You have new chest pain or an irregular heartbeat. Any of these symptoms may represent a serious problem that is an emergency. Do not wait to see if the symptoms will go away. Get medical help at once. Call your local emergency services (911 in [place] Do not drive yourself to the hospital. Document Released: 04/19/2004 Document Revised: 07/27/2013 Document Reviewed: 09/12/2012 Exit[employer] Patient Information 2015 [employer]. This information is not intended to replace advice given   to you by your health care provider. Make sure you discuss any questions you have with your health care provider.  

## 2014-10-12 NOTE — Progress Notes (Signed)
Established Carotid Patient   History of Present Illness  [name]. is a 79 y.o. male patient of Dr. Donnetta Hutching who presents today for followup of his moderate to severe asymptomatic right internal carotid artery stenosis. He is right-handed. He denies any neurologic deficits specifically no amaurosis fugax, transient ischemic attack or stroke. He reports that he is also stable from a cardiac standpoint. He had his carotid stenosis initially discovered at the time of coronary bypass grafting in 2007 and has had serial ultrasounds since then.  Patient has not had previous carotid artery intervention. He denies tingling, weakness, or pain in either hand or arm, denies dizziness with raising arms above his head.  He denies claudication symptoms in his legs with walking, denies non healing wounds. He works out at a gym 4 days/week.  He has chronic venous insufficiency and wears his knee high compression hose during the day.   Pt reports New Medical or Surgical History: new onset atrial fib March 2016, placed on Eliquis, converted to SR with medications about a month later, remains on Eliquis and amiodarone. He was started on furosemide for LE edema and since developing atrial fib; this has helped his LE edema and his breathing.  He had pneumonia January 2016.  Pt Diabetic: Yes, states his last A1C was less than 6.6  Pt smoker: former smoker, quit in 1972    Past Medical History  Diagnosis Date  . DIABETES MELLITUS, TYPE II   . HYPERCHOLESTEROLEMIA   . UNSPECIFIED THROMBOCYTOPENIA   . HYPERTENSION   . CAD, ARTERY BYPASS GRAFT   . PULMONARY HYPERTENSION   . CAROTID ARTERY STENOSIS   . RENAL INSUFFICIENCY, CHRONIC   . ANEMIA, HX OF   . Myocardial infarction June 2007  . Cataract   . Diverticulosis   . Diabetic retinal damage of right eye 2015  . Bronchial pneumonia Jan. 2016  . New onset atrial fibrillation 06/24/2014    Social History History  Substance Use Topics  .  Smoking status: Former Smoker    Types: Cigarettes    Quit date: 03/26/1970  . Smokeless tobacco: Never Used  . Alcohol Use: 0.6 oz/week    1 Glasses of wine per week    Family History Family History  Problem Relation Age of Onset  . Diverticulitis    . Heart attack    . Heart disease Father 53    Heart disease before age 42  . Heart attack Father   . Hypertension Father   . Hyperlipidemia Father   . Varicose Veins Father   . Stroke Father     ? not sure  . Colon cancer Maternal Aunt   . Colon cancer Maternal Uncle     Surgical History Past Surgical History  Procedure Laterality Date  . Coronary artery bypass graft       quad bypass/2007  . Tonsillectomy    . Colonoscopy    . Polypectomy    . Cataract extraction  2012    right eye  . Eye surgery    . Cardiac catheterization  06/24/2014  . Left heart catheterization with coronary/graft angiogram [name] 06/24/2014    Procedure: LEFT HEART CATHETERIZATION WITH Beatrix Fetters;  Surgeon: Burnell Blanks, MD;  Location: [facility] CATH LAB;  Service: Cardiovascular;  Laterality: [name];    No Known Allergies  Current Outpatient Prescriptions  Medication Sig Dispense Refill  . ACTOS 45 MG tablet Take 45 mg by mouth daily.     [name] amiodarone (PACERONE)  200 MG tablet Take 1 tablet (200 mg total) by mouth daily. (Patient taking differently: Take 100 mg by mouth daily. ) 90 tablet 3  . apixaban (ELIQUIS) 5 MG TABS tablet Take 1 tablet (5 mg total) by mouth 2 (two) times daily. 180 tablet 3  . Ascorbic Acid (VITAMIN C) 1000 MG tablet Take 1,000 mg by mouth daily.    [name] aspirin 81 MG tablet Take 81 mg by mouth daily.      . Bevacizumab (AVASTIN) 100 MG/4ML SOLN Inject into the vein.    . calcium-vitamin D (OSCAL WITH D) 500-200 MG-UNIT per tablet Take 1 tablet by mouth daily. Calcium 600 mg    . Cholecalciferol (VITAMIN D3) 2000 UNITS TABS Take by mouth daily.    . Coenzyme Q10 (CO Q 10 PO) Take 300 mg by mouth daily.    .  fish oil-omega-3 fatty acids 1000 MG capsule Take 1 g by mouth daily.      . furosemide (LASIX) 20 MG tablet Take 20 mg by mouth daily.  0  . glipiZIDE (GLUCOTROL) 10 MG tablet Take 10 mg by mouth 2 (two) times daily before a meal.    . losartan (COZAAR) 50 MG tablet Take 1 tablet (50 mg total) by mouth daily. 90 tablet 3  . metFORMIN (GLUCOPHAGE) 500 MG tablet Take 2 tablets (1,000 mg total) by mouth 2 (two) times daily with a meal.    . metoprolol succinate (TOPROL-XL) 25 MG 24 hr tablet Take 1 tablet (25 mg total) by mouth daily. 90 tablet 3  . Multiple Vitamin (MULTI-DAY VITAMINS PO) Take 1 tablet by mouth daily. 1 tab daily    . omeprazole (PRILOSEC) 20 MG capsule Take 20 mg by mouth 2 (two) times daily before a meal.     . simvastatin (ZOCOR) 20 MG tablet Take 20 mg by mouth daily at 6 PM.     . Turmeric 450 MG CAPS Take 1,350 mg by mouth daily.    . vitamin E 1000 UNIT capsule Take 1,000 Units by mouth every other day.     No current facility-administered medications for this visit.    Review of Systems : See HPI for pertinent positives and negatives.  Physical Examination  Filed Vitals:   10/12/14 1522 10/12/14 1525  BP: 118/59 131/67  Pulse: 53 54  Height: 6' (1.829 m)   Weight: 209 lb 1.6 oz (94.847 kg)   SpO2: 99%    Body mass index is 28.35 kg/(m^2).  General: WDWN male in NAD GAIT: normal Eyes: PERRLA Pulmonary: Non-labored, CTAB, Negative Rales, Negative rhonchi, & Negative wheezing.  Cardiac: regular Rhythm, no detected murmur.  VASCULAR EXAM Carotid Bruits Left Right   Negative Negative   Radial pulses are 2+ palpable and equal.     Gastrointestinal: soft, nontender, BS WNL, no r/g,no palpable masses.  Musculoskeletal: Negative muscle atrophy/wasting. M/S 5/5 throughout, Extremities without ischemic changes. 1+  bilateral pitting edema in both lower legs.   Neurologic: A&O X 3; Appropriate Affect, Speech is normal CN 2-12 intact, Pain and light touch intact in extremities, Motor exam as listed above.         Non-Invasive Vascular Imaging CAROTID DUPLEX 10/12/2014   CEREBROVASCULAR DUPLEX EVALUATION    INDICATION: Carotid artery stenosis    PREVIOUS INTERVENTION(S): NA    DUPLEX EXAM:     RIGHT  LEFT  Peak Systolic Velocities (cm/s) End Diastolic Velocities (cm/s) Plaque LOCATION Peak Systolic Velocities (cm/s) End Diastolic Velocities (cm/s) Plaque  127  18  CCA PROXIMAL 90 17   145 18  CCA MID 94 21   100 18 HT CCA DISTAL 94 20 HT  150 6 HT ECA 112 8 HT  228 43 HT ICA PROXIMAL 106 28 HT  85 23  ICA MID 76 24   77 22  ICA DISTAL 74 21     2.2 ICA / CCA Ratio (PSV)   Antegrade Vertebral Flow Antegrade   Brachial Systolic Pressure (mmHg)   Triphasic Brachial Artery Waveforms Triphasic    Plaque Morphology:  HM = Homogeneous, HT = Heterogeneous, CP = Calcific Plaque, SP = Smooth Plaque, IP = Irregular Plaque  ADDITIONAL FINDINGS:     IMPRESSION: Right internal carotid artery stenosis present in the 40-59% range. Left internal carotid artery stenosis present in the less than 40% range.    Compared to the previous exam:  Doppler velocities obtained in the right proximal ICA are decreased compared to previous exam leading to a decrease in the category of stenosis.      Assessment: [name]. is a 79 y.o. male who has no history of stroke or TIA.  Today's carotid Duplex suggests 40-59% right ICA stenosis and minimal left ICA stenosis. Doppler velocities obtained in the right proximal ICA are decreased compared to previous exam leading to a decrease in the category of stenosis.     Plan: Follow-up in 6 months with Carotid Duplex.   I discussed in depth with the patient the nature of atherosclerosis, and emphasized the importance of maximal medical management including  strict control of blood pressure, blood glucose, and lipid levels, obtaining regular exercise, and continued cessation of smoking.  The patient is aware that without maximal medical management the underlying atherosclerotic disease process will progress, limiting the benefit of any interventions. The patient was given information about stroke prevention and what symptoms should prompt the patient to seek immediate medical care. Thank you for allowing [place] to participate in this patient's care.  Clemon Chambers, RN, MSN, FNP-C [facility] Office: [facility] Physician: Early  10/12/2014 3:57 PM

## 2014-10-13 NOTE — Addendum Note (Signed)
Addended by: Dorthula Rue L on: 10/13/2014 05:06 PM   Modules accepted: Orders

## 2014-10-26 DIAGNOSIS — H2512 Age-related nuclear cataract, left eye: Secondary | ICD-10-CM | POA: Diagnosis not present

## 2014-10-26 DIAGNOSIS — H538 Other visual disturbances: Secondary | ICD-10-CM | POA: Diagnosis not present

## 2014-10-26 DIAGNOSIS — E119 Type 2 diabetes mellitus without complications: Secondary | ICD-10-CM | POA: Diagnosis not present

## 2014-11-02 DIAGNOSIS — E11359 Type 2 diabetes mellitus with proliferative diabetic retinopathy without macular edema: Secondary | ICD-10-CM | POA: Diagnosis not present

## 2014-11-02 DIAGNOSIS — H211X3 Other vascular disorders of iris and ciliary body, bilateral: Secondary | ICD-10-CM | POA: Diagnosis not present

## 2014-11-10 ENCOUNTER — Encounter: Payer: Self-pay | Admitting: Internal Medicine

## 2014-11-22 ENCOUNTER — Encounter: Payer: Self-pay | Admitting: Cardiovascular Disease

## 2014-11-22 ENCOUNTER — Ambulatory Visit (INDEPENDENT_AMBULATORY_CARE_PROVIDER_SITE_OTHER): Payer: Commercial Managed Care - HMO | Admitting: Cardiovascular Disease

## 2014-11-22 VITALS — BP 130/50 | HR 45 | Ht 72.0 in | Wt 206.0 lb

## 2014-11-22 DIAGNOSIS — E7800 Pure hypercholesterolemia, unspecified: Secondary | ICD-10-CM

## 2014-11-22 DIAGNOSIS — I2510 Atherosclerotic heart disease of native coronary artery without angina pectoris: Secondary | ICD-10-CM | POA: Diagnosis not present

## 2014-11-22 DIAGNOSIS — I480 Paroxysmal atrial fibrillation: Secondary | ICD-10-CM

## 2014-11-22 DIAGNOSIS — I10 Essential (primary) hypertension: Secondary | ICD-10-CM

## 2014-11-22 DIAGNOSIS — I429 Cardiomyopathy, unspecified: Secondary | ICD-10-CM

## 2014-11-22 DIAGNOSIS — I6523 Occlusion and stenosis of bilateral carotid arteries: Secondary | ICD-10-CM

## 2014-11-22 DIAGNOSIS — I428 Other cardiomyopathies: Secondary | ICD-10-CM

## 2014-11-22 NOTE — Progress Notes (Signed)
Chief Complaint  Patient presents with  . Bleeding/Bruising     History of Present Illness: 79 yo male with history of DM, HTN, HLD, CAD s/p 4V CABG in 2007, carotid artery disease here today for follow up. He has been followed by Dr. Lia Foyer. He underwent 4V CABG 2007 (LIMA to LAD, SVG to OM, SVG sequential to PLA/PDA). Last cath February 2010 with 4 patent grafts, severe native vessel disease. He is known to have mild pulmonary artery HTN. Carotid artery disease followed in [facility] by Dr. Kellie Simmering. Last carotid doppler 04/06/14 with 60-79% RICA stenosis, less than [id number] LICA stenosis. I saw him 06/07/14 and he was doing well. I arranged an echo on 06/17/14 which showed LVEF=25-30%, akinesis of the inferoseptum and apex. No significant valve disease. I arranged a cardiac cath on 05/27/14 which showed severe native vessel CAD and 4 patent bypass grafts. He was noted to be in atrial fibrillation the day of the cardiac cath. He was started on Eliquis 5 mg po BID. He has tolerated Toprol.   He is here today for follow up. He is feeling well overall. No palpitations or chest pain. No PND, orthopnea, LE edema.   Primary Care Physician: Shirline Frees  Last Lipid Profile: Followed in primary care.   Past Medical History  Diagnosis Date  . DIABETES MELLITUS, TYPE II   . HYPERCHOLESTEROLEMIA   . UNSPECIFIED THROMBOCYTOPENIA   . HYPERTENSION   . CAD, ARTERY BYPASS GRAFT   . PULMONARY HYPERTENSION   . CAROTID ARTERY STENOSIS   . RENAL INSUFFICIENCY, CHRONIC   . ANEMIA, HX OF   . Myocardial infarction June 2007  . Cataract   . Diverticulosis   . Diabetic retinal damage of right eye 2015  . Bronchial pneumonia Jan. 2016  . New onset atrial fibrillation 06/24/2014    Past Surgical History  Procedure Laterality Date  . Coronary artery bypass graft       quad bypass/2007  . Tonsillectomy    . Colonoscopy    . Polypectomy    . Cataract extraction  2012    right eye  . Eye surgery    . Cardiac  catheterization  06/24/2014  . Left heart catheterization with coronary/graft angiogram [name] 06/24/2014    Procedure: LEFT HEART CATHETERIZATION WITH Beatrix Fetters;  Surgeon: Burnell Blanks, MD;  Location[facility] CATH LAB;  Service: Cardiovascular;  Laterality: [name];    Current Outpatient Prescriptions  Medication Sig Dispense Refill  . apixaban (ELIQUIS) 5 MG TABS tablet Take 1 tablet (5 mg total) by mouth 2 (two) times daily. 180 tablet 3  . Ascorbic Acid (VITAMIN C) 1000 MG tablet Take 1,000 mg by mouth daily.    [name] aspirin 81 MG tablet Take 81 mg by mouth daily.      . Bevacizumab (AVASTIN) 100 MG/4ML SOLN Inject into the vein.    . calcium-vitamin D (OSCAL WITH D) 500-200 MG-UNIT per tablet Take 1 tablet by mouth daily. Calcium 600 mg    . Cholecalciferol (VITAMIN D3) 2000 UNITS TABS Take by mouth daily.    . Coenzyme Q10 (CO Q 10 PO) Take 300 mg by mouth daily.    . fish oil-omega-3 fatty acids 1000 MG capsule Take 1 g by mouth daily.      . furosemide (LASIX) 20 MG tablet Take 20 mg by mouth daily.  0  . glipiZIDE (GLUCOTROL) 10 MG tablet Take 10 mg by mouth 2 (two) times daily before a meal.    .  losartan (COZAAR) 50 MG tablet Take 1 tablet (50 mg total) by mouth daily. 90 tablet 3  . metFORMIN (GLUCOPHAGE) 500 MG tablet Take 2 tablets (1,000 mg total) by mouth 2 (two) times daily with a meal.    . metoprolol succinate (TOPROL-XL) 25 MG 24 hr tablet Take 1 tablet (25 mg total) by mouth daily. 90 tablet 3  . Multiple Vitamin (MULTI-DAY VITAMINS PO) Take 1 tablet by mouth daily. 1 tab daily    . omeprazole (PRILOSEC) 20 MG capsule Take 20 mg by mouth 2 (two) times daily before a meal.     . simvastatin (ZOCOR) 20 MG tablet Take 20 mg by mouth daily at 6 PM.     . Turmeric 450 MG CAPS Take 1,350 mg by mouth daily.    . vitamin E 1000 UNIT capsule Take 1,000 Units by mouth every other day.     No current facility-administered medications for this visit.    No Known  Allergies  Social History   Social History  . Marital Status: Married    Spouse Name: [name]  . Number of Children: 2  . Years of Education: [name]   Occupational History  . Retired-sales    Social History Main Topics  . Smoking status: Former Smoker    Types: Cigarettes    Quit date: 03/26/1970  . Smokeless tobacco: Never Used  . Alcohol Use: 0.6 oz/week    1 Glasses of wine per week  . Drug Use: No  . Sexual Activity: Not on file   Other Topics Concern  . Not on file   Social History Narrative    Family History  Problem Relation Age of Onset  . Diverticulitis    . Heart attack    . Heart disease Father 34    Heart disease before age 35  . Heart attack Father   . Hypertension Father   . Hyperlipidemia Father   . Varicose Veins Father   . Stroke Father     ? not sure  . Colon cancer Maternal Aunt   . Colon cancer Maternal Uncle     Review of Systems:  As stated in the HPI and otherwise negative.   BP 130/50 mmHg  Pulse 45  Ht 6' (1.829 m)  Wt 206 lb (93.441 kg)  BMI 27.93 kg/m2  SpO2 99%  Physical Examination: General: Well developed, well nourished, NAD HEENT: OP clear, mucus membranes moist SKIN: warm, dry. No rashes. Neuro: No focal deficits Musculoskeletal: Muscle strength 5/5 all ext Psychiatric: Mood and affect normal Neck: No JVD, no carotid bruits, no thyromegaly, no lymphadenopathy. Lungs:Clear bilaterally, no wheezes, rhonci, crackles Cardiovascular: Regular rate and rhythm. No murmurs, gallops or rubs. Abdomen:Soft. Bowel sounds present. Non-tender.  Extremities: No lower extremity edema. Pulses are 2 + in the bilateral DP/PT.  Echo 06/17/14: Left ventricle: The cavity size was normal. Systolic function was severely reduced. The estimated ejection fraction was in the range of 25% to 30%. Diffuse hypokinesis. There is hypokinesis of the inferoseptal and apical myocardium. Features are consistent with a pseudonormal left ventricular  filling pattern, with concomitant abnormal relaxation and increased filling pressure (grade 2 diastolic dysfunction). - Mitral valve: Moderately calcified annulus. - Left atrium: The atrium was mildly dilated. - Right ventricle: The cavity size was mildly dilated. Wall thickness was normal. - Right atrium: The atrium was mildly dilated. - Pulmonary arteries: Systolic pressure was moderately increased. PA peak pressure: 51 mm Hg (S). Impressions: - EF is reduced when compared  to prior echocardiogram.  Cardiac cath 06/24/14: Hemodynamic Findings: Central aortic pressure: 124/62 Left ventricular pressure: 130/3/9 Angiographic Findings: Left main: 30% mid stenosis.  Left Anterior Descending Artery: Large caliber diffusely diseased vessel that courses to the apex. The proximal vessel has a 95% stenosis. There is a small caliber diagonal branch that is supplied by the proximal LAD.The mid LAD is occluded. The mid and distal LAD fills from the patent IMA graft. There is a moderate caliber septal perforating branch.  Circumflex Artery: Moderate caliber vessel with proximal 80% stenosis, 100% mid occlusion. The obtuse marginal branch is occluded and fills from the patent vein graft.  Right Coronary Artery: Large dominant vessel with diffuse 30% proximal stenosis, 95% mid stenosis, diffuse 30% distal stenosis. There is diffuse 90% distal stenosis just before the bifurcation. The PLA and PDA fill antegrade and from the patent sequential vein grafts.  Graft Anatomy:  SVG to OM is patent. There are two valves in the body of the vein graft and the appearance of this graft is unchanged from last cath in 2010.  SVG sequential to PLA/PDA is patent LIMA to mid LAD is patent. The target LAD vessel is small and diffusely diseased.  Left Ventricular Angiogram: Deferred.   EKG:  EKG is not ordered today. The ekg ordered today demonstrates   Recent Labs: 06/25/2014: BUN 26*; Creatinine, Ser 1.32;  Hemoglobin 12.5*; Platelets 165; Potassium 4.0; Sodium 139   Lipid Panel No results found for: CHOL, TRIG, HDL, CHOLHDL, VLDL, LDLCALC, LDLDIRECT   Wt Readings from Last 3 Encounters:  11/22/14 206 lb (93.441 kg)  10/12/14 209 lb 1.6 oz (94.847 kg)  08/13/14 213 lb 12.8 oz (96.979 kg)    Other studies Reviewed: Additional studies/ records that were reviewed today include: . Review of the above records demonstrates:  Assessment and Plan:   1. Coronary artery disease: Stable by cath 06/24/14. No chest pain. Continue ASA, statin, beta blocker, Ace-inh.   2. Hyperlipidemia:  He is on a statin. Lipids followed in primary care.   3. Carotid artery disease: Followed in [facility] and stable by dopplers January 2016.   4. HTN: BP controlled. No changes today.   5. Non-ischemic Cardiomyopathy: LVEF is reduced as above. Prior to his bypass in 2007 his LVEF was around 30% but improved to 60% post bypass. Now LVEF is 30%. Continue medical management.   6. Atrial fibrillation: He is anti-coagulated on Eliquis. Rate control with Toprol. Feeling well overall but no energy. Will stop amiodarone.   Current medicines are reviewed at length with the patient today.  The patient does not have concerns regarding medicines.  The following changes have been made:  Amiodarone is added.   Labs/ tests ordered today include:   No orders of the defined types were placed in this encounter.    Disposition:   FU with me in 6 months.   Signed, [name], MD 11/22/2014 3:48 PM    [facility] [address], [place], [place]  [address] Phone: [phone]; Fax: [phone]

## 2014-11-22 NOTE — Patient Instructions (Signed)
Medication Instructions:   Your physician has recommended you make the following change in your medication: Stop amiodarone    Labwork: none  Testing/Procedures: none  Follow-Up: Your physician wants you to follow-up in: 6 months.  You will receive a reminder letter in the mail two months in advance. If you don't receive a letter, please call our office to schedule the follow-up appointment.

## 2014-12-07 DIAGNOSIS — H211X3 Other vascular disorders of iris and ciliary body, bilateral: Secondary | ICD-10-CM | POA: Diagnosis not present

## 2014-12-07 DIAGNOSIS — H3582 Retinal ischemia: Secondary | ICD-10-CM | POA: Diagnosis not present

## 2014-12-07 DIAGNOSIS — E11359 Type 2 diabetes mellitus with proliferative diabetic retinopathy without macular edema: Secondary | ICD-10-CM | POA: Diagnosis not present

## 2015-01-04 DIAGNOSIS — E782 Mixed hyperlipidemia: Secondary | ICD-10-CM | POA: Diagnosis not present

## 2015-01-04 DIAGNOSIS — N183 Chronic kidney disease, stage 3 (moderate): Secondary | ICD-10-CM | POA: Diagnosis not present

## 2015-01-04 DIAGNOSIS — H6123 Impacted cerumen, bilateral: Secondary | ICD-10-CM | POA: Diagnosis not present

## 2015-01-04 DIAGNOSIS — I480 Paroxysmal atrial fibrillation: Secondary | ICD-10-CM | POA: Diagnosis not present

## 2015-01-04 DIAGNOSIS — Z125 Encounter for screening for malignant neoplasm of prostate: Secondary | ICD-10-CM | POA: Diagnosis not present

## 2015-01-04 DIAGNOSIS — I10 Essential (primary) hypertension: Secondary | ICD-10-CM | POA: Diagnosis not present

## 2015-01-04 DIAGNOSIS — E119 Type 2 diabetes mellitus without complications: Secondary | ICD-10-CM | POA: Diagnosis not present

## 2015-01-07 DIAGNOSIS — H211X3 Other vascular disorders of iris and ciliary body, bilateral: Secondary | ICD-10-CM | POA: Diagnosis not present

## 2015-02-04 DIAGNOSIS — H35371 Puckering of macula, right eye: Secondary | ICD-10-CM | POA: Diagnosis not present

## 2015-02-04 DIAGNOSIS — E113593 Type 2 diabetes mellitus with proliferative diabetic retinopathy without macular edema, bilateral: Secondary | ICD-10-CM | POA: Diagnosis not present

## 2015-02-04 DIAGNOSIS — H211X3 Other vascular disorders of iris and ciliary body, bilateral: Secondary | ICD-10-CM | POA: Diagnosis not present

## 2015-02-04 DIAGNOSIS — H3582 Retinal ischemia: Secondary | ICD-10-CM | POA: Diagnosis not present

## 2015-04-01 DIAGNOSIS — E113593 Type 2 diabetes mellitus with proliferative diabetic retinopathy without macular edema, bilateral: Secondary | ICD-10-CM | POA: Diagnosis not present

## 2015-04-01 DIAGNOSIS — H211X3 Other vascular disorders of iris and ciliary body, bilateral: Secondary | ICD-10-CM | POA: Diagnosis not present

## 2015-04-15 ENCOUNTER — Encounter: Payer: Self-pay | Admitting: Family

## 2015-04-18 ENCOUNTER — Encounter: Payer: Self-pay | Admitting: Family

## 2015-04-19 ENCOUNTER — Other Ambulatory Visit: Payer: Self-pay | Admitting: *Deleted

## 2015-04-19 ENCOUNTER — Telehealth: Payer: Self-pay | Admitting: Cardiovascular Disease

## 2015-04-19 MED ORDER — APIXABAN 5 MG PO TABS: 5.0000 mg | ORAL_TABLET | Freq: Two times a day (BID) | ORAL | Status: DC

## 2015-04-19 NOTE — Telephone Encounter (Signed)
New message       *STAT* If patient is at the pharmacy, call can be transferred to refill team.   1. Which medications need to be refilled? (please list name of each medication and dose if known)  eilquis 5mg  2. Which pharmacy/location (including street and city if local pharmacy) is medication to be sent to? [facility] 3. Do they need a 30 day or 90 day supply? 90 day

## 2015-04-26 ENCOUNTER — Ambulatory Visit (HOSPITAL_COMMUNITY)
Admission: RE | Admit: 2015-04-26 | Discharge: 2015-04-26 | Disposition: A | Discharge status: Home / Self Care | Payer: PPO | Source: Ambulatory Visit | Attending: Family | Admitting: Family

## 2015-04-26 ENCOUNTER — Ambulatory Visit (INDEPENDENT_AMBULATORY_CARE_PROVIDER_SITE_OTHER): Payer: PPO | Admitting: Family

## 2015-04-26 ENCOUNTER — Encounter: Payer: Self-pay | Admitting: Family

## 2015-04-26 VITALS — BP 137/68 | HR 57 | Ht 72.0 in | Wt 208.0 lb

## 2015-04-26 DIAGNOSIS — N189 Chronic kidney disease, unspecified: Secondary | ICD-10-CM | POA: Diagnosis not present

## 2015-04-26 DIAGNOSIS — I6523 Occlusion and stenosis of bilateral carotid arteries: Secondary | ICD-10-CM

## 2015-04-26 DIAGNOSIS — E7800 Pure hypercholesterolemia, unspecified: Secondary | ICD-10-CM | POA: Insufficient documentation

## 2015-04-26 DIAGNOSIS — I129 Hypertensive chronic kidney disease with stage 1 through stage 4 chronic kidney disease, or unspecified chronic kidney disease: Secondary | ICD-10-CM | POA: Insufficient documentation

## 2015-04-26 DIAGNOSIS — E1122 Type 2 diabetes mellitus with diabetic chronic kidney disease: Secondary | ICD-10-CM | POA: Diagnosis not present

## 2015-04-26 NOTE — Patient Instructions (Signed)
Stroke Prevention Some medical conditions and behaviors are associated with an increased chance of having a stroke. You may prevent a stroke by making healthy choices and managing medical conditions. HOW CAN I REDUCE MY RISK OF HAVING A STROKE?   Stay physically active. Get at least 30 minutes of activity on most or all days.  Do not smoke. It may also be helpful to avoid exposure to secondhand smoke.  Limit alcohol use. Moderate alcohol use is considered to be:  No more than 2 drinks per day for men.  No more than 1 drink per day for nonpregnant women.  Eat healthy foods. This involves:  Eating 5 or more servings of fruits and vegetables a day.  Making dietary changes that address high blood pressure (hypertension), high cholesterol, diabetes, or obesity.  Manage your cholesterol levels.  Making food choices that are high in fiber and low in saturated fat, trans fat, and cholesterol may control cholesterol levels.  Take any prescribed medicines to control cholesterol as directed by your health care provider.  Manage your diabetes.  Controlling your carbohydrate and sugar intake is recommended to manage diabetes.  Take any prescribed medicines to control diabetes as directed by your health care provider.  Control your hypertension.  Making food choices that are low in salt (sodium), saturated fat, trans fat, and cholesterol is recommended to manage hypertension.  Ask your health care provider if you need treatment to lower your blood pressure. Take any prescribed medicines to control hypertension as directed by your health care provider.  If you are 18-39 years of age, have your blood pressure checked every 3-5 years. If you are 40 years of age or older, have your blood pressure checked every year.  Maintain a healthy weight.  Reducing calorie intake and making food choices that are low in sodium, saturated fat, trans fat, and cholesterol are recommended to manage  weight.  Stop drug abuse.  Avoid taking birth control pills.  Talk to your health care provider about the risks of taking birth control pills if you are over 35 years old, smoke, get migraines, or have ever had a blood clot.  Get evaluated for sleep disorders (sleep apnea).  Talk to your health care provider about getting a sleep evaluation if you snore a lot or have excessive sleepiness.  Take medicines only as directed by your health care provider.  For some people, aspirin or blood thinners (anticoagulants) are helpful in reducing the risk of forming abnormal blood clots that can lead to stroke. If you have the irregular heart rhythm of atrial fibrillation, you should be on a blood thinner unless there is a good reason you cannot take them.  Understand all your medicine instructions.  Make sure that other conditions (such as anemia or atherosclerosis) are addressed. SEEK IMMEDIATE MEDICAL CARE IF:   You have sudden weakness or numbness of the face, arm, or leg, especially on one side of the body.  Your face or eyelid droops to one side.  You have sudden confusion.  You have trouble speaking (aphasia) or understanding.  You have sudden trouble seeing in one or both eyes.  You have sudden trouble walking.  You have dizziness.  You have a loss of balance or coordination.  You have a sudden, severe headache with no known cause.  You have new chest pain or an irregular heartbeat. Any of these symptoms may represent a serious problem that is an emergency. Do not wait to see if the symptoms will   go away. Get medical help at once. Call your local emergency services (911 in [place] Do not drive yourself to the hospital.   This information is not intended to replace advice given to you by your health care provider. Make sure you discuss any questions you have with your health care provider.   Document Released: 04/19/2004 Document Revised: 04/02/2014 Document Reviewed:  09/12/2012 Elsevier Interactive Patient Education 2016 Elsevier Inc.  

## 2015-04-26 NOTE — Progress Notes (Signed)
Chief Complaint: Extracranial Carotid Artery Stenosis   History of Present Illness  [name]. is a 80 y.o. male patient of Dr. Donnetta Hutching who presents today for followup of his moderate to severe asymptomatic right internal carotid artery stenosis. He is right-handed. He denies any neurologic deficits specifically no amaurosis fugax, transient ischemic attack or stroke. He reports that he is also stable from a cardiac standpoint. He had his carotid stenosis initially discovered at the time of coronary bypass grafting in 2007 and has had serial ultrasounds since then.  Patient has not had previous carotid artery intervention. He denies tingling, weakness, or pain in either hand or arm, denies dizziness with raising arms above his head.  He denies claudication symptoms in his legs with walking, denies non healing wounds. He works out at a gym 4 days/week.  He has not had much swelling in his legs since he lost weight and has been taking furosemide.  Pt reports New Medical or Surgical History: new onset atrial fib March 2016, placed on Eliquis, converted to SR with medications about a month later, remains on Eliquis and amiodarone. He was started on furosemide for LE edema and since developing atrial fib; this has helped his LE edema and his breathing.  He had pneumonia January 2016.  Pt Diabetic: Yes, states his last A1C was less than 6.3 Pt smoker: former smoker, quit in 1972  Past Medical History  Diagnosis Date  . DIABETES MELLITUS, TYPE II   . HYPERCHOLESTEROLEMIA   . UNSPECIFIED THROMBOCYTOPENIA   . HYPERTENSION   . CAD, ARTERY BYPASS GRAFT   . PULMONARY HYPERTENSION   . CAROTID ARTERY STENOSIS   . RENAL INSUFFICIENCY, CHRONIC   . ANEMIA, HX OF   . Myocardial infarction [facility]) June 2007  . Cataract   . Diverticulosis   . Diabetic retinal damage of right eye 2015  . Bronchial pneumonia Jan. 2016  . New onset atrial fibrillation 06/24/2014    Social History Social  History  Substance Use Topics  . Smoking status: Former Smoker    Types: Cigarettes    Quit date: 03/26/1970  . Smokeless tobacco: Never Used  . Alcohol Use: 0.6 oz/week    1 Glasses of wine per week    Family History Family History  Problem Relation Age of Onset  . Diverticulitis    . Heart attack    . Heart disease Father 38    Heart disease before age 51  . Heart attack Father   . Hypertension Father   . Hyperlipidemia Father   . Varicose Veins Father   . Stroke Father     ? not sure  . Colon cancer Maternal Aunt   . Colon cancer Maternal Uncle     Surgical History Past Surgical History  Procedure Laterality Date  . Coronary artery bypass graft       quad bypass/2007  . Tonsillectomy    . Colonoscopy    . Polypectomy    . Cataract extraction  2012    right eye  . Eye surgery    . Cardiac catheterization  06/24/2014  . Left heart catheterization with coronary/graft angiogram [name] 06/24/2014    Procedure: LEFT HEART CATHETERIZATION WITH Beatrix Fetters;  Surgeon: Burnell Blanks, MD;  Location: [facility] CATH LAB;  Service: Cardiovascular;  Laterality: [name];    No Known Allergies  Current Outpatient Prescriptions  Medication Sig Dispense Refill  . apixaban (ELIQUIS) 5 MG TABS tablet Take 1 tablet (5 mg total)  by mouth 2 (two) times daily. 180 tablet 1  . Ascorbic Acid (VITAMIN C) 1000 MG tablet Take 1,000 mg by mouth daily.    [name] aspirin 81 MG tablet Take 81 mg by mouth daily.      . Bevacizumab (AVASTIN) 100 MG/4ML SOLN Inject into the vein.    . calcium-vitamin D (OSCAL WITH D) 500-200 MG-UNIT per tablet Take 1 tablet by mouth daily. Calcium 600 mg    . Cholecalciferol (VITAMIN D3) 2000 UNITS TABS Take by mouth daily.    . Coenzyme Q10 (CO Q 10 PO) Take 300 mg by mouth daily.    . fish oil-omega-3 fatty acids 1000 MG capsule Take 1 g by mouth daily.      . furosemide (LASIX) 20 MG tablet Take 20 mg by mouth daily.  0  . glipiZIDE (GLUCOTROL) 10 MG  tablet Take 10 mg by mouth 2 (two) times daily before a meal.    . losartan (COZAAR) 50 MG tablet Take 1 tablet (50 mg total) by mouth daily. 90 tablet 3  . metFORMIN (GLUCOPHAGE) 500 MG tablet Take 2 tablets (1,000 mg total) by mouth 2 (two) times daily with a meal.    . metoprolol succinate (TOPROL-XL) 25 MG 24 hr tablet Take 1 tablet (25 mg total) by mouth daily. 90 tablet 3  . Multiple Vitamin (MULTI-DAY VITAMINS PO) Take 1 tablet by mouth daily. 1 tab daily    . omeprazole (PRILOSEC) 20 MG capsule Take 20 mg by mouth 2 (two) times daily before a meal.     . simvastatin (ZOCOR) 20 MG tablet Take 20 mg by mouth daily at 6 PM.     . Turmeric 450 MG CAPS Take 1,350 mg by mouth daily.    . vitamin E 1000 UNIT capsule Take 1,000 Units by mouth every other day.     No current facility-administered medications for this visit.    Review of Systems : See HPI for pertinent positives and negatives.  Physical Examination  Filed Vitals:   04/26/15 1354 04/26/15 1355  BP: 120/68 137/68  Pulse: 57   Height: 6' (1.829 m)   Weight: 208 lb (94.348 kg)   SpO2: 100%    Body mass index is 28.2 kg/(m^2).   General: WDWN male in NAD GAIT: normal Eyes: PERRLA Pulmonary: Non-labored, CTAB, no rales, rhonchi, or wheezing.  Cardiac: regular rhythm, no detected murmur.  VASCULAR EXAM Carotid Bruits Left Right   Negative Negative   Radial pulses are 2+ palpable and equal.     Gastrointestinal: soft, nontender, BS WNL, no r/g,no palpable masses.  Musculoskeletal: No muscle atrophy/wasting. M/S 5/5 throughout, Extremities without ischemic changes. No peripheral edema.   Neurologic: A&O X 3; Appropriate Affect, Speech is normal CN 2-12 intact, Pain and light touch intact in extremities, Motor exam as listed above.              Non-Invasive  Vascular Imaging CAROTID DUPLEX 04/26/2015   CEREBROVASCULAR DUPLEX EVALUATION    INDICATION: Carotid artery disease    PREVIOUS INTERVENTION(S): None    DUPLEX EXAM: Carotid duplex    RIGHT  LEFT  Peak Systolic Velocities (cm/s) End Diastolic Velocities (cm/s) Plaque LOCATION Peak Systolic Velocities (cm/s) End Diastolic Velocities (cm/s) Plaque  103 20  CCA PROXIMAL 113 19 HT  116 19  CCA MID 91 19   116 20 HT CCA DISTAL 99 19 HT  127 10  ECA 90 9   303 66 HT ICA PROXIMAL 110 35  CP  96 18  ICA MID 103 37   92 25  ICA DISTAL 99 34     2.6 ICA / CCA Ratio (PSV) 1.2  Antegrade Vertebral Flow Antegrade  - Brachial Systolic Pressure (mmHg) -  Triphasic Brachial Artery Waveforms Triphasic    Plaque Morphology:  HM = Homogeneous, HT = Heterogeneous, CP = Calcific Plaque, SP = Smooth Plaque, IP = Irregular Plaque     ADDITIONAL FINDINGS: Multiphasic subclavian arteries    IMPRESSION: 1. 60 - 79% right internal carotid artery stenosis 2. Less than 40% left internal carotid artery stenosis.    Compared to the previous exam:  Increased velocity on the right since exam of 10/12/2014      Assessment: [name]. is a 80 y.o. male who has no history of stroke or TIA.  Today's carotid Duplex suggests 60 - 79% right internal carotid artery stenosis and less than 40% left internal carotid artery stenosis. Increased velocity on the right since exam of 10/12/2014 but c/w previous results.   Plan: Follow-up in 6 months with Carotid Duplex scan.   I discussed in depth with the patient the nature of atherosclerosis, and emphasized the importance of maximal medical management including strict control of blood pressure, blood glucose, and lipid levels, obtaining regular exercise, and continued cessation of smoking.  The patient is aware that without maximal medical management the underlying atherosclerotic disease process will progress, limiting the benefit of any interventions. The  patient was given information about stroke prevention and what symptoms should prompt the patient to seek immediate medical care. Thank you for allowing [place] to participate in this patient's care.  Clemon Chambers, RN, MSN, FNP-C [facility] Office: [phone]  Clinic Physician: Early  04/26/2015 1:43 PM

## 2015-04-27 NOTE — Addendum Note (Signed)
Addended by: Thresa Ross C on: 04/27/2015 11:45 AM   Modules accepted: Orders

## 2015-05-17 DIAGNOSIS — H35371 Puckering of macula, right eye: Secondary | ICD-10-CM | POA: Diagnosis not present

## 2015-05-17 DIAGNOSIS — E113593 Type 2 diabetes mellitus with proliferative diabetic retinopathy without macular edema, bilateral: Secondary | ICD-10-CM | POA: Diagnosis not present

## 2015-06-02 ENCOUNTER — Ambulatory Visit (INDEPENDENT_AMBULATORY_CARE_PROVIDER_SITE_OTHER): Payer: PPO | Admitting: Cardiovascular Disease

## 2015-06-02 ENCOUNTER — Encounter: Payer: Self-pay | Admitting: Cardiovascular Disease

## 2015-06-02 VITALS — BP 110/64 | HR 59 | Ht 72.0 in | Wt 205.0 lb

## 2015-06-02 DIAGNOSIS — I480 Paroxysmal atrial fibrillation: Secondary | ICD-10-CM

## 2015-06-02 DIAGNOSIS — I10 Essential (primary) hypertension: Secondary | ICD-10-CM

## 2015-06-02 DIAGNOSIS — I2510 Atherosclerotic heart disease of native coronary artery without angina pectoris: Secondary | ICD-10-CM

## 2015-06-02 DIAGNOSIS — E7800 Pure hypercholesterolemia, unspecified: Secondary | ICD-10-CM | POA: Diagnosis not present

## 2015-06-02 DIAGNOSIS — I429 Cardiomyopathy, unspecified: Secondary | ICD-10-CM | POA: Diagnosis not present

## 2015-06-02 DIAGNOSIS — I428 Other cardiomyopathies: Secondary | ICD-10-CM

## 2015-06-02 NOTE — Progress Notes (Signed)
Chief Complaint  Patient presents with  . Follow-up     History of Present Illness: 80 yo male with history of DM, HTN, HLD, CAD s/p 4V CABG in 2007, carotid artery disease here today for follow up. He has been followed by Dr. Lia Foyer. He underwent 4V CABG 2007 (LIMA to LAD, SVG to OM, SVG sequential to PLA/PDA). Last cath February 2010 with 4 patent grafts, severe native vessel disease. He is known to have mild pulmonary artery HTN. Carotid artery disease followed in [facility] by Dr. Kellie Simmering. Last carotid doppler 04/06/14 with 60-79% RICA stenosis, less than [id number] LICA stenosis. I saw him 06/07/14 and he was doing well. I arranged an echo on 06/17/14 which showed LVEF=25-30%, akinesis of the inferoseptum and apex. No significant valve disease. I arranged a cardiac cath on 05/27/14 which showed severe native vessel CAD and 4 patent bypass grafts. He was noted to be in atrial fibrillation the day of the cardiac cath. He was started on Eliquis 5 mg po BID. He has tolerated Toprol.   He is here today for follow up. He is feeling well overall. No palpitations or chest pain. No PND, orthopnea, LE edema.   Primary Care Physician: Shirline Frees, MD  Last Lipid Profile: Followed in primary care.   Past Medical History  Diagnosis Date  . DIABETES MELLITUS, TYPE II   . HYPERCHOLESTEROLEMIA   . UNSPECIFIED THROMBOCYTOPENIA   . HYPERTENSION   . CAD, ARTERY BYPASS GRAFT   . PULMONARY HYPERTENSION   . CAROTID ARTERY STENOSIS   . RENAL INSUFFICIENCY, CHRONIC   . ANEMIA, HX OF   . Myocardial infarction [facility]) June 2007  . Cataract   . Diverticulosis   . Diabetic retinal damage of right eye ([place]) 2015  . Bronchial pneumonia Jan. 2016  . New onset atrial fibrillation ([place]) 06/24/2014    Past Surgical History  Procedure Laterality Date  . Coronary artery bypass graft       quad bypass/2007  . Tonsillectomy    . Colonoscopy    . Polypectomy    . Cataract extraction  2012    right eye  . Eye surgery    .  Cardiac catheterization  06/24/2014  . Left heart catheterization with coronary/graft angiogram [name] 06/24/2014    Procedure: LEFT HEART CATHETERIZATION WITH Beatrix Fetters;  Surgeon: Burnell Blanks, MD;  [facility];  Service: Cardiovascular;  Laterality: [name];    Current Outpatient Prescriptions  Medication Sig Dispense Refill  . apixaban (ELIQUIS) 5 MG TABS tablet Take 1 tablet (5 mg total) by mouth 2 (two) times daily. 180 tablet 1  . Ascorbic Acid (VITAMIN C) 1000 MG tablet Take 500 mg by mouth daily.     [name] aspirin 81 MG tablet Take 81 mg by mouth daily.      . Bevacizumab (AVASTIN) 100 MG/4ML SOLN Inject into the vein. EYE INJECTION    . calcium-vitamin D (OSCAL WITH D) 500-200 MG-UNIT per tablet Take 1 tablet by mouth daily. Reported on 04/26/2015    . Cholecalciferol (VITAMIN D3) 2000 UNITS TABS Take by mouth daily.    . Coenzyme Q10 (CO Q 10 PO) Take 300 mg by mouth daily.    . fish oil-omega-3 fatty acids 1000 MG capsule Take 1 g by mouth daily.      . furosemide (LASIX) 20 MG tablet Take 20 mg by mouth daily.  0  . glipiZIDE (GLUCOTROL) 10 MG tablet Take 10 mg by mouth 2 (two) times  daily before a meal.    . losartan (COZAAR) 50 MG tablet Take 1 tablet (50 mg total) by mouth daily. 90 tablet 3  . metFORMIN (GLUCOPHAGE) 500 MG tablet Take 2 tablets (1,000 mg total) by mouth 2 (two) times daily with a meal.    . metoprolol succinate (TOPROL-XL) 25 MG 24 hr tablet Take 1 tablet (25 mg total) by mouth daily. 90 tablet 3  . Multiple Vitamin (MULTI-DAY VITAMINS PO) Take 1 tablet by mouth daily. 1 tab daily    . omeprazole (PRILOSEC) 20 MG capsule Take 20 mg by mouth 2 (two) times daily before a meal.     . simvastatin (ZOCOR) 20 MG tablet Take 20 mg by mouth daily at 6 PM.     . Turmeric 450 MG CAPS Take 1,350 mg by mouth daily.    . vitamin E 1000 UNIT capsule Take 1,000 Units by mouth every other day.     No current facility-administered medications for this  visit.    No Known Allergies  Social History   Social History  . Marital Status: Married    Spouse Name: [name]  . Number of Children: 2  . Years of Education: [name]   Occupational History  . Retired-sales    Social History Main Topics  . Smoking status: Former Smoker    Types: Cigarettes    Quit date: 03/26/1970  . Smokeless tobacco: Never Used  . Alcohol Use: 0.6 oz/week    1 Glasses of wine per week  . Drug Use: No  . Sexual Activity: Not on file   Other Topics Concern  . Not on file   Social History Narrative    Family History  Problem Relation Age of Onset  . Diverticulitis    . Heart attack    . Heart disease Father 40    Heart disease before age 14  . Heart attack Father   . Hypertension Father   . Hyperlipidemia Father   . Varicose Veins Father   . Stroke Father     ? not sure  . Colon cancer Maternal Aunt   . Colon cancer Maternal Uncle     Review of Systems:  As stated in the HPI and otherwise negative.   BP 110/64 mmHg  Pulse 59  Ht 6' (1.829 m)  Wt 205 lb (92.987 kg)  BMI 27.80 kg/m2  Physical Examination: General: Well developed, well nourished, NAD HEENT: OP clear, mucus membranes moist SKIN: warm, dry. No rashes. Neuro: No focal deficits Musculoskeletal: Muscle strength 5/5 all ext Psychiatric: Mood and affect normal Neck: No JVD, no carotid bruits, no thyromegaly, no lymphadenopathy. Lungs:Clear bilaterally, no wheezes, rhonci, crackles Cardiovascular: Regular rate and rhythm. No murmurs, gallops or rubs. Abdomen:Soft. Bowel sounds present. Non-tender.  Extremities: No lower extremity edema. Pulses are 2 + in the bilateral DP/PT.  Echo 06/17/14: Left ventricle: The cavity size was normal. Systolic function was severely reduced. The estimated ejection fraction was in the range of 25% to 30%. Diffuse hypokinesis. There is hypokinesis of the inferoseptal and apical myocardium. Features are consistent with a pseudonormal left  ventricular filling pattern, with concomitant abnormal relaxation and increased filling pressure (grade 2 diastolic dysfunction). - Mitral valve: Moderately calcified annulus. - Left atrium: The atrium was mildly dilated. - Right ventricle: The cavity size was mildly dilated. Wall thickness was normal. - Right atrium: The atrium was mildly dilated. - Pulmonary arteries: Systolic pressure was moderately increased. PA peak pressure: 51 mm Hg (S). Impressions: -  EF is reduced when compared to prior echocardiogram.  Cardiac cath 06/24/14: Hemodynamic Findings: Central aortic pressure: 124/62 Left ventricular pressure: 130/3/9 Angiographic Findings: Left main: 30% mid stenosis.  Left Anterior Descending Artery: Large caliber diffusely diseased vessel that courses to the apex. The proximal vessel has a 95% stenosis. There is a small caliber diagonal branch that is supplied by the proximal LAD.The mid LAD is occluded. The mid and distal LAD fills from the patent IMA graft. There is a moderate caliber septal perforating branch.  Circumflex Artery: Moderate caliber vessel with proximal 80% stenosis, 100% mid occlusion. The obtuse marginal branch is occluded and fills from the patent vein graft.  Right Coronary Artery: Large dominant vessel with diffuse 30% proximal stenosis, 95% mid stenosis, diffuse 30% distal stenosis. There is diffuse 90% distal stenosis just before the bifurcation. The PLA and PDA fill antegrade and from the patent sequential vein grafts.  Graft Anatomy:  SVG to OM is patent. There are two valves in the body of the vein graft and the appearance of this graft is unchanged from last cath in 2010.  SVG sequential to PLA/PDA is patent LIMA to mid LAD is patent. The target LAD vessel is small and diffusely diseased.  Left Ventricular Angiogram: Deferred.   EKG:  EKG is ordered today. The ekg ordered today demonstrates Sinus brady, rate 59 bpm. 1st degree AV block.    Recent Labs: 06/25/2014: BUN 26*; Creatinine, Ser 1.32; Hemoglobin 12.5*; Platelets 165; Potassium 4.0; Sodium 139   Lipid Panel  Followed in primary care   Wt Readings from Last 3 Encounters:  06/02/15 205 lb (92.987 kg)  04/26/15 208 lb (94.348 kg)  11/22/14 206 lb (93.441 kg)    Other studies Reviewed: Additional studies/ records that were reviewed today include: . Review of the above records demonstrates:  Assessment and Plan:   1. Coronary artery disease: Stable by cath 06/24/14. No chest pain. Continue ASA, statin, beta blocker, Ace-inh.   2. Hyperlipidemia:  He is on a statin. Lipids followed in primary care.   3. Carotid artery disease: Followed in [facility] and stable by dopplers January 2017.   4. HTN: BP controlled. No changes today.   5. Non-ischemic Cardiomyopathy: LVEF is reduced as above. Prior to his bypass in 2007 his LVEF was around 30% but improved to 60% post bypass. Now LVEF is 30%. Continue medical management.   6. Atrial fibrillation: He is anti-coagulated on Eliquis. Rate control with Toprol. Amiodarone stopped due to fatigue.   Current medicines are reviewed at length with the patient today.  The patient does not have concerns regarding medicines.  The following changes have been made:  Amiodarone is added.   Labs/ tests ordered today include:   Orders Placed This Encounter  Procedures  . EKG 12-Lead    Disposition:   FU with me in 6 months.   Signed, [name], MD 06/03/2015 7:02 AM    [facility] [address], [place], [place]  [address] Phone: [phone]; Fax: [phone]

## 2015-06-02 NOTE — Patient Instructions (Signed)

## 2015-07-11 DIAGNOSIS — E1122 Type 2 diabetes mellitus with diabetic chronic kidney disease: Secondary | ICD-10-CM | POA: Diagnosis not present

## 2015-07-11 DIAGNOSIS — E782 Mixed hyperlipidemia: Secondary | ICD-10-CM | POA: Diagnosis not present

## 2015-07-11 DIAGNOSIS — Z7984 Long term (current) use of oral hypoglycemic drugs: Secondary | ICD-10-CM | POA: Diagnosis not present

## 2015-07-11 DIAGNOSIS — I129 Hypertensive chronic kidney disease with stage 1 through stage 4 chronic kidney disease, or unspecified chronic kidney disease: Secondary | ICD-10-CM | POA: Diagnosis not present

## 2015-07-11 DIAGNOSIS — N183 Chronic kidney disease, stage 3 (moderate): Secondary | ICD-10-CM | POA: Diagnosis not present

## 2015-07-11 DIAGNOSIS — R609 Edema, unspecified: Secondary | ICD-10-CM | POA: Diagnosis not present

## 2015-07-11 DIAGNOSIS — K219 Gastro-esophageal reflux disease without esophagitis: Secondary | ICD-10-CM | POA: Diagnosis not present

## 2015-09-06 DIAGNOSIS — H211X1 Other vascular disorders of iris and ciliary body, right eye: Secondary | ICD-10-CM | POA: Diagnosis not present

## 2015-09-06 DIAGNOSIS — E113593 Type 2 diabetes mellitus with proliferative diabetic retinopathy without macular edema, bilateral: Secondary | ICD-10-CM | POA: Diagnosis not present

## 2015-10-07 DIAGNOSIS — H35371 Puckering of macula, right eye: Secondary | ICD-10-CM | POA: Diagnosis not present

## 2015-10-07 DIAGNOSIS — H211X1 Other vascular disorders of iris and ciliary body, right eye: Secondary | ICD-10-CM | POA: Diagnosis not present

## 2015-10-07 DIAGNOSIS — E113593 Type 2 diabetes mellitus with proliferative diabetic retinopathy without macular edema, bilateral: Secondary | ICD-10-CM | POA: Diagnosis not present

## 2015-10-17 ENCOUNTER — Other Ambulatory Visit: Payer: Self-pay | Admitting: Cardiovascular Disease

## 2015-10-21 ENCOUNTER — Encounter: Payer: Self-pay | Admitting: Vascular Surgery

## 2015-10-25 ENCOUNTER — Ambulatory Visit (HOSPITAL_COMMUNITY)
Admission: RE | Admit: 2015-10-25 | Discharge: 2015-10-25 | Disposition: A | Discharge status: Home / Self Care | Payer: PPO | Source: Ambulatory Visit | Attending: Family | Admitting: Family

## 2015-10-25 ENCOUNTER — Encounter: Payer: Self-pay | Admitting: Family

## 2015-10-25 ENCOUNTER — Ambulatory Visit (INDEPENDENT_AMBULATORY_CARE_PROVIDER_SITE_OTHER): Payer: PPO | Admitting: Family

## 2015-10-25 VITALS — BP 123/66 | HR 60 | Temp 98.0°F | Resp 16 | Ht 72.0 in | Wt 212.0 lb

## 2015-10-25 DIAGNOSIS — I272 Other secondary pulmonary hypertension: Secondary | ICD-10-CM | POA: Diagnosis not present

## 2015-10-25 DIAGNOSIS — I872 Venous insufficiency (chronic) (peripheral): Secondary | ICD-10-CM | POA: Diagnosis not present

## 2015-10-25 DIAGNOSIS — I25810 Atherosclerosis of coronary artery bypass graft(s) without angina pectoris: Secondary | ICD-10-CM | POA: Insufficient documentation

## 2015-10-25 DIAGNOSIS — E7800 Pure hypercholesterolemia, unspecified: Secondary | ICD-10-CM | POA: Diagnosis not present

## 2015-10-25 DIAGNOSIS — E119 Type 2 diabetes mellitus without complications: Secondary | ICD-10-CM | POA: Insufficient documentation

## 2015-10-25 DIAGNOSIS — I6523 Occlusion and stenosis of bilateral carotid arteries: Secondary | ICD-10-CM | POA: Insufficient documentation

## 2015-10-25 DIAGNOSIS — Z87891 Personal history of nicotine dependence: Secondary | ICD-10-CM

## 2015-10-25 DIAGNOSIS — I10 Essential (primary) hypertension: Secondary | ICD-10-CM | POA: Diagnosis not present

## 2015-10-25 LAB — VAS US CAROTID
LEFT ECA DIAS: -9 cm/s
LEFT VERTEBRAL DIAS: 10 cm/s
Left CCA dist dias: -22 cm/s
Left CCA dist sys: -119 cm/s
Left CCA prox dias: 18 cm/s
Left CCA prox sys: 119 cm/s
Left ICA dist dias: -33 cm/s
Left ICA dist sys: -101 cm/s
Left ICA prox dias: -28 cm/s
Left ICA prox sys: -121 cm/s
RIGHT CCA MID DIAS: 21 cm/s
RIGHT ECA DIAS: 0 cm/s
Right CCA prox dias: 10 cm/s
Right CCA prox sys: 114 cm/s
Right cca dist sys: -84 cm/s

## 2015-10-25 NOTE — Patient Instructions (Signed)
Stroke Prevention Some medical conditions and behaviors are associated with an increased chance of having a stroke. You may prevent a stroke by making healthy choices and managing medical conditions. HOW CAN I REDUCE MY RISK OF HAVING A STROKE?   Stay physically active. Get at least 30 minutes of activity on most or all days.  Do not smoke. It may also be helpful to avoid exposure to secondhand smoke.  Limit alcohol use. Moderate alcohol use is considered to be:  No more than 2 drinks per day for men.  No more than 1 drink per day for nonpregnant women.  Eat healthy foods. This involves:  Eating 5 or more servings of fruits and vegetables a day.  Making dietary changes that address high blood pressure (hypertension), high cholesterol, diabetes, or obesity.  Manage your cholesterol levels.  Making food choices that are high in fiber and low in saturated fat, trans fat, and cholesterol may control cholesterol levels.  Take any prescribed medicines to control cholesterol as directed by your health care provider.  Manage your diabetes.  Controlling your carbohydrate and sugar intake is recommended to manage diabetes.  Take any prescribed medicines to control diabetes as directed by your health care provider.  Control your hypertension.  Making food choices that are low in salt (sodium), saturated fat, trans fat, and cholesterol is recommended to manage hypertension.  Ask your health care provider if you need treatment to lower your blood pressure. Take any prescribed medicines to control hypertension as directed by your health care provider.  If you are 18-39 years of age, have your blood pressure checked every 3-5 years. If you are 40 years of age or older, have your blood pressure checked every year.  Maintain a healthy weight.  Reducing calorie intake and making food choices that are low in sodium, saturated fat, trans fat, and cholesterol are recommended to manage  weight.  Stop drug abuse.  Avoid taking birth control pills.  Talk to your health care provider about the risks of taking birth control pills if you are over 35 years old, smoke, get migraines, or have ever had a blood clot.  Get evaluated for sleep disorders (sleep apnea).  Talk to your health care provider about getting a sleep evaluation if you snore a lot or have excessive sleepiness.  Take medicines only as directed by your health care provider.  For some people, aspirin or blood thinners (anticoagulants) are helpful in reducing the risk of forming abnormal blood clots that can lead to stroke. If you have the irregular heart rhythm of atrial fibrillation, you should be on a blood thinner unless there is a good reason you cannot take them.  Understand all your medicine instructions.  Make sure that other conditions (such as anemia or atherosclerosis) are addressed. SEEK IMMEDIATE MEDICAL CARE IF:   You have sudden weakness or numbness of the face, arm, or leg, especially on one side of the body.  Your face or eyelid droops to one side.  You have sudden confusion.  You have trouble speaking (aphasia) or understanding.  You have sudden trouble seeing in one or both eyes.  You have sudden trouble walking.  You have dizziness.  You have a loss of balance or coordination.  You have a sudden, severe headache with no known cause.  You have new chest pain or an irregular heartbeat. Any of these symptoms may represent a serious problem that is an emergency. Do not wait to see if the symptoms will   go away. Get medical help at once. Call your local emergency services (911 in [place] Do not drive yourself to the hospital.   This information is not intended to replace advice given to you by your health care provider. Make sure you discuss any questions you have with your health care provider.   Document Released: 04/19/2004 Document Revised: 04/02/2014 Document Reviewed:  09/12/2012 Elsevier Interactive Patient Education 2016 Elsevier Inc.  

## 2015-10-25 NOTE — Progress Notes (Signed)
Chief Complaint: Follow up Extracranial Carotid Artery Stenosis   History of Present Illness  [name] Homewood. is a 80 y.o. male patient of Dr. Donnetta Hutching who presents today for followup of his moderate to severe asymptomatic right internal carotid artery stenosis. He is right-handed. He denies any neurologic deficits specifically no amaurosis fugax, transient ischemic attack or stroke. He reports that he is also stable from a cardiac standpoint. He had his carotid stenosis initially discovered at the time of coronary bypass grafting in 2007 and has had serial ultrasounds since then.  Patient has not had previous carotid artery intervention. He denies tingling, weakness, or pain in either hand or arm, denies dizziness with raising arms above his head.  He denies claudication symptoms in his legs with walking, denies non healing wounds. He works out at a gym 4 days/week.  He has not had much swelling in his legs since he lost weight and has been taking furosemide.  Pt reports New Medical or Surgical History: new onset atrial fib March 2016, placed on Eliquis, converted to SR with medications about a month later, remains on Eliquis. He was started on furosemide for LE edema and since developing atrial fib; this has helped his LE edema and his breathing.  He had pneumonia January 2016.  Pt Diabetic: Yes, states his last A1C was than 6.3 Pt smoker: former smoker, quit in 1972   Past Medical History:  Diagnosis Date  . ANEMIA, HX OF   . Bronchial pneumonia Jan. 2016  . CAD, ARTERY BYPASS GRAFT   . CAROTID ARTERY STENOSIS   . Cataract   . DIABETES MELLITUS, TYPE II   . Diabetic retinal damage of right eye ([place]) 2015  . Diverticulosis   . HYPERCHOLESTEROLEMIA   . HYPERTENSION   . Myocardial infarction [facility]) June 2007  . New onset atrial fibrillation ([place]) 06/24/2014  . PULMONARY HYPERTENSION   . RENAL INSUFFICIENCY, CHRONIC   . UNSPECIFIED THROMBOCYTOPENIA     Social  History Social History  Substance Use Topics  . Smoking status: Former Smoker    Types: Cigarettes    Quit date: 03/26/1970  . Smokeless tobacco: Never Used  . Alcohol use 0.6 oz/week    1 Glasses of wine per week    Family History Family History  Problem Relation Age of Onset  . Diverticulitis    . Heart attack    . Heart disease Father 59    Heart disease before age 81  . Heart attack Father   . Hypertension Father   . Hyperlipidemia Father   . Varicose Veins Father   . Stroke Father     ? not sure  . Colon cancer Maternal Aunt   . Colon cancer Maternal Uncle     Surgical History Past Surgical History:  Procedure Laterality Date  . CARDIAC CATHETERIZATION  06/24/2014  . CATARACT EXTRACTION  2012   right eye  . COLONOSCOPY    . CORONARY ARTERY BYPASS GRAFT      quad bypass/2007  . EYE SURGERY    . LEFT HEART CATHETERIZATION WITH CORONARY/GRAFT ANGIOGRAM [name] 06/24/2014   Procedure: LEFT HEART CATHETERIZATION WITH Beatrix Fetters;  Surgeon: Burnell Blanks, MD;  Location: [facility] CATH LAB;  Service: Cardiovascular;  Laterality: [name];  . POLYPECTOMY    . TONSILLECTOMY      No Known Allergies  Current Outpatient Prescriptions  Medication Sig Dispense Refill  . Ascorbic Acid (VITAMIN C) 1000 MG tablet Take 500 mg by mouth daily.     [name]  aspirin 81 MG tablet Take 81 mg by mouth daily.      . calcium-vitamin D (OSCAL WITH D) 500-200 MG-UNIT per tablet Take 1 tablet by mouth daily. Reported on 04/26/2015    . Cholecalciferol (VITAMIN D3) 2000 UNITS TABS Take by mouth daily.    . Coenzyme Q10 (CO Q 10 PO) Take 300 mg by mouth daily.    [name] ELIQUIS 5 MG TABS tablet take 1 tablet by mouth twice a day 180 tablet 3  . fish oil-omega-3 fatty acids 1000 MG capsule Take 1 g by mouth daily.      . furosemide (LASIX) 20 MG tablet Take 20 mg by mouth daily.  0  . glipiZIDE (GLUCOTROL) 10 MG tablet Take 10 mg by mouth 2 (two) times daily before a meal.    . losartan (COZAAR)  50 MG tablet Take 1 tablet (50 mg total) by mouth daily. 90 tablet 3  . metFORMIN (GLUCOPHAGE) 500 MG tablet Take 2 tablets (1,000 mg total) by mouth 2 (two) times daily with a meal.    . metoprolol succinate (TOPROL-XL) 25 MG 24 hr tablet Take 1 tablet (25 mg total) by mouth daily. 90 tablet 3  . Multiple Vitamin (MULTI-DAY VITAMINS PO) Take 1 tablet by mouth daily. 1 tab daily    . omeprazole (PRILOSEC) 20 MG capsule Take 20 mg by mouth 2 (two) times daily before a meal.     . simvastatin (ZOCOR) 20 MG tablet Take 20 mg by mouth daily at 6 PM.     . Turmeric 450 MG CAPS Take 1,350 mg by mouth daily.    . vitamin E 1000 UNIT capsule Take 1,000 Units by mouth every other day.    . Bevacizumab (AVASTIN) 100 MG/4ML SOLN Inject into the vein. EYE INJECTION     No current facility-administered medications for this visit.     Review of Systems : See HPI for pertinent positives and negatives.  Physical Examination  Vitals:   10/25/15 1430 10/25/15 1435  BP: 133/69 123/66  Pulse: 60   Resp: 16   Temp: 98 F (36.7 C)   TempSrc: Oral   SpO2: 99%   Weight: 212 lb (96.2 kg)   Height: 6' (1.829 m)    Body mass index is 28.75 kg/m.  General: WDWN male in NAD GAIT: normal Eyes: PERRLA Pulmonary: Respirations are non-labored, CTAB, no rales, rhonchi, or wheezing.  Cardiac: regular rhythm, no detected murmur.  VASCULAR EXAM Carotid Bruits Left Right   Negative Negative   Radial pulses are 2+ palpable and equal.     Gastrointestinal: soft, nontender, BS WNL, no r/g,no palpable masses.  Musculoskeletal: No muscle atrophy/wasting. M/S 5/5 throughout, Extremities without ischemic changes. No peripheral edema.   Neurologic: A&O X 3; Appropriate Affect, Speech is normal CN 2-12 intact, Pain and light touch intact in extremities, Motor  exam as listed above.   Non-Invasive Vascular Imaging CAROTID DUPLEX 10/25/2015   CEREBROVASCULAR DUPLEX EVALUATION    INDICATION: Carotid disease    PREVIOUS INTERVENTION(S):     DUPLEX EXAM:     RIGHT  LEFT  Peak Systolic Velocities (cm/s) End Diastolic Velocities (cm/s) Plaque LOCATION Peak Systolic Velocities (cm/s) End Diastolic Velocities (cm/s) Plaque  114 10  CCA PROXIMAL 119 18   154 21  CCA MID 123 23 HT  111 19  CCA DISTAL 119 22   178 0  ECA 149 9   312 78 HM, CP ICA PROXIMAL 121 28 CP  87 24  ICA MID 114 35   84 34  ICA DISTAL 101 33      ICA / CCA Ratio (PSV)   Antegrade Vertebral Flow Antegrade   Brachial Systolic Pressure (mmHg)   Triphasic Subclavian Artery Waveforms Triphasic    Plaque Morphology:  HM = Homogeneous, HT = Heterogeneous, CP = Calcific Plaque, SP = Smooth Plaque, IP = Irregular Plaque     ADDITIONAL FINDINGS:     IMPRESSION: Doppler velocities suggest a 60-79% right proximal internal carotid artery stenosis and a 1-39% left proximal internal carotid artery stenosis.    Compared to the previous exam:  No significant change noted when compared to the previous exam on 04/26/15.       Assessment: [name] Pelayo. is a 80 y.o. male who has no history of stroke or TIA.  Today's carotid Duplex suggests 60-79% right proximal internal carotid artery stenosis and a 1-39% left proximal internal carotid artery stenosis. No significant change noted when compared to the previous exam on 04/26/15.    His atherosclerotic risk factors include currently well controlled DM, remote history of smoking, CAD, and chronic renal insufficiency.  He takes Eliquis for a history of atrial fib, takes a daily ASA and a statin. He stays physically active.   Plan: Follow-up in 6 months with Carotid Duplex scan.   I discussed in depth with the patient the nature of atherosclerosis, and emphasized the importance of maximal medical management including strict control  of blood pressure, blood glucose, and lipid levels, obtaining regular exercise, and continued cessation of smoking.  The patient is aware that without maximal medical management the underlying atherosclerotic disease process will progress, limiting the benefit of any interventions. The patient was given information about stroke prevention and what symptoms should prompt the patient to seek immediate medical care. Thank you for allowing [place] to participate in this patient's care.  Clemon Chambers, RN, MSN, FNP-C [facility] Office: [phone]  Clinic Physician: Scot Dock on call  10/25/15 2:40 PM

## 2015-11-08 DIAGNOSIS — H43812 Vitreous degeneration, left eye: Secondary | ICD-10-CM | POA: Diagnosis not present

## 2015-11-08 DIAGNOSIS — H211X1 Other vascular disorders of iris and ciliary body, right eye: Secondary | ICD-10-CM | POA: Diagnosis not present

## 2015-11-08 DIAGNOSIS — E113593 Type 2 diabetes mellitus with proliferative diabetic retinopathy without macular edema, bilateral: Secondary | ICD-10-CM | POA: Diagnosis not present

## 2015-11-08 DIAGNOSIS — H35371 Puckering of macula, right eye: Secondary | ICD-10-CM | POA: Diagnosis not present

## 2015-12-14 ENCOUNTER — Encounter: Payer: Self-pay | Admitting: Cardiovascular Disease

## 2015-12-29 ENCOUNTER — Encounter: Payer: Self-pay | Admitting: Cardiovascular Disease

## 2015-12-29 ENCOUNTER — Ambulatory Visit (INDEPENDENT_AMBULATORY_CARE_PROVIDER_SITE_OTHER): Payer: PPO | Admitting: Cardiovascular Disease

## 2015-12-29 VITALS — BP 118/80 | HR 80 | Ht 72.0 in | Wt 215.6 lb

## 2015-12-29 DIAGNOSIS — I2510 Atherosclerotic heart disease of native coronary artery without angina pectoris: Secondary | ICD-10-CM | POA: Diagnosis not present

## 2015-12-29 DIAGNOSIS — E7800 Pure hypercholesterolemia, unspecified: Secondary | ICD-10-CM | POA: Diagnosis not present

## 2015-12-29 DIAGNOSIS — I10 Essential (primary) hypertension: Secondary | ICD-10-CM

## 2015-12-29 DIAGNOSIS — I428 Other cardiomyopathies: Secondary | ICD-10-CM | POA: Diagnosis not present

## 2015-12-29 DIAGNOSIS — I6523 Occlusion and stenosis of bilateral carotid arteries: Secondary | ICD-10-CM

## 2015-12-29 DIAGNOSIS — I480 Paroxysmal atrial fibrillation: Secondary | ICD-10-CM

## 2015-12-29 MED ORDER — APIXABAN 5 MG PO TABS
5.0000 mg | ORAL_TABLET | Freq: Two times a day (BID) | ORAL | 11 refills | Status: DC
Start: 2015-12-29 — End: 2017-01-10

## 2015-12-29 NOTE — Progress Notes (Signed)
Chief Complaint  Patient presents with  . Atrial Fibrillation     History of Present Illness: 80 yo male with history of DM, HTN, HLD, CAD s/p 4V CABG in 2007, carotid artery disease here today for follow up. He has been followed by Dr. Lia Foyer. He underwent 4V CABG 2007 (LIMA to LAD, SVG to OM, SVG sequential to PLA/PDA). Last cath February 2010 with 4 patent grafts, severe native vessel disease. He is known to have mild pulmonary artery HTN. Carotid artery disease followed in [facility] by Dr. Kellie Simmering. Last carotid doppler August 2017 with 60-79% RICA stenosis, less than 15% LICA stenosis. Echo 06/17/14 showed LVEF=25-30%, akinesis of the inferoseptum and apex. No significant valve disease. I arranged a cardiac cath on 05/27/14 which showed severe native vessel CAD and 4 patent bypass grafts. He was noted to be in atrial fibrillation the day of the cardiac cath. He was started on Eliquis 5 mg po BID .  He is here today for follow up. He is feeling well overall. No palpitations or chest pain. No dyspnea, PND, orthopnea, LE edema.   Primary Care Physician: Shirline Frees, MD  Past Medical History:  Diagnosis Date  . ANEMIA, HX OF   . Bronchial pneumonia Jan. 2016  . CAD, ARTERY BYPASS GRAFT   . CAROTID ARTERY STENOSIS   . Cataract   . DIABETES MELLITUS, TYPE II   . Diabetic retinal damage of right eye ([place]) 2015  . Diverticulosis   . HYPERCHOLESTEROLEMIA   . HYPERTENSION   . Myocardial infarction June 2007  . New onset atrial fibrillation ([place]) 06/24/2014  . PULMONARY HYPERTENSION   . RENAL INSUFFICIENCY, CHRONIC   . UNSPECIFIED THROMBOCYTOPENIA     Past Surgical History:  Procedure Laterality Date  . CARDIAC CATHETERIZATION  06/24/2014  . CATARACT EXTRACTION  2012   right eye  . COLONOSCOPY    . CORONARY ARTERY BYPASS GRAFT      quad bypass/2007  . EYE SURGERY    . LEFT HEART CATHETERIZATION WITH CORONARY/GRAFT ANGIOGRAM [name] 06/24/2014   Procedure: LEFT HEART CATHETERIZATION WITH  Beatrix Fetters;  Surgeon: Burnell Blanks, MD;  Location: [facility] CATH LAB;  Service: Cardiovascular;  Laterality: [name];  . POLYPECTOMY    . TONSILLECTOMY      Current Outpatient Prescriptions  Medication Sig Dispense Refill  . apixaban (ELIQUIS) 5 MG TABS tablet Take 1 tablet (5 mg total) by mouth 2 (two) times daily. 60 tablet 11  . Ascorbic Acid (VITAMIN C) 1000 MG tablet Take 500 mg by mouth daily.     [name] aspirin 81 MG tablet Take 81 mg by mouth daily.      . Bevacizumab (AVASTIN) 100 MG/4ML SOLN Inject into the vein. EYE INJECTION    . calcium-vitamin D (OSCAL WITH D) 500-200 MG-UNIT per tablet Take 1 tablet by mouth daily. Reported on 04/26/2015    . Cholecalciferol (VITAMIN D3) 2000 UNITS TABS Take 1 tablet by mouth daily.     . Coenzyme Q10 (CO Q 10 PO) Take 300 mg by mouth daily.    . fish oil-omega-3 fatty acids 1000 MG capsule Take 1 g by mouth daily.      . furosemide (LASIX) 20 MG tablet Take 20 mg by mouth daily.  0  . glipiZIDE (GLUCOTROL) 10 MG tablet Take 10 mg by mouth 2 (two) times daily before a meal.    . losartan (COZAAR) 50 MG tablet Take 1 tablet (50 mg total) by mouth daily. 90 tablet 3  .  metFORMIN (GLUCOPHAGE) 500 MG tablet Take 2 tablets (1,000 mg total) by mouth 2 (two) times daily with a meal.    . metoprolol succinate (TOPROL-XL) 25 MG 24 hr tablet Take 1 tablet (25 mg total) by mouth daily. 90 tablet 3  . Multiple Vitamin (MULTI-DAY VITAMINS PO) Take 1 tablet by mouth daily. 1 tab daily    . omeprazole (PRILOSEC) 20 MG capsule Take 20 mg by mouth 2 (two) times daily before a meal.     . simvastatin (ZOCOR) 20 MG tablet Take 20 mg by mouth daily at 6 PM.     . Turmeric 450 MG CAPS Take 1,350 mg by mouth daily.    . vitamin E 1000 UNIT capsule Take 1,000 Units by mouth every other day.     No current facility-administered medications for this visit.     No Known Allergies  Social History   Social History  . Marital status: Married    Spouse  name: [name]  . Number of children: 2  . Years of education: [name]   Occupational History  . Retired-sales Retired   Social History Main Topics  . Smoking status: Former Smoker    Types: Cigarettes    Quit date: 03/26/1970  . Smokeless tobacco: Never Used  . Alcohol use 0.6 oz/week    1 Glasses of wine per week  . Drug use: No  . Sexual activity: Not on file   Other Topics Concern  . Not on file   Social History Narrative  . No narrative on file    Family History  Problem Relation Age of Onset  . Heart disease Father 51    Heart disease before age 4  . Heart attack Father   . Hypertension Father   . Hyperlipidemia Father   . Varicose Veins Father   . Stroke Father     ? not sure  . Colon cancer Maternal Aunt   . Colon cancer Maternal Uncle   . Diverticulitis    . Heart attack      Review of Systems:  As stated in the HPI and otherwise negative.   BP 118/80   Pulse 80   Ht 6' (1.829 m)   Wt 215 lb 9.6 oz (97.8 kg)   BMI 29.24 kg/m   Physical Examination: General: Well developed, well nourished, NAD  HEENT: OP clear, mucus membranes moist  SKIN: warm, dry. No rashes. Neuro: No focal deficits  Musculoskeletal: Muscle strength 5/5 all ext  Psychiatric: Mood and affect normal  Neck: No JVD, no carotid bruits, no thyromegaly, no lymphadenopathy.  Lungs:Clear bilaterally, no wheezes, rhonci, crackles Cardiovascular: Irregular  No murmurs, gallops or rubs. Abdomen:Soft. Bowel sounds present. Non-tender.  Extremities: No lower extremity edema. Pulses are 2 + in the bilateral DP/PT.  Echo 06/17/14: Left ventricle: The cavity size was normal. Systolic function was severely reduced. The estimated ejection fraction was in the range of 25% to 30%. Diffuse hypokinesis. There is hypokinesis of the inferoseptal and apical myocardium. Features are consistent with a pseudonormal left ventricular filling pattern, with concomitant abnormal relaxation and increased  filling pressure (grade 2 diastolic dysfunction). - Mitral valve: Moderately calcified annulus. - Left atrium: The atrium was mildly dilated. - Right ventricle: The cavity size was mildly dilated. Wall thickness was normal. - Right atrium: The atrium was mildly dilated. - Pulmonary arteries: Systolic pressure was moderately increased. PA peak pressure: 51 mm Hg (S). Impressions: - EF is reduced when compared to prior echocardiogram.  Cardiac  cath 06/24/14: Hemodynamic Findings: Central aortic pressure: 124/62 Left ventricular pressure: 130/3/9 Angiographic Findings: Left main: 30% mid stenosis.  Left Anterior Descending Artery: Large caliber diffusely diseased vessel that courses to the apex. The proximal vessel has a 95% stenosis. There is a small caliber diagonal branch that is supplied by the proximal LAD.The mid LAD is occluded. The mid and distal LAD fills from the patent IMA graft. There is a moderate caliber septal perforating branch.  Circumflex Artery: Moderate caliber vessel with proximal 80% stenosis, 100% mid occlusion. The obtuse marginal branch is occluded and fills from the patent vein graft.  Right Coronary Artery: Large dominant vessel with diffuse 30% proximal stenosis, 95% mid stenosis, diffuse 30% distal stenosis. There is diffuse 90% distal stenosis just before the bifurcation. The PLA and PDA fill antegrade and from the patent sequential vein grafts.  Graft Anatomy:  SVG to OM is patent. There are two valves in the body of the vein graft and the appearance of this graft is unchanged from last cath in 2010.  SVG sequential to PLA/PDA is patent LIMA to mid LAD is patent. The target LAD vessel is small and diffusely diseased.  Left Ventricular Angiogram: Deferred.   EKG:  EKG is not ordered today. The ekg ordered today demonstrates   Recent Labs: No results found for requested labs within last 8760 hours.   Lipid Panel  Followed in primary care   Wt  Readings from Last 3 Encounters:  12/29/15 215 lb 9.6 oz (97.8 kg)  10/25/15 212 lb (96.2 kg)  06/02/15 205 lb (93 kg)    Other studies Reviewed: Additional studies/ records that were reviewed today include: . Review of the above records demonstrates:  Assessment and Plan:   1. Coronary artery disease without angina: Stable by cath 06/24/14. No chest pain suggestive of angina. Continue ASA, statin, beta blocker, Ace-inh.   2. Hyperlipidemia:  He is on a statin. Lipids followed in primary care.   3. Carotid artery disease: Followed in [facility] and stable by dopplers August 2017.    4. HTN: BP controlled. No changes today.   5. Non-ischemic Cardiomyopathy: LVEF is reduced as above. Prior to his bypass in 2007 his LVEF was around 30% but improved to 60% post bypass. Now LVEF is 30%. Continue medical management.   6. Atrial fibrillation, paroxysmal: He is anti-coagulated on Eliquis. He is in atrial fib today, rate control with Toprol. Amiodarone stopped due to fatigue.   Current medicines are reviewed at length with the patient today.  The patient does not have concerns regarding medicines.  The following changes have been made:  Amiodarone is added.   Labs/ tests ordered today include:   No orders of the defined types were placed in this encounter.   Disposition:   FU with me in 6 months.   Signed, [name], MD 12/29/2015 10:20 AM    [facility] [address], [place], [place]  [address] Phone: [phone]; Fax: [phone]

## 2015-12-29 NOTE — Patient Instructions (Signed)

## 2016-01-06 DIAGNOSIS — E113593 Type 2 diabetes mellitus with proliferative diabetic retinopathy without macular edema, bilateral: Secondary | ICD-10-CM | POA: Diagnosis not present

## 2016-01-06 DIAGNOSIS — H4051X2 Glaucoma secondary to other eye disorders, right eye, moderate stage: Secondary | ICD-10-CM | POA: Diagnosis not present

## 2016-01-09 DIAGNOSIS — I2510 Atherosclerotic heart disease of native coronary artery without angina pectoris: Secondary | ICD-10-CM | POA: Diagnosis not present

## 2016-01-09 DIAGNOSIS — I129 Hypertensive chronic kidney disease with stage 1 through stage 4 chronic kidney disease, or unspecified chronic kidney disease: Secondary | ICD-10-CM | POA: Diagnosis not present

## 2016-01-09 DIAGNOSIS — I480 Paroxysmal atrial fibrillation: Secondary | ICD-10-CM | POA: Diagnosis not present

## 2016-01-09 DIAGNOSIS — I10 Essential (primary) hypertension: Secondary | ICD-10-CM | POA: Diagnosis not present

## 2016-01-09 DIAGNOSIS — Z0000 Encounter for general adult medical examination without abnormal findings: Secondary | ICD-10-CM | POA: Diagnosis not present

## 2016-01-09 DIAGNOSIS — E1122 Type 2 diabetes mellitus with diabetic chronic kidney disease: Secondary | ICD-10-CM | POA: Diagnosis not present

## 2016-01-09 DIAGNOSIS — K219 Gastro-esophageal reflux disease without esophagitis: Secondary | ICD-10-CM | POA: Diagnosis not present

## 2016-01-09 DIAGNOSIS — Z23 Encounter for immunization: Secondary | ICD-10-CM | POA: Diagnosis not present

## 2016-01-09 DIAGNOSIS — E782 Mixed hyperlipidemia: Secondary | ICD-10-CM | POA: Diagnosis not present

## 2016-01-24 ENCOUNTER — Telehealth: Payer: Self-pay | Admitting: Cardiovascular Disease

## 2016-01-24 DIAGNOSIS — E113591 Type 2 diabetes mellitus with proliferative diabetic retinopathy without macular edema, right eye: Secondary | ICD-10-CM | POA: Diagnosis not present

## 2016-01-24 NOTE — Telephone Encounter (Signed)
I spoke with pt. He had laser treatment on right eye today. When he got to check out he felt like he may black out. Did not actually pass out.  He was taken back to a room and BP was 93/63.  He was given crackers/peanut butter and he felt better.  Was not npo prior to procedure and did eat breakfast. He reports he had previous laser treatment but today's was more painful.  Pt reports BP is usually 123-125/50's.   He is feeling OK at this time.  I asked him to check BP daily for next week or so and call us back with these readings.

## 2016-01-24 NOTE — Telephone Encounter (Signed)
New message  Wants to relay info from retina specialist that he saw today  Please call back

## 2016-01-25 NOTE — Telephone Encounter (Signed)
Agree. cdm 

## 2016-01-31 NOTE — Telephone Encounter (Signed)
Spoke with pt. He reports he is feeling fine.  No more episodes of low BP or feeling like he may pass out.  Using wrist BP cuff and BP running around 140/70

## 2016-02-09 ENCOUNTER — Other Ambulatory Visit: Payer: Self-pay | Admitting: *Deleted

## 2016-02-09 DIAGNOSIS — I6523 Occlusion and stenosis of bilateral carotid arteries: Secondary | ICD-10-CM

## 2016-02-24 DIAGNOSIS — H211X1 Other vascular disorders of iris and ciliary body, right eye: Secondary | ICD-10-CM | POA: Diagnosis not present

## 2016-02-24 DIAGNOSIS — H43812 Vitreous degeneration, left eye: Secondary | ICD-10-CM | POA: Diagnosis not present

## 2016-02-24 DIAGNOSIS — H4051X2 Glaucoma secondary to other eye disorders, right eye, moderate stage: Secondary | ICD-10-CM | POA: Diagnosis not present

## 2016-02-24 DIAGNOSIS — E113593 Type 2 diabetes mellitus with proliferative diabetic retinopathy without macular edema, bilateral: Secondary | ICD-10-CM | POA: Diagnosis not present

## 2016-04-19 DIAGNOSIS — E1122 Type 2 diabetes mellitus with diabetic chronic kidney disease: Secondary | ICD-10-CM | POA: Diagnosis not present

## 2016-04-24 ENCOUNTER — Encounter: Payer: Self-pay | Admitting: Family

## 2016-05-01 ENCOUNTER — Ambulatory Visit (HOSPITAL_COMMUNITY)
Admission: RE | Admit: 2016-05-01 | Discharge: 2016-05-01 | Disposition: A | Discharge status: Home / Self Care | Payer: PPO | Source: Ambulatory Visit | Attending: Family | Admitting: Family

## 2016-05-01 ENCOUNTER — Ambulatory Visit (INDEPENDENT_AMBULATORY_CARE_PROVIDER_SITE_OTHER): Payer: PPO | Admitting: Family

## 2016-05-01 ENCOUNTER — Encounter: Payer: Self-pay | Admitting: Family

## 2016-05-01 VITALS — BP 140/84 | HR 74 | Temp 97.6°F | Resp 16 | Ht 72.0 in | Wt 206.0 lb

## 2016-05-01 DIAGNOSIS — Z87891 Personal history of nicotine dependence: Secondary | ICD-10-CM | POA: Diagnosis not present

## 2016-05-01 DIAGNOSIS — I6523 Occlusion and stenosis of bilateral carotid arteries: Secondary | ICD-10-CM | POA: Diagnosis not present

## 2016-05-01 DIAGNOSIS — I872 Venous insufficiency (chronic) (peripheral): Secondary | ICD-10-CM

## 2016-05-01 NOTE — Patient Instructions (Signed)
Stroke Prevention Some medical conditions and behaviors are associated with an increased chance of having a stroke. You may prevent a stroke by making healthy choices and managing medical conditions. How can I reduce my risk of having a stroke?  Stay physically active. Get at least 30 minutes of activity on most or all days.  Do not smoke. It may also be helpful to avoid exposure to secondhand smoke.  Limit alcohol use. Moderate alcohol use is considered to be:  No more than 2 drinks per day for men.  No more than 1 drink per day for nonpregnant women.  Eat healthy foods. This involves:  Eating 5 or more servings of fruits and vegetables a day.  Making dietary changes that address high blood pressure (hypertension), high cholesterol, diabetes, or obesity.  Manage your cholesterol levels.  Making food choices that are high in fiber and low in saturated fat, trans fat, and cholesterol may control cholesterol levels.  Take any prescribed medicines to control cholesterol as directed by your health care provider.  Manage your diabetes.  Controlling your carbohydrate and sugar intake is recommended to manage diabetes.  Take any prescribed medicines to control diabetes as directed by your health care provider.  Control your hypertension.  Making food choices that are low in salt (sodium), saturated fat, trans fat, and cholesterol is recommended to manage hypertension.  Ask your health care provider if you need treatment to lower your blood pressure. Take any prescribed medicines to control hypertension as directed by your health care provider.  If you are 18-39 years of age, have your blood pressure checked every 3-5 years. If you are 40 years of age or older, have your blood pressure checked every year.  Maintain a healthy weight.  Reducing calorie intake and making food choices that are low in sodium, saturated fat, trans fat, and cholesterol are recommended to manage  weight.  Stop drug abuse.  Avoid taking birth control pills.  Talk to your health care provider about the risks of taking birth control pills if you are over 35 years old, smoke, get migraines, or have ever had a blood clot.  Get evaluated for sleep disorders (sleep apnea).  Talk to your health care provider about getting a sleep evaluation if you snore a lot or have excessive sleepiness.  Take medicines only as directed by your health care provider.  For some people, aspirin or blood thinners (anticoagulants) are helpful in reducing the risk of forming abnormal blood clots that can lead to stroke. If you have the irregular heart rhythm of atrial fibrillation, you should be on a blood thinner unless there is a good reason you cannot take them.  Understand all your medicine instructions.  Make sure that other conditions (such as anemia or atherosclerosis) are addressed. Get help right away if:  You have sudden weakness or numbness of the face, arm, or leg, especially on one side of the body.  Your face or eyelid droops to one side.  You have sudden confusion.  You have trouble speaking (aphasia) or understanding.  You have sudden trouble seeing in one or both eyes.  You have sudden trouble walking.  You have dizziness.  You have a loss of balance or coordination.  You have a sudden, severe headache with no known cause.  You have new chest pain or an irregular heartbeat. Any of these symptoms may represent a serious problem that is an emergency. Do not wait to see if the symptoms will go away.   Get medical help at once. Call your local emergency services (911 in [place] Do not drive yourself to the hospital. This information is not intended to replace advice given to you by your health care provider. Make sure you discuss any questions you have with your health care provider. Document Released: 04/19/2004 Document Revised: 08/18/2015 Document Reviewed: 09/12/2012 Elsevier  Interactive Patient Education  2017 Elsevier Inc.  

## 2016-05-01 NOTE — Progress Notes (Signed)
Chief Complaint: Follow up Extracranial Carotid Artery Stenosis   History of Present Illness  [name]. is a 81 y.o. male  patient of Dr. Donnetta Hutching who presents today for followup of his moderate to severe asymptomatic right internal carotid artery stenosis. He is right-handed. He denies any neurologic deficits specifically no amaurosis fugax, transient ischemic attack or stroke. He reports that he is also stable from a cardiac standpoint. He had his carotid stenosis initially discovered at the time of coronary bypass grafting in 2007 and has had serial ultrasounds since then.  Patient has not had previous carotid artery intervention. He denies tingling, weakness, or pain in either hand or arm, denies dizziness with raising arms above his head.  He denies claudication symptoms in his legs with walking, denies non healing wounds. He works out at a gym 3 days/week.  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   He has not had much swelling in his legs since he lost weight and has been taking furosemide.  New onset atrial fib March 2016, placed on Eliquis, converted to SR with medications about a month later, remains on Eliquis. He was started on furosemide for LE edema and since developing atrial fib; this has helped his LE edema and his breathing.  He had pneumonia January 2016.  Pt Diabetic: Yes, states his last A1C was greater than 7.0, states he has since improved his lifestyle habits Pt smoker: former smoker, quit in 1972   Past Medical History:  Diagnosis Date  . ANEMIA, HX OF   . Bronchial pneumonia Jan. 2016  . CAD, ARTERY BYPASS GRAFT   . CAROTID ARTERY STENOSIS   . Cataract   . DIABETES MELLITUS, TYPE II   . Diabetic retinal damage of right eye ([place]) 2015  . Diverticulosis   . HYPERCHOLESTEROLEMIA   . HYPERTENSION   . Myocardial infarction June 2007  . New onset atrial fibrillation ([place]) 06/24/2014  . PULMONARY HYPERTENSION   . RENAL INSUFFICIENCY, CHRONIC    . UNSPECIFIED THROMBOCYTOPENIA     Social History Social History  Substance Use Topics  . Smoking status: Former Smoker    Types: Cigarettes    Quit date: 03/26/1970  . Smokeless tobacco: Never Used  . Alcohol use 0.6 oz/week    1 Glasses of wine per week    Family History Family History  Problem Relation Age of Onset  . Heart disease Father 28    Heart disease before age 49  . Heart attack Father   . Hypertension Father   . Hyperlipidemia Father   . Varicose Veins Father   . Stroke Father     ? not sure  . Colon cancer Maternal Aunt   . Colon cancer Maternal Uncle   . Diverticulitis    . Heart attack      Surgical History Past Surgical History:  Procedure Laterality Date  . CARDIAC CATHETERIZATION  06/24/2014  . CATARACT EXTRACTION  2012   right eye  . COLONOSCOPY    . CORONARY ARTERY BYPASS GRAFT      quad bypass/2007  . EYE SURGERY    . LEFT HEART CATHETERIZATION WITH CORONARY/GRAFT ANGIOGRAM [name] 06/24/2014   Procedure: LEFT HEART CATHETERIZATION WITH Beatrix Fetters;  Surgeon: Burnell Blanks, MD;  Location[facility] CATH LAB;  Service: Cardiovascular;  Laterality: [name];  . POLYPECTOMY    . TONSILLECTOMY      No Known Allergies  Current Outpatient Prescriptions  Medication Sig Dispense Refill  . apixaban (ELIQUIS)  5 MG TABS tablet Take 1 tablet (5 mg total) by mouth 2 (two) times daily. 60 tablet 11  . Ascorbic Acid (VITAMIN C) 1000 MG tablet Take 500 mg by mouth daily.     [name] aspirin 81 MG tablet Take 81 mg by mouth daily.      . Bevacizumab (AVASTIN) 100 MG/4ML SOLN Inject into the vein. EYE INJECTION    . calcium-vitamin D (OSCAL WITH D) 500-200 MG-UNIT per tablet Take 1 tablet by mouth daily. Reported on 04/26/2015    . Cholecalciferol (VITAMIN D3) 2000 UNITS TABS Take 1 tablet by mouth daily.     . Coenzyme Q10 (CO Q 10 PO) Take 300 mg by mouth daily.    . fish oil-omega-3 fatty acids 1000 MG capsule Take 1 g by mouth daily.      . furosemide  (LASIX) 20 MG tablet Take 20 mg by mouth daily.  0  . glipiZIDE (GLUCOTROL) 10 MG tablet Take 10 mg by mouth 2 (two) times daily before a meal.    . losartan (COZAAR) 50 MG tablet Take 1 tablet (50 mg total) by mouth daily. 90 tablet 3  . metFORMIN (GLUCOPHAGE) 500 MG tablet Take 2 tablets (1,000 mg total) by mouth 2 (two) times daily with a meal.    . metoprolol succinate (TOPROL-XL) 25 MG 24 hr tablet Take 1 tablet (25 mg total) by mouth daily. 90 tablet 3  . Multiple Vitamin (MULTI-DAY VITAMINS PO) Take 1 tablet by mouth daily. 1 tab daily    . omeprazole (PRILOSEC) 20 MG capsule Take 20 mg by mouth 2 (two) times daily before a meal.     . oxyCODONE-acetaminophen (PERCOCET/ROXICET) 5-325 MG tablet take 1 tablet by mouth every 6 hours if needed for pain  0  . simvastatin (ZOCOR) 20 MG tablet Take 20 mg by mouth daily at 6 PM.     . Turmeric 450 MG CAPS Take 1,350 mg by mouth daily.    . vitamin E 1000 UNIT capsule Take 1,000 Units by mouth every other day.     No current facility-administered medications for this visit.     Review of Systems : See HPI for pertinent positives and negatives.  Physical Examination  Vitals:   05/01/16 1150 05/01/16 1151  BP: 136/74 140/84  Pulse: 74 74  Resp: 16   Temp: 97.6 F (36.4 C)   SpO2: 100%   Weight: 206 lb (93.4 kg)   Height: 6' (1.829 m)    Body mass index is 27.94 kg/m.  General: WDWN male in NAD GAIT:normal Eyes: PERRLA Pulmonary: Respirations are non-labored, CTAB, no rales, rhonchi, or wheezing.  Cardiac: Mostly regular rhythm with occasional premature contractipons, no detected murmur.  VASCULAR EXAM Carotid Bruits Left Right   Negative Negative   Radial pulses are 2+ palpable and equal.     Gastrointestinal: soft, nontender, BS WNL, no r/g,no palpable  masses.  Musculoskeletal: No muscle atrophy/wasting. M/S 5/5 throughout, Extremities without ischemic changes. No peripheral edema.   Neurologic: A&O X 3; Appropriate Affect, Speech is normal CN 2-12 intact, Pain and light touch intact in extremities, Motor exam as listed above.    Assessment: [name]. is a 81 y.o. male who has no history of stroke or TIA.   His atherosclerotic risk factors include currently controlled DM, remote history of smoking, CAD, and chronic renal insufficiency.  He takes Eliquis for a history of atrial fib, takes a daily ASA and a statin. He stays physically  active.   DATA Today's carotid Duplex suggests 60-79% right proximal internal carotid artery stenosis and a 1-39% left proximal internal carotid artery stenosis.  Bilateral vertebral artery flow is antegrade.  Bilateral subclavian artery waveforms are normal.  No significant change noted when compared to the previous exams on 04/26/15 and 10-25-15.     Plan: Follow-up in 6 months with Carotid Duplex scan.    I discussed in depth with the patient the nature of atherosclerosis, and emphasized the importance of maximal medical management including strict control of blood pressure, blood glucose, and lipid levels, obtaining regular exercise, and continued cessation of smoking.  The patient is aware that without maximal medical management the underlying atherosclerotic disease process will progress, limiting the benefit of any interventions. The patient was given information about stroke prevention and what symptoms should prompt the patient to seek immediate medical care. Thank you for allowing [place] to participate in this patient's care.  Clemon Chambers, RN, MSN, FNP-C [facility] Office: [phone]  Clinic Physician: Early  05/01/16 11:58 AM

## 2016-05-02 NOTE — Addendum Note (Signed)
Addended by: Lianne Cure A on: 05/02/2016 02:13 PM   Modules accepted: Orders

## 2016-05-25 DIAGNOSIS — E113593 Type 2 diabetes mellitus with proliferative diabetic retinopathy without macular edema, bilateral: Secondary | ICD-10-CM | POA: Diagnosis not present

## 2016-05-25 DIAGNOSIS — H35371 Puckering of macula, right eye: Secondary | ICD-10-CM | POA: Diagnosis not present

## 2016-05-25 DIAGNOSIS — H401131 Primary open-angle glaucoma, bilateral, mild stage: Secondary | ICD-10-CM | POA: Diagnosis not present

## 2016-07-06 ENCOUNTER — Ambulatory Visit (INDEPENDENT_AMBULATORY_CARE_PROVIDER_SITE_OTHER): Payer: PPO | Admitting: Cardiovascular Disease

## 2016-07-06 ENCOUNTER — Encounter: Payer: Self-pay | Admitting: Cardiovascular Disease

## 2016-07-06 VITALS — BP 140/78 | HR 70 | Ht 72.0 in | Wt 202.8 lb

## 2016-07-06 DIAGNOSIS — I6523 Occlusion and stenosis of bilateral carotid arteries: Secondary | ICD-10-CM | POA: Diagnosis not present

## 2016-07-06 DIAGNOSIS — E7800 Pure hypercholesterolemia, unspecified: Secondary | ICD-10-CM

## 2016-07-06 DIAGNOSIS — I4819 Other persistent atrial fibrillation: Secondary | ICD-10-CM

## 2016-07-06 DIAGNOSIS — I481 Persistent atrial fibrillation: Secondary | ICD-10-CM

## 2016-07-06 DIAGNOSIS — I428 Other cardiomyopathies: Secondary | ICD-10-CM

## 2016-07-06 DIAGNOSIS — I10 Essential (primary) hypertension: Secondary | ICD-10-CM | POA: Diagnosis not present

## 2016-07-06 DIAGNOSIS — I2510 Atherosclerotic heart disease of native coronary artery without angina pectoris: Secondary | ICD-10-CM

## 2016-07-06 NOTE — Progress Notes (Signed)
Chief Complaint  Patient presents with  . Follow-up    CAD     History of Present Illness: 81 yo male with history of atrial fibrillation, NICM, DM, HTN, HLD, CAD s/p 4V CABG in 2007, carotid artery disease here today for follow up. He underwent 4V CABG 2007 (LIMA to LAD, SVG to OM, SVG sequential to PLA/PDA). Carotid artery disease followed in [facility]. Last carotid doppler February 2018 with 60-79% RICA stenosis, less than 17% LICA stenosis. Echo 06/17/14 showed LVEF=25-30%, akinesis of the inferoseptum and apex. No significant valve disease. I arranged a cardiac cath on 05/27/14 which showed severe native vessel CAD and 4 patent bypass grafts. He was noted to be in atrial fibrillation the day of the cardiac cath and was started on Eliquis.   He is here today for follow up. The patient denies any chest pain, dyspnea, palpitations, lower extremity edema, orthopnea, PND, dizziness, near syncope or syncope. He has some fatigue.     Primary Care Physician: Shirline Frees, MD  Past Medical History:  Diagnosis Date  . ANEMIA, HX OF   . Bronchial pneumonia Jan. 2016  . CAD, ARTERY BYPASS GRAFT   . CAROTID ARTERY STENOSIS   . Cataract   . DIABETES MELLITUS, TYPE II   . Diabetic retinal damage of right eye ([place]) 2015  . Diverticulosis   . HYPERCHOLESTEROLEMIA   . HYPERTENSION   . Myocardial infarction June 2007  . New onset atrial fibrillation ([place]) 06/24/2014  . PULMONARY HYPERTENSION   . RENAL INSUFFICIENCY, CHRONIC   . UNSPECIFIED THROMBOCYTOPENIA     Past Surgical History:  Procedure Laterality Date  . CARDIAC CATHETERIZATION  06/24/2014  . CATARACT EXTRACTION  2012   right eye  . COLONOSCOPY    . CORONARY ARTERY BYPASS GRAFT      quad bypass/2007  . EYE SURGERY    . LEFT HEART CATHETERIZATION WITH CORONARY/GRAFT ANGIOGRAM [name] 06/24/2014   Procedure: LEFT HEART CATHETERIZATION WITH Beatrix Fetters;  Surgeon: Burnell Blanks, MD;  Location: [facility] CATH LAB;  Service:  Cardiovascular;  Laterality: [name];  . POLYPECTOMY    . TONSILLECTOMY      Current Outpatient Prescriptions  Medication Sig Dispense Refill  . apixaban (ELIQUIS) 5 MG TABS tablet Take 1 tablet (5 mg total) by mouth 2 (two) times daily. 60 tablet 11  . Ascorbic Acid (VITAMIN C) 1000 MG tablet Take 500 mg by mouth daily.     [name] aspirin 81 MG tablet Take 81 mg by mouth daily.      . Bevacizumab (AVASTIN) 100 MG/4ML SOLN as directed. EYE INJECTION    . calcium-vitamin D (OSCAL WITH D) 500-200 MG-UNIT per tablet Take 1 tablet by mouth daily. Reported on 04/26/2015    . Cholecalciferol (VITAMIN D3) 2000 UNITS TABS Take 1 tablet by mouth daily.     . Coenzyme Q10 (CO Q 10 PO) Take 300 mg by mouth daily.    . fish oil-omega-3 fatty acids 1000 MG capsule Take 1 g by mouth daily.      . furosemide (LASIX) 20 MG tablet Take 20 mg by mouth daily.  0  . glipiZIDE (GLUCOTROL) 10 MG tablet Take 10 mg by mouth 2 (two) times daily before a meal.    . losartan (COZAAR) 50 MG tablet Take 1 tablet (50 mg total) by mouth daily. 90 tablet 3  . metFORMIN (GLUCOPHAGE) 500 MG tablet Take 2 tablets (1,000 mg total) by mouth 2 (two) times daily with a meal.    .  metoprolol succinate (TOPROL-XL) 25 MG 24 hr tablet Take 1 tablet (25 mg total) by mouth daily. 90 tablet 3  . Multiple Vitamin (MULTI-DAY VITAMINS PO) Take 1 tablet by mouth daily. 1 tab daily    . omeprazole (PRILOSEC) 20 MG capsule Take 20 mg by mouth 2 (two) times daily before a meal.     . oxyCODONE-acetaminophen (PERCOCET/ROXICET) 5-325 MG tablet take 1 tablet by mouth every 6 hours if needed for pain  0  . simvastatin (ZOCOR) 20 MG tablet Take 20 mg by mouth daily at 6 PM.     . Turmeric 450 MG CAPS Take 1,350 mg by mouth daily.    . vitamin E 1000 UNIT capsule Take 1,000 Units by mouth every other day.     No current facility-administered medications for this visit.     No Known Allergies  Social History   Social History  . Marital status:  Married    Spouse name: [name]  . Number of children: 2  . Years of education: [name]   Occupational History  . Retired-sales Retired   Social History Main Topics  . Smoking status: Former Smoker    Types: Cigarettes    Quit date: 03/26/1970  . Smokeless tobacco: Never Used  . Alcohol use 0.6 oz/week    1 Glasses of wine per week  . Drug use: No  . Sexual activity: Not on file   Other Topics Concern  . Not on file   Social History Narrative  . No narrative on file    Family History  Problem Relation Age of Onset  . Heart disease Father 69    Heart disease before age 27  . Heart attack Father   . Hypertension Father   . Hyperlipidemia Father   . Varicose Veins Father   . Stroke Father     ? not sure  . Colon cancer Maternal Aunt   . Colon cancer Maternal Uncle   . Diverticulitis    . Heart attack      Review of Systems:  As stated in the HPI and otherwise negative.   BP 140/78   Pulse 70   Ht 6' (1.829 m)   Wt 202 lb 12.8 oz (92 kg)   SpO2 99%   BMI 27.50 kg/m   Physical Examination: General: Well developed, well nourished, NAD  HEENT: OP clear, mucus membranes moist  SKIN: warm, dry. No rashes. Neuro: No focal deficits  Musculoskeletal: Muscle strength 5/5 all ext  Psychiatric: Mood and affect normal  Neck: No JVD, no carotid bruits, no thyromegaly, no lymphadenopathy.  Lungs:Clear bilaterally, no wheezes, rhonci, crackles Cardiovascular: Regular rate and rhythm. No murmurs, gallops or rubs. Abdomen:Soft. Bowel sounds present. Non-tender.  Extremities: No lower extremity edema. Pulses are 2 + in the bilateral DP/PT.  Echo 06/17/14: Left ventricle: The cavity size was normal. Systolic function was severely reduced. The estimated ejection fraction was in the range of 25% to 30%. Diffuse hypokinesis. There is hypokinesis of the inferoseptal and apical myocardium. Features are consistent with a pseudonormal left ventricular filling pattern,  with concomitant abnormal relaxation and increased filling pressure (grade 2 diastolic dysfunction). - Mitral valve: Moderately calcified annulus. - Left atrium: The atrium was mildly dilated. - Right ventricle: The cavity size was mildly dilated. Wall thickness was normal. - Right atrium: The atrium was mildly dilated. - Pulmonary arteries: Systolic pressure was moderately increased. PA peak pressure: 51 mm Hg (S). Impressions: - EF is reduced when compared to prior  echocardiogram.  Cardiac cath 06/24/14: Hemodynamic Findings: Central aortic pressure: 124/62 Left ventricular pressure: 130/3/9 Angiographic Findings: Left main: 30% mid stenosis.  Left Anterior Descending Artery: Large caliber diffusely diseased vessel that courses to the apex. The proximal vessel has a 95% stenosis. There is a small caliber diagonal branch that is supplied by the proximal LAD.The mid LAD is occluded. The mid and distal LAD fills from the patent IMA graft. There is a moderate caliber septal perforating branch.  Circumflex Artery: Moderate caliber vessel with proximal 80% stenosis, 100% mid occlusion. The obtuse marginal branch is occluded and fills from the patent vein graft.  Right Coronary Artery: Large dominant vessel with diffuse 30% proximal stenosis, 95% mid stenosis, diffuse 30% distal stenosis. There is diffuse 90% distal stenosis just before the bifurcation. The PLA and PDA fill antegrade and from the patent sequential vein grafts.  Graft Anatomy:  SVG to OM is patent. There are two valves in the body of the vein graft and the appearance of this graft is unchanged from last cath in 2010.  SVG sequential to PLA/PDA is patent LIMA to mid LAD is patent. The target LAD vessel is small and diffusely diseased.  Left Ventricular Angiogram: Deferred.   EKG:  EKG is ordered today. The ekg ordered today demonstrates Atrial fib, rate 70 bpm.   Recent Labs: No results found for requested labs  within last 8760 hours.   Lipid Panel  Followed in primary care   Wt Readings from Last 3 Encounters:  07/06/16 202 lb 12.8 oz (92 kg)  05/01/16 206 lb (93.4 kg)  12/29/15 215 lb 9.6 oz (97.8 kg)    Other studies Reviewed: Additional studies/ records that were reviewed today include: . Review of the above records demonstrates:  Assessment and Plan:   1. Coronary artery disease without angina: He has no chest pain suggestive of angina. Stable CAD by cath 06/24/14.Continue ASA, statin, beta blocker, Ace-inh.   2. Hyperlipidemia:  He is on a statin. Lipids followed in primary care.   3. Carotid artery disease: Followed in [facility] and stable by dopplers February 2018.     4. HTN: His BP is controlled today. No changes.    5. Non-ischemic Cardiomyopathy: He is known to have reduced LV systolic function, presumed to be NICM. Prior to his bypass in 2007 his LVEF was around 30% but improved to 60% post bypass. Bypasses patent by most recent cath but LV function is now reduced. Continue medical management.   6. Atrial fibrillation, persistent: He is anti-coagulated on Eliquis. He is in atrial fib today, rate control with Toprol. Amiodarone stopped due to fatigue.   Current medicines are reviewed at length with the patient today.  The patient does not have concerns regarding medicines.  The following changes have been made:   Labs/ tests ordered today include:   Orders Placed This Encounter  Procedures  . EKG 12-Lead    Disposition:   FU with me in 6 months.   Signed, [name], MD 07/06/2016 9:46 AM    [facility] [address], [place], [place]  [address] Phone: [phone]; Fax: [phone]

## 2016-07-06 NOTE — Patient Instructions (Signed)

## 2016-07-09 DIAGNOSIS — I429 Cardiomyopathy, unspecified: Secondary | ICD-10-CM | POA: Diagnosis not present

## 2016-07-09 DIAGNOSIS — I480 Paroxysmal atrial fibrillation: Secondary | ICD-10-CM | POA: Diagnosis not present

## 2016-07-09 DIAGNOSIS — K219 Gastro-esophageal reflux disease without esophagitis: Secondary | ICD-10-CM | POA: Diagnosis not present

## 2016-07-09 DIAGNOSIS — N183 Chronic kidney disease, stage 3 (moderate): Secondary | ICD-10-CM | POA: Diagnosis not present

## 2016-07-09 DIAGNOSIS — I10 Essential (primary) hypertension: Secondary | ICD-10-CM | POA: Diagnosis not present

## 2016-07-09 DIAGNOSIS — E1122 Type 2 diabetes mellitus with diabetic chronic kidney disease: Secondary | ICD-10-CM | POA: Diagnosis not present

## 2016-07-09 DIAGNOSIS — I2510 Atherosclerotic heart disease of native coronary artery without angina pectoris: Secondary | ICD-10-CM | POA: Diagnosis not present

## 2016-07-09 DIAGNOSIS — I129 Hypertensive chronic kidney disease with stage 1 through stage 4 chronic kidney disease, or unspecified chronic kidney disease: Secondary | ICD-10-CM | POA: Diagnosis not present

## 2016-07-09 DIAGNOSIS — E782 Mixed hyperlipidemia: Secondary | ICD-10-CM | POA: Diagnosis not present

## 2016-08-22 DIAGNOSIS — H35371 Puckering of macula, right eye: Secondary | ICD-10-CM | POA: Diagnosis not present

## 2016-08-22 DIAGNOSIS — E113593 Type 2 diabetes mellitus with proliferative diabetic retinopathy without macular edema, bilateral: Secondary | ICD-10-CM | POA: Diagnosis not present

## 2016-08-22 DIAGNOSIS — H43812 Vitreous degeneration, left eye: Secondary | ICD-10-CM | POA: Diagnosis not present

## 2016-08-22 DIAGNOSIS — H401131 Primary open-angle glaucoma, bilateral, mild stage: Secondary | ICD-10-CM | POA: Diagnosis not present

## 2016-10-15 ENCOUNTER — Encounter: Payer: Self-pay | Admitting: Family

## 2016-10-30 ENCOUNTER — Ambulatory Visit (HOSPITAL_COMMUNITY)
Admission: RE | Admit: 2016-10-30 | Discharge: 2016-10-30 | Disposition: A | Discharge status: Home / Self Care | Payer: PPO | Source: Ambulatory Visit | Attending: Vascular Surgery | Admitting: Vascular Surgery

## 2016-10-30 ENCOUNTER — Encounter: Payer: Self-pay | Admitting: Family

## 2016-10-30 ENCOUNTER — Ambulatory Visit (INDEPENDENT_AMBULATORY_CARE_PROVIDER_SITE_OTHER): Payer: PPO | Admitting: Family

## 2016-10-30 VITALS — BP 129/83 | HR 63 | Temp 97.0°F | Resp 18 | Ht 72.0 in | Wt 205.0 lb

## 2016-10-30 DIAGNOSIS — I6523 Occlusion and stenosis of bilateral carotid arteries: Secondary | ICD-10-CM

## 2016-10-30 DIAGNOSIS — I872 Venous insufficiency (chronic) (peripheral): Secondary | ICD-10-CM

## 2016-10-30 DIAGNOSIS — Z87891 Personal history of nicotine dependence: Secondary | ICD-10-CM | POA: Diagnosis not present

## 2016-10-30 LAB — VAS US CAROTID
LEFT VERTEBRAL DIAS: -13 cm/s
Left CCA dist dias: -20 cm/s
Left CCA dist sys: -89 cm/s
Left CCA prox dias: 27 cm/s
Left CCA prox sys: 111 cm/s
Left ICA dist dias: -39 cm/s
Left ICA dist sys: -89 cm/s
Left ICA prox dias: -27 cm/s
Left ICA prox sys: -93 cm/s
RIGHT CCA MID DIAS: -22 cm/s
RIGHT ECA DIAS: -11 cm/s
RIGHT VERTEBRAL DIAS: -12 cm/s
Right CCA prox dias: -18 cm/s
Right CCA prox sys: -94 cm/s
Right cca dist sys: -76 cm/s

## 2016-10-30 NOTE — Patient Instructions (Signed)
Stroke Prevention Some medical conditions and behaviors are associated with an increased chance of having a stroke. You may prevent a stroke by making healthy choices and managing medical conditions. How can I reduce my risk of having a stroke?  Stay physically active. Get at least 30 minutes of activity on most or all days.  Do not smoke. It may also be helpful to avoid exposure to secondhand smoke.  Limit alcohol use. Moderate alcohol use is considered to be: ? No more than 2 drinks per day for men. ? No more than 1 drink per day for nonpregnant women.  Eat healthy foods. This involves: ? Eating 5 or more servings of fruits and vegetables a day. ? Making dietary changes that address high blood pressure (hypertension), high cholesterol, diabetes, or obesity.  Manage your cholesterol levels. ? Making food choices that are high in fiber and low in saturated fat, trans fat, and cholesterol may control cholesterol levels. ? Take any prescribed medicines to control cholesterol as directed by your health care provider.  Manage your diabetes. ? Controlling your carbohydrate and sugar intake is recommended to manage diabetes. ? Take any prescribed medicines to control diabetes as directed by your health care provider.  Control your hypertension. ? Making food choices that are low in salt (sodium), saturated fat, trans fat, and cholesterol is recommended to manage hypertension. ? Ask your health care provider if you need treatment to lower your blood pressure. Take any prescribed medicines to control hypertension as directed by your health care provider. ? If you are 18-39 years of age, have your blood pressure checked every 3-5 years. If you are 40 years of age or older, have your blood pressure checked every year.  Maintain a healthy weight. ? Reducing calorie intake and making food choices that are low in sodium, saturated fat, trans fat, and cholesterol are recommended to manage  weight.  Stop drug abuse.  Avoid taking birth control pills. ? Talk to your health care provider about the risks of taking birth control pills if you are over 35 years old, smoke, get migraines, or have ever had a blood clot.  Get evaluated for sleep disorders (sleep apnea). ? Talk to your health care provider about getting a sleep evaluation if you snore a lot or have excessive sleepiness.  Take medicines only as directed by your health care provider. ? For some people, aspirin or blood thinners (anticoagulants) are helpful in reducing the risk of forming abnormal blood clots that can lead to stroke. If you have the irregular heart rhythm of atrial fibrillation, you should be on a blood thinner unless there is a good reason you cannot take them. ? Understand all your medicine instructions.  Make sure that other conditions (such as anemia or atherosclerosis) are addressed. Get help right away if:  You have sudden weakness or numbness of the face, arm, or leg, especially on one side of the body.  Your face or eyelid droops to one side.  You have sudden confusion.  You have trouble speaking (aphasia) or understanding.  You have sudden trouble seeing in one or both eyes.  You have sudden trouble walking.  You have dizziness.  You have a loss of balance or coordination.  You have a sudden, severe headache with no known cause.  You have new chest pain or an irregular heartbeat. Any of these symptoms may represent a serious problem that is an emergency. Do not wait to see if the symptoms will go away.   Get medical help at once. Call your local emergency services (911 in [place] Do not drive yourself to the hospital. This information is not intended to replace advice given to you by your health care provider. Make sure you discuss any questions you have with your health care provider. Document Released: 04/19/2004 Document Revised: 08/18/2015 Document Reviewed: 09/12/2012 Elsevier  Interactive Patient Education  2017 Elsevier Inc.     Preventing Cerebrovascular Disease Arteries are blood vessels that carry blood that contains oxygen from the heart to all parts of the body. Cerebrovascular disease affects arteries that supply the brain. Any condition that blocks or disrupts blood flow to the brain can cause cerebrovascular disease. Brain cells that lose blood supply start to die within minutes (stroke). Stroke is the main danger of cerebrovascular disease. Atherosclerosis and high blood pressure are common causes of cerebrovascular disease. Atherosclerosis is narrowing and hardening of an artery that results when fat, cholesterol, calcium, or other substances (plaque) build up inside an artery. Plaque reduces blood flow through the artery. High blood pressure increases the risk of bleeding inside the brain. Making diet and lifestyle changes to prevent atherosclerosis and high blood pressure lowers your risk of cerebrovascular disease. What nutrition changes can be made?  Eat more fruits, vegetables, and whole grains.  Reduce how much saturated fat you eat. To do this, eat less red meat and fewer full-fat dairy products.  Eat healthy proteins instead of red meat. Healthy proteins include: ? Fish. Eat fish that contains heart-healthy omega-3 fatty acids, twice a week. Examples include salmon, albacore tuna, mackerel, and herring. ? Chicken. ? Nuts. ? Low-fat or nonfat yogurt.  Avoid processed meats, like bacon and lunchmeat.  Avoid foods that contain: ? A lot of sugar, such as sweets and drinks with added sugar. ? A lot of salt (sodium). Avoid adding extra salt to your food, as told by your health care provider. ? Trans fats, such as margarine and baked goods. Trans fats may be listed as "partially hydrogenated oils" on food labels.  Check food labels to see how much sodium, sugar, and trans fats are in foods.  Use vegetable oils that contain low amounts of  saturated fat, such as olive oil or canola oil. What lifestyle changes can be made?  Drink alcohol in moderation. This means no more than 1 drink a day for nonpregnant women and 2 drinks a day for men. One drink equals 12 oz of beer, 5 oz of wine, or 1 oz of hard liquor.  If you are overweight, ask your health care provider to recommend a weight-loss plan for you. Losing 5-10 lb (2.2-4.5 kg) can reduce your risk of diabetes, atherosclerosis, and high blood pressure.  Exercise for 30?60 minutes on most days, or as much as told by your health care provider. ? Do moderate-intensity exercise, such as brisk walking, bicycling, and water aerobics. Ask your health care provider which activities are safe for you.  Do not use any products that contain nicotine or tobacco, such as cigarettes and e-cigarettes. If you need help quitting, ask your health care provider. Why are these changes important? Making these changes lowers your risk of many diseases that can cause cerebrovascular disease and stroke. Stroke is a leading cause of death and disability. Making these changes also improves your overall health and quality of life. What can I do to lower my risk? The following factors make you more likely to develop cerebrovascular disease:  Being overweight.  Smoking.  Being physically inactive.    Eating a high-fat diet.  Having certain health conditions, such as: ? Diabetes. ? High blood pressure. ? Heart disease. ? Atherosclerosis. ? High cholesterol. ? Sickle cell disease.  Talk with your health care provider about your risk for cerebrovascular disease. Work with your health care provider to control diseases that you have that may contribute to cerebrovascular disease. Your health care provider may prescribe medicines to help prevent major causes of cerebrovascular disease. Where to find more information: Learn more about preventing cerebrovascular disease from:  National Heart, Lung, and  Blood Institute: www.nhlbi.nih.gov/health/health-topics/topics/stroke  Centers for Disease Control and Prevention: cdc.gov/stroke/about.htm  Summary  Cerebrovascular disease can lead to a stroke.  Atherosclerosis and high blood pressure are major causes of cerebrovascular disease.  Making diet and lifestyle changes can reduce your risk of cerebrovascular disease.  Work with your health care provider to get your risk factors under control to reduce your risk of cerebrovascular disease. This information is not intended to replace advice given to you by your health care provider. Make sure you discuss any questions you have with your health care provider. Document Released: 03/27/2015 Document Revised: 09/30/2015 Document Reviewed: 03/27/2015 Elsevier Interactive Patient Education  2018 Elsevier Inc.  

## 2016-10-30 NOTE — Progress Notes (Signed)
Chief Complaint: Follow up Extracranial Carotid Artery Stenosis   History of Present Illness  [name]. is a 81 y.o. male patient of Dr. Donnetta Hutching who presents today for followup of his moderate to severe asymptomatic right internal carotid artery stenosis. He is right-handed. He denies any neurologic deficits specifically no amaurosis fugax, transient ischemic attack or stroke. He reports that he is also stable from a cardiac standpoint. He had his carotid stenosis initially discovered at the time of coronary bypass grafting in 2007 and has had serial ultrasounds since then.  Patient has not had previous carotid artery intervention. He denies tingling, weakness, or pain in either hand or arm, denies dizziness with raising arms above his head.  He denies claudication symptoms in his legs with walking, denies non healing wounds. He works out at a gym 3 days/week.  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   He has not had much swelling in his legs since he lost weight and has been taking furosemide.  New onset atrial fib March 2016, placed on Eliquis, converted to SR with medications about a month later, remains on Eliquis. He was started on furosemide for LE edema and since developing atrial fib; this has helped his LE edema and his breathing.  He had pneumonia January 2016.  Pt Diabetic: Yes, states his last A1C was between 6-7.0, states he has since improved his lifestyle habits Pt smoker: former smoker, quit in 1972   Past Medical History:  Diagnosis Date  . ANEMIA, HX OF   . Bronchial pneumonia Jan. 2016  . CAD, ARTERY BYPASS GRAFT   . CAROTID ARTERY STENOSIS   . Cataract   . DIABETES MELLITUS, TYPE II   . Diabetic retinal damage of right eye ([place]) 2015  . Diverticulosis   . HYPERCHOLESTEROLEMIA   . HYPERTENSION   . Myocardial infarction [facility]) June 2007  . New onset atrial fibrillation ([place]) 06/24/2014  . PULMONARY HYPERTENSION   . RENAL INSUFFICIENCY, CHRONIC    . UNSPECIFIED THROMBOCYTOPENIA     Social History Social History  Substance Use Topics  . Smoking status: Former Smoker    Types: Cigarettes    Quit date: 03/26/1970  . Smokeless tobacco: Never Used  . Alcohol use 0.6 oz/week    1 Glasses of wine per week    Family History Family History  Problem Relation Age of Onset  . Heart disease Father 25       Heart disease before age 65  . Heart attack Father   . Hypertension Father   . Hyperlipidemia Father   . Varicose Veins Father   . Stroke Father        ? not sure  . Colon cancer Maternal Aunt   . Colon cancer Maternal Uncle   . Diverticulitis Unknown   . Heart attack Unknown     Surgical History Past Surgical History:  Procedure Laterality Date  . CARDIAC CATHETERIZATION  06/24/2014  . CATARACT EXTRACTION  2012   right eye  . COLONOSCOPY    . CORONARY ARTERY BYPASS GRAFT      quad bypass/2007  . EYE SURGERY    . LEFT HEART CATHETERIZATION WITH CORONARY/GRAFT ANGIOGRAM [name] 06/24/2014   Procedure: LEFT HEART CATHETERIZATION WITH Beatrix Fetters;  Surgeon: Burnell Blanks, MD;  Location: [facility] CATH LAB;  Service: Cardiovascular;  Laterality: [name];  . POLYPECTOMY    . TONSILLECTOMY      No Known Allergies  Current Outpatient Prescriptions  Medication Sig Dispense  Refill  . apixaban (ELIQUIS) 5 MG TABS tablet Take 1 tablet (5 mg total) by mouth 2 (two) times daily. 60 tablet 11  . Ascorbic Acid (VITAMIN C) 1000 MG tablet Take 500 mg by mouth daily.     [name] aspirin 81 MG tablet Take 81 mg by mouth daily.      . Bevacizumab (AVASTIN) 100 MG/4ML SOLN as needed. EYE INJECTION    . calcium-vitamin D (OSCAL WITH D) 500-200 MG-UNIT per tablet Take 1 tablet by mouth daily. Reported on 04/26/2015    . Cholecalciferol (VITAMIN D3) 2000 UNITS TABS Take 1 tablet by mouth daily.     . Coenzyme Q10 (CO Q 10 PO) Take 300 mg by mouth daily.    . fish oil-omega-3 fatty acids 1000 MG capsule Take 1 g by mouth daily.      .  furosemide (LASIX) 20 MG tablet Take 20 mg by mouth daily.  0  . glipiZIDE (GLUCOTROL) 10 MG tablet Take 10 mg by mouth 2 (two) times daily before a meal.    . losartan (COZAAR) 50 MG tablet Take 1 tablet (50 mg total) by mouth daily. 90 tablet 3  . metFORMIN (GLUCOPHAGE) 500 MG tablet Take 2 tablets (1,000 mg total) by mouth 2 (two) times daily with a meal.    . metoprolol succinate (TOPROL-XL) 25 MG 24 hr tablet Take 1 tablet (25 mg total) by mouth daily. 90 tablet 3  . Multiple Vitamin (MULTI-DAY VITAMINS PO) Take 1 tablet by mouth daily. 1 tab daily    . omeprazole (PRILOSEC) 20 MG capsule Take 20 mg by mouth 2 (two) times daily before a meal.     . simvastatin (ZOCOR) 20 MG tablet Take 20 mg by mouth daily at 6 PM.     . Turmeric 450 MG CAPS Take 1,350 mg by mouth daily.    . vitamin E 1000 UNIT capsule Take 1,000 Units by mouth every other day.    . oxyCODONE-acetaminophen (PERCOCET/ROXICET) 5-325 MG tablet take 1 tablet by mouth every 6 hours if needed for pain  0   No current facility-administered medications for this visit.     Review of Systems : See HPI for pertinent positives and negatives.  Physical Examination  Vitals:   10/30/16 1224 10/30/16 1227  BP: 127/78 129/83  Pulse: 63   Resp: 18   Temp: (!) 97 F (36.1 C)   TempSrc: Oral   SpO2: 100%   Weight: 205 lb (93 kg)   Height: 6' (1.829 m)    Body mass index is 27.8 kg/m.  General: WDWN male in NAD GAIT:normal Eyes: PERRLA Pulmonary: Respirations are non-labored, CTAB, no rales, rhonchi, or wheezing.  Cardiac: Mostly regular rhythm with occasional premature contractions, controlled rate, no detected murmur.  VASCULAR EXAM Carotid Bruits Left Right   Negative Negative   Radial pulses are 2+ palpable and equal.     Gastrointestinal: soft, nontender, BS WNL,  no r/g,no palpable masses.  Musculoskeletal: No muscle atrophy/wasting. M/S 5/5 throughout, Extremities without ischemic changes. Trace bilateral pitting edema in both lower legs with mild chronic venous insufficiency changes.   Neurologic: A&O X 3; appropriate affect, speech is normal, CN 2-12 intact, Pain and light touch intact in extremities, Motor exam as listed above.     Assessment: Hamlet Lasecki. is a 81 y.o. male who has no history of stroke or TIA.   His atherosclerotic risk factors include currently controlled DM, remote history of smoking, CAD, and chronic  renal insufficiency.  He takes Eliquis for a history of atrial fib, takes a daily ASA and a statin. He stays physically active.   DATA Carotid Duplex (10/30/16): 60-79% right proximal internal carotid artery stenosis. 1-39% left proximal internal carotid artery stenosis.  Bilateral vertebral artery flow is antegrade.  Bilateral subclavian artery waveforms are normal.  No significant change noted when compared to the previous exams on 04-26-15, 10-25-15, and 05-01-16.    Plan: Follow-up in 27monthswith Carotid Duplex scan.   I discussed in depth with the patient the nature of atherosclerosis, and emphasized the importance of maximal medical management including strict control of blood pressure, blood glucose, and lipid levels, obtaining regular exercise, and continued cessation of smoking.  The patient is aware that without maximal medical management the underlying atherosclerotic disease process will progress, limiting the benefit of any interventions. The patient was given information about stroke prevention and what symptoms should prompt the patient to seek immediate medical care. Thank you for allowing [place] to participate in this patient's care.  Clemon Chambers, RN, MSN, FNP-C [facility] Office: [phone]  Clinic Physician: Early  10/30/16 12:32 PM

## 2016-10-31 NOTE — Addendum Note (Signed)
Addended by: Lianne Cure A on: 10/31/2016 10:20 AM   Modules accepted: Orders

## 2016-12-19 DIAGNOSIS — H2512 Age-related nuclear cataract, left eye: Secondary | ICD-10-CM | POA: Diagnosis not present

## 2016-12-19 DIAGNOSIS — H43812 Vitreous degeneration, left eye: Secondary | ICD-10-CM | POA: Diagnosis not present

## 2016-12-19 DIAGNOSIS — H35371 Puckering of macula, right eye: Secondary | ICD-10-CM | POA: Diagnosis not present

## 2016-12-19 DIAGNOSIS — E113593 Type 2 diabetes mellitus with proliferative diabetic retinopathy without macular edema, bilateral: Secondary | ICD-10-CM | POA: Diagnosis not present

## 2016-12-31 ENCOUNTER — Encounter: Payer: Self-pay | Admitting: Cardiovascular Disease

## 2016-12-31 DIAGNOSIS — E113513 Type 2 diabetes mellitus with proliferative diabetic retinopathy with macular edema, bilateral: Secondary | ICD-10-CM | POA: Diagnosis not present

## 2016-12-31 DIAGNOSIS — E119 Type 2 diabetes mellitus without complications: Secondary | ICD-10-CM | POA: Diagnosis not present

## 2016-12-31 DIAGNOSIS — H2512 Age-related nuclear cataract, left eye: Secondary | ICD-10-CM | POA: Diagnosis not present

## 2017-01-01 ENCOUNTER — Telehealth: Payer: Self-pay

## 2017-01-01 NOTE — Telephone Encounter (Signed)
Antiplatelet clearance not addressed by PharmDs, will route to pre op pool. She does take Eliquis and can continue this for cataract removal.

## 2017-01-01 NOTE — Telephone Encounter (Signed)
   [facility] Pre-operative Risk Assessment    Request for surgical clearance:  1. What type of surgery is being performed? Cataract extraction with intraocular lens implantation of the left eye.  2. When is this surgery scheduled? 02/07/2017  3. Are there any medications that need to be held prior to surgery and how long? Please advise as to whether patient needs to stop aspirin or not.    4. Practice name and name of physician performing surgery? [facility]    5. What is your office phone and fax number? P: [phone] F: [phone]  6. Anesthesia type (None, local, MAC, general) ? TOPICAL   Stephannie Peters 01/01/2017, 12:01 PM  _________________________________________________________________   (provider comments below)

## 2017-01-03 NOTE — Telephone Encounter (Signed)
   Chart reviewed as part of pre-operative protocol coverage. Given past medical history and time since last visit, based on ACC/AHA guidelines, Demetris Capell. would be at acceptable risk for the planned procedure without further cardiovascular testing.   Cataract extraction is low-risk. Typically ASA is not held for the procedure but if performing MD wishes to hold, he can for up to 5 days prior to the procedure.   Erma Heritage, PA-C 01/03/2017, 4:20 PM

## 2017-01-08 DIAGNOSIS — Z23 Encounter for immunization: Secondary | ICD-10-CM | POA: Diagnosis not present

## 2017-01-08 DIAGNOSIS — I2510 Atherosclerotic heart disease of native coronary artery without angina pectoris: Secondary | ICD-10-CM | POA: Diagnosis not present

## 2017-01-08 DIAGNOSIS — E782 Mixed hyperlipidemia: Secondary | ICD-10-CM | POA: Diagnosis not present

## 2017-01-08 DIAGNOSIS — I480 Paroxysmal atrial fibrillation: Secondary | ICD-10-CM | POA: Diagnosis not present

## 2017-01-08 DIAGNOSIS — R609 Edema, unspecified: Secondary | ICD-10-CM | POA: Diagnosis not present

## 2017-01-08 DIAGNOSIS — I10 Essential (primary) hypertension: Secondary | ICD-10-CM | POA: Diagnosis not present

## 2017-01-08 DIAGNOSIS — K219 Gastro-esophageal reflux disease without esophagitis: Secondary | ICD-10-CM | POA: Diagnosis not present

## 2017-01-08 DIAGNOSIS — E1122 Type 2 diabetes mellitus with diabetic chronic kidney disease: Secondary | ICD-10-CM | POA: Diagnosis not present

## 2017-01-08 DIAGNOSIS — N183 Chronic kidney disease, stage 3 (moderate): Secondary | ICD-10-CM | POA: Diagnosis not present

## 2017-01-08 DIAGNOSIS — Z1389 Encounter for screening for other disorder: Secondary | ICD-10-CM | POA: Diagnosis not present

## 2017-01-08 DIAGNOSIS — I429 Cardiomyopathy, unspecified: Secondary | ICD-10-CM | POA: Diagnosis not present

## 2017-01-10 ENCOUNTER — Ambulatory Visit (INDEPENDENT_AMBULATORY_CARE_PROVIDER_SITE_OTHER): Payer: PPO | Admitting: Cardiovascular Disease

## 2017-01-10 ENCOUNTER — Encounter: Payer: Self-pay | Admitting: Cardiovascular Disease

## 2017-01-10 VITALS — BP 116/70 | HR 77 | Ht 72.0 in | Wt 202.8 lb

## 2017-01-10 DIAGNOSIS — E7800 Pure hypercholesterolemia, unspecified: Secondary | ICD-10-CM

## 2017-01-10 DIAGNOSIS — I428 Other cardiomyopathies: Secondary | ICD-10-CM | POA: Diagnosis not present

## 2017-01-10 DIAGNOSIS — I10 Essential (primary) hypertension: Secondary | ICD-10-CM | POA: Diagnosis not present

## 2017-01-10 DIAGNOSIS — I6523 Occlusion and stenosis of bilateral carotid arteries: Secondary | ICD-10-CM | POA: Diagnosis not present

## 2017-01-10 DIAGNOSIS — I2510 Atherosclerotic heart disease of native coronary artery without angina pectoris: Secondary | ICD-10-CM | POA: Diagnosis not present

## 2017-01-10 MED ORDER — APIXABAN 5 MG PO TABS
5.0000 mg | ORAL_TABLET | Freq: Two times a day (BID) | ORAL | 11 refills | Status: DC
Start: 2017-01-10 — End: 2017-12-30

## 2017-01-10 NOTE — Patient Instructions (Addendum)
Medication Instructions:  Your physician recommends that you continue on your current medications as directed. Please refer to the Current Medication list given to you today.   Labwork: none  Testing/Procedures: Your physician has requested that you have an echocardiogram. Echocardiography is a painless test that uses sound waves to create images of your heart. It provides your doctor with information about the size and shape of your heart and how well your heart's chambers and valves are working. This procedure takes approximately one hour. There are no restrictions for this procedure.    Follow-Up: Your physician recommends that you schedule a follow-up appointment in: about 6-8 weeks. --scheduled for November 29,2018 at 9:40    Any Other Special Instructions Will Be Listed Below (If Applicable).     If you need a refill on your cardiac medications before your next appointment, please call your pharmacy.

## 2017-01-10 NOTE — Progress Notes (Signed)
Chief Complaint  Patient presents with  . Follow-up    CAD     History of Present Illness: 81 yo male with history of atrial fibrillation, ischemic cardiomyopathy, DM, HTN, HLD, CAD s/p 4V CABG in 2007, carotid artery disease here today for follow up. He underwent 4V CABG in 2007 (LIMA to LAD, SVG to OM, SVG sequential to PLA/PDA). Carotid artery disease followed in [facility]. Last carotid doppler August 2018 with 60-79% RICA stenosis, less than 24% LICA stenosis. Echo 06/17/14 showed LVEF=25-30%, akinesis of the inferoseptum and apex. No significant valve disease. I arranged a cardiac cath on 05/27/14 which showed severe native vessel CAD and 4 patent bypass grafts. He was noted to be in atrial fibrillation the day of the cardiac cath and was started on Eliquis. He has tolerated the Eliquis well. No bleeding events.   He is here today for follow up. The patient denies any chest pain, palpitations, orthopnea, PND, dizziness, near syncope or syncope. He does have fatigue that is slightly worse than before. He denies dyspnea with exertion. He has stable mild bilateral lower extremity edema.    Primary Care Physician: Shirline Frees, MD  Past Medical History:  Diagnosis Date  . ANEMIA, HX OF   . Bronchial pneumonia Jan. 2016  . CAD, ARTERY BYPASS GRAFT   . CAROTID ARTERY STENOSIS   . Cataract   . DIABETES MELLITUS, TYPE II   . Diabetic retinal damage of right eye ([place]) 2015  . Diverticulosis   . HYPERCHOLESTEROLEMIA   . HYPERTENSION   . Myocardial infarction [facility]) June 2007  . New onset atrial fibrillation ([place]) 06/24/2014  . PULMONARY HYPERTENSION   . RENAL INSUFFICIENCY, CHRONIC   . UNSPECIFIED THROMBOCYTOPENIA     Past Surgical History:  Procedure Laterality Date  . CARDIAC CATHETERIZATION  06/24/2014  . CATARACT EXTRACTION  2012   right eye  . COLONOSCOPY    . CORONARY ARTERY BYPASS GRAFT      quad bypass/2007  . EYE SURGERY    . LEFT HEART CATHETERIZATION WITH CORONARY/GRAFT  ANGIOGRAM [name] 06/24/2014   Procedure: LEFT HEART CATHETERIZATION WITH Beatrix Fetters;  Surgeon: Burnell Blanks, MD;  Location: [facility] CATH LAB;  Service: Cardiovascular;  Laterality: [name];  . POLYPECTOMY    . TONSILLECTOMY      Current Outpatient Prescriptions  Medication Sig Dispense Refill  . apixaban (ELIQUIS) 5 MG TABS tablet Take 1 tablet (5 mg total) by mouth 2 (two) times daily. 60 tablet 11  . Ascorbic Acid (VITAMIN C) 1000 MG tablet Take 500 mg by mouth daily.     [name] aspirin 81 MG tablet Take 81 mg by mouth daily.      . Bevacizumab (AVASTIN) 100 MG/4ML SOLN as needed. EYE INJECTION    . calcium-vitamin D (OSCAL WITH D) 500-200 MG-UNIT per tablet Take 1 tablet by mouth daily. Reported on 04/26/2015    . Cholecalciferol (VITAMIN D3) 2000 UNITS TABS Take 1 tablet by mouth daily.     . Coenzyme Q10 (CO Q 10 PO) Take 300 mg by mouth daily.    . fish oil-omega-3 fatty acids 1000 MG capsule Take 1 g by mouth daily.      . furosemide (LASIX) 20 MG tablet Take 20 mg by mouth daily.  0  . glipiZIDE (GLUCOTROL) 10 MG tablet Take 10 mg by mouth 2 (two) times daily before a meal.    . losartan (COZAAR) 50 MG tablet Take 1 tablet (50 mg total) by mouth daily.  90 tablet 3  . metFORMIN (GLUCOPHAGE) 500 MG tablet Take 2 tablets (1,000 mg total) by mouth 2 (two) times daily with a meal.    . metoprolol succinate (TOPROL-XL) 25 MG 24 hr tablet Take 1 tablet (25 mg total) by mouth daily. 90 tablet 3  . Multiple Vitamin (MULTI-DAY VITAMINS PO) Take 1 tablet by mouth daily. 1 tab daily    . omeprazole (PRILOSEC) 20 MG capsule Take 20 mg by mouth 2 (two) times daily before a meal.     . simvastatin (ZOCOR) 20 MG tablet Take 20 mg by mouth daily at 6 PM.     . Turmeric 450 MG CAPS Take 1,350 mg by mouth daily.    . vitamin E 1000 UNIT capsule Take 1,000 Units by mouth every other day.     No current facility-administered medications for this visit.     No Known Allergies  Social History     Social History  . Marital status: Married    Spouse name: [name]  . Number of children: 2  . Years of education: [name]   Occupational History  . Retired-sales Retired   Social History Main Topics  . Smoking status: Former Smoker    Types: Cigarettes    Quit date: 03/26/1970  . Smokeless tobacco: Never Used  . Alcohol use 0.6 oz/week    1 Glasses of wine per week  . Drug use: No  . Sexual activity: Not on file   Other Topics Concern  . Not on file   Social History Narrative  . No narrative on file    Family History  Problem Relation Age of Onset  . Heart disease Father 58       Heart disease before age 55  . Heart attack Father   . Hypertension Father   . Hyperlipidemia Father   . Varicose Veins Father   . Stroke Father        ? not sure  . Colon cancer Maternal Aunt   . Colon cancer Maternal Uncle   . Diverticulitis Unknown   . Heart attack Unknown     Review of Systems:  As stated in the HPI and otherwise negative.   BP 116/70   Pulse 77   Ht 6' (1.829 m)   Wt 202 lb 12.8 oz (92 kg)   SpO2 98%   BMI 27.50 kg/m   Physical Examination:  General: Well developed, well nourished, NAD  HEENT: OP clear, mucus membranes moist  SKIN: warm, dry. No rashes. Neuro: No focal deficits  Musculoskeletal: Muscle strength 5/5 all ext  Psychiatric: Mood and affect normal  Neck: No JVD, no carotid bruits, no thyromegaly, no lymphadenopathy.  Lungs:Clear bilaterally, no wheezes, rhonci, crackles Cardiovascular: Irreg irreg. No murmurs, gallops or rubs. Abdomen:Soft. Bowel sounds present. Non-tender.  Extremities: Trace bilateral lower extremity edema. Pulses are 2 + in the bilateral DP/PT.  Echo 06/17/14: Left ventricle: The cavity size was normal. Systolic function was severely reduced. The estimated ejection fraction was in the range of 25% to 30%. Diffuse hypokinesis. There is hypokinesis of the inferoseptal and apical myocardium. Features are consistent with  a pseudonormal left ventricular filling pattern, with concomitant abnormal relaxation and increased filling pressure (grade 2 diastolic dysfunction). - Mitral valve: Moderately calcified annulus. - Left atrium: The atrium was mildly dilated. - Right ventricle: The cavity size was mildly dilated. Wall thickness was normal. - Right atrium: The atrium was mildly dilated. - Pulmonary arteries: Systolic pressure was moderately increased. PA  peak pressure: 51 mm Hg (S). Impressions: - EF is reduced when compared to prior echocardiogram.  Cardiac cath 06/24/14: Hemodynamic Findings: Central aortic pressure: 124/62 Left ventricular pressure: 130/3/9 Angiographic Findings: Left main: 30% mid stenosis.  Left Anterior Descending Artery: Large caliber diffusely diseased vessel that courses to the apex. The proximal vessel has a 95% stenosis. There is a small caliber diagonal branch that is supplied by the proximal LAD.The mid LAD is occluded. The mid and distal LAD fills from the patent IMA graft. There is a moderate caliber septal perforating branch.  Circumflex Artery: Moderate caliber vessel with proximal 80% stenosis, 100% mid occlusion. The obtuse marginal branch is occluded and fills from the patent vein graft.  Right Coronary Artery: Large dominant vessel with diffuse 30% proximal stenosis, 95% mid stenosis, diffuse 30% distal stenosis. There is diffuse 90% distal stenosis just before the bifurcation. The PLA and PDA fill antegrade and from the patent sequential vein grafts.  Graft Anatomy:  SVG to OM is patent. There are two valves in the body of the vein graft and the appearance of this graft is unchanged from last cath in 2010.  SVG sequential to PLA/PDA is patent LIMA to mid LAD is patent. The target LAD vessel is small and diffusely diseased.  Left Ventricular Angiogram: Deferred.   EKG:  EKG is not ordered today. The ekg ordered today demonstrates   Recent Labs: No results  found for requested labs within last 8760 hours.   Lipid Panel  Followed in primary care   Wt Readings from Last 3 Encounters:  01/10/17 202 lb 12.8 oz (92 kg)  10/30/16 205 lb (93 kg)  07/06/16 202 lb 12.8 oz (92 kg)    Other studies Reviewed: Additional studies/ records that were reviewed today include: . Review of the above records demonstrates:  Assessment and Plan:   1. Coronary artery disease without angina: No chest pain suggestive of angina. Will continue ASA, statin, beta blocker and Ace-inh.    2. Hyperlipidemia:  Continue statin. Lipids followed in primary care. (LDL 71 in April 2018)  3. Carotid artery disease: Followed in [facility] and stable by dopplers August 2018.   4. HTN: BP controlled. No changes.   5. Ischemic Cardiomyopathy: He has severe CAD but bypasses stable at cath in 2016. LV systolic function has waxed and waned over the years. His LVEF was 30% prior to his bypass in 2007 and improved to 60% post bypass. Most recent echo March 2016 with LVEF 25-30%. He had been class NYHA class 1 so ICD had not been considered. He is now NYHA class 2. Will repeat echo now. I will see him back in 6 weeks. If LVEF still reduced, will consider addition of Entresto and consideration for ICD for primary prevention.   6. Atrial fibrillation, persistent: Rate is controlled on Toprol. Amiodarone stopped due to fatigue. Will continue Eliquis for anti-coagulation.    Current medicines are reviewed at length with the patient today.  The patient does not have concerns regarding medicines.  The following changes have been made:   Labs/ tests ordered today include:   Orders Placed This Encounter  Procedures  . ECHOCARDIOGRAM COMPLETE    Disposition:   FU with me in 6  Weeks.    Signed, [name], MD 01/10/2017 3:37 PM    [facility] [address], [place], [place]  [address] Phone: [phone]; Fax: [phone]

## 2017-01-17 ENCOUNTER — Ambulatory Visit (HOSPITAL_COMMUNITY): Payer: PPO | Attending: Cardiovascular Disease

## 2017-01-17 ENCOUNTER — Other Ambulatory Visit: Payer: Self-pay

## 2017-01-17 DIAGNOSIS — I428 Other cardiomyopathies: Secondary | ICD-10-CM | POA: Diagnosis not present

## 2017-01-17 DIAGNOSIS — E1122 Type 2 diabetes mellitus with diabetic chronic kidney disease: Secondary | ICD-10-CM | POA: Diagnosis not present

## 2017-01-17 DIAGNOSIS — I1310 Hypertensive heart and chronic kidney disease without heart failure, with stage 1 through stage 4 chronic kidney disease, or unspecified chronic kidney disease: Secondary | ICD-10-CM | POA: Insufficient documentation

## 2017-01-17 DIAGNOSIS — I2510 Atherosclerotic heart disease of native coronary artery without angina pectoris: Secondary | ICD-10-CM | POA: Diagnosis not present

## 2017-01-17 DIAGNOSIS — N183 Chronic kidney disease, stage 3 (moderate): Secondary | ICD-10-CM | POA: Insufficient documentation

## 2017-01-17 DIAGNOSIS — I059 Rheumatic mitral valve disease, unspecified: Secondary | ICD-10-CM | POA: Diagnosis not present

## 2017-01-17 DIAGNOSIS — R29898 Other symptoms and signs involving the musculoskeletal system: Secondary | ICD-10-CM | POA: Insufficient documentation

## 2017-01-17 MED ORDER — PERFLUTREN LIPID MICROSPHERE
1.0000 mL | INTRAVENOUS | Status: AC | PRN
  Administered 2017-01-17: 2 mL via INTRAVENOUS

## 2017-02-04 ENCOUNTER — Telehealth: Payer: Self-pay | Admitting: Cardiovascular Disease

## 2017-02-04 NOTE — Telephone Encounter (Signed)
Patient calling, states that he has a few questions to ask

## 2017-02-04 NOTE — Telephone Encounter (Signed)
Lm to cb.

## 2017-02-04 NOTE — Telephone Encounter (Signed)
Pt calling because he is scheduled for a cataract surgery on Thursday and wants to know if he should hold his Eliquis.  Advised his surgeon should instruct him on whether or not he wants him to hold it however generally Eliquis is not held prior to this surgery.  If MD wants him to hold this, his office should contact Dr Angelena Form for instructions.  Pt states understanding.  Reassurance given in reference to his recent echocardiogram.  He will discuss at his visit with Dr C. McAlhany as scheduled.

## 2017-02-07 DIAGNOSIS — H2512 Age-related nuclear cataract, left eye: Secondary | ICD-10-CM | POA: Diagnosis not present

## 2017-02-20 NOTE — Progress Notes (Signed)
Chief Complaint  Patient presents with  . Coronary Artery Disease     History of Present Illness: 81 yo male with history of atrial fibrillation, ischemic cardiomyopathy, DM, HTN, HLD, CAD s/p 4V CABG in 2007, carotid artery disease here today for follow up. He underwent 4V CABG in 2007 (LIMA to LAD, SVG to OM, SVG sequential to PLA/PDA). Carotid artery disease followed in [facility]. Last carotid doppler August 2018 with 60-79% RICA stenosis, less than 84% LICA stenosis. Echo 06/17/14 showed LVEF=25-30%, akinesis of the inferoseptum and apex. No significant valve disease. I arranged a cardiac cath on 05/27/14 which showed severe native vessel CAD and 4 patent bypass grafts. He was noted to be in atrial fibrillation the day of the cardiac cath and was started on Eliquis. He has tolerated the Eliquis well. No bleeding events. I saw him 01/10/17 and he c/o dyspnea with exertion and worsened fatigue. Echo 01/17/17 with LVEF=25%, diffuse hypokinesis of the septal and apical walls. The LV cavity is severely dilated. Severe MAC. Bilateral atrial enlargement. PA pressure 56 mmHg.   He is here today for follow up. The patient denies any chest pain, palpitations, lower extremity edema, orthopnea, PND, dizziness, near syncope or syncope. He endorses continued dyspnea on exertion and fatigue. No resting dyspnea. Weight stable at home. Still has trivial LE edema.     Primary Care Physician: Shirline Frees, MD  Past Medical History:  Diagnosis Date  . ANEMIA, HX OF   . Bronchial pneumonia Jan. 2016  . CAD, ARTERY BYPASS GRAFT   . CAROTID ARTERY STENOSIS   . Cataract   . DIABETES MELLITUS, TYPE II   . Diabetic retinal damage of right eye ([place]) 2015  . Diverticulosis   . HYPERCHOLESTEROLEMIA   . HYPERTENSION   . Myocardial infarction [facility]) June 2007  . New onset atrial fibrillation ([place]) 06/24/2014  . PULMONARY HYPERTENSION   . RENAL INSUFFICIENCY, CHRONIC   . UNSPECIFIED THROMBOCYTOPENIA     Past Surgical  History:  Procedure Laterality Date  . CARDIAC CATHETERIZATION  06/24/2014  . CATARACT EXTRACTION  2012   right eye  . COLONOSCOPY    . CORONARY ARTERY BYPASS GRAFT      quad bypass/2007  . EYE SURGERY    . LEFT HEART CATHETERIZATION WITH CORONARY/GRAFT ANGIOGRAM [name] 06/24/2014   Procedure: LEFT HEART CATHETERIZATION WITH Beatrix Fetters;  Surgeon: Burnell Blanks, MD;  Location: [facility] CATH LAB;  Service: Cardiovascular;  Laterality: [name];  . POLYPECTOMY    . TONSILLECTOMY      Current Outpatient Medications  Medication Sig Dispense Refill  . apixaban (ELIQUIS) 5 MG TABS tablet Take 1 tablet (5 mg total) by mouth 2 (two) times daily. 60 tablet 11  . Ascorbic Acid (VITAMIN C) 1000 MG tablet Take 500 mg by mouth daily.     [name] aspirin 81 MG tablet Take 81 mg by mouth daily.      . Bevacizumab (AVASTIN) 100 MG/4ML SOLN as needed. EYE INJECTION    . calcium-vitamin D (OSCAL WITH D) 500-200 MG-UNIT per tablet Take 1 tablet by mouth daily. Reported on 04/26/2015    . Cholecalciferol (VITAMIN D3) 2000 UNITS TABS Take 1 tablet by mouth daily.     . Coenzyme Q10 (CO Q 10 PO) Take 300 mg by mouth daily.    . fish oil-omega-3 fatty acids 1000 MG capsule Take 1 g by mouth daily.      . furosemide (LASIX) 20 MG tablet Take 20 mg by mouth daily.  0  . glipiZIDE (GLUCOTROL) 10 MG tablet Take 10 mg by mouth 2 (two) times daily before a meal.    . metFORMIN (GLUCOPHAGE) 500 MG tablet Take 2 tablets (1,000 mg total) by mouth 2 (two) times daily with a meal.    . metoprolol succinate (TOPROL-XL) 25 MG 24 hr tablet Take 1 tablet (25 mg total) by mouth daily. 90 tablet 3  . Multiple Vitamin (MULTI-DAY VITAMINS PO) Take 1 tablet by mouth daily. 1 tab daily    . omeprazole (PRILOSEC) 20 MG capsule Take 20 mg by mouth 2 (two) times daily before a meal.     . simvastatin (ZOCOR) 20 MG tablet Take 20 mg by mouth daily at 6 PM.     . Turmeric 450 MG CAPS Take by mouth daily.     . vitamin E 1000 UNIT  capsule Take 1,000 Units by mouth every other day.    . sacubitril-valsartan (ENTRESTO) 24-26 MG Take 1 tablet by mouth 2 (two) times daily. 60 tablet 3   No current facility-administered medications for this visit.     No Known Allergies  Social History   Socioeconomic History  . Marital status: Married    Spouse name: Not on file  . Number of children: 2  . Years of education: Not on file  . Highest education level: Not on file  Social Needs  . Financial resource strain: Not on file  . Food insecurity - worry: Not on file  . Food insecurity - inability: Not on file  . Transportation needs - medical: Not on file  . Transportation needs - non-medical: Not on file  Occupational History  . Occupation: Horticulturist, commercial: RETIRED  Tobacco Use  . Smoking status: Former Smoker    Types: Cigarettes    Last attempt to quit: 03/26/1970    Years since quitting: 46.9  . Smokeless tobacco: Never Used  Substance and Sexual Activity  . Alcohol use: Yes    Alcohol/week: 0.6 oz    Types: 1 Glasses of wine per week  . Drug use: No  . Sexual activity: Not on file  Other Topics Concern  . Not on file  Social History Narrative  . Not on file    Family History  Problem Relation Age of Onset  . Heart disease Father 43       Heart disease before age 52  . Heart attack Father   . Hypertension Father   . Hyperlipidemia Father   . Varicose Veins Father   . Stroke Father        ? not sure  . Colon cancer Maternal Aunt   . Colon cancer Maternal Uncle   . Diverticulitis Unknown   . Heart attack Unknown     Review of Systems:  As stated in the HPI and otherwise negative.   BP 124/68   Pulse 69   Ht 6' (1.829 m)   Wt 205 lb (93 kg)   SpO2 97%   BMI 27.80 kg/m   Physical Examination:  General: Well developed, well nourished, NAD  HEENT: OP clear, mucus membranes moist  SKIN: warm, dry. No rashes. Neuro: No focal deficits  Musculoskeletal: Muscle strength 5/5 all ext    Psychiatric: Mood and affect normal  Neck: No JVD, no carotid bruits, no thyromegaly, no lymphadenopathy.  Lungs:Clear bilaterally, no wheezes, rhonci, crackles Cardiovascular: Regular rate and rhythm. No murmurs, gallops or rubs. Abdomen:Soft. Bowel sounds present. Non-tender.  Extremities: Trace lower extremity edema.  Pulses are 2 + in the bilateral DP/PT.  Echo 01/17/17: Left ventricle: Diffuse hypokinesis with septal and apical   akinesis LV apical band no thrombus. The cavity size was severely   dilated. Wall thickness was normal. The estimated ejection   fraction was 25%. Doppler parameters are consistent with both   elevated ventricular end-diastolic filling pressure and elevated   left atrial filling pressure. - Mitral valve: Severely calcified annulus. - Left atrium: The atrium was moderately dilated. - Right atrium: The atrium was moderately dilated. - Atrial septum: No defect or patent foramen ovale was identified. - Pulmonary arteries: PA peak pressure: 56 mm Hg (S).  Cardiac cath 06/24/14: Hemodynamic Findings: Central aortic pressure: 124/62 Left ventricular pressure: 130/3/9 Angiographic Findings: Left main: 30% mid stenosis.  Left Anterior Descending Artery: Large caliber diffusely diseased vessel that courses to the apex. The proximal vessel has a 95% stenosis. There is a small caliber diagonal branch that is supplied by the proximal LAD.The mid LAD is occluded. The mid and distal LAD fills from the patent IMA graft. There is a moderate caliber septal perforating branch.  Circumflex Artery: Moderate caliber vessel with proximal 80% stenosis, 100% mid occlusion. The obtuse marginal branch is occluded and fills from the patent vein graft.  Right Coronary Artery: Large dominant vessel with diffuse 30% proximal stenosis, 95% mid stenosis, diffuse 30% distal stenosis. There is diffuse 90% distal stenosis just before the bifurcation. The PLA and PDA fill antegrade and from  the patent sequential vein grafts.  Graft Anatomy:  SVG to OM is patent. There are two valves in the body of the vein graft and the appearance of this graft is unchanged from last cath in 2010.  SVG sequential to PLA/PDA is patent LIMA to mid LAD is patent. The target LAD vessel is small and diffusely diseased.  Left Ventricular Angiogram: Deferred.   EKG:  EKG is not ordered today. The ekg ordered today demonstrates   Recent Labs: No results found for requested labs within last 8760 hours.   Lipid Panel  Followed in primary care   Wt Readings from Last 3 Encounters:  02/21/17 205 lb (93 kg)  01/10/17 202 lb 12.8 oz (92 kg)  10/30/16 205 lb (93 kg)    Other studies Reviewed: Additional studies/ records that were reviewed today include: . Review of the above records demonstrates:  Assessment and Plan:   1. Coronary artery disease without angina: He has no symptoms that are suggestive of angina. Will continue ASA, statin, beta blocker.   2. Hyperlipidemia:  Will continue statin.   3. Carotid artery disease: Followed in [facility] and stable by dopplers August 2018.   4. HTN: BP controlled. No changes.   5. Ischemic Cardiomyopathy/Chronic systolic CHF: He has severe CAD but bypasses stable at cath in 2016. LV systolic function has waxed and waned over the years. His LVEF was 30% prior to his bypass in 2007 and improved to 60% post bypass. Most recent echo October 2018 with LVEF=25%. He has NYHA class 2 symptoms. I will add Entresto 24/26 once BID. BMET today and in one week. He will f/u in 2 weeks with pharmacy to titrate dose of Entresto. Referral to EP for consideration for ICD given class 2 CHF symptoms and LVEF below 30%. May add aldactone at next visit if stable renal function on Entresto. Volume status and weight stable. Continue Lasix 20 mg daily.   6. Atrial fibrillation, persistent: Rate controlled on Toprol. Will continue Eliquis. Amiodarone was  stopped due to fatigue.      Current medicines are reviewed at length with the patient today.  The patient does not have concerns regarding medicines.  The following changes have been made:   Labs/ tests ordered today include:   Orders Placed This Encounter  Procedures  . Basic Metabolic Panel (BMET)    Disposition:   FU with me in 3 months.     Signed, [name], MD 02/21/2017 10:20 AM    [facility] [place], [place], [place]  [address] Phone: [phone]; Fax: [phone]

## 2017-02-21 ENCOUNTER — Ambulatory Visit: Payer: PPO | Admitting: Cardiovascular Disease

## 2017-02-21 ENCOUNTER — Encounter: Payer: Self-pay | Admitting: Cardiovascular Disease

## 2017-02-21 VITALS — BP 124/68 | HR 69 | Ht 72.0 in | Wt 205.0 lb

## 2017-02-21 DIAGNOSIS — I6523 Occlusion and stenosis of bilateral carotid arteries: Secondary | ICD-10-CM | POA: Diagnosis not present

## 2017-02-21 DIAGNOSIS — I5022 Chronic systolic (congestive) heart failure: Secondary | ICD-10-CM

## 2017-02-21 DIAGNOSIS — I481 Persistent atrial fibrillation: Secondary | ICD-10-CM

## 2017-02-21 DIAGNOSIS — I255 Ischemic cardiomyopathy: Secondary | ICD-10-CM

## 2017-02-21 DIAGNOSIS — I10 Essential (primary) hypertension: Secondary | ICD-10-CM

## 2017-02-21 DIAGNOSIS — I2510 Atherosclerotic heart disease of native coronary artery without angina pectoris: Secondary | ICD-10-CM

## 2017-02-21 DIAGNOSIS — I4819 Other persistent atrial fibrillation: Secondary | ICD-10-CM

## 2017-02-21 LAB — BASIC METABOLIC PANEL
BUN/Creatinine Ratio: 24 (ref 10–24)
BUN: 33 mg/dL — ABNORMAL HIGH (ref 8–27)
CO2: 23 mmol/L (ref 20–29)
Calcium: 9.4 mg/dL (ref 8.6–10.2)
Chloride: 98 mmol/L (ref 96–106)
Creatinine, Ser: 1.35 mg/dL — ABNORMAL HIGH (ref 0.76–1.27)
GFR calc Af Amer: 57 mL/min/{1.73_m2} — ABNORMAL LOW (ref >59)
GFR calc non Af Amer: 50 mL/min/{1.73_m2} — ABNORMAL LOW (ref >59)
Glucose: 177 mg/dL — ABNORMAL HIGH (ref 65–99)
Potassium: 5.1 mmol/L (ref 3.5–5.2)
Sodium: 137 mmol/L (ref 134–144)

## 2017-02-21 MED ORDER — SACUBITRIL-VALSARTAN 24-26 MG PO TABS
1.0000 | ORAL_TABLET | Freq: Two times a day (BID) | ORAL | 3 refills | Status: DC
Start: 2017-02-21 — End: 2017-05-27

## 2017-02-21 NOTE — Patient Instructions (Addendum)
Medication Instructions:  Your physician has recommended you make the following change in your medication:  Stop Cozaar (losartan) Start Entresto 24/26 mg by mouth twice daily. Start tomorrow   Labwork: Lab work to be done today--BMP  Testing/Procedures: none  Follow-Up:  Please schedule patient for new patient appointment with Dr. Curt Bears  Your physician recommends that you schedule a follow-up appointment in: 10 days with pharmacist to follow up on [facility]  Your physician recommends that you schedule a follow-up appointment in: 3 months with Dr. Angelena Form    Any Other Special Instructions Will Be Listed Below (If Applicable).     If you need a refill on your cardiac medications before your next appointment, please call your pharmacy.

## 2017-03-06 ENCOUNTER — Telehealth: Payer: Self-pay

## 2017-03-06 NOTE — Telephone Encounter (Signed)
**Note De-identified  Obfuscation** I have done an Entresto PA through covermymeds. 

## 2017-03-07 NOTE — Telephone Encounter (Signed)
[facility] requesting further clinical information to be faxed to them for PA ENTRESTO, I have faxed with confirmation.

## 2017-03-08 ENCOUNTER — Ambulatory Visit: Payer: PPO | Admitting: Cardiology

## 2017-03-08 ENCOUNTER — Encounter: Payer: Self-pay | Admitting: Cardiology

## 2017-03-08 VITALS — BP 124/60 | HR 89 | Ht 72.0 in | Wt 200.0 lb

## 2017-03-08 DIAGNOSIS — I10 Essential (primary) hypertension: Secondary | ICD-10-CM | POA: Diagnosis not present

## 2017-03-08 DIAGNOSIS — I481 Persistent atrial fibrillation: Secondary | ICD-10-CM

## 2017-03-08 DIAGNOSIS — I25810 Atherosclerosis of coronary artery bypass graft(s) without angina pectoris: Secondary | ICD-10-CM | POA: Diagnosis not present

## 2017-03-08 DIAGNOSIS — I5022 Chronic systolic (congestive) heart failure: Secondary | ICD-10-CM | POA: Diagnosis not present

## 2017-03-08 DIAGNOSIS — I255 Ischemic cardiomyopathy: Secondary | ICD-10-CM

## 2017-03-08 DIAGNOSIS — Z79899 Other long term (current) drug therapy: Secondary | ICD-10-CM

## 2017-03-08 DIAGNOSIS — I4819 Other persistent atrial fibrillation: Secondary | ICD-10-CM

## 2017-03-08 NOTE — Patient Instructions (Addendum)
Medication Instructions:  Your physician recommends that you continue on your current medications as directed. Please refer to the Current Medication list given to you today.     * If you need a refill on your cardiac medications before your next appointment, please call your pharmacy. *  Labwork: Your physician recommends that you return for lab work in: 2 weeks for BMET  Testing/Procedures: Your physician has recommended that you have a defibrillator inserted. An implantable cardioverter defibrillator (ICD) is a small device that is placed in your chest or, in rare cases, your abdomen. This device uses electrical pulses or shocks to help control life-threatening, irregular heartbeats that could lead the heart to suddenly stop beating (sudden cardiac arrest). Leads are attached to the ICD that goes into your heart. This is done in the hospital and usually requires an overnight stay.  Jayleigh Notarianni, RN will call you to arrange this procedure.  Follow-Up: To be determined once procedure is scheduled.  Thank you for choosing [facility]!!   Trinidad Curet, RN [phone]  Any Other Special Instructions Will Be Listed Below (If Applicable).   Cardioverter Defibrillator Implantation An implantable cardioverter defibrillator (ICD) is a small, lightweight, battery-powered device that is placed (implanted) under the skin in the chest or abdomen. Your caregiver may prescribe an ICD if:  You have had an irregular heart rhythm (arrhythmia) that originated in the lower chambers of the heart (ventricles).  Your heart has been damaged by a disease (such as coronary artery disease) or heart condition (such as a heart attack). An ICD consists of a battery that lasts several years, a small computer called a pulse generator, and wires called leads that go into the heart. It is used to detect and correct two dangerous arrhythmias: a rapid heart rhythm (tachycardia) and an arrhythmia in which the ventricles  contract in an uncoordinated way (fibrillation). When an ICD detects tachycardia, it sends an electrical signal to the heart that restores the heartbeat to normal (cardioversion). This signal is usually painless. If cardioversion does not work or if the ICD detects fibrillation, it delivers a small electrical shock to the heart (defibrillation) to restart the heart. The shock may feel like a strong jolt in the chest.ICDs may be programmed to correct other problems. Sometimes, ICDs are programmed to act as another type of implantable device called a pacemaker. Pacemakers are used to treat a slow heartbeat (bradycardia). LET YOUR CAREGIVER KNOW ABOUT:  Any allergies you have.  All medicines you are taking, including vitamins, herbs, eyedrops, and over-the-counter medicines and creams.  Previous problems you or members of your family have had with the use of anesthetics.  Any blood disorders you have had.  Other health problems you have. RISKS AND COMPLICATIONS Generally, the procedure to implant an ICD is safe. However, as with any surgical procedure, complications can occur. Possible complications associated with implanting an ICD include:  Swelling, bleeding, or bruising at the site where the ICD was implanted.  Infection at the site where the ICD was implanted.  A reaction to medicine used during the procedure.  Nerve, heart, or blood vessel damage.  Blood clots. BEFORE THE PROCEDURE  You may need to have blood tests, heart tests, or a chest X-ray done before the day of the procedure.  Ask your caregiver about changing or stopping your regular medicines.  Make plans to have someone drive you home. You may need to stay in the hospital overnight after the procedure.  Stop smoking at least 24  hours before the procedure.  Take a bath or shower the night before the procedure. You may need to scrub your chest or abdomen with a special type of soap.  Do not eat or drink before your  procedure for as long as directed by your caregiver. Ask if it is okay to take any needed medicine with a small sip of water. PROCEDURE  The procedure to implant an ICD in your chest or abdomen is usually done at a hospital in a room that has a large X-ray machine called a fluoroscope. The machine will be above you during the procedure. It will help your caregiver see your heart during the procedure. Implanting an ICD usually takes 1-3 hours. Before the procedure:   Small monitors will be put on your body. They will be used to check your heart, blood pressure, and oxygen level.  A needle will be put into a vein in your hand or arm. This is called an intravenous (IV) access tube. Fluids and medicine will flow directly into your body through the IV tube.  Your chest or abdomen will be cleaned with a germ-killing (antiseptic) solution. The area may be shaved.  You may be given medicine to help you relax (sedative).  You will be given a medicine called a local anesthetic. This medicine will make the surgical site numb while the ICD is implanted. You will be sleepy but awake during the procedure. After you are numb the procedure will begin. The caregiver will:  Make a small cut (incision). This will make a pocket deep under your skin that will hold the pulse generator.  Guide the leads through a large blood vessel into your heart and attach them to the heart muscles. Depending on the ICD, the leads may go into one ventricle or they may go to both ventricles and into an upper chamber of the heart (atrium).  Test the ICD.  Close the incision with stitches, glue, or staples. AFTER THE PROCEDURE  You may feel pain. Some pain is normal. It may last a few days.  You may stay in a recovery area until the local anesthetic has worn off. Your blood pressure and pulse will be checked often. You will be taken to a room where your heart will be monitored.  A chest X-ray will be taken. This is done to check  that the cardioverter defibrillator is in the right place.  You may stay in the hospital overnight.  A slight bump may be seen over the skin where the ICD was placed. Sometimes, it is possible to feel the ICD under the skin. This is normal.  In the months and years afterward, your caregiver will check the device, the leads, and the battery every few months. Eventually, when the battery is low, the ICD will be replaced.   This information is not intended to replace advice given to you by your health care provider. Make sure you discuss any questions you have with your health care provider.   Document Released: 12/02/2001 Document Revised: 12/31/2012 Document Reviewed: 03/31/2012 Elsevier Interactive Patient Education [employer].

## 2017-03-08 NOTE — Progress Notes (Signed)
Electrophysiology Office Note   Date:  03/08/2017   ID:  [name]., DOB May 01, 1935, MRN [id number]  PCP:  Shirline Frees, MD  Cardiologist:  Angelena Form Primary Electrophysiologist:  Aleczander Fandino Meredith Leeds, MD    Chief Complaint  Patient presents with  . Congestive Heart Failure     History of Present Illness: [name]. is a 81 y.o. male who is being seen today for the evaluation of ischemic cardiomyopathy at the request of [name]. Presenting today for electrophysiology evaluation.  Has a history of atrial fibrillation, ischemic cardiomyopathy, diabetes, hypertension, hyperlipidemia, coronary disease status post four-vessel CABG in 2007, carotid artery disease.  He has had a low ejection fractions in 2016.  In 2016 showed severe native vessel disease with 4 patent bypass grafts.  He was started on Eliquis due to atrial fibrillation.   Today, he denies symptoms of palpitations, chest pain, shortness of breath, orthopnea, PND, lower extremity edema, claudication, dizziness, presyncope, syncope, bleeding, or neurologic sequela. The patient is tolerating medications without difficulties.    Past Medical History:  Diagnosis Date  . ANEMIA, HX OF   . Bronchial pneumonia Jan. 2016  . CAD, ARTERY BYPASS GRAFT   . CAROTID ARTERY STENOSIS   . Cataract   . DIABETES MELLITUS, TYPE II   . Diabetic retinal damage of right eye ([place]) 2015  . Diverticulosis   . HYPERCHOLESTEROLEMIA   . HYPERTENSION   . Myocardial infarction [facility]) June 2007  . New onset atrial fibrillation ([place]) 06/24/2014  . PULMONARY HYPERTENSION   . RENAL INSUFFICIENCY, CHRONIC   . UNSPECIFIED THROMBOCYTOPENIA    Past Surgical History:  Procedure Laterality Date  . CARDIAC CATHETERIZATION  06/24/2014  . CATARACT EXTRACTION  2012   right eye  . COLONOSCOPY    . CORONARY ARTERY BYPASS GRAFT      quad bypass/2007  . EYE SURGERY    . LEFT HEART CATHETERIZATION WITH CORONARY/GRAFT ANGIOGRAM  [name] 06/24/2014   Procedure: LEFT HEART CATHETERIZATION WITH Beatrix Fetters;  Surgeon: Burnell Blanks, MD;  Location[facility] C[facility] LAB;  Service: Cardiovascular;  Laterality: [name];  . POLYPECTOMY    . TONSILLECTOMY       Current Outpatient Medications  Medication Sig Dispense Refill  . apixaban (ELIQUIS) 5 MG TABS tablet Take 1 tablet (5 mg total) by mouth 2 (two) times daily. 60 tablet 11  . Ascorbic Acid (VITAMIN C) 1000 MG tablet Take 500 mg by mouth daily.     [name] aspirin 81 MG tablet Take 81 mg by mouth daily.      . Bevacizumab (AVASTIN) 100 MG/4ML SOLN as needed. EYE INJECTION    . calcium-vitamin D (OSCAL WITH D) 500-200 MG-UNIT per tablet Take 1 tablet by mouth daily. Reported on 04/26/2015    . Cholecalciferol (VITAMIN D3) 2000 UNITS TABS Take 1 tablet by mouth daily.     . Coenzyme Q10 (CO Q 10 PO) Take 300 mg by mouth daily.    . fish oil-omega-3 fatty acids 1000 MG capsule Take 1 g by mouth daily.      . furosemide (LASIX) 20 MG tablet Take 20 mg by mouth daily.  0  . glipiZIDE (GLUCOTROL) 10 MG tablet Take 10 mg by mouth 2 (two) times daily before a meal.    . metFORMIN (GLUCOPHAGE) 500 MG tablet Take 2 tablets (1,000 mg total) by mouth 2 (two) times daily with a meal.    . metoprolol succinate (TOPROL-XL) 25 MG 24 hr tablet  Take 1 tablet (25 mg total) by mouth daily. 90 tablet 3  . Multiple Vitamin (MULTI-DAY VITAMINS PO) Take 1 tablet by mouth daily. 1 tab daily    . omeprazole (PRILOSEC) 20 MG capsule Take 20 mg by mouth 2 (two) times daily before a meal.     . sacubitril-valsartan (ENTRESTO) 24-26 MG Take 1 tablet by mouth 2 (two) times daily. (Patient taking differently: Take 1 tablet by mouth daily. ) 60 tablet 3  . simvastatin (ZOCOR) 20 MG tablet Take 20 mg by mouth daily at 6 PM.     . Turmeric 450 MG CAPS Take by mouth daily.     . vitamin E 1000 UNIT capsule Take 1,000 Units by mouth every other day.     No current facility-administered medications for this  visit.     Allergies:   Patient has no known allergies.   Social History:  The patient  reports that he quit smoking about 46 years ago. His smoking use included cigarettes. he has never used smokeless tobacco. He reports that he drinks about 0.6 oz of alcohol per week. He reports that he does not use drugs.   Family History:  The patient's family history includes Colon cancer in his maternal aunt and maternal uncle; Diverticulitis in his unknown relative; Heart attack in his father and unknown relative; Heart disease (age of onset: 81) in his father; Hyperlipidemia in his father; Hypertension in his father; Stroke in his father; Varicose Veins in his father.    ROS:  Please see the history of present illness.   Otherwise, review of systems is positive for none.   All other systems are reviewed and negative.    PHYSICAL EXAM: VS:  BP 124/60   Pulse 89   Ht 6' (1.829 m)   Wt 200 lb (90.7 kg)   SpO2 97%   BMI 27.12 kg/m  , BMI Body mass index is 27.12 kg/m. GEN: Well nourished, well developed, in no acute distress  HEENT: normal  Neck: no JVD, carotid bruits, or masses Cardiac: iRRR; no murmurs, rubs, or gallops,no edema  Respiratory:  clear to auscultation bilaterally, normal work of breathing GI: soft, nontender, nondistended, + BS MS: no deformity or atrophy  Skin: warm and dry Neuro:  Strength and sensation are intact Psych: euthymic mood, full affect  EKG:  EKG is not ordered today. Personal review of the ekg ordered 07/06/16 shows atrial fibrillation, rate 70  Recent Labs: 02/21/2017: BUN 33; Creatinine, Ser 1.35; Potassium 5.1; Sodium 137    Lipid Panel  No results found for: CHOL, TRIG, HDL, CHOLHDL, VLDL, LDLCALC, LDLDIRECT   Wt Readings from Last 3 Encounters:  03/08/17 200 lb (90.7 kg)  02/21/17 205 lb (93 kg)  01/10/17 202 lb 12.8 oz (92 kg)      Other studies Reviewed: Additional studies/ records that were reviewed today include: TTE 01/17/17  Review of  the above records today demonstrates:  - Left ventricle: Diffuse hypokinesis with septal and apical   akinesis LV apical band no thrombus. The cavity size was severely   dilated. Wall thickness was normal. The estimated ejection   fraction was 25%. Doppler parameters are consistent with both   elevated ventricular end-diastolic filling pressure and elevated   left atrial filling pressure. - Mitral valve: Severely calcified annulus. - Left atrium: The atrium was moderately dilated. - Right atrium: The atrium was moderately dilated. - Atrial septum: No defect or patent foramen ovale was identified. - Pulmonary arteries: PA  peak pressure: 56 mm Hg (S).   ASSESSMENT AND PLAN:  1.  Chronic systolic heart failure due to ischemic cardiomyopathy: Has severe native disease but stable bypass grafts is a 2016.  Ejection fraction to 25% on his most recent echo.  Does have class II symptoms.  Is currently on Entresto and Toprol-XL.  I discussed with him the possibility of ICD implantation.  Risks and benefits discussed.  Risks include bleeding, tamponade, infection, pneumothorax, lead malfunction.  He understands these risks and is agreed to the procedure.  He has a handicapped son and would like to defer this procedure until February or March.  2.  Coronary artery disease without angina: No symptoms significant for angina.  Continue aspirin, statin, beta-blocker  3.  Hypertension: Currently well controlled.  4.  Atrial fibrillation, persistent: Currently on Toprol-XL and Eliquis.  Amiodarone was stopped in the past due to fatigue.  Currently no symptoms.  No changes.  This patients CHA2DS2-VASc Score and unadjusted Ischemic Stroke Rate (% per year) is equal to 7.2 % stroke rate/year from a score of 5  Above score calculated as 1 point each if present [CHF, HTN, DM, Vascular=MI/PAD/Aortic Plaque, Age if 65-74, or Male] Above score calculated as 2 points each if present [Age > 75, or  Stroke/TIA/TE]    Current medicines are reviewed at length with the patient today.   The patient does not have concerns regarding his medicines.  The following changes were made today:  none  Labs/ tests ordered today include:  No orders of the defined types were placed in this encounter.    Disposition:   FU with Nevaeh Korte 3 months  Signed, Chaslyn Eisen Meredith Leeds, MD  03/08/2017 10:38 AM     [facility] [address] [place] [address] [phone] (office) [phone] (fax)

## 2017-03-08 NOTE — Addendum Note (Signed)
Addended by: Stanton Kidney on: 03/08/2017 10:45 AM   Modules accepted: Orders

## 2017-03-11 ENCOUNTER — Telehealth: Payer: Self-pay

## 2017-03-11 NOTE — Telephone Encounter (Signed)
Letter of denial of Entresto received today. Appeal filed by fax.

## 2017-03-14 ENCOUNTER — Telehealth: Payer: Self-pay | Admitting: *Deleted

## 2017-03-14 ENCOUNTER — Institutional Professional Consult (permissible substitution): Payer: PPO | Admitting: Cardiology

## 2017-03-14 NOTE — Telephone Encounter (Signed)
Received fax from [employer]. This medication has been approved from dates 03/12/2017-03/25/2018.   Called and left patient a message regarding the approval, informed him to call his pharmacy for a price.

## 2017-03-21 ENCOUNTER — Other Ambulatory Visit: Payer: PPO | Admitting: *Deleted

## 2017-03-21 DIAGNOSIS — Z79899 Other long term (current) drug therapy: Secondary | ICD-10-CM

## 2017-03-21 DIAGNOSIS — I5022 Chronic systolic (congestive) heart failure: Secondary | ICD-10-CM

## 2017-03-21 LAB — BASIC METABOLIC PANEL
BUN/Creatinine Ratio: 27 — ABNORMAL HIGH (ref 10–24)
BUN: 38 mg/dL — ABNORMAL HIGH (ref 8–27)
CO2: 23 mmol/L (ref 20–29)
Calcium: 9.6 mg/dL (ref 8.6–10.2)
Chloride: 99 mmol/L (ref 96–106)
Creatinine, Ser: 1.42 mg/dL — ABNORMAL HIGH (ref 0.76–1.27)
GFR calc Af Amer: 54 mL/min/{1.73_m2} — ABNORMAL LOW (ref >59)
GFR calc non Af Amer: 47 mL/min/{1.73_m2} — ABNORMAL LOW (ref >59)
Glucose: 209 mg/dL — ABNORMAL HIGH (ref 65–99)
Potassium: 5.6 mmol/L — ABNORMAL HIGH (ref 3.5–5.2)
Sodium: 139 mmol/L (ref 134–144)

## 2017-03-28 ENCOUNTER — Other Ambulatory Visit: Payer: Self-pay | Admitting: *Deleted

## 2017-03-28 ENCOUNTER — Other Ambulatory Visit: Payer: PPO | Admitting: *Deleted

## 2017-03-28 DIAGNOSIS — R7989 Other specified abnormal findings of blood chemistry: Secondary | ICD-10-CM | POA: Diagnosis not present

## 2017-03-28 DIAGNOSIS — E875 Hyperkalemia: Secondary | ICD-10-CM

## 2017-03-28 LAB — BASIC METABOLIC PANEL
BUN/Creatinine Ratio: 18 (ref 10–24)
BUN: 22 mg/dL (ref 8–27)
CO2: 24 mmol/L (ref 20–29)
Calcium: 9.6 mg/dL (ref 8.6–10.2)
Chloride: 101 mmol/L (ref 96–106)
Creatinine, Ser: 1.22 mg/dL (ref 0.76–1.27)
GFR calc Af Amer: 65 mL/min/{1.73_m2} (ref >59)
GFR calc non Af Amer: 56 mL/min/{1.73_m2} — ABNORMAL LOW (ref >59)
Glucose: 111 mg/dL — ABNORMAL HIGH (ref 65–99)
Potassium: 4.9 mmol/L (ref 3.5–5.2)
Sodium: 142 mmol/L (ref 134–144)

## 2017-04-12 DIAGNOSIS — I429 Cardiomyopathy, unspecified: Secondary | ICD-10-CM | POA: Diagnosis not present

## 2017-04-12 DIAGNOSIS — I480 Paroxysmal atrial fibrillation: Secondary | ICD-10-CM | POA: Diagnosis not present

## 2017-04-12 DIAGNOSIS — E782 Mixed hyperlipidemia: Secondary | ICD-10-CM | POA: Diagnosis not present

## 2017-04-12 DIAGNOSIS — I129 Hypertensive chronic kidney disease with stage 1 through stage 4 chronic kidney disease, or unspecified chronic kidney disease: Secondary | ICD-10-CM | POA: Diagnosis not present

## 2017-04-12 DIAGNOSIS — K219 Gastro-esophageal reflux disease without esophagitis: Secondary | ICD-10-CM | POA: Diagnosis not present

## 2017-04-12 DIAGNOSIS — I10 Essential (primary) hypertension: Secondary | ICD-10-CM | POA: Diagnosis not present

## 2017-04-12 DIAGNOSIS — E1122 Type 2 diabetes mellitus with diabetic chronic kidney disease: Secondary | ICD-10-CM | POA: Diagnosis not present

## 2017-05-02 ENCOUNTER — Telehealth: Payer: Self-pay | Admitting: Cardiology

## 2017-05-02 ENCOUNTER — Encounter: Payer: Self-pay | Admitting: *Deleted

## 2017-05-02 NOTE — Telephone Encounter (Signed)
[name] is asking that you give him a cal about his potential procedure . Thanks

## 2017-05-02 NOTE — Telephone Encounter (Signed)
Scheduled ICD implant for 3/19, per pt request. He understands I will call him within the next several weeks to review instructions. Pt is agreeable to plan.

## 2017-05-09 ENCOUNTER — Ambulatory Visit (HOSPITAL_COMMUNITY)
Admission: RE | Admit: 2017-05-09 | Discharge: 2017-05-09 | Disposition: A | Discharge status: Home / Self Care | Payer: PPO | Source: Ambulatory Visit | Attending: Family | Admitting: Family

## 2017-05-09 ENCOUNTER — Ambulatory Visit (INDEPENDENT_AMBULATORY_CARE_PROVIDER_SITE_OTHER): Payer: PPO | Admitting: Family

## 2017-05-09 ENCOUNTER — Encounter: Payer: Self-pay | Admitting: Family

## 2017-05-09 VITALS — BP 141/69 | HR 66 | Resp 20 | Ht 72.0 in | Wt 200.0 lb

## 2017-05-09 DIAGNOSIS — I739 Peripheral vascular disease, unspecified: Secondary | ICD-10-CM | POA: Diagnosis not present

## 2017-05-09 DIAGNOSIS — Z87891 Personal history of nicotine dependence: Secondary | ICD-10-CM

## 2017-05-09 DIAGNOSIS — I872 Venous insufficiency (chronic) (peripheral): Secondary | ICD-10-CM

## 2017-05-09 DIAGNOSIS — I6523 Occlusion and stenosis of bilateral carotid arteries: Secondary | ICD-10-CM | POA: Diagnosis not present

## 2017-05-09 DIAGNOSIS — I10 Essential (primary) hypertension: Secondary | ICD-10-CM | POA: Insufficient documentation

## 2017-05-09 LAB — VAS US CAROTID
LEFT ECA DIAS: -7 cm/s
LEFT VERTEBRAL DIAS: -12 cm/s
Left CCA dist dias: -22 cm/s
Left CCA dist sys: -84 cm/s
Left CCA prox dias: 17 cm/s
Left CCA prox sys: 116 cm/s
Left ICA dist dias: -33 cm/s
Left ICA dist sys: -86 cm/s
Left ICA prox dias: -34 cm/s
Left ICA prox sys: -91 cm/s
RIGHT CCA MID DIAS: 26 cm/s
RIGHT ECA DIAS: 0 cm/s
RIGHT VERTEBRAL DIAS: 10 cm/s
Right CCA prox dias: -22 cm/s
Right CCA prox sys: -123 cm/s
Right cca dist sys: -87 cm/s

## 2017-05-09 NOTE — Progress Notes (Signed)
Chief Complaint: Follow up Extracranial Carotid Artery Stenosis   History of Present Illness  [name]. is a 82 y.o. male whom Dr. Donnetta Hutching has been monitoring for asymptomatic extracranial internal carotid artery stenosis. He is right-handed. He denies any neurologic deficits specifically no amaurosis fugax, transient ischemic attack or stroke. He reports that he is also stable from a cardiac standpoint. He had his carotid stenosis initially discovered at the time of coronary bypass grafting in 2007 and has had serial ultrasounds since then.  Patient has not had previous carotid artery intervention. He denies tingling, weakness, or pain in either hand or arm, denies dizziness with raising arms above his head.  He denies claudication symptoms in his legs with walking, denies non healing wounds. He works out at a gym 3days/week.  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   He has not had much swelling in his legs since he lost weight and has been taking furosemide.  New onset atrial fib March 2016, placed on Eliquis, converted to SR with medications about a month later, remains on Eliquis. He was started on furosemide for LE edema and since developing atrial fib; this has helped his LE edema and his breathing.  He had pneumonia January 2016.  He is scheduled for an ICD placement in March 2019 by Dr. Curt Bears.   Pt Diabetic: Yes, states his last A1C was between 6.6, states he has since improved his lifestyle habits Pt smoker: former smoker, quit in 1972   Past Medical History:  Diagnosis Date  . ANEMIA, HX OF   . Bronchial pneumonia Jan. 2016  . CAD, ARTERY BYPASS GRAFT   . CAROTID ARTERY STENOSIS   . Cataract   . DIABETES MELLITUS, TYPE II   . Diabetic retinal damage of right eye ([place]) 2015  . Diverticulosis   . HYPERCHOLESTEROLEMIA   . HYPERTENSION   . Myocardial infarction [facility]) June 2007  . New onset atrial fibrillation ([place]) 06/24/2014  . PULMONARY  HYPERTENSION   . RENAL INSUFFICIENCY, CHRONIC   . UNSPECIFIED THROMBOCYTOPENIA     Social History Social History   Tobacco Use  . Smoking status: Former Smoker    Types: Cigarettes    Last attempt to quit: 03/26/1970    Years since quitting: 47.1  . Smokeless tobacco: Never Used  Substance Use Topics  . Alcohol use: Yes    Alcohol/week: 0.6 oz    Types: 1 Glasses of wine per week  . Drug use: No    Family History Family History  Problem Relation Age of Onset  . Heart disease Father 48       Heart disease before age 2  . Heart attack Father   . Hypertension Father   . Hyperlipidemia Father   . Varicose Veins Father   . Stroke Father        ? not sure  . Colon cancer Maternal Aunt   . Colon cancer Maternal Uncle   . Diverticulitis Unknown   . Heart attack Unknown     Surgical History Past Surgical History:  Procedure Laterality Date  . CARDIAC CATHETERIZATION  06/24/2014  . CATARACT EXTRACTION  2012   right eye  . COLONOSCOPY    . CORONARY ARTERY BYPASS GRAFT      quad bypass/2007  . EYE SURGERY    . LEFT HEART CATHETERIZATION WITH CORONARY/GRAFT ANGIOGRAM [name] 06/24/2014   Procedure: LEFT HEART CATHETERIZATION WITH Beatrix Fetters;  Surgeon: Burnell Blanks, MD;  [facility] CATH  LAB;  Service: Cardiovascular;  Laterality: [name];  . POLYPECTOMY    . TONSILLECTOMY      No Known Allergies  Current Outpatient Medications  Medication Sig Dispense Refill  . apixaban (ELIQUIS) 5 MG TABS tablet Take 1 tablet (5 mg total) by mouth 2 (two) times daily. 60 tablet 11  . Ascorbic Acid (VITAMIN C) 1000 MG tablet Take 500 mg by mouth daily.     [name] aspirin 81 MG tablet Take 81 mg by mouth daily.      . Bevacizumab (AVASTIN) 100 MG/4ML SOLN as needed. EYE INJECTION    . calcium-vitamin D (OSCAL WITH D) 500-200 MG-UNIT per tablet Take 1 tablet by mouth daily. Reported on 04/26/2015    . Cholecalciferol (VITAMIN D3) 2000 UNITS TABS Take 1 tablet by mouth daily.      . Coenzyme Q10 (CO Q 10 PO) Take 300 mg by mouth daily.    . fish oil-omega-3 fatty acids 1000 MG capsule Take 1 g by mouth daily.      . furosemide (LASIX) 20 MG tablet Take 20 mg by mouth daily.  0  . glipiZIDE (GLUCOTROL) 10 MG tablet Take 10 mg by mouth 2 (two) times daily before a meal.    . metFORMIN (GLUCOPHAGE) 500 MG tablet Take 2 tablets (1,000 mg total) by mouth 2 (two) times daily with a meal.    . metoprolol succinate (TOPROL-XL) 25 MG 24 hr tablet Take 1 tablet (25 mg total) by mouth daily. 90 tablet 3  . Multiple Vitamin (MULTI-DAY VITAMINS PO) Take 1 tablet by mouth daily. 1 tab daily    . omeprazole (PRILOSEC) 20 MG capsule Take 20 mg by mouth 2 (two) times daily before a meal.     . sacubitril-valsartan (ENTRESTO) 24-26 MG Take 1 tablet by mouth 2 (two) times daily. (Patient taking differently: Take 1 tablet by mouth daily. ) 60 tablet 3  . simvastatin (ZOCOR) 20 MG tablet Take 20 mg by mouth daily at 6 PM.     . Turmeric 450 MG CAPS Take by mouth daily.     . vitamin E 1000 UNIT capsule Take 1,000 Units by mouth every other day.     No current facility-administered medications for this visit.     Review of Systems : See HPI for pertinent positives and negatives.  Physical Examination  Vitals:   05/09/17 1442 05/09/17 1443  BP: 124/70 (!) 141/69  Pulse: 66   Resp: 20   SpO2: 100%   Weight: 200 lb (90.7 kg)   Height: 6' (1.829 m)    Body mass index is 27.12 kg/m.  General: WDWN male in NAD GAIT:normal HENT: No gross abnormalities  Eyes: PERRLA Pulmonary: Respirations are non-labored, CTAB, no rales, rhonchi, or wheezing. Cardiac: Irregular rhythm , controlled rate, no detected murmur.  VASCULAR EXAM Carotid Bruits Left Right   Negative Negative   Radial pulses are 2+ palpable and equal. Bilateral DP pulses are palpable.       Gastrointestinal: soft, nontender, BS WNL, no r/g,no palpable masses. Musculoskeletal: No muscle atrophy/wasting. M/S 5/5 throughout, Extremities without ischemic changes. Trace bilateral pitting edema in both lower legs with mild chronic venous insufficiency changes.  Skin: No rash, no cellulitis, no ulcers.  Neurologic: A&O X 3; appropriate affect, speech is normal, CN 2-12 intact, Pain and light touch intact in extremities, Motor exam as listed above Psychiatric: Normal thought content, mood appropriate to clinical situation.       Assessment: [name].  is a 82 y.o. male who has no history of stroke or TIA.   His atherosclerotic risk factors include currently controlled DM, remote history of smoking, CAD, and chronic renal insufficiency.  He takes Eliquis for a history of atrial fib, takes a daily ASA and a statin. He stays physically active.  DATA Carotid Duplex (10/30/16): 40-59% right proximal internal carotid artery stenosis. 40-59% (high end of range) left proximal internal carotid artery stenosis.  Bilateral vertebral artery flow is antegrade.  Bilateral subclavian artery waveforms are normal.  Increased stenosis on the left ICA compared to the exam on 10-29-16.    Plan: Follow-up in 80monthswith Carotid Duplex scan   I discussed in depth with the patient the nature of atherosclerosis, and emphasized the importance of maximal medical management including strict control of blood pressure, blood glucose, and lipid levels, obtaining regular exercise, and continued cessation of smoking.  The patient is aware that without maximal medical management the underlying atherosclerotic disease process will progress, limiting the benefit of any interventions. The patient was given information about stroke prevention and what symptoms should prompt the patient to seek  immediate medical care. Thank you for allowing [place] to participate in this patient's care.  Clemon Chambers, RN, MSN, FNP-C [facility] Office: [phone]  Clinic Physician: Early in Vein clinic   05/09/17 2:50 PM

## 2017-05-09 NOTE — Patient Instructions (Signed)

## 2017-05-22 NOTE — Telephone Encounter (Signed)
Pt currently at [employer]. He understands I will call later to review instructions.

## 2017-05-22 NOTE — Telephone Encounter (Signed)
H&P appt made for 3/8. Pt aware I will call Friday to go over instructions for procedure. Patient verbalized understanding and agreeable to plan.

## 2017-05-27 ENCOUNTER — Ambulatory Visit: Payer: PPO | Admitting: Cardiovascular Disease

## 2017-05-27 ENCOUNTER — Encounter: Payer: Self-pay | Admitting: Cardiovascular Disease

## 2017-05-27 VITALS — BP 134/70 | HR 73 | Ht 72.0 in | Wt 197.4 lb

## 2017-05-27 DIAGNOSIS — E7800 Pure hypercholesterolemia, unspecified: Secondary | ICD-10-CM

## 2017-05-27 DIAGNOSIS — I481 Persistent atrial fibrillation: Secondary | ICD-10-CM | POA: Diagnosis not present

## 2017-05-27 DIAGNOSIS — I5022 Chronic systolic (congestive) heart failure: Secondary | ICD-10-CM

## 2017-05-27 DIAGNOSIS — I255 Ischemic cardiomyopathy: Secondary | ICD-10-CM

## 2017-05-27 DIAGNOSIS — I10 Essential (primary) hypertension: Secondary | ICD-10-CM | POA: Diagnosis not present

## 2017-05-27 DIAGNOSIS — I25810 Atherosclerosis of coronary artery bypass graft(s) without angina pectoris: Secondary | ICD-10-CM | POA: Diagnosis not present

## 2017-05-27 DIAGNOSIS — I6523 Occlusion and stenosis of bilateral carotid arteries: Secondary | ICD-10-CM

## 2017-05-27 DIAGNOSIS — I4819 Other persistent atrial fibrillation: Secondary | ICD-10-CM

## 2017-05-27 NOTE — Patient Instructions (Signed)

## 2017-05-27 NOTE — Progress Notes (Signed)
Chief Complaint  Patient presents with  . Follow-up    chronic systolic CHF     History of Present Illness: 82 yo male with history of atrial fibrillation, ischemic cardiomyopathy, chronic systolic CHF, DM, HTN, HLD, CAD s/p 4V CABG in 2007, carotid artery disease here today for follow up. He underwent 4V CABG in 2007 (LIMA to LAD, SVG to OM, SVG sequential to PLA/PDA). Carotid artery disease followed in [facility]. Echo 06/17/14 showed LVEF=25-30%, akinesis of the inferoseptum and apex. No significant valve disease. I arranged a cardiac cath on 05/27/14 which showed severe native vessel CAD and 4 patent bypass grafts. He was noted to be in atrial fibrillation the day of the cardiac cath and was started on Eliquis. He has tolerated the Eliquis well. No bleeding events. I saw him 01/10/17 and he c/o dyspnea with exertion and worsened fatigue. Echo 01/17/17 with LVEF=25%, diffuse hypokinesis of the septal and apical walls. The LV cavity is severely dilated. Severe MAC. Bilateral atrial enlargement. PA pressure 56 mmHg. At his last visit in my office in November 2018, he c/o ongoing dyspnea and fatigue. He was referred to Dr. Lennie Odor in the EP clinic and plans are in place for ICD placement on 06/11/17.   He is here today for follow up. The patient denies any chest pain, dyspnea, palpitations, lower extremity edema, orthopnea, PND, dizziness, near syncope or syncope. He is feeling well overall.     Primary Care Physician: Shirline Frees, MD  Past Medical History:  Diagnosis Date  . ANEMIA, HX OF   . Bronchial pneumonia Jan. 2016  . CAD, ARTERY BYPASS GRAFT   . CAROTID ARTERY STENOSIS   . Cataract   . DIABETES MELLITUS, TYPE II   . Diabetic retinal damage of right eye ([place]) 2015  . Diverticulosis   . HYPERCHOLESTEROLEMIA   . HYPERTENSION   . Myocardial infarction [facility]) June 2007  . New onset atrial fibrillation ([place]) 06/24/2014  . PULMONARY HYPERTENSION   . RENAL INSUFFICIENCY, CHRONIC   .  UNSPECIFIED THROMBOCYTOPENIA     Past Surgical History:  Procedure Laterality Date  . CARDIAC CATHETERIZATION  06/24/2014  . CATARACT EXTRACTION  2012   right eye  . COLONOSCOPY    . CORONARY ARTERY BYPASS GRAFT      quad bypass/2007  . EYE SURGERY    . LEFT HEART CATHETERIZATION WITH CORONARY/GRAFT ANGIOGRAM [name] 06/24/2014   Procedure: LEFT HEART CATHETERIZATION WITH Beatrix Fetters;  Surgeon: Burnell Blanks, MD;  Location: [facility] CATH LAB;  Service: Cardiovascular;  Laterality: [name];  . POLYPECTOMY    . TONSILLECTOMY      Current Outpatient Medications  Medication Sig Dispense Refill  . apixaban (ELIQUIS) 5 MG TABS tablet Take 1 tablet (5 mg total) by mouth 2 (two) times daily. 60 tablet 11  . Ascorbic Acid (VITAMIN C) 1000 MG tablet Take 500 mg by mouth daily.     [name] aspirin 81 MG tablet Take 81 mg by mouth daily.      . Bevacizumab (AVASTIN) 100 MG/4ML SOLN as needed. EYE INJECTION    . calcium-vitamin D (OSCAL WITH D) 500-200 MG-UNIT per tablet Take 1 tablet by mouth daily. Reported on 04/26/2015    . Cholecalciferol (VITAMIN D3) 2000 UNITS TABS Take 1 tablet by mouth daily.     . Coenzyme Q10 (CO Q 10 PO) Take 300 mg by mouth daily.    . fish oil-omega-3 fatty acids 1000 MG capsule Take 1 g by mouth daily.      [name]  furosemide (LASIX) 20 MG tablet Take 20 mg by mouth daily.  0  . glipiZIDE (GLUCOTROL) 10 MG tablet Take 10 mg by mouth 2 (two) times daily before a meal.    . metFORMIN (GLUCOPHAGE) 500 MG tablet Take 2 tablets (1,000 mg total) by mouth 2 (two) times daily with a meal.    . metoprolol succinate (TOPROL-XL) 25 MG 24 hr tablet Take 1 tablet (25 mg total) by mouth daily. 90 tablet 3  . Multiple Vitamin (MULTI-DAY VITAMINS PO) Take 1 tablet by mouth daily. 1 tab daily    . omeprazole (PRILOSEC) 20 MG capsule Take 20 mg by mouth 2 (two) times daily before a meal.     . sacubitril-valsartan (ENTRESTO) 24-26 MG Take 1 tablet by mouth 2 (two) times daily.    .  simvastatin (ZOCOR) 20 MG tablet Take 20 mg by mouth daily at 6 PM.     . Turmeric 450 MG CAPS Take by mouth daily.     . vitamin E 1000 UNIT capsule Take 1,000 Units by mouth every other day.     No current facility-administered medications for this visit.     No Known Allergies  Social History   Socioeconomic History  . Marital status: Married    Spouse name: Not on file  . Number of children: 2  . Years of education: Not on file  . Highest education level: Not on file  Social Needs  . Financial resource strain: Not on file  . Food insecurity - worry: Not on file  . Food insecurity - inability: Not on file  . Transportation needs - medical: Not on file  . Transportation needs - non-medical: Not on file  Occupational History  . Occupation: Horticulturist, commercial: RETIRED  Tobacco Use  . Smoking status: Former Smoker    Types: Cigarettes    Last attempt to quit: 03/26/1970    Years since quitting: 47.2  . Smokeless tobacco: Never Used  Substance and Sexual Activity  . Alcohol use: Yes    Alcohol/week: 0.6 oz    Types: 1 Glasses of wine per week  . Drug use: No  . Sexual activity: Not on file  Other Topics Concern  . Not on file  Social History Narrative  . Not on file    Family History  Problem Relation Age of Onset  . Heart disease Father 78       Heart disease before age 63  . Heart attack Father   . Hypertension Father   . Hyperlipidemia Father   . Varicose Veins Father   . Stroke Father        ? not sure  . Colon cancer Maternal Aunt   . Colon cancer Maternal Uncle   . Diverticulitis Unknown   . Heart attack Unknown     Review of Systems:  As stated in the HPI and otherwise negative.   BP 134/70   Pulse 73   Ht 6' (1.829 m)   Wt 197 lb 6.4 oz (89.5 kg)   SpO2 99%   BMI 26.77 kg/m   Physical Examination:  General: Well developed, well nourished, NAD  HEENT: OP clear, mucus membranes moist  SKIN: warm, dry. No rashes. Neuro: No focal  deficits  Musculoskeletal: Muscle strength 5/5 all ext  Psychiatric: Mood and affect normal  Neck: No JVD, no carotid bruits, no thyromegaly, no lymphadenopathy.  Lungs:Clear bilaterally, no wheezes, rhonci, crackles Cardiovascular: Regular rate and rhythm. No murmurs, gallops or  rubs. Abdomen:Soft. Bowel sounds present. Non-tender.  Extremities: No lower extremity edema. Pulses are 2 + in the bilateral DP/PT.  Echo 01/17/17: Left ventricle: Diffuse hypokinesis with septal and apical   akinesis LV apical band no thrombus. The cavity size was severely   dilated. Wall thickness was normal. The estimated ejection   fraction was 25%. Doppler parameters are consistent with both   elevated ventricular end-diastolic filling pressure and elevated   left atrial filling pressure. - Mitral valve: Severely calcified annulus. - Left atrium: The atrium was moderately dilated. - Right atrium: The atrium was moderately dilated. - Atrial septum: No defect or patent foramen ovale was identified. - Pulmonary arteries: PA peak pressure: 56 mm Hg (S).  Cardiac cath 06/24/14: Hemodynamic Findings: Central aortic pressure: 124/62 Left ventricular pressure: 130/3/9 Angiographic Findings: Left main: 30% mid stenosis.  Left Anterior Descending Artery: Large caliber diffusely diseased vessel that courses to the apex. The proximal vessel has a 95% stenosis. There is a small caliber diagonal branch that is supplied by the proximal LAD.The mid LAD is occluded. The mid and distal LAD fills from the patent IMA graft. There is a moderate caliber septal perforating branch.  Circumflex Artery: Moderate caliber vessel with proximal 80% stenosis, 100% mid occlusion. The obtuse marginal branch is occluded and fills from the patent vein graft.  Right Coronary Artery: Large dominant vessel with diffuse 30% proximal stenosis, 95% mid stenosis, diffuse 30% distal stenosis. There is diffuse 90% distal stenosis just before the  bifurcation. The PLA and PDA fill antegrade and from the patent sequential vein grafts.  Graft Anatomy:  SVG to OM is patent. There are two valves in the body of the vein graft and the appearance of this graft is unchanged from last cath in 2010.  SVG sequential to PLA/PDA is patent LIMA to mid LAD is patent. The target LAD vessel is small and diffusely diseased.  Left Ventricular Angiogram: Deferred.   EKG:  EKG is ordered today. The ekg ordered today demonstrates Atrial fib, rate 73 bpm. Lateral T wave flattening.   Recent Labs: 03/28/2017: BUN 22; Creatinine, Ser 1.22; Potassium 4.9; Sodium 142   Lipid Panel  Followed in primary care   Wt Readings from Last 3 Encounters:  05/27/17 197 lb 6.4 oz (89.5 kg)  05/09/17 200 lb (90.7 kg)  03/08/17 200 lb (90.7 kg)    Other studies Reviewed: Additional studies/ records that were reviewed today include: . Review of the above records demonstrates:  Assessment and Plan:   1. CAD s/p CABG without angina: NO chest pain. Will continue ASA, beta blocker and statin. .   2. Hyperlipidemia: Lipids followed in primary care. Continue statin.   3. Carotid artery disease: Followed in [facility] and stable by dopplers in February 2019.   4. HTN: BP is well controlled. No changes.   5. Ischemic Cardiomyopathy/Chronic systolic CHF: He has severe CAD but bypasses stable at cath in 2016. LV systolic function has waxed and waned over the years. His LVEF was 30% prior to his bypass in 2007 and improved to 60% post bypass. Most recent echo October 2018 with LVEF=25%. He is NYHA class 2 and is on a beta blocker and Entresto. Volume status is ok on Lasix. Plans in place for ICD implantation in two weeks.    6. Atrial fibrillation, persistent: Rate is well controlled on Toprol. Will continue Toprol, Eliquis. He had been on Amiodarone but this was stopped due to fatigue.     Current medicines  are reviewed at length with the patient today.  The patient does not have  concerns regarding medicines.  The following changes have been made:   Labs/ tests ordered today include:   Orders Placed This Encounter  Procedures  . EKG 12-Lead    Disposition:   FU with office APP in 6 months.     Signed, [name], MD 05/27/2017 9:51 AM    [facility] [place], [place], [place]  [address] Phone: [phone]; Fax: [phone]

## 2017-05-28 NOTE — Telephone Encounter (Signed)
Reviewed instructions w/ patient. He understands we will go over instructions this Friday when he see Dr. Curt Bears and will schedule post implant appts. Patient verbalized understanding and agreeable to plan.

## 2017-05-29 NOTE — Progress Notes (Signed)
Electrophysiology Office Note   Date:  05/31/2017   ID:  [name]., DOB [birth year], MRN [id number]  PCP:  Shirline Frees, MD  Cardiologist:  Angelena Form Primary Electrophysiologist:  [name] [name] Leeds, MD    Chief Complaint  Patient presents with  . Follow-up    Ischemic cardiomyopathy/Chronic systolic CHF     History of Present Illness: [name]. is a 82 y.o. male who is being seen today for the evaluation of ischemic cardiomyopathy at the request of [name]. Presenting today for electrophysiology evaluation.  Has a history of atrial fibrillation, ischemic cardiomyopathy, diabetes, hypertension, hyperlipidemia, coronary disease status post four-vessel CABG in 2007, carotid artery disease.  He has had a low ejection fractions in 2016.  In 2016 showed severe native vessel disease with 4 patent bypass grafts.  He was started on Eliquis due to atrial fibrillation. He is scheduled for ICD implant 06/11/17.   . Today, denies symptoms of palpitations, chest pain, shortness of breath, orthopnea, PND, lower extremity edema, claudication, dizziness, presyncope, syncope, bleeding, or neurologic sequela. The patient is tolerating medications without difficulties.  He is currently feeling well without complaint.   Past Medical History:  Diagnosis Date  . ANEMIA, HX OF   . Bronchial pneumonia Jan. 2016  . CAD, ARTERY BYPASS GRAFT   . CAROTID ARTERY STENOSIS   . Cataract   . DIABETES MELLITUS, TYPE II   . Diabetic retinal damage of right eye ([place]) 2015  . Diverticulosis   . HYPERCHOLESTEROLEMIA   . HYPERTENSION   . Myocardial infarction [facility]) June 2007  . New onset atrial fibrillation ([place]) 06/24/2014  . PULMONARY HYPERTENSION   . RENAL INSUFFICIENCY, CHRONIC   . UNSPECIFIED THROMBOCYTOPENIA    Past Surgical History:  Procedure Laterality Date  . CARDIAC CATHETERIZATION  06/24/2014  . CATARACT EXTRACTION  2012   right eye  . COLONOSCOPY    . CORONARY  ARTERY BYPASS GRAFT      quad bypass/2007  . EYE SURGERY    . LEFT HEART CATHETERIZATION WITH CORONARY/GRAFT ANGIOGRAM [name] 06/24/2014   Procedure: LEFT HEART CATHETERIZATION WITH [name];  Surgeon: Burnell Blanks, MD;  Location[facility] CATH LAB;  Service: Cardiovascular;  Laterality: [name];  . POLYPECTOMY    . TONSILLECTOMY       Current Outpatient Medications  Medication Sig Dispense Refill  . apixaban (ELIQUIS) 5 MG TABS tablet Take 1 tablet (5 mg total) by mouth 2 (two) times daily. 60 tablet 11  . Ascorbic Acid (VITAMIN C) 1000 MG tablet Take 500 mg by mouth daily.     [name] aspirin 81 MG tablet Take 81 mg by mouth daily.      . Bevacizumab (AVASTIN) 100 MG/4ML SOLN as needed. EYE INJECTION    . calcium-vitamin D (OSCAL WITH D) 500-200 MG-UNIT per tablet Take 1 tablet by mouth daily. Reported on 04/26/2015    . Cholecalciferol (VITAMIN D3) 2000 UNITS TABS Take 1 tablet by mouth daily.     . Coenzyme Q10 (CO Q 10 PO) Take 300 mg by mouth daily.    . fish oil-omega-3 fatty acids 1000 MG capsule Take 1 g by mouth daily.      . furosemide (LASIX) 20 MG tablet Take 20 mg by mouth daily.  0  . glipiZIDE (GLUCOTROL) 10 MG tablet Take 10 mg by mouth 2 (two) times daily before a meal.    . metFORMIN (GLUCOPHAGE) 500 MG tablet Take 2 tablets (1,000 mg total) by mouth  2 (two) times daily with a meal.    . metoprolol succinate (TOPROL-XL) 25 MG 24 hr tablet Take 1 tablet (25 mg total) by mouth daily. 90 tablet 3  . Multiple Vitamin (MULTI-DAY VITAMINS PO) Take 1 tablet by mouth daily. 1 tab daily    . omeprazole (PRILOSEC) 20 MG capsule Take 20 mg by mouth 2 (two) times daily before a meal.     . sacubitril-valsartan (ENTRESTO) 24-26 MG Take 1 tablet by mouth 2 (two) times daily.    . simvastatin (ZOCOR) 20 MG tablet Take 20 mg by mouth daily at 6 PM.     . Turmeric 450 MG CAPS Take by mouth daily.     . vitamin E 1000 UNIT capsule Take 1,000 Units by mouth every other day.     No  current facility-administered medications for this visit.     Allergies:   Patient has no known allergies.   Social History:  The patient  reports that he quit smoking about 47 years ago. His smoking use included cigarettes. he has never used smokeless tobacco. He reports that he drinks about 0.6 oz of alcohol per week. He reports that he does not use drugs.   Family History:  The patient's family history includes Colon cancer in his maternal aunt and maternal uncle; Diverticulitis in his unknown relative; Heart attack in his father and unknown relative; Heart disease (age of onset: 42) in his father; Hyperlipidemia in his father; Hypertension in his father; Stroke in his father; Varicose Veins in his father.   ROS:  Please see the history of present illness.   Otherwise, review of systems is positive for none.   All other systems are reviewed and negative.   PHYSICAL EXAM: VS:  BP 124/72   Pulse 72   Ht 6' (1.829 m)   Wt 198 lb 9.6 oz (90.1 kg)   BMI 26.94 kg/m  , BMI Body mass index is 26.94 kg/m. GEN: Well nourished, well developed, in no acute distress  HEENT: normal  Neck: no JVD, carotid bruits, or masses Cardiac: RRR; no murmurs, rubs, or gallops,no edema  Respiratory:  clear to auscultation bilaterally, normal work of breathing GI: soft, nontender, nondistended, + BS MS: no deformity or atrophy  Skin: warm and dry Neuro:  Strength and sensation are intact Psych: euthymic mood, full affect  EKG:  EKG is not ordered today. Personal review of the ekg ordered 05/27/17 shows atrial fibrillation, rate 73, low voltage   Recent Labs: 03/28/2017: BUN 22; Creatinine, Ser 1.22; Potassium 4.9; Sodium 142    Lipid Panel  No results found for: CHOL, TRIG, HDL, CHOLHDL, VLDL, LDLCALC, LDLDIRECT   Wt Readings from Last 3 Encounters:  05/31/17 198 lb 9.6 oz (90.1 kg)  05/27/17 197 lb 6.4 oz (89.5 kg)  05/09/17 200 lb (90.7 kg)      Other studies Reviewed: Additional studies/  records that were reviewed today include: TTE 01/17/17  Review of the above records today demonstrates:  - Left ventricle: Diffuse hypokinesis with septal and apical   akinesis LV apical band no thrombus. The cavity size was severely   dilated. Wall thickness was normal. The estimated ejection   fraction was 25%. Doppler parameters are consistent with both   elevated ventricular end-diastolic filling pressure and elevated   left atrial filling pressure. - Mitral valve: Severely calcified annulus. - Left atrium: The atrium was moderately dilated. - Right atrium: The atrium was moderately dilated. - Atrial septum: No defect or  patent foramen ovale was identified. - Pulmonary arteries: PA peak pressure: 56 mm Hg (S).   ASSESSMENT AND PLAN:  1.  Chronic systolic heart failure due to ischemic cardiomyopathy: Has severe native disease but stable grass.  Ejection fraction 25% on echo.  Is on optimal medical therapy with Entresto and Toprol-XL.  ICD implant scheduled 06/11/17.  Risks and benefits discussed and include bleeding, tamponade, infection, pneumothorax.  The patient understands the risks and is agreed to the procedure.  2.  Coronary artery disease without angina: No angina symptoms.  Continue current management.  3.  Hypertension: Well-controlled.  Continue current management.  4.  Atrial fibrillation, persistent: Currently on Toprol-XL and Eliquis.  Amiodarone has been stopped due to fatigue symptoms.  He is having no symptoms from atrial fibrillation.  No changes  This patients CHA2DS2-VASc Score and unadjusted Ischemic Stroke Rate (% per year) is equal to 7.2 % stroke rate/year from a score of 5  Above score calculated as 1 point each if present [CHF, HTN, DM, Vascular=MI/PAD/Aortic Plaque, Age if 65-74, or Male] Above score calculated as 2 points each if present [Age > 75, or Stroke/TIA/TE]    Current medicines are reviewed at length with the patient today.   The patient does  not have concerns regarding his medicines.  The following changes were made today: None  Labs/ tests ordered today include:  Orders Placed This Encounter  Procedures  . Basic Metabolic Panel (BMET)  . CBC w/Diff     Disposition:   FU with Xavian Hardcastle 3 months  Signed, Tamsyn Owusu [name] Leeds, MD  05/31/2017 9:06 AM     [facility] [address] [place] [address] [phone] (office) [phone] (fax)

## 2017-05-31 ENCOUNTER — Ambulatory Visit: Payer: PPO | Admitting: Cardiology

## 2017-05-31 ENCOUNTER — Encounter: Payer: Self-pay | Admitting: Cardiology

## 2017-05-31 ENCOUNTER — Other Ambulatory Visit: Payer: Self-pay | Admitting: Cardiology

## 2017-05-31 VITALS — BP 124/72 | HR 72 | Ht 72.0 in | Wt 198.6 lb

## 2017-05-31 DIAGNOSIS — I5022 Chronic systolic (congestive) heart failure: Secondary | ICD-10-CM | POA: Diagnosis not present

## 2017-05-31 DIAGNOSIS — I255 Ischemic cardiomyopathy: Secondary | ICD-10-CM | POA: Diagnosis not present

## 2017-05-31 DIAGNOSIS — I25810 Atherosclerosis of coronary artery bypass graft(s) without angina pectoris: Secondary | ICD-10-CM

## 2017-05-31 DIAGNOSIS — Z01812 Encounter for preprocedural laboratory examination: Secondary | ICD-10-CM

## 2017-05-31 DIAGNOSIS — I481 Persistent atrial fibrillation: Secondary | ICD-10-CM

## 2017-05-31 DIAGNOSIS — I4819 Other persistent atrial fibrillation: Secondary | ICD-10-CM

## 2017-05-31 DIAGNOSIS — I10 Essential (primary) hypertension: Secondary | ICD-10-CM

## 2017-05-31 LAB — BASIC METABOLIC PANEL
BUN/Creatinine Ratio: 21 (ref 10–24)
BUN: 28 mg/dL — ABNORMAL HIGH (ref 8–27)
CO2: 24 mmol/L (ref 20–29)
Calcium: 9.5 mg/dL (ref 8.6–10.2)
Chloride: 103 mmol/L (ref 96–106)
Creatinine, Ser: 1.35 mg/dL — ABNORMAL HIGH (ref 0.76–1.27)
GFR calc Af Amer: 57 mL/min/{1.73_m2} — ABNORMAL LOW (ref >59)
GFR calc non Af Amer: 50 mL/min/{1.73_m2} — ABNORMAL LOW (ref >59)
Glucose: 89 mg/dL (ref 65–99)
Potassium: 4.7 mmol/L (ref 3.5–5.2)
Sodium: 142 mmol/L (ref 134–144)

## 2017-05-31 LAB — CBC WITH DIFFERENTIAL/PLATELET
Basophils Absolute: 0 10*3/uL (ref 0.0–0.2)
Basos: 0 %
EOS (ABSOLUTE): 0.2 10*3/uL (ref 0.0–0.4)
Eos: 2 %
Hematocrit: 38.4 % (ref 37.5–51.0)
Hemoglobin: 12.9 g/dL — ABNORMAL LOW (ref 13.0–17.7)
Immature Grans (Abs): 0 10*3/uL (ref 0.0–0.1)
Immature Granulocytes: 0 %
Lymphocytes Absolute: 1.4 10*3/uL (ref 0.7–3.1)
Lymphs: 16 %
MCH: 30.2 pg (ref 26.6–33.0)
MCHC: 33.6 g/dL (ref 31.5–35.7)
MCV: 90 fL (ref 79–97)
Monocytes Absolute: 0.8 10*3/uL (ref 0.1–0.9)
Monocytes: 9 %
Neutrophils Absolute: 6.1 10*3/uL (ref 1.4–7.0)
Neutrophils: 73 %
Platelets: 250 10*3/uL (ref 150–379)
RBC: 4.27 x10E6/uL (ref 4.14–5.80)
RDW: 15.6 % — ABNORMAL HIGH (ref 12.3–15.4)
WBC: 8.5 10*3/uL (ref 3.4–10.8)

## 2017-05-31 NOTE — Patient Instructions (Addendum)
Medication Instructions:  Your physician recommends that you continue on your current medications as directed. Please refer to the Current Medication list given to you today.  * If you need a refill on your cardiac medications before your next appointment, please call your pharmacy. *  Labwork: Pre procedure labs today: BMP & CBC  Testing/Procedures: Your physician has recommended that you have a defibrillator inserted. An implantable cardioverter defibrillator (ICD) is a small device that is placed in your chest or, in rare cases, your abdomen. This device uses electrical pulses or shocks to help control life-threatening, irregular heartbeats that could lead the heart to suddenly stop beating (sudden cardiac arrest). Leads are attached to the ICD that goes into your heart. This is done in the hospital and usually requires an overnight stay. Please see the instruction sheet given to you today for more information.  Follow-Up: Your physician recommends that you schedule a follow-up appointment in: 10-14 days, after your procedure on 06/11/2017, with device clinic for a wound check.  Your physician recommends that you schedule a follow-up appointment in: 3 months, after your procedure on 06/11/2017, with Dr. Curt Bears.  Thank you for choosing [facility]!!   Trinidad Curet, RN [phone]  Any Other Special Instructions Will Be Listed Below (If Applicable).   Cardioverter Defibrillator Implantation An implantable cardioverter defibrillator (ICD) is a small device that is placed under the skin in the chest or abdomen. An ICD consists of a battery, a small computer (pulse generator), and wires (leads) that go into the heart. An ICD is used to detect and correct two types of dangerous irregular heartbeats (arrhythmias):  A rapid heart rhythm (tachycardia).  An arrhythmia in which the lower chambers of the heart (ventricles) contract in an uncoordinated way (fibrillation).  When an ICD  detects tachycardia, it sends a low-energy shock to the heart to restore the heartbeat to normal (cardioversion). This signal is usually painless. If cardioversion does not work or if the ICD detects fibrillation, it delivers a high-energy shock to the heart (defibrillation) to restart the heart. This shock may feel like a strong jolt in the chest. Your health care provider may prescribe an ICD if:  You have had an arrhythmia that originated in the ventricles.  Your heart has been damaged by a disease or heart condition.  Sometimes, ICDs are programmed to act as a device called a pacemaker. Pacemakers can be used to treat a slow heartbeat (bradycardia) or tachycardia by taking over the heart rate with electrical impulses. Tell a health care provider about:  Any allergies you have.  All medicines you are taking, including vitamins, herbs, eye drops, creams, and over-the-counter medicines.  Any problems you or family members have had with anesthetic medicines.  Any blood disorders you have.  Any surgeries you have had.  Any medical conditions you have.  Whether you are pregnant or may be pregnant. What are the risks? Generally, this is a safe procedure. However, problems may occur, including:  Swelling, bleeding, or bruising.  Infection.  Blood clots.  Damage to other structures or organs, such as nerves, blood vessels, or the heart.  Allergic reactions to medicines used during the procedure.  What happens before the procedure? Staying hydrated Follow instructions from your health care provider about hydration, which may include:  Up to 2 hours before the procedure - you may continue to drink clear liquids, such as water, clear fruit juice, black coffee, and plain tea.  Eating and drinking restrictions Follow instructions from  your health care provider about eating and drinking, which may include:  8 hours before the procedure - stop eating heavy meals or foods such as  meat, fried foods, or fatty foods.  6 hours before the procedure - stop eating light meals or foods, such as toast or cereal.  6 hours before the procedure - stop drinking milk or drinks that contain milk.  2 hours before the procedure - stop drinking clear liquids.  Medicine Ask your health care provider about:  Changing or stopping your normal medicines. This is important if you take diabetes medicines or blood thinners.  Taking medicines such as aspirin and ibuprofen. These medicines can thin your blood. Do not take these medicines before your procedure if your doctor tells you not to.  Tests  You may have blood tests.  You may have a test to check the electrical signals in your heart (electrocardiogram, ECG).  You may have imaging tests, such as a chest X-ray. General instructions  For 24 hours before the procedure, stop using products that contain nicotine or tobacco, such as cigarettes and e-cigarettes. If you need help quitting, ask your health care provider.  Plan to have someone take you home from the hospital or clinic.  You may be asked to shower with a germ-killing soap. What happens during the procedure?  To reduce your risk of infection: ? Your health care team will wash or sanitize their hands. ? Your skin will be washed with soap. ? Hair may be removed from the surgical area.  Small monitors will be put on your body. They will be used to check your heart, blood pressure, and oxygen level.  An IV tube will be inserted into one of your veins.  You will be given one or more of the following: ? A medicine to help you relax (sedative). ? A medicine to numb the area (local anesthetic). ? A medicine to make you fall asleep (general anesthetic).  Leads will be guided through a blood vessel into your heart and attached to your heart muscles. Depending on the ICD, the leads may go into one ventricle or they may go into both ventricles and into an upper chamber of  the heart. An X-ray machine (fluoroscope) will be usedto help guide the leads.  A small incision will be made to create a deep pocket under your skin.  The pulse generator will be placed into the pocket.  The ICD will be tested.  The incision will be closed with stitches (sutures), skin glue, or staples.  A bandage (dressing) will be placed over the incision. This procedure may vary among health care providers and hospitals. What happens after the procedure?  Your blood pressure, heart rate, breathing rate, and blood oxygen level will be monitored often until the medicines you were given have worn off.  A chest X-ray will be taken to check that the ICD is in the right place.  You will need to stay in the hospital for 1-2 days so your health care provider can make sure your ICD is working.  Do not drive for 24 hours if you received a sedative. Ask your health care provider when it is safe for you to drive.  You may be given an identification card explaining that you have an ICD. Summary  An implantable cardioverter defibrillator (ICD) is a small device that is placed under the skin in the chest or abdomen. It is used to detect and correct dangerous irregular heartbeats (arrhythmias).  An  ICD consists of a battery, a small computer (pulse generator), and wires (leads) that go into the heart.  When an ICD detects rapid heart rhythm (tachycardia), it sends a low-energy shock to the heart to restore the heartbeat to normal (cardioversion). If cardioversion does not work or if the ICD detects uncoordinated heart contractions (fibrillation), it delivers a high-energy shock to the heart (defibrillation) to restart the heart.  You will need to stay in the hospital for 1-2 days to make sure your ICD is working. This information is not intended to replace advice given to you by your health care provider. Make sure you discuss any questions you have with your health care provider. Document  Released: 12/02/2001 Document Revised: 03/21/2016 Document Reviewed: 03/21/2016 Elsevier Interactive Patient Education  2017 [employer].

## 2017-06-11 ENCOUNTER — Ambulatory Visit (HOSPITAL_COMMUNITY)
Admission: RE | Admit: 2017-06-11 | Discharge: 2017-06-12 | Disposition: A | Discharge status: Home / Self Care | Payer: PPO | Source: Ambulatory Visit | Attending: Cardiology | Admitting: Cardiology

## 2017-06-11 ENCOUNTER — Other Ambulatory Visit: Payer: Self-pay

## 2017-06-11 ENCOUNTER — Encounter (HOSPITAL_COMMUNITY)
Admission: RE | Disposition: A | Discharge status: Home / Self Care | Payer: Self-pay | Source: Ambulatory Visit | Attending: Cardiology

## 2017-06-11 ENCOUNTER — Encounter (HOSPITAL_COMMUNITY): Payer: Self-pay | Admitting: Cardiology

## 2017-06-11 DIAGNOSIS — I481 Persistent atrial fibrillation: Secondary | ICD-10-CM | POA: Diagnosis not present

## 2017-06-11 DIAGNOSIS — I252 Old myocardial infarction: Secondary | ICD-10-CM | POA: Diagnosis not present

## 2017-06-11 DIAGNOSIS — Z79899 Other long term (current) drug therapy: Secondary | ICD-10-CM | POA: Diagnosis not present

## 2017-06-11 DIAGNOSIS — I6529 Occlusion and stenosis of unspecified carotid artery: Secondary | ICD-10-CM

## 2017-06-11 DIAGNOSIS — Z006 Encounter for examination for normal comparison and control in clinical research program: Secondary | ICD-10-CM | POA: Insufficient documentation

## 2017-06-11 DIAGNOSIS — Z7901 Long term (current) use of anticoagulants: Secondary | ICD-10-CM | POA: Diagnosis not present

## 2017-06-11 DIAGNOSIS — Z7982 Long term (current) use of aspirin: Secondary | ICD-10-CM | POA: Diagnosis not present

## 2017-06-11 DIAGNOSIS — I110 Hypertensive heart disease with heart failure: Secondary | ICD-10-CM | POA: Diagnosis not present

## 2017-06-11 DIAGNOSIS — I2510 Atherosclerotic heart disease of native coronary artery without angina pectoris: Secondary | ICD-10-CM | POA: Insufficient documentation

## 2017-06-11 DIAGNOSIS — I255 Ischemic cardiomyopathy: Secondary | ICD-10-CM | POA: Diagnosis not present

## 2017-06-11 DIAGNOSIS — Z7984 Long term (current) use of oral hypoglycemic drugs: Secondary | ICD-10-CM | POA: Diagnosis not present

## 2017-06-11 DIAGNOSIS — I5022 Chronic systolic (congestive) heart failure: Secondary | ICD-10-CM

## 2017-06-11 DIAGNOSIS — Z95818 Presence of other cardiac implants and grafts: Secondary | ICD-10-CM

## 2017-06-11 DIAGNOSIS — I509 Heart failure, unspecified: Secondary | ICD-10-CM | POA: Diagnosis not present

## 2017-06-11 DIAGNOSIS — E1151 Type 2 diabetes mellitus with diabetic peripheral angiopathy without gangrene: Secondary | ICD-10-CM | POA: Diagnosis not present

## 2017-06-11 HISTORY — PX: ICD IMPLANT: EP1208

## 2017-06-11 LAB — SURGICAL PCR SCREEN
MRSA, PCR: NEGATIVE
Staphylococcus aureus: NEGATIVE

## 2017-06-11 LAB — GLUCOSE, CAPILLARY
Glucose-Capillary: 84 mg/dL (ref 65–99)
Glucose-Capillary: 107 mg/dL — ABNORMAL HIGH (ref 65–99)

## 2017-06-11 SURGERY — ICD IMPLANT
Anesthesia: LOCAL

## 2017-06-11 MED ORDER — METFORMIN HCL 500 MG PO TABS
1000.0000 mg | ORAL_TABLET | Freq: Two times a day (BID) | ORAL | Status: DC
  Administered 2017-06-11 – 2017-06-12 (×2): 1000 mg via ORAL
  Filled 2017-06-11 (×2): qty 2

## 2017-06-11 MED ORDER — SODIUM CHLORIDE 0.9% FLUSH: 3.0000 mL | INTRAVENOUS | Status: DC | PRN

## 2017-06-11 MED ORDER — MIDAZOLAM HCL 5 MG/5ML IJ SOLN
INTRAMUSCULAR | Status: AC
  Filled 2017-06-11: qty 5

## 2017-06-11 MED ORDER — GLIPIZIDE 10 MG PO TABS
10.0000 mg | ORAL_TABLET | Freq: Two times a day (BID) | ORAL | Status: DC
  Administered 2017-06-11 – 2017-06-12 (×2): 10 mg via ORAL
  Filled 2017-06-11 (×2): qty 1

## 2017-06-11 MED ORDER — PANTOPRAZOLE SODIUM 40 MG PO TBEC
40.0000 mg | DELAYED_RELEASE_TABLET | Freq: Every day | ORAL | Status: DC
  Administered 2017-06-11 – 2017-06-12 (×2): 40 mg via ORAL
  Filled 2017-06-11 (×2): qty 1

## 2017-06-11 MED ORDER — SIMVASTATIN 20 MG PO TABS
20.0000 mg | ORAL_TABLET | Freq: Every day | ORAL | Status: DC
  Administered 2017-06-11: 20 mg via ORAL
  Filled 2017-06-11: qty 1

## 2017-06-11 MED ORDER — CO Q 10 10 MG PO CAPS: 300.0000 mg | ORAL_CAPSULE | Freq: Every day | ORAL | Status: DC

## 2017-06-11 MED ORDER — FENTANYL CITRATE (PF) 100 MCG/2ML IJ SOLN
INTRAMUSCULAR | Status: AC
  Filled 2017-06-11: qty 2

## 2017-06-11 MED ORDER — CEFAZOLIN SODIUM-DEXTROSE 1-4 GM/50ML-% IV SOLN
1.0000 g | Freq: Four times a day (QID) | INTRAVENOUS | Status: AC
  Administered 2017-06-11 – 2017-06-12 (×3): 1 g via INTRAVENOUS
  Filled 2017-06-11 (×3): qty 50

## 2017-06-11 MED ORDER — TURMERIC 450 MG PO CAPS: 450.0000 mg | ORAL_CAPSULE | Freq: Every day | ORAL | Status: DC

## 2017-06-11 MED ORDER — LIDOCAINE HCL (PF) 1 % IJ SOLN
INTRAMUSCULAR | Status: DC | PRN
  Administered 2017-06-11: 60 mL

## 2017-06-11 MED ORDER — SODIUM CHLORIDE 0.9% FLUSH: 3.0000 mL | Freq: Two times a day (BID) | INTRAVENOUS | Status: DC

## 2017-06-11 MED ORDER — HEPARIN (PORCINE) IN NACL 2-0.9 UNIT/ML-% IJ SOLN
INTRAMUSCULAR | Status: AC
  Filled 2017-06-11: qty 500

## 2017-06-11 MED ORDER — ASPIRIN EC 81 MG PO TBEC
81.0000 mg | DELAYED_RELEASE_TABLET | Freq: Every day | ORAL | Status: DC
  Administered 2017-06-11 – 2017-06-12 (×2): 81 mg via ORAL
  Filled 2017-06-11 (×2): qty 1

## 2017-06-11 MED ORDER — ADULT MULTIVITAMIN W/MINERALS CH
1.0000 | ORAL_TABLET | Freq: Every day | ORAL | Status: DC
  Administered 2017-06-11 – 2017-06-12 (×2): 1 via ORAL
  Filled 2017-06-11 (×2): qty 1

## 2017-06-11 MED ORDER — ACETAMINOPHEN 325 MG PO TABS: 325.0000 mg | ORAL_TABLET | ORAL | Status: DC | PRN

## 2017-06-11 MED ORDER — SODIUM CHLORIDE 0.9 % IV SOLN: 250.0000 mL | INTRAVENOUS | Status: DC

## 2017-06-11 MED ORDER — GENTAMICIN SULFATE 40 MG/ML IJ SOLN
80.0000 mg | INTRAMUSCULAR | Status: AC
  Administered 2017-06-11: 80 mg

## 2017-06-11 MED ORDER — SODIUM CHLORIDE 0.9 % IR SOLN
Status: AC
  Filled 2017-06-11: qty 2

## 2017-06-11 MED ORDER — VITAMIN E 45 MG (100 UNIT) PO CAPS
1000.0000 [IU] | ORAL_CAPSULE | ORAL | Status: DC
  Administered 2017-06-11: 1000 [IU] via ORAL
  Filled 2017-06-11: qty 2

## 2017-06-11 MED ORDER — APIXABAN 5 MG PO TABS
5.0000 mg | ORAL_TABLET | Freq: Two times a day (BID) | ORAL | Status: DC
  Administered 2017-06-11 – 2017-06-12 (×3): 5 mg via ORAL
  Filled 2017-06-11 (×3): qty 1

## 2017-06-11 MED ORDER — SODIUM CHLORIDE 0.9 % IV SOLN
INTRAVENOUS | Status: DC
  Administered 2017-06-11: 07:00:00 via INTRAVENOUS

## 2017-06-11 MED ORDER — CEFAZOLIN SODIUM-DEXTROSE 2-4 GM/100ML-% IV SOLN
INTRAVENOUS | Status: AC
  Filled 2017-06-11: qty 100

## 2017-06-11 MED ORDER — VITAMIN D 1000 UNITS PO TABS
2000.0000 [IU] | ORAL_TABLET | Freq: Every day | ORAL | Status: DC
  Administered 2017-06-11 – 2017-06-12 (×2): 2000 [IU] via ORAL
  Filled 2017-06-11 (×2): qty 2

## 2017-06-11 MED ORDER — VITAMIN C 500 MG PO TABS
500.0000 mg | ORAL_TABLET | Freq: Every day | ORAL | Status: DC
  Administered 2017-06-11 – 2017-06-12 (×2): 500 mg via ORAL
  Filled 2017-06-11 (×2): qty 1

## 2017-06-11 MED ORDER — MUPIROCIN 2 % EX OINT
TOPICAL_OINTMENT | Freq: Once | CUTANEOUS | Status: AC
  Administered 2017-06-11: 1 via NASAL
  Filled 2017-06-11: qty 22

## 2017-06-11 MED ORDER — LIDOCAINE HCL (PF) 1 % IJ SOLN
INTRAMUSCULAR | Status: AC
  Filled 2017-06-11: qty 60

## 2017-06-11 MED ORDER — METOPROLOL SUCCINATE ER 25 MG PO TB24
25.0000 mg | ORAL_TABLET | Freq: Every day | ORAL | Status: DC
  Administered 2017-06-11 – 2017-06-12 (×2): 25 mg via ORAL
  Filled 2017-06-11 (×2): qty 1

## 2017-06-11 MED ORDER — HEPARIN (PORCINE) IN NACL 2-0.9 UNIT/ML-% IJ SOLN
INTRAMUSCULAR | Status: AC | PRN
  Administered 2017-06-11: 500 mL

## 2017-06-11 MED ORDER — MIDAZOLAM HCL 5 MG/5ML IJ SOLN
INTRAMUSCULAR | Status: DC | PRN
  Administered 2017-06-11: 1 mg via INTRAVENOUS
  Administered 2017-06-11: 0.5 mg via INTRAVENOUS
  Administered 2017-06-11: 1 mg via INTRAVENOUS
  Administered 2017-06-11: 0.5 mg via INTRAVENOUS

## 2017-06-11 MED ORDER — FENTANYL CITRATE (PF) 100 MCG/2ML IJ SOLN
INTRAMUSCULAR | Status: DC | PRN
  Administered 2017-06-11: 12.5 ug via INTRAVENOUS
  Administered 2017-06-11 (×2): 25 ug via INTRAVENOUS
  Administered 2017-06-11: 12.5 ug via INTRAVENOUS

## 2017-06-11 MED ORDER — CALCIUM CARBONATE-VITAMIN D 500-200 MG-UNIT PO TABS
1.0000 | ORAL_TABLET | Freq: Every day | ORAL | Status: DC
  Administered 2017-06-11 – 2017-06-12 (×2): 1 via ORAL
  Filled 2017-06-11 (×2): qty 1

## 2017-06-11 MED ORDER — FUROSEMIDE 20 MG PO TABS
20.0000 mg | ORAL_TABLET | Freq: Every day | ORAL | Status: DC
  Administered 2017-06-11 – 2017-06-12 (×2): 20 mg via ORAL
  Filled 2017-06-11 (×2): qty 1

## 2017-06-11 MED ORDER — MUPIROCIN 2 % EX OINT
TOPICAL_OINTMENT | CUTANEOUS | Status: AC
  Filled 2017-06-11: qty 22

## 2017-06-11 MED ORDER — SACUBITRIL-VALSARTAN 24-26 MG PO TABS
1.0000 | ORAL_TABLET | Freq: Two times a day (BID) | ORAL | Status: DC
  Administered 2017-06-11 – 2017-06-12 (×3): 1 via ORAL
  Filled 2017-06-11 (×4): qty 1

## 2017-06-11 MED ORDER — CEFAZOLIN SODIUM-DEXTROSE 2-4 GM/100ML-% IV SOLN
2.0000 g | INTRAVENOUS | Status: AC
  Administered 2017-06-11: 2 g via INTRAVENOUS

## 2017-06-11 MED ORDER — ONDANSETRON HCL 4 MG/2ML IJ SOLN: 4.0000 mg | Freq: Four times a day (QID) | INTRAMUSCULAR | Status: DC | PRN

## 2017-06-11 MED ORDER — OMEGA-3-ACID ETHYL ESTERS 1 G PO CAPS
1.0000 g | ORAL_CAPSULE | Freq: Every day | ORAL | Status: DC
  Administered 2017-06-11 – 2017-06-12 (×2): 1 g via ORAL
  Filled 2017-06-11 (×2): qty 1

## 2017-06-11 SURGICAL SUPPLY — 6 items
CABLE SURGICAL S-101-97-12 (CABLE) ×1 IMPLANT
ICD ELLIPSE VR CD1411-36Q (ICD Generator) ×1 IMPLANT
LEAD DURATA 7122Q-65CM (Lead) ×1 IMPLANT
PAD DEFIB LIFELINK (PAD) ×1 IMPLANT
SHEATH CLASSIC 7F (SHEATH) ×1 IMPLANT
TRAY PACEMAKER INSERTION (PACKS) ×1 IMPLANT

## 2017-06-11 NOTE — Discharge Instructions (Signed)
° ° °  Supplemental Discharge Instructions for  Pacemaker/Defibrillator Patients  Activity No heavy lifting or vigorous activity with your left/right arm for 6 to 8 weeks.  Do not raise your left/right arm above your head for one week.  Gradually raise your affected arm as drawn below.              06/15/17                    05/19/17                     06/17/17                  06/18/17 __  NO DRIVING for  1 week  ; you may begin driving on   6/97/94  .  WOUND CARE - Keep the wound area clean and dry.  Do not get this area wet, no showers until cleared to at your wound check visit - The tape/steri-strips on your wound will fall off; do not pull them off.  No bandage is needed on the site.  DO  NOT apply any creams, oils, or ointments to the wound area. - If you notice any drainage or discharge from the wound, any swelling or bruising at the site, or you develop a fever > 101? F after you are discharged home, call the office at once.  Special Instructions - You are still able to use cellular telephones; use the ear opposite the side where you have your pacemaker/defibrillator.  Avoid carrying your cellular phone near your device. - When traveling through airports, show security personnel your identification card to avoid being screened in the metal detectors.  Ask the security personnel to use the hand wand. - Avoid arc welding equipment, MRI testing (magnetic resonance imaging), TENS units (transcutaneous nerve stimulators).  Call the office for questions about other devices. - Avoid electrical appliances that are in poor condition or are not properly grounded. - Microwave ovens are safe to be near or to operate.  Additional information for defibrillator patients should your device go off: - If your device goes off ONCE and you feel fine afterward, notify the device clinic nurses. - If your device goes off ONCE and you do not feel well afterward, call 911. - If your device goes off TWICE,  call 911. - If your device goes off THREE times in one day, call 911.  DO NOT DRIVE YOURSELF OR A FAMILY MEMBER WITH A DEFIBRILLATOR TO THE HOSPITAL--CALL 911.

## 2017-06-11 NOTE — Discharge Summary (Addendum)
ELECTROPHYSIOLOGY PROCEDURE DISCHARGE SUMMARY    Patient ID: [name].,  MRN: [id number], DOB/AGE: [birth year] 82 y.o.  Admit date: 06/11/2017 Discharge date: 06/12/17  Primary Care Physician: Shirline Frees, MD  Primary Cardiologist: Dr. Angelena Form Electrophysiologist: Dr. Curt Bears  Primary Discharge Diagnosis:  1. ICM  Secondary Discharge Diagnosis:  1. PAFib     CHA2DS2Vasc is 5, on Eliquis 2. CAD 3. HTN 4. DM 5. PVD      Carotid disease   No Known Allergies   Procedures This Admission:  1.  Implantation of a SJM single chamber ICD on 06/11/17 by Dr Curt Bears.  The patient received a Converse, model 7122Q-65 (serial number [id number]) right ventricular defibrillator lead [place] KY7062-37S (serial  Number [id number]) ICD DFT's were deferred at time of implant.   There were no immediate post procedure complications. 2.  CXR on demonstrated no pneumothorax status post device implantation.   Brief HPI: Chaddrick Brue. is a 82 y.o. male was referred to electrophysiology in the outpatient setting for consideration of ICD implantation.  Past medical history is noted above.  The patient has persistent LV dysfunction despite guideline directed therapy.  Risks, benefits, and alternatives to ICD implantation were reviewed with the patient who wished to proceed.   Hospital Course:  The patient was admitted and underwent implantation of an ICD with details as outlined above. He was monitored on telemetry overnight which demonstrated AFib w/CVR.  Left chest was without hematoma or ecchymosis.  The device was interrogated and found to be functioning normally.  CXR was obtained and demonstrated no pneumothorax status post device implantation.   It did note a moderate R sided effusion/consolidation, reviewed with/by Dr. Curt Bears.  The patient has no symptoms of illness, no rest SOB, is at his same exertional capacity for years, denies cough.  He is  afebrile, pre-op labs with WBC wnl.   Last cxr available to compare to is from 2016.  We Alahna Dunne have him take his lasix BID for 3 days and plan a repeat cxr in 1 week.  Wound care, arm mobility, and restrictions were reviewed with the patient.  The patient feels well, denies any CP, palpitations, or SOB, he was examined by Dr. Curt Bears and considered stable for discharge to home.   The patient's discharge medications include an ARB (Entresto) and beta blocker (metoprolol).   Physical Exam: Vitals:   06/11/17 1319 06/11/17 2015 06/12/17 0026 06/12/17 0559  BP: 135/78 (!) 143/64 (!) 147/68 123/76  Pulse: 75 66 76 72  Resp:  18 18 18   Temp: 97.6 F (36.4 C) 98.4 F (36.9 C) 98.1 F (36.7 C) 97.8 F (36.6 C)  TempSrc: Oral Oral Oral Oral  SpO2: 100% 98% 98% 98%  Weight: 197 lb (89.4 kg)   193 lb 9.6 oz (87.8 kg)  Height: 6' (1.829 m)       GEN- The patient is well appearing, alert and oriented x 3 today.   HEENT: normocephalic, atraumatic; sclera clear, conjunctiva pink; hearing intact; oropharynx clear Lungs- CTA b/l, normal work of breathing.  No wheezes, rales, rhonchi, diminished R mid lung down Heart- iRRR, no murmurs, rubs or gallops, PMI not laterally displaced GI- soft, non-tender, non-distended Extremities- no clubbing, cyanosis, trace edema MS- no significant deformity or atrophy Skin- warm and dry, no rash or lesion, left chest without hematoma/ecchymosis Psych- euthymic mood, full affect Neuro- no gross defecits  Labs:   Lab Results  Component  Value Date   WBC 8.5 05/31/2017   HGB 12.9 (L) 05/31/2017   HCT 38.4 05/31/2017   MCV 90 05/31/2017   PLT 250 05/31/2017   No results for input(s): NA, K, CL, CO2, BUN, CREATININE, CALCIUM, PROT, BILITOT, ALKPHOS, ALT, AST, GLUCOSE in the last 168 hours.  Invalid input(s): LABALBU  Discharge Medications:  Allergies as of 06/12/2017   No Known Allergies     Medication List    TAKE these medications   apixaban 5 MG Tabs  tablet Commonly known as:  ELIQUIS Take 1 tablet (5 mg total) by mouth 2 (two) times daily.   aspirin 81 MG tablet Take 81 mg by mouth daily.   calcium-vitamin D 500-200 MG-UNIT tablet Commonly known as:  OSCAL WITH D Take 1 tablet by mouth daily.   CO Q 10 PO Take 300 mg by mouth daily.   fish oil-omega-3 fatty acids 1000 MG capsule Take 1 g by mouth daily.   furosemide 20 MG tablet Commonly known as:  LASIX Take 20 mg by mouth daily. Notes to patient:  Take your furosemide 20mg  (one tablet) twice daily for 3 days, then resume once daily   glipiZIDE 10 MG tablet Commonly known as:  GLUCOTROL Take 10 mg by mouth 2 (two) times daily before a meal.   metFORMIN 500 MG tablet Commonly known as:  GLUCOPHAGE Take 2 tablets (1,000 mg total) by mouth 2 (two) times daily with a meal.   metoprolol succinate 25 MG 24 hr tablet Commonly known as:  TOPROL-XL Take 1 tablet (25 mg total) by mouth daily.   MULTI-DAY VITAMINS PO Take 1 tablet by mouth daily.   omeprazole 20 MG capsule Commonly known as:  PRILOSEC Take 20 mg by mouth 2 (two) times daily before a meal.   sacubitril-valsartan 24-26 MG Commonly known as:  ENTRESTO Take 1 tablet by mouth 2 (two) times daily.   simvastatin 20 MG tablet Commonly known as:  ZOCOR Take 20 mg by mouth daily at 6 PM.   Turmeric 450 MG Caps Take 450 mg by mouth daily.   vitamin C 500 MG tablet Commonly known as:  ASCORBIC ACID Take 500 mg by mouth daily.   Vitamin D3 2000 units Tabs Take 2,000 Units by mouth daily.   vitamin E 1000 UNIT capsule Take 1,000 Units by mouth every other day.       Disposition:  Home  Discharge Instructions    Diet - low sodium heart healthy   Complete by:  As directed    Increase activity slowly   Complete by:  As directed      Follow-up Information    [place] [facility] Follow up on 06/25/2017.   Specialty:  Cardiology Why:  2:00PM, wound check visit Contact information: [address] [place]       Constance Haw, MD Follow up on 09/13/2017.   Specialty:  Cardiology Why:  2:30PM Contact information: [address] [place] [address] [phone]        [facility] Follow up on 06/19/2017.   Why:  Radiology department, you may arrive for your xray anytime, though must be prior to 4:00PM Contact information: [place] [id number] [phone]          Duration of Discharge Encounter: Greater than 30 minutes including physician time.  SignedTommye Standard, PA-C 06/12/2017 10:05 AM  I have  seen and examined this patient with Tommye Standard.  Agree with above, note added to reflect my findings.  On exam, iRRR, no murmurs, his breath sounds at the right base.  Saint Jude ICD implanted yesterday for systolic heart failure.  Interrogation without abnormality.  Chest x-ray shows pleural effusions on the right side.  This was not present on chest x-ray 2-1/2 years ago.  We Lanny Donoso plan to increase his Lasix for the next few days followed by a repeat chest x-ray next week.  Should repeat chest x-ray show further effusions, would consider either drainage versus referral to pulmonology.  Quavion Boule M. Sai Zinn MD 06/12/2017 10:53 AM

## 2017-06-11 NOTE — Progress Notes (Signed)
ICD Criteria  Current LVEF:25%. Within 12 months prior to implant: Yes   Heart failure history: Yes, Class II  Cardiomyopathy history: Yes, Ischemic Cardiomyopathy - Prior MI.  Atrial Fibrillation/Atrial Flutter: Yes, Persistent (> 7 Days).  Ventricular tachycardia history: No.  Cardiac arrest history: No.  History of syndromes with risk of sudden death: No.  Previous ICD: No.  Current ICD indication: Primary  PPM indication: No.   Class I or II Bradycardia indication present: No  Beta Blocker therapy for 3 or more months: Yes, prescribed.   Ace Inhibitor/ARB therapy for 3 or more months: Yes, prescribed.

## 2017-06-11 NOTE — H&P (Signed)
[name]. has presented today for surgery, with the diagnosis of CHF.  The various methods of treatment have been discussed with the patient and family. After consideration of risks, benefits and other options for treatment, the patient has consented to  Procedure(s): ICD implant as a surgical intervention .  Risks include but not limited to bleeding, tamponade, infection, pneumothorax, among others. The patient's history has been reviewed, patient examined, no change in status, stable for surgery.  I have reviewed the patient's chart and labs.  Questions were answered to the patient's satisfaction.    [name] Curt Bears, MD 06/11/2017 7:13 AM

## 2017-06-12 ENCOUNTER — Other Ambulatory Visit: Payer: Self-pay | Admitting: Physician Assistant

## 2017-06-12 ENCOUNTER — Ambulatory Visit (HOSPITAL_COMMUNITY): Payer: PPO

## 2017-06-12 DIAGNOSIS — Z7982 Long term (current) use of aspirin: Secondary | ICD-10-CM | POA: Diagnosis not present

## 2017-06-12 DIAGNOSIS — I252 Old myocardial infarction: Secondary | ICD-10-CM | POA: Diagnosis not present

## 2017-06-12 DIAGNOSIS — J90 Pleural effusion, not elsewhere classified: Secondary | ICD-10-CM

## 2017-06-12 DIAGNOSIS — I255 Ischemic cardiomyopathy: Secondary | ICD-10-CM | POA: Diagnosis not present

## 2017-06-12 DIAGNOSIS — I2510 Atherosclerotic heart disease of native coronary artery without angina pectoris: Secondary | ICD-10-CM | POA: Diagnosis not present

## 2017-06-12 DIAGNOSIS — Z006 Encounter for examination for normal comparison and control in clinical research program: Secondary | ICD-10-CM | POA: Diagnosis not present

## 2017-06-12 DIAGNOSIS — I509 Heart failure, unspecified: Secondary | ICD-10-CM | POA: Diagnosis not present

## 2017-06-12 DIAGNOSIS — Z79899 Other long term (current) drug therapy: Secondary | ICD-10-CM | POA: Diagnosis not present

## 2017-06-12 DIAGNOSIS — Z95810 Presence of automatic (implantable) cardiac defibrillator: Secondary | ICD-10-CM | POA: Diagnosis not present

## 2017-06-12 DIAGNOSIS — E1151 Type 2 diabetes mellitus with diabetic peripheral angiopathy without gangrene: Secondary | ICD-10-CM | POA: Diagnosis not present

## 2017-06-12 DIAGNOSIS — I110 Hypertensive heart disease with heart failure: Secondary | ICD-10-CM | POA: Diagnosis not present

## 2017-06-12 DIAGNOSIS — Z7984 Long term (current) use of oral hypoglycemic drugs: Secondary | ICD-10-CM | POA: Diagnosis not present

## 2017-06-12 DIAGNOSIS — Z7901 Long term (current) use of anticoagulants: Secondary | ICD-10-CM | POA: Diagnosis not present

## 2017-06-12 DIAGNOSIS — I481 Persistent atrial fibrillation: Secondary | ICD-10-CM | POA: Diagnosis not present

## 2017-06-12 NOTE — Plan of Care (Signed)
  Education: Knowledge of General Education information will improve 06/12/2017 0055 - Progressing by Theador Hawthorne, RN   Clinical Measurements: Ability to maintain clinical measurements within normal limits will improve 06/12/2017 0055 - Progressing by Theador Hawthorne, RN   Clinical Measurements: Will remain free from infection 06/12/2017 0055 - Progressing by Theador Hawthorne, RN   Activity: Risk for activity intolerance will decrease 06/12/2017 0055 - Progressing by Theador Hawthorne, RN   Pain Managment: General experience of comfort will improve 06/12/2017 0055 - Progressing by Theador Hawthorne, RN

## 2017-06-12 NOTE — Consult Note (Signed)
   [facility] CM Inpatient Consult   06/12/2017  Zohar Maroney 07-Jul-1935 [id number]  Patient screened for [facility] services. Admitted with HF snd had ICD placed. Patient is in the [place] of the [facility]. Met with the patient, his wife and daughter at the bedside. Explained benefits of [facility] Care Management.  Patient states he is doing better.  Has concerns for getting in the Medi gap (donut) this year with his medications.  He verbally accepted the post hospital follow up calls. Primary Care Provider:  Shirline Frees [facility].  This office provides the follow up transition of care call.  Patient is agreeable to EMMI general follow up calls.  Patient accepted a brochure, 24 hour nurse advise line magnet and contact information.  For questions, please contact:   Please place a [place] [facility] consult or for questions contact:   Natividad Brood, RN BSN [facility] Liaison  [phone] business mobile phone Toll free office [phone]

## 2017-06-19 ENCOUNTER — Ambulatory Visit (HOSPITAL_COMMUNITY)
Admission: RE | Admit: 2017-06-19 | Discharge: 2017-06-19 | Disposition: A | Discharge status: Home / Self Care | Payer: PPO | Source: Ambulatory Visit | Attending: Physician Assistant | Admitting: Physician Assistant

## 2017-06-19 DIAGNOSIS — H35371 Puckering of macula, right eye: Secondary | ICD-10-CM | POA: Diagnosis not present

## 2017-06-19 DIAGNOSIS — J90 Pleural effusion, not elsewhere classified: Secondary | ICD-10-CM | POA: Diagnosis not present

## 2017-06-19 DIAGNOSIS — E113593 Type 2 diabetes mellitus with proliferative diabetic retinopathy without macular edema, bilateral: Secondary | ICD-10-CM | POA: Diagnosis not present

## 2017-06-19 DIAGNOSIS — H43812 Vitreous degeneration, left eye: Secondary | ICD-10-CM | POA: Diagnosis not present

## 2017-06-25 ENCOUNTER — Ambulatory Visit (INDEPENDENT_AMBULATORY_CARE_PROVIDER_SITE_OTHER): Payer: PPO | Admitting: *Deleted

## 2017-06-25 DIAGNOSIS — I255 Ischemic cardiomyopathy: Secondary | ICD-10-CM

## 2017-06-25 DIAGNOSIS — Z95810 Presence of automatic (implantable) cardiac defibrillator: Secondary | ICD-10-CM

## 2017-06-25 DIAGNOSIS — I481 Persistent atrial fibrillation: Secondary | ICD-10-CM

## 2017-06-25 DIAGNOSIS — I4819 Other persistent atrial fibrillation: Secondary | ICD-10-CM

## 2017-06-25 LAB — CUP PACEART INCLINIC DEVICE CHECK
Battery Remaining Longevity: 99 mo
Brady Statistic RV Percent Paced: 0.06 %
Date Time Interrogation Session: 20190402172618
HighPow Impedance: 60.75 Ohm
Implantable Lead Implant Date: 20190319
Implantable Lead Location: 753860
Implantable Pulse Generator Implant Date: 20190319
Lead Channel Impedance Value: 462.5 Ohm
Lead Channel Pacing Threshold Amplitude: 0.75 V
Lead Channel Pacing Threshold Pulse Width: 0.5 ms
Lead Channel Sensing Intrinsic Amplitude: 12 mV
Lead Channel Setting Pacing Amplitude: 3.5 V
Lead Channel Setting Pacing Pulse Width: 0.5 ms
Lead Channel Setting Sensing Sensitivity: 0.5 mV
Pulse Gen Serial Number: 9811279

## 2017-06-25 NOTE — Patient Instructions (Signed)
Medication Instructions:  HOLD (do not take) Eliquis for 7 days.   Labwork: none  Testing/Procedures: none  Follow-Up: Wound recheck on Monday, 07/01/17 at 2:00pm.  Call the [facility] at [phone] if you note any signs/symptoms of infection, including drainage, redness, swelling, fever/chills.   If you need a refill on your cardiac medications before your next appointment, please call your pharmacy.

## 2017-06-25 NOTE — Progress Notes (Signed)
Wound check appointment. Steri-strips removed. Wound without redness. Moderate hematoma noted, firm to palpation. Incision edges approximated and healing well. Patient denies fevers/chills, or drainage. WC saw patient, advised holding Eliquis x7 days, wound recheck on 07/01/17 while WC in the office. Patient and wife agreeable to plan, aware to call if any signs/symptoms of infection noted.  Normal device function. Thresholds, sensing, and impedances consistent with implant measurements. Device programmed at 3.5V for extra safety margin until 3 month visit. Histogram distribution appropriate for patient and level of activity. No mode switches or ventricular arrhythmias noted. Patient educated about wound care, arm mobility, lifting restrictions, shock plan, and Merlin monitor. ROV with WC on 09/13/17.  Patient gave verbal permission to include the following photo:

## 2017-06-26 ENCOUNTER — Telehealth: Payer: Self-pay | Admitting: Physician Assistant

## 2017-06-26 ENCOUNTER — Telehealth: Payer: Self-pay | Admitting: *Deleted

## 2017-06-26 NOTE — Telephone Encounter (Signed)
LMOVM  OF NORMAL RESULTS AND CLINIC NUMBER TO  CALL BACK  IF ANY QUESTIONS

## 2017-06-26 NOTE — Telephone Encounter (Signed)
New Message ° ° ° °Pt is returning call  °

## 2017-07-01 ENCOUNTER — Ambulatory Visit (INDEPENDENT_AMBULATORY_CARE_PROVIDER_SITE_OTHER): Payer: Self-pay | Admitting: *Deleted

## 2017-07-01 DIAGNOSIS — I255 Ischemic cardiomyopathy: Secondary | ICD-10-CM

## 2017-07-01 DIAGNOSIS — Z95810 Presence of automatic (implantable) cardiac defibrillator: Secondary | ICD-10-CM

## 2017-07-01 NOTE — Progress Notes (Signed)
Wound recheck appointment.  Wound without redness.  Incision edges remain approximated and healing well.  Moderate hematoma still present, smaller in size and softer to palpation than at last check on 06/25/17.  Patient denies fevers/chills, drainage, or site pain.  Patient held Eliquis as instructed.  Dr. Curt Bears assessed site, recommended resuming Eliquis 5mg  BID and continue to monitor for signs/symptoms of infection.  Patient and wife are agreeable to plan, aware to call with any worsening symptoms.  Wound recheck on 07/08/17 while Dr. Curt Bears is in the office.

## 2017-07-01 NOTE — Patient Instructions (Signed)
Medication Instructions:  Resume Eliquis.  Take 1 tablet (5 mg total) by mouth 2 (two) times daily  Labwork: [name]   Testing/Procedures: [name]  Follow-Up: Wound recheck on Monday, 07/08/17 at 11:00am.   If you need a refill on your cardiac medications before your next appointment, please call your pharmacy.

## 2017-07-08 ENCOUNTER — Ambulatory Visit (INDEPENDENT_AMBULATORY_CARE_PROVIDER_SITE_OTHER): Payer: Self-pay | Admitting: *Deleted

## 2017-07-08 ENCOUNTER — Telehealth: Payer: Self-pay | Admitting: *Deleted

## 2017-07-08 DIAGNOSIS — I255 Ischemic cardiomyopathy: Secondary | ICD-10-CM

## 2017-07-08 DIAGNOSIS — J90 Pleural effusion, not elsewhere classified: Secondary | ICD-10-CM

## 2017-07-08 NOTE — Progress Notes (Signed)
ROV for a hematoma recheck. Wound well healed without redness or edema. Incision edges approximated. Wound assessed by Dr.Camnitz, who states that hematoma is visibly smaller in size. No further changes recommended at this time. Patient to follow up as scheduled.

## 2017-07-08 NOTE — Telephone Encounter (Signed)
Pt seen today in device clinic. Dr. Curt Bears spoke to patient and informed him that we would be placing referral to Pulmonology for pleural effusion. Pt aware someone will call to arrange this. Referral order placed in EPIC.

## 2017-07-18 NOTE — Telephone Encounter (Signed)
Pt scheduled to see [facility], Dr. Vaughan Browner on 08/02/17

## 2017-07-24 ENCOUNTER — Other Ambulatory Visit: Payer: Self-pay | Admitting: Cardiovascular Disease

## 2017-08-01 DIAGNOSIS — H60332 Swimmer's ear, left ear: Secondary | ICD-10-CM | POA: Diagnosis not present

## 2017-08-02 ENCOUNTER — Encounter: Payer: Self-pay | Admitting: Pulmonary Disease

## 2017-08-02 ENCOUNTER — Ambulatory Visit (INDEPENDENT_AMBULATORY_CARE_PROVIDER_SITE_OTHER)
Admission: RE | Admit: 2017-08-02 | Discharge: 2017-08-02 | Disposition: A | Discharge status: Home / Self Care | Payer: PPO | Source: Ambulatory Visit | Attending: Pulmonary Disease | Admitting: Pulmonary Disease

## 2017-08-02 ENCOUNTER — Ambulatory Visit: Payer: PPO | Admitting: Pulmonary Disease

## 2017-08-02 VITALS — BP 140/74 | HR 80 | Ht 72.0 in | Wt 191.0 lb

## 2017-08-02 DIAGNOSIS — J90 Pleural effusion, not elsewhere classified: Secondary | ICD-10-CM | POA: Diagnosis not present

## 2017-08-02 DIAGNOSIS — J9811 Atelectasis: Secondary | ICD-10-CM | POA: Diagnosis not present

## 2017-08-02 NOTE — Progress Notes (Signed)
[name]    [id number]    [birth year]  Primary Care Physician:Harris, Gwyndolyn Saxon, MD  Referring Physician: Shirline Frees, MD [address] [place], [place] [address]  Chief complaint: Consult for pleural effusion  HPI: 82 year old with A fib, CHF status post ICD implantation.  Right effusion was incidentally  noted on chest x-ray at the time of ICD placement in March 2019.  Lasix dose was increased and he had a follow-up chest x-ray 1 week later which showed effusion has improved but not completely cleared.  He has been referred to pulmonary for further evaluation.  Complains of chronic dyspnea on exertion without any change.  He has some cough, nonproductive in nature with no mucus, fevers, chills.  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  Quit in 1972 Travel history: No significant travel  Outpatient Encounter Medications as of 08/02/2017  Medication Sig  . apixaban (ELIQUIS) 5 MG TABS tablet Take 1 tablet (5 mg total) by mouth 2 (two) times daily.  [name] aspirin 81 MG tablet Take 81 mg by mouth daily.    . calcium-vitamin D (OSCAL WITH D) 500-200 MG-UNIT per tablet Take 1 tablet by mouth daily.   . Cholecalciferol (VITAMIN D3) 2000 UNITS TABS Take 2,000 Units by mouth daily.   . Coenzyme Q10 (CO Q 10 PO) Take 300 mg by mouth daily.  [name] ENTRESTO 24-26 MG TAKE 1 TABLET BY MOUTH TWICE DAILY  . fish oil-omega-3 fatty acids 1000 MG capsule Take 1 g by mouth daily.    . furosemide (LASIX) 20 MG tablet Take 20 mg by mouth daily.  [name] glipiZIDE (GLUCOTROL) 10 MG tablet Take 10 mg by mouth 2 (two) times daily before a meal.  . metFORMIN (GLUCOPHAGE) 500 MG tablet Take 2 tablets (1,000 mg total) by mouth 2 (two) times daily with a meal.  . metoprolol succinate (TOPROL-XL) 25 MG 24 hr tablet Take 1  tablet (25 mg total) by mouth daily.  . Multiple Vitamin (MULTI-DAY VITAMINS PO) Take 1 tablet by mouth daily.   [name] omeprazole (PRILOSEC) 20 MG capsule Take 20 mg by mouth 2 (two) times daily before a meal.   . simvastatin (ZOCOR) 20 MG tablet Take 20 mg by mouth daily at 6 PM.   . Turmeric 450 MG CAPS Take 450 mg by mouth daily.   . vitamin C (ASCORBIC ACID) 500 MG tablet Take 500 mg by mouth daily.   . vitamin E 1000 UNIT capsule Take 1,000 Units by mouth every other day.   No facility-administered encounter medications on file as of 08/02/2017.     Allergies as of 08/02/2017  . (No Known Allergies)    Past Medical History:  Diagnosis Date  . ANEMIA, HX OF   . Bronchial pneumonia Jan. 2016  . CAD, ARTERY BYPASS GRAFT   . CAROTID ARTERY STENOSIS   . Cataract   . DIABETES MELLITUS, TYPE II   . Diabetic retinal damage of right eye ([place]) 2015  . Diverticulosis   . HYPERCHOLESTEROLEMIA   . HYPERTENSION   . Myocardial infarction [facility]) June 2007  . New onset atrial fibrillation ([place]) 06/24/2014  . PULMONARY HYPERTENSION   . RENAL INSUFFICIENCY, CHRONIC   . UNSPECIFIED THROMBOCYTOPENIA     Past Surgical History:  Procedure Laterality Date  .  CARDIAC CATHETERIZATION  06/24/2014  . CATARACT EXTRACTION  2012   right eye  . COLONOSCOPY    . CORONARY ARTERY BYPASS GRAFT      quad bypass/2007  . EYE SURGERY    . ICD IMPLANT [name] 06/11/2017   Procedure: ICD IMPLANT;  Surgeon: Constance Haw, MD;  Location: [facility];  Service: Cardiovascular;  Laterality: [name];  . LEFT HEART CATHETERIZATION WITH CORONARY/GRAFT ANGIOGRAM [name] 06/24/2014   Procedure: LEFT HEART CATHETERIZATION WITH Beatrix Fetters;  Surgeon: Burnell Blanks, MD;  Location: [facility] CATH LAB;  Service: Cardiovascular;  Laterality: [name];  . POLYPECTOMY    . TONSILLECTOMY      Family History  Problem Relation Age of Onset  . Heart disease Father 79       Heart disease before age 12  . Heart attack  Father   . Hypertension Father   . Hyperlipidemia Father   . Varicose Veins Father   . Stroke Father        ? not sure  . Colon cancer Maternal Aunt   . Colon cancer Maternal Uncle   . Diverticulitis Unknown   . Heart attack Unknown     Social History   Socioeconomic History  . Marital status: Married    Spouse name: Not on file  . Number of children: 2  . Years of education: Not on file  . Highest education level: Not on file  Occupational History  . Occupation: Horticulturist, commercial: RETIRED  Social Needs  . Financial resource strain: Not on file  . Food insecurity:    Worry: Not on file    Inability: Not on file  . Transportation needs:    Medical: Not on file    Non-medical: Not on file  Tobacco Use  . Smoking status: Former Smoker    Types: Cigarettes    Last attempt to quit: 03/26/1970    Years since quitting: 47.3  . Smokeless tobacco: Never Used  Substance and Sexual Activity  . Alcohol use: Yes    Alcohol/week: 0.6 oz    Types: 1 Glasses of wine per week  . Drug use: No  . Sexual activity: Not on file  Lifestyle  . Physical activity:    Days per week: Not on file    Minutes per session: Not on file  . Stress: Not on file  Relationships  . Social connections:    Talks on phone: Not on file    Gets together: Not on file    Attends religious service: Not on file    Active member of club or organization: Not on file    Attends meetings of clubs or organizations: Not on file    Relationship status: Not on file  . Intimate partner violence:    Fear of current or ex partner: Not on file    Emotionally abused: Not on file    Physically abused: Not on file    Forced sexual activity: Not on file  Other Topics Concern  . Not on file  Social History Narrative  . Not on file    Review of systems: Review of Systems  Constitutional: Negative for fever and chills.  HENT: Negative.   Eyes: Negative for blurred vision.  Respiratory: as per HPI    Cardiovascular: Negative for chest pain and palpitations.  Gastrointestinal: Negative for vomiting, diarrhea, blood per rectum. Genitourinary: Negative for dysuria, urgency, frequency and hematuria.  Musculoskeletal: Negative for myalgias, back pain and joint  pain.  Skin: Negative for itching and rash.  Neurological: Negative for dizziness, tremors, focal weakness, seizures and loss of consciousness.  Endo/Heme/Allergies: Negative for environmental allergies.  Psychiatric/Behavioral: Negative for depression, suicidal ideas and hallucinations.  All other systems reviewed and are negative.  Physical Exam: Blood pressure 140/74, pulse 80, height 6' (1.829 m), weight 191 lb (86.6 kg), SpO2 100 %. Gen:      No acute distress HEENT:  EOMI, sclera anicteric Neck:     No masses; no thyromegaly Lungs:    Diminished breath sounds on the right CV:         Irregular Abd:      + bowel sounds; soft, non-tender; no palpable masses, no distension Ext:    No edema; adequate peripheral perfusion Skin:      Warm and dry; no rash Neuro: alert and oriented x 3 Psych: normal mood and affect  Data Reviewed: Chest x-ray 04/13/2014-clear lungs Chest x-ray 06/12/2017-moderate right effusion with right middle lobe, right lower lobe consolidation Chest x-ray 06/19/2017- improvement in right effusion. I have reviewed the images personally.  Assessment:  Right pleural effusion, Noted to have a unilateral right effusion of unclear etiology.  Could be cardiac in nature although would be unusual to have it just on the right side Follow-up chest x-ray after Lasix shows some improvement by my review  We will get a chest x-ray today.  If effusion is persistent then we will schedule thoracentesis for evaluation.  His Eliquis will need to be held in case he needs a procedure.  Plan/Recommendations: - Chest x-ray.  Scheduled thoracentesis if there is persistent effusion.  Marshell Garfinkel MD [facility] 08/02/2017, 11:01 AM  CC: Shirline Frees, MD

## 2017-08-02 NOTE — Patient Instructions (Signed)
We will get a chest x-ray today to evaluate the fluid around your lung If it still present I will have to discuss with the cardiologist about stopping the blood thinner and get a procedure to drain the fluid Follow-up in 1 to 2 months.

## 2017-08-07 ENCOUNTER — Telehealth: Payer: Self-pay | Admitting: Cardiovascular Disease

## 2017-08-07 ENCOUNTER — Telehealth: Payer: Self-pay | Admitting: Pulmonary Disease

## 2017-08-07 NOTE — Telephone Encounter (Signed)
Called and spoke with pt and was able to answer all the questions he had.  Stated to pt once we hear from Dr. Macky Lower office if it was okay for pt to hold Eliquis 2-3 days prior to procedure we would then place order for thoracentesis to be done and we would call him with all necessary information at that time.  Pt expressed understanding. Nothing further needed at this time.

## 2017-08-07 NOTE — Telephone Encounter (Signed)
New Message      [facility] Pre-operative Risk Assessment    Request for surgical clearance:  1. What type of surgery is being performed? Thoracentesis  2. When is this surgery scheduled? TBD   3. What type of clearance is required (medical clearance vs. Pharmacy clearance to hold med vs. Both)? Pharmacy  Are there any medications that need to be held prior to surgery and how long? Eliquis 2-3 days prior to procedure  4. Practice name and name of physician performing surgery? [facility] Dr. Concepcion Living  5. What is your office phone number [phone]   7.   What is your office fax number [phone]  8.   Anesthesia type (None, local, MAC, general) ? general   [name] 08/07/2017, 10:07 AM  _________________________________________________________________   (provider comments below)

## 2017-08-07 NOTE — Telephone Encounter (Signed)
Pt takes Eliquis for afib with CHADS2VASc score of 6 (age x2, HTN, CAD, CHF, DM). SCr 1.35, CrCl is 105mL/min. Ok to hold Eliquis for 2-3 days prior to procedure as requested.

## 2017-08-08 NOTE — Telephone Encounter (Signed)
   Primary Cardiologist: [name], MD  Chart reviewed as part of pre-operative protocol coverage. Patient was contacted 08/08/2017 in reference to pre-operative risk assessment for pending surgery as outlined below. I spoke with pt today and he is doing well w/o cardiac symptoms. No exertional CP.   Therefore, based on ACC/AHA guidelines, the patient would be at acceptable risk for the planned procedure without further cardiovascular testing.   Per pharmacy, it is ok to hold Eliquis for 2-3 days prior to procedure as requested.   I will route this recommendation to the requesting party via Epic fax function and remove from pre-op pool.  Please call with questions.  Lyda Jester, PA-C 08/08/2017, 4:41 PM

## 2017-08-08 NOTE — Telephone Encounter (Signed)
   Primary Cardiologist: [name], MD  Chart reviewed as part of pre-operative protocol coverage. Patient was contacted 08/08/2017 in reference to pre-operative risk assessment for pending surgery as outlined below.  [name]. was last seen on 05/27/17 by Dr. Angelena Form.  Phone call was placed to check for any new cardiac symptoms that may warrant office visit or w/u prior to procedure under general anesthesia. Message was left for pt to call preop clinic back.    Lyda Jester, PA-C 08/08/2017, 1:48 PM

## 2017-08-09 ENCOUNTER — Other Ambulatory Visit: Payer: Self-pay | Admitting: Pulmonary Disease

## 2017-08-09 ENCOUNTER — Telehealth: Payer: Self-pay | Admitting: Cardiovascular Disease

## 2017-08-09 DIAGNOSIS — J90 Pleural effusion, not elsewhere classified: Secondary | ICD-10-CM

## 2017-08-09 NOTE — Telephone Encounter (Signed)
Follow Up:   Margie checking on the pt's clearance for his Eliquis. Please fax this clearanceto [phone].

## 2017-08-13 ENCOUNTER — Ambulatory Visit (HOSPITAL_COMMUNITY)
Admission: RE | Admit: 2017-08-13 | Discharge: 2017-08-13 | Disposition: A | Discharge status: Home / Self Care | Payer: PPO | Source: Ambulatory Visit | Attending: Radiology | Admitting: Radiology

## 2017-08-13 ENCOUNTER — Ambulatory Visit (HOSPITAL_COMMUNITY)
Admission: RE | Admit: 2017-08-13 | Discharge: 2017-08-13 | Disposition: A | Discharge status: Home / Self Care | Payer: PPO | Source: Ambulatory Visit | Attending: Pulmonary Disease | Admitting: Pulmonary Disease

## 2017-08-13 DIAGNOSIS — J90 Pleural effusion, not elsewhere classified: Secondary | ICD-10-CM | POA: Diagnosis not present

## 2017-08-13 DIAGNOSIS — Z9889 Other specified postprocedural states: Secondary | ICD-10-CM

## 2017-08-13 DIAGNOSIS — R846 Abnormal cytological findings in specimens from respiratory organs and thorax: Secondary | ICD-10-CM | POA: Diagnosis not present

## 2017-08-13 LAB — AMYLASE, PLEURAL OR PERITONEAL FLUID: Amylase, Fluid: 32 U/L

## 2017-08-13 LAB — BODY FLUID CELL COUNT WITH DIFFERENTIAL
Eos, Fluid: 0 %
Lymphs, Fluid: 71 %
Monocyte-Macrophage-Serous Fluid: 25 % — ABNORMAL LOW (ref 50–90)
Neutrophil Count, Fluid: 4 % (ref 0–25)
Total Nucleated Cell Count, Fluid: 236 cu mm (ref 0–1000)

## 2017-08-13 LAB — PROTEIN, PLEURAL OR PERITONEAL FLUID: Total protein, fluid: 3.1 g/dL

## 2017-08-13 LAB — LACTATE DEHYDROGENASE, PLEURAL OR PERITONEAL FLUID: LD, Fluid: 51 U/L — ABNORMAL HIGH (ref 3–23)

## 2017-08-13 LAB — GRAM STAIN

## 2017-08-13 LAB — ALBUMIN, PLEURAL OR PERITONEAL FLUID: Albumin, Fluid: 2.2 g/dL

## 2017-08-13 MED ORDER — LIDOCAINE HCL 1 % IJ SOLN
INTRAMUSCULAR | Status: AC
  Filled 2017-08-13: qty 20

## 2017-08-13 NOTE — Procedures (Signed)
Ultrasound-guided diagnostic and therapeutic right thoracentesis performed yielding 2 liters of yellow fluid. No immediate complications. Follow-up chest x-ray pending.  Due to some patient chest tightness and this being  initial thoracentesis only the above amount of fluid was removed today.

## 2017-08-14 LAB — PH, BODY FLUID: pH, Body Fluid: 7.6

## 2017-08-14 LAB — TRIGLYCERIDES, BODY FLUIDS: Triglycerides, Fluid: 11 mg/dL

## 2017-08-14 NOTE — Telephone Encounter (Signed)
Send previous clearance via Epic.

## 2017-08-18 LAB — CULTURE, BODY FLUID W GRAM STAIN -BOTTLE: Culture: NO GROWTH

## 2017-08-29 ENCOUNTER — Telehealth: Payer: Self-pay | Admitting: Pulmonary Disease

## 2017-08-29 NOTE — Telephone Encounter (Signed)
Called and spoke with patient, he is aware of results and verbalized understanding.

## 2017-09-10 ENCOUNTER — Ambulatory Visit: Payer: PPO | Admitting: Pulmonary Disease

## 2017-09-10 ENCOUNTER — Encounter: Payer: Self-pay | Admitting: Pulmonary Disease

## 2017-09-10 VITALS — BP 138/78 | HR 61 | Ht 72.0 in | Wt 194.8 lb

## 2017-09-10 DIAGNOSIS — J90 Pleural effusion, not elsewhere classified: Secondary | ICD-10-CM

## 2017-09-10 NOTE — Progress Notes (Signed)
[name]    [id number]    May 04, 1935  Primary Care Physician:Harris, Gwyndolyn Saxon, MD  Referring Physician: Shirline Frees, MD [place], [place] [address]  Chief complaint: Follow up for pleural effusion  HPI: 82 year old with A fib, CHF status post ICD implantation.  Right effusion was incidentally  noted on chest x-ray at the time of ICD placement in March 2019.  Lasix dose was increased and he had a follow-up chest x-ray 1 week later which showed effusion has improved but not completely cleared.  He has been referred to pulmonary for further evaluation.  Complains of chronic dyspnea on exertion without any change.  He has some cough, nonproductive in nature with no mucus, fevers, chills.  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  Quit in 1972 Travel history: No significant travel  Interim history: Underwent IR ultrasound-guided thoracentesis on 5/21.  Results show transudative effusion Cultures and cytology are negative.  States that breathing is much improved after thoracentesis.  Outpatient Encounter Medications as of 09/10/2017  Medication Sig  . apixaban (ELIQUIS) 5 MG TABS tablet Take 1 tablet (5 mg total) by mouth 2 (two) times daily.  [name] aspirin 81 MG tablet Take 81 mg by mouth daily.    . calcium-vitamin D (OSCAL WITH D) 500-200 MG-UNIT per tablet Take 1 tablet by mouth daily.   . Cholecalciferol (VITAMIN D3) 2000 UNITS TABS Take 2,000 Units by mouth daily.   . Coenzyme Q10 (CO Q 10 PO) Take 300 mg by mouth daily.  [name] ENTRESTO 24-26 MG TAKE 1 TABLET BY MOUTH TWICE DAILY  . fish oil-omega-3 fatty acids 1000 MG capsule Take 1 g by mouth daily.    . furosemide (LASIX) 20 MG tablet Take 20 mg by mouth daily.  [name] glipiZIDE (GLUCOTROL) 10 MG tablet Take 10 mg by mouth 2  (two) times daily before a meal.  . metFORMIN (GLUCOPHAGE) 500 MG tablet Take 2 tablets (1,000 mg total) by mouth 2 (two) times daily with a meal.  . metoprolol succinate (TOPROL-XL) 25 MG 24 hr tablet Take 1 tablet (25 mg total) by mouth daily.  . Multiple Vitamin (MULTI-DAY VITAMINS PO) Take 1 tablet by mouth daily.   [name] omeprazole (PRILOSEC) 20 MG capsule Take 20 mg by mouth 2 (two) times daily before a meal.   . simvastatin (ZOCOR) 20 MG tablet Take 20 mg by mouth daily at 6 PM.   . Turmeric 450 MG CAPS Take 450 mg by mouth daily.   . vitamin C (ASCORBIC ACID) 500 MG tablet Take 500 mg by mouth daily.   . vitamin E 1000 UNIT capsule Take 1,000 Units by mouth every other day.   No facility-administered encounter medications on file as of 09/10/2017.     Allergies as of 09/10/2017  . (No Known Allergies)    Past Medical History:  Diagnosis Date  . ANEMIA, HX OF   . Bronchial pneumonia Jan. 2016  . CAD, ARTERY BYPASS GRAFT   . CAROTID ARTERY STENOSIS   . Cataract   . DIABETES MELLITUS, TYPE II   . Diabetic retinal damage of right eye ([place]) 2015  . Diverticulosis   . HYPERCHOLESTEROLEMIA   . HYPERTENSION   . Myocardial infarction [facility]) June 2007  . New onset atrial fibrillation ([place])  06/24/2014  . PULMONARY HYPERTENSION   . RENAL INSUFFICIENCY, CHRONIC   . UNSPECIFIED THROMBOCYTOPENIA     Past Surgical History:  Procedure Laterality Date  . CARDIAC CATHETERIZATION  06/24/2014  . CATARACT EXTRACTION  2012   right eye  . COLONOSCOPY    . CORONARY ARTERY BYPASS GRAFT      quad bypass/2007  . EYE SURGERY    . ICD IMPLANT [name] 06/11/2017   Procedure: ICD IMPLANT;  Surgeon: Constance Haw, MD;  Location: [facility];  Service: Cardiovascular;  Laterality: [name];  . LEFT HEART CATHETERIZATION WITH CORONARY/GRAFT ANGIOGRAM [name] 06/24/2014   Procedure: LEFT HEART CATHETERIZATION WITH Beatrix Fetters;  Surgeon: Burnell Blanks, MD;  Location: [facility] CATH LAB;   Service: Cardiovascular;  Laterality: [name];  . POLYPECTOMY    . TONSILLECTOMY      Family History  Problem Relation Age of Onset  . Heart disease Father 43       Heart disease before age 51  . Heart attack Father   . Hypertension Father   . Hyperlipidemia Father   . Varicose Veins Father   . Stroke Father        ? not sure  . Colon cancer Maternal Aunt   . Colon cancer Maternal Uncle   . Diverticulitis Unknown   . Heart attack Unknown     Social History   Socioeconomic History  . Marital status: Married    Spouse name: Not on file  . Number of children: 2  . Years of education: Not on file  . Highest education level: Not on file  Occupational History  . Occupation: Horticulturist, commercial: RETIRED  Social Needs  . Financial resource strain: Not on file  . Food insecurity:    Worry: Not on file    Inability: Not on file  . Transportation needs:    Medical: Not on file    Non-medical: Not on file  Tobacco Use  . Smoking status: Former Smoker    Types: Cigarettes    Last attempt to quit: 03/26/1970    Years since quitting: 47.4  . Smokeless tobacco: Never Used  Substance and Sexual Activity  . Alcohol use: Yes    Alcohol/week: 0.6 oz    Types: 1 Glasses of wine per week  . Drug use: No  . Sexual activity: Not on file  Lifestyle  . Physical activity:    Days per week: Not on file    Minutes per session: Not on file  . Stress: Not on file  Relationships  . Social connections:    Talks on phone: Not on file    Gets together: Not on file    Attends religious service: Not on file    Active member of club or organization: Not on file    Attends meetings of clubs or organizations: Not on file    Relationship status: Not on file  . Intimate partner violence:    Fear of current or ex partner: Not on file    Emotionally abused: Not on file    Physically abused: Not on file    Forced sexual activity: Not on file  Other Topics Concern  . Not on file  Social  History Narrative  . Not on file    Review of systems: Review of Systems  Constitutional: Negative for fever and chills.  HENT: Negative.   Eyes: Negative for blurred vision.  Respiratory: as per HPI  Cardiovascular: Negative for chest pain  and palpitations.  Gastrointestinal: Negative for vomiting, diarrhea, blood per rectum. Genitourinary: Negative for dysuria, urgency, frequency and hematuria.  Musculoskeletal: Negative for myalgias, back pain and joint pain.  Skin: Negative for itching and rash.  Neurological: Negative for dizziness, tremors, focal weakness, seizures and loss of consciousness.  Endo/Heme/Allergies: Negative for environmental allergies.  Psychiatric/Behavioral: Negative for depression, suicidal ideas and hallucinations.  All other systems reviewed and are negative.  Physical Exam: Blood pressure 140/74, pulse 80, height 6' (1.829 m), weight 191 lb (86.6 kg), SpO2 100 %. Gen:      No acute distress HEENT:  EOMI, sclera anicteric Neck:     No masses; no thyromegaly Lungs:    Diminished breath sounds on the right CV:         Irregular Abd:      + bowel sounds; soft, non-tender; no palpable masses, no distension Ext:    No edema; adequate peripheral perfusion Skin:      Warm and dry; no rash Neuro: alert and oriented x 3 Psych: normal mood and affect  Data Reviewed: Chest x-ray 04/13/2014-clear lungs Chest x-ray 06/12/2017-moderate right effusion with right middle lobe, right lower lobe consolidation Chest x-ray 06/19/2017- improvement in right effusion. Chest x-ray 08/23/2017- small right effusion after thoracentesis.  No pneumothorax. I have reviewed the images personally.  Thoracentesis 08/13/2017 LDH 51 Total protein 3.1  Cell count-WBC 236, 71% lymphs, 25% monocyte macrophage Microbiology-negative Cytology- reactive mesothelial cells, and lymphoid tissue.  Assessment:  Right pleural effusion, Noted to have a unilateral right effusion of unclear  etiology.  Could be cardiac in nature although would be unusual to have it just on the right side Pleural labs reviewed.  Effusion is likely transudative with negative cultures and cytology We will continue to follow this Get CT chest to make sure there is no underlying lung mass or other abnormality.   Plan/Recommendations: - CT chest without contrast.  Marshell Garfinkel MD [facility] 09/10/2017, 10:46 AM  CC: Shirline Frees, MD

## 2017-09-10 NOTE — Patient Instructions (Signed)
I have reviewed your tests which show mild inflammation.  There is no evidence of malignancy on the test so far I am glad that your breathing is better We will schedule you for a CT chest without contrast to get a better look at the lung Follow-up in 3 months with repeat chest x-ray.

## 2017-09-13 ENCOUNTER — Encounter: Payer: Self-pay | Admitting: Cardiology

## 2017-09-13 ENCOUNTER — Ambulatory Visit: Payer: PPO | Admitting: Cardiology

## 2017-09-13 ENCOUNTER — Ambulatory Visit (INDEPENDENT_AMBULATORY_CARE_PROVIDER_SITE_OTHER)
Admission: RE | Admit: 2017-09-13 | Discharge: 2017-09-13 | Disposition: A | Discharge status: Home / Self Care | Payer: PPO | Source: Ambulatory Visit | Attending: Pulmonary Disease | Admitting: Pulmonary Disease

## 2017-09-13 VITALS — BP 120/60 | HR 68 | Ht 72.0 in | Wt 195.4 lb

## 2017-09-13 DIAGNOSIS — J90 Pleural effusion, not elsewhere classified: Secondary | ICD-10-CM

## 2017-09-13 DIAGNOSIS — I481 Persistent atrial fibrillation: Secondary | ICD-10-CM

## 2017-09-13 DIAGNOSIS — J9811 Atelectasis: Secondary | ICD-10-CM | POA: Diagnosis not present

## 2017-09-13 DIAGNOSIS — I255 Ischemic cardiomyopathy: Secondary | ICD-10-CM

## 2017-09-13 DIAGNOSIS — I2510 Atherosclerotic heart disease of native coronary artery without angina pectoris: Secondary | ICD-10-CM

## 2017-09-13 DIAGNOSIS — I10 Essential (primary) hypertension: Secondary | ICD-10-CM | POA: Diagnosis not present

## 2017-09-13 DIAGNOSIS — I4819 Other persistent atrial fibrillation: Secondary | ICD-10-CM

## 2017-09-13 NOTE — Patient Instructions (Addendum)
Medication Instructions:  Your physician recommends that you continue on your current medications as directed. Please refer to the Current Medication list given to you today.  *If you need a refill on your cardiac medications before your next appointment, please call your pharmacy*  Labwork: None ordered  Testing/Procedures: None ordered  Follow-Up: Remote monitoring is used to monitor your Pacemaker or ICD from home. This monitoring reduces the number of office visits required to check your device to one time per year. It allows us to keep an eye on the functioning of your device to ensure it is working properly. You are scheduled for a device check from home on 12/16/2017. You may send your transmission at any time that day. If you have a wireless device, the transmission will be sent automatically. After your physician reviews your transmission, you will receive a postcard with your next transmission date.  Your physician wants you to follow-up in: 9 months with Dr. Camnitz.  You will receive a reminder letter in the mail two months in advance. If you don't receive a letter, please call our office to schedule the follow-up appointment.  Thank you for choosing [facility]!!   Ludy Messamore, RN [phone]     

## 2017-09-13 NOTE — Progress Notes (Signed)
Electrophysiology Office Note   Date:  09/13/2017   ID:  [name], DOB March 14, 1936, MRN [id number]  PCP:  Shirline Frees, MD  Cardiologist:  Angelena Form Primary Electrophysiologist:  Aleyssa Pike Meredith Leeds, MD    Chief Complaint  Patient presents with  . Defib Check    ischemic cardiomyopathy/Chronic systolic HF/Persistent Afib     History of Present Illness: [name]. is a 82 y.o. male who is being seen today for the evaluation of ischemic cardiomyopathy at the request of [name]. Presenting today for electrophysiology evaluation.  Has a history of atrial fibrillation, ischemic cardiomyopathy, diabetes, hypertension, hyperlipidemia, coronary disease status post four-vessel CABG in 2007, carotid artery disease.  He has had a low ejection fractions in 2016.  In 2016 showed severe native vessel disease with 4 patent bypass grafts.  He was started on Eliquis due to atrial fibrillation.  He had a [place] ICD implanted 06/11/2017.     Today, denies symptoms of palpitations, chest pain, shortness of breath, orthopnea, PND, lower extremity edema, claudication, dizziness, presyncope, syncope, bleeding, or neurologic sequela. The patient is tolerating medications without difficulties.  Overall, he is doing well.  He had a right pleural effusion that was found on chest x-ray incidentally after his ICD was implanted.  It is currently being worked up by pulmonary.  Since its drainage, he is done much better with quite a bit less shortness of breath and improved energy.   Past Medical History:  Diagnosis Date  . ANEMIA, HX OF   . Bronchial pneumonia Jan. 2016  . CAD, ARTERY BYPASS GRAFT   . CAROTID ARTERY STENOSIS   . Cataract   . DIABETES MELLITUS, TYPE II   . Diabetic retinal damage of right eye ([place]) 2015  . Diverticulosis   . HYPERCHOLESTEROLEMIA   . HYPERTENSION   . Myocardial infarction [facility]) June 2007  . New onset atrial fibrillation ([place]) 06/24/2014  .  PULMONARY HYPERTENSION   . RENAL INSUFFICIENCY, CHRONIC   . UNSPECIFIED THROMBOCYTOPENIA    Past Surgical History:  Procedure Laterality Date  . CARDIAC CATHETERIZATION  06/24/2014  . CATARACT EXTRACTION  2012   right eye  . COLONOSCOPY    . CORONARY ARTERY BYPASS GRAFT      quad bypass/2007  . EYE SURGERY    . ICD IMPLANT [name] 06/11/2017   Procedure: ICD IMPLANT;  Surgeon: Constance Haw, MD;  Location: [facility];  Service: Cardiovascular;  Laterality: [name];  . LEFT HEART CATHETERIZATION WITH CORONARY/GRAFT ANGIOGRAM [name] 06/24/2014   Procedure: LEFT HEART CATHETERIZATION WITH Beatrix Fetters;  Surgeon: Burnell Blanks, MD;  Location[facility];  Service: Cardiovascular;  Laterality: [name];  . POLYPECTOMY    . TONSILLECTOMY       Current Outpatient Medications  Medication Sig Dispense Refill  . apixaban (ELIQUIS) 5 MG TABS tablet Take 1 tablet (5 mg total) by mouth 2 (two) times daily. 60 tablet 11  . aspirin 81 MG tablet Take 81 mg by mouth daily.      . calcium-vitamin D (OSCAL WITH D) 500-200 MG-UNIT per tablet Take 1 tablet by mouth daily.     . Cholecalciferol (VITAMIN D3) 2000 UNITS TABS Take 2,000 Units by mouth daily.     . Coenzyme Q10 (CO Q 10 PO) Take 300 mg by mouth daily.    [name] ENTRESTO 24-26 MG TAKE 1 TABLET BY MOUTH TWICE DAILY 180 tablet 2  . fish oil-omega-3 fatty acids 1000 MG  capsule Take 1 g by mouth daily.      . furosemide (LASIX) 20 MG tablet Take 20 mg by mouth daily.  0  . glipiZIDE (GLUCOTROL) 10 MG tablet Take 10 mg by mouth 2 (two) times daily before a meal.    . metFORMIN (GLUCOPHAGE) 500 MG tablet Take 2 tablets (1,000 mg total) by mouth 2 (two) times daily with a meal.    . metoprolol succinate (TOPROL-XL) 25 MG 24 hr tablet Take 1 tablet (25 mg total) by mouth daily. 90 tablet 3  . Multiple Vitamin (MULTI-DAY VITAMINS PO) Take 1 tablet by mouth daily.     [name] omeprazole (PRILOSEC) 20 MG capsule Take 20 mg by mouth 2 (two)  times daily before a meal.     . simvastatin (ZOCOR) 20 MG tablet Take 20 mg by mouth daily at 6 PM.     . Turmeric 450 MG CAPS Take 450 mg by mouth daily.     . vitamin C (ASCORBIC ACID) 500 MG tablet Take 500 mg by mouth daily.     . vitamin E 1000 UNIT capsule Take 1,000 Units by mouth every other day.     No current facility-administered medications for this visit.     Allergies:   Patient has no known allergies.   Social History:  The patient  reports that he quit smoking about 47 years ago. His smoking use included cigarettes. He has never used smokeless tobacco. He reports that he drinks about 0.6 oz of alcohol per week. He reports that he does not use drugs.   Family History:  The patient's family history includes Colon cancer in his maternal aunt and maternal uncle; Diverticulitis in his unknown relative; Heart attack in his father and unknown relative; Heart disease (age of onset: 74) in his father; Hyperlipidemia in his father; Hypertension in his father; Stroke in his father; Varicose Veins in his father.   ROS:  Please see the history of present illness.   Otherwise, review of systems is positive for none.   All other systems are reviewed and negative.   PHYSICAL EXAM: VS:  BP 120/60   Pulse 68   Ht 6' (1.829 m)   Wt 195 lb 6.4 oz (88.6 kg)   SpO2 (!) 6%   BMI 26.50 kg/m  , BMI Body mass index is 26.5 kg/m. GEN: Well nourished, well developed, in no acute distress  HEENT: normal  Neck: no JVD, carotid bruits, or masses Cardiac: iRRR; no murmurs, rubs, or gallops,no edema  Respiratory:  clear to auscultation bilaterally, normal work of breathing GI: soft, nontender, nondistended, + BS MS: no deformity or atrophy  Skin: warm and dry, device site well healed Neuro:  Strength and sensation are intact Psych: euthymic mood, full affect  EKG:  EKG is ordered today. Personal review of the ekg ordered shows atrial fibrillation, low voltage, rate 68  Personal review of the  device interrogation today. Results in [place]: 05/31/2017: BUN 28; Creatinine, Ser 1.35; Hemoglobin 12.9; Platelets 250; Potassium 4.7; Sodium 142    Lipid Panel  No results found for: CHOL, TRIG, HDL, CHOLHDL, VLDL, LDLCALC, LDLDIRECT   Wt Readings from Last 3 Encounters:  09/13/17 195 lb 6.4 oz (88.6 kg)  09/10/17 194 lb 12.8 oz (88.4 kg)  08/02/17 191 lb (86.6 kg)      Other studies Reviewed: Additional studies/ records that were reviewed today include: TTE 01/17/17  Review of the above records today demonstrates:  -  Left ventricle: Diffuse hypokinesis with septal and apical   akinesis LV apical band no thrombus. The cavity size was severely   dilated. Wall thickness was normal. The estimated ejection   fraction was 25%. Doppler parameters are consistent with both   elevated ventricular end-diastolic filling pressure and elevated   left atrial filling pressure. - Mitral valve: Severely calcified annulus. - Left atrium: The atrium was moderately dilated. - Right atrium: The atrium was moderately dilated. - Atrial septum: No defect or patent foramen ovale was identified. - Pulmonary arteries: PA peak pressure: 56 mm Hg (S).   ASSESSMENT AND PLAN:  1.  Chronic systolic heart failure due to ischemic cardiomyopathy: Early on optimal medical therapy.  Status post [place] ICD 06/11/2017.  Device functioning appropriately.  No changes.  2.  Coronary artery disease without angina: No current angina symptoms.  No changes.  3.  Hypertension: Well-controlled today.  4.  Atrial fibrillation, persistent: Early on Toprol-XL and Eliquis.  Amiodarone has been stopped.  Plan for a rate control strategy.  5.  Pleural effusion: Currently being worked up by pulmonary.  Feeling much improved since it was drained.  This patients CHA2DS2-VASc Score and unadjusted Ischemic Stroke Rate (% per year) is equal to 7.2 % stroke rate/year from a score of 5  Above score calculated  as 1 point each if present [CHF, HTN, DM, Vascular=MI/PAD/Aortic Plaque, Age if 65-74, or Male] Above score calculated as 2 points each if present [Age > 75, or Stroke/TIA/TE]   Current medicines are reviewed at length with the patient today.   The patient does not have concerns regarding his medicines.  The following changes were made today: None  Labs/ tests ordered today include:  Orders Placed This Encounter  Procedures  . EKG 12-Lead     Disposition:   FU with Dayon Witt 9 months  Signed, Daaron Dimarco Meredith Leeds, MD  09/13/2017 12:46 PM     [place] [address] [phone] (office) [phone] (fax)

## 2017-09-25 ENCOUNTER — Other Ambulatory Visit: Payer: Self-pay | Admitting: *Deleted

## 2017-09-25 DIAGNOSIS — J90 Pleural effusion, not elsewhere classified: Secondary | ICD-10-CM

## 2017-10-11 DIAGNOSIS — K219 Gastro-esophageal reflux disease without esophagitis: Secondary | ICD-10-CM | POA: Diagnosis not present

## 2017-10-11 DIAGNOSIS — E1122 Type 2 diabetes mellitus with diabetic chronic kidney disease: Secondary | ICD-10-CM | POA: Diagnosis not present

## 2017-10-11 DIAGNOSIS — I129 Hypertensive chronic kidney disease with stage 1 through stage 4 chronic kidney disease, or unspecified chronic kidney disease: Secondary | ICD-10-CM | POA: Diagnosis not present

## 2017-10-11 DIAGNOSIS — I429 Cardiomyopathy, unspecified: Secondary | ICD-10-CM | POA: Diagnosis not present

## 2017-10-11 DIAGNOSIS — E782 Mixed hyperlipidemia: Secondary | ICD-10-CM | POA: Diagnosis not present

## 2017-10-11 DIAGNOSIS — I10 Essential (primary) hypertension: Secondary | ICD-10-CM | POA: Diagnosis not present

## 2017-10-21 DIAGNOSIS — H6092 Unspecified otitis externa, left ear: Secondary | ICD-10-CM | POA: Diagnosis not present

## 2017-11-07 ENCOUNTER — Ambulatory Visit: Payer: PPO | Admitting: Family

## 2017-11-07 ENCOUNTER — Encounter: Payer: Self-pay | Admitting: Family

## 2017-11-07 ENCOUNTER — Other Ambulatory Visit: Payer: Self-pay

## 2017-11-07 ENCOUNTER — Ambulatory Visit (HOSPITAL_COMMUNITY)
Admission: RE | Admit: 2017-11-07 | Discharge: 2017-11-07 | Disposition: A | Discharge status: Home / Self Care | Payer: PPO | Source: Ambulatory Visit | Attending: Family | Admitting: Family

## 2017-11-07 VITALS — BP 121/63 | HR 71 | Temp 98.1°F | Resp 16 | Ht 72.0 in | Wt 196.0 lb

## 2017-11-07 DIAGNOSIS — I6529 Occlusion and stenosis of unspecified carotid artery: Secondary | ICD-10-CM | POA: Diagnosis not present

## 2017-11-07 DIAGNOSIS — Z87891 Personal history of nicotine dependence: Secondary | ICD-10-CM

## 2017-11-07 DIAGNOSIS — I6523 Occlusion and stenosis of bilateral carotid arteries: Secondary | ICD-10-CM | POA: Diagnosis not present

## 2017-11-07 DIAGNOSIS — I872 Venous insufficiency (chronic) (peripheral): Secondary | ICD-10-CM | POA: Diagnosis not present

## 2017-11-07 NOTE — Progress Notes (Addendum)
Chief Complaint: Follow up Extracranial Carotid Artery Stenosis   History of Present Illness  [name] Winski. is a 82 y.o. male whom Dr. Donnetta Hutching has been monitoring for asymptomatic extracranial internal carotid artery stenosis. He is right-handed. He denies any neurologic deficits specifically no amaurosis fugax, transient ischemic attack or stroke. He reports that he is also stable from a cardiac standpoint. He had his carotid stenosis initially discovered at the time of coronary bypass grafting in 2007 and has had serial ultrasounds since then.  He has not had previous carotid artery intervention. He denies tingling, weakness, or pain in either hand or arm, denies dizziness with raising arms above his head.  He denies claudication symptoms in his legs with walking, denies non healing wounds. He works out at a gym 3days/week.  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 [name]   He has not had much swelling in his legs since he lost weight and has been taking furosemide.  New onset atrial fib March 2016, placed on Eliquis, converted to SR with medications about a month later, remains on Eliquis. He was started on furosemide for LE edema and since developing atrial fib; this has helped his LE edema and his breathing.  He had pneumonia January 2016. He had a pleural effusion drained in May or June 2019.   He had an ICD/pacemaker placement in March 2019 by Dr. Curt Bears.   Pt Diabetic: Yes, states his last A1C wasbetween 6.7, states he has since improved his lifestyle habits Pt smoker: former smoker, quit in 1972   Past Medical History:  Diagnosis Date  . ANEMIA, HX OF   . Bronchial pneumonia Jan. 2016  . CAD, ARTERY BYPASS GRAFT   . CAROTID ARTERY STENOSIS   . Cataract   . DIABETES MELLITUS, TYPE II   . Diabetic retinal damage of right eye ([place]) 2015  . Diverticulosis   . HYPERCHOLESTEROLEMIA   . HYPERTENSION   . Myocardial  infarction [facility]) June 2007  . New onset atrial fibrillation ([place]) 06/24/2014  . PULMONARY HYPERTENSION   . RENAL INSUFFICIENCY, CHRONIC   . UNSPECIFIED THROMBOCYTOPENIA     Social History Social History   Tobacco Use  . Smoking status: Former Smoker    Types: Cigarettes    Last attempt to quit: 03/26/1970    Years since quitting: 47.6  . Smokeless tobacco: Never Used  Substance Use Topics  . Alcohol use: Yes    Alcohol/week: 1.0 standard drinks    Types: 1 Glasses of wine per week  . Drug use: No    Family History Family History  Problem Relation Age of Onset  . Heart disease Father 18       Heart disease before age 21  . Heart attack Father   . Hypertension Father   . Hyperlipidemia Father   . Varicose Veins Father   . Stroke Father        ? not sure  . Colon cancer Maternal Aunt   . Colon cancer Maternal Uncle   . Diverticulitis Unknown   . Heart attack Unknown     Surgical History Past Surgical History:  Procedure Laterality Date  . CARDIAC CATHETERIZATION  06/24/2014  . CATARACT EXTRACTION  2012   right eye  . COLONOSCOPY    . CORONARY ARTERY BYPASS GRAFT      quad bypass/2007  . EYE SURGERY    . ICD IMPLANT [name] 06/11/2017   Procedure: ICD  IMPLANT;  Surgeon: Constance Haw, MD;  Location: [facility];  Service: Cardiovascular;  Laterality: [name];  . LEFT HEART CATHETERIZATION WITH CORONARY/GRAFT ANGIOGRAM [name] 06/24/2014   Procedure: LEFT HEART CATHETERIZATION WITH Beatrix Fetters;  Surgeon: Burnell Blanks, MD;  Location[facility] CATH LAB;  Service: Cardiovascular;  Laterality: [name];  . POLYPECTOMY    . TONSILLECTOMY      No Known Allergies  Current Outpatient Medications  Medication Sig Dispense Refill  . apixaban (ELIQUIS) 5 MG TABS tablet Take 1 tablet (5 mg total) by mouth 2 (two) times daily. 60 tablet 11  . aspirin 81 MG tablet Take 81 mg by mouth daily.      . calcium-vitamin D (OSCAL WITH D) 500-200 MG-UNIT per tablet Take 1  tablet by mouth daily.     . Cholecalciferol (VITAMIN D3) 2000 UNITS TABS Take 2,000 Units by mouth daily.     . Coenzyme Q10 (CO Q 10 PO) Take 300 mg by mouth daily.    [name] ENTRESTO 24-26 MG TAKE 1 TABLET BY MOUTH TWICE DAILY 180 tablet 2  . fish oil-omega-3 fatty acids 1000 MG capsule Take 1 g by mouth daily.      . furosemide (LASIX) 20 MG tablet Take 20 mg by mouth daily.  0  . glipiZIDE (GLUCOTROL) 10 MG tablet Take 10 mg by mouth 2 (two) times daily before a meal.    . metFORMIN (GLUCOPHAGE) 500 MG tablet Take 2 tablets (1,000 mg total) by mouth 2 (two) times daily with a meal.    . metoprolol succinate (TOPROL-XL) 25 MG 24 hr tablet Take 1 tablet (25 mg total) by mouth daily. 90 tablet 3  . Multiple Vitamin (MULTI-DAY VITAMINS PO) Take 1 tablet by mouth daily.     [name] omeprazole (PRILOSEC) 20 MG capsule Take 20 mg by mouth 2 (two) times daily before a meal.     . simvastatin (ZOCOR) 20 MG tablet Take 20 mg by mouth daily at 6 PM.     . Turmeric 450 MG CAPS Take 450 mg by mouth daily.     . vitamin C (ASCORBIC ACID) 500 MG tablet Take 500 mg by mouth daily.     . vitamin E 1000 UNIT capsule Take 1,000 Units by mouth every other day.     No current facility-administered medications for this visit.     Review of Systems : See HPI for pertinent positives and negatives.  Physical Examination  Vitals:   11/07/17 1447 11/07/17 1449  BP: 131/64 121/63  Pulse: 71   Resp: 16   Temp: 98.1 F (36.7 C)   TempSrc: Oral   SpO2: 100%   Weight: 196 lb (88.9 kg)   Height: 6' (1.829 m)    Body mass index is 26.58 kg/m.  General: WDWN male in NAD GAIT:normal HENT: No gross abnormalities  Eyes: PERRLA Pulmonary: Respirations are non-labored, CTAB, no rales, rhonchi, or wheezing. Less air movement in right posterior fields than left. Cardiac: Irregular rhythm, controlled rate, no detected murmur. Pacemaker ICD palpated left upper chest.  VASCULAR EXAM Carotid Bruits Left Right    Negative Negative   Radial pulses are 2+ palpable and equal. Bilateral DP pulses are palpable.      Gastrointestinal: soft, nontender, BS WNL, no r/g,no palpable masses. Musculoskeletal: No muscle atrophy/wasting. M/S 5/5 throughout, Extremities without ischemic changes.Trace bilateral pitting edema in both lower legs with mild chronic venous insufficiency changes. Skin: No rash, no cellulitis, no ulcers.  Neurologic: A&O  X 3;appropriateaffect,speech is normal,CN 2-12 intact, Pain and light touch intact in extremities, Motor exam as listed above Psychiatric: Normal thought content, mood appropriate to clinical situation.     Assessment: [name]. is a 82 y.o. male who has no history of stroke or TIA.   His atherosclerotic risk factors include currently controlled DM, remote history of smoking, CAD, and chronic renal insufficiency.  He takes Eliquis for a history of atrial fib, takes a daily ASA and a statin. He stays physically active.  DATA Carotid Duplex(11-07-17): 60-79% right proximal internal carotid artery stenosis. 1-39% left proximal internal carotid artery stenosis.  Bilateral vertebral artery flow is antegrade.  Bilateral subclavian artery waveforms are normal.  Increased stenosis on the right ICA, decreased in the left ICA, compared to the exam on 10-30-16.    Plan:  Follow-up in 17monthswith Carotid Duplex scan.  I discussed in depth with the patient the nature of atherosclerosis, and emphasized the importance of maximal medical management including strict control of blood pressure, blood glucose, and lipid levels, obtaining regular exercise, and continued cessation of smoking.  The patient is aware that without maximal medical management the underlying atherosclerotic disease process will progress, limiting  the benefit of any interventions. The patient was given information about stroke prevention and what symptoms should prompt the patient to seek immediate medical care. Thank you for allowing [place] to participate in this patient's care.  Clemon Chambers, RN, MSN, FNP-C [facility] Office: [phone]  Clinic Physician: Oneida Alar  11/07/17 2:56 PM

## 2017-11-07 NOTE — Patient Instructions (Addendum)
  To decrease swelling in your feet and legs: Elevate feet above slightly bent knees, feet above heart, overnight and 3-4 times per day for 20 minutes.    Stroke Prevention Some health problems and behaviors may make it more likely for you to have a stroke. Below are ways to lessen your risk of having a stroke.  Be active for at least 30 minutes on most or all days.  Do not smoke. Try not to be around others who smoke.  Do not drink too much alcohol. ? Do not have more than 2 drinks a day if you are a man. ? Do not have more than 1 drink a day if you are a woman and are not pregnant.  Eat healthy foods, such as fruits and vegetables. If you were put on a specific diet, follow the diet as told.  Keep your cholesterol levels under control through diet and medicines. Look for foods that are low in saturated fat, trans fat, cholesterol, and are high in fiber.  If you have diabetes, follow all diet plans and take your medicine as told.  Ask your doctor if you need treatment to lower your blood pressure. If you have high blood pressure (hypertension), follow all diet plans and take your medicine as told by your doctor.  If you are 78-13 years old, have your blood pressure checked every 3-5 years. If you are age 58 or older, have your blood pressure checked every year.  Keep a healthy weight. Eat foods that are low in calories, salt, saturated fat, trans fat, and cholesterol.  Do not take drugs.  Avoid birth control pills, if this applies. Talk to your doctor about the risks of taking birth control pills.  Talk to your doctor if you have sleep problems (sleep apnea).  Take all medicine as told by your doctor. ? You may be told to take aspirin or blood thinner medicine. Take this medicine as told by your doctor. ? Understand your medicine instructions.  Make sure any other conditions you have are being taken care of.  Get help right away if:  You suddenly lose feeling (you feel  numb) or have weakness in your face, arm, or leg.  Your face or eyelid hangs down to one side.  You suddenly feel confused.  You have trouble talking (aphasia) or understanding what people are saying.  You suddenly have trouble seeing in one or both eyes.  You suddenly have trouble walking.  You are dizzy.  You lose your balance or your movements are clumsy (uncoordinated).  You suddenly have a very bad headache and you do not know the cause.  You have new chest pain.  Your heart feels like it is fluttering or skipping a beat (irregular heartbeat). Do not wait to see if the symptoms above go away. Get help right away. Call your local emergency services (911 in [place] Do not drive yourself to the hospital. This information is not intended to replace advice given to you by your health care provider. Make sure you discuss any questions you have with your health care provider. Document Released: 09/11/2011 Document Revised: 08/18/2015 Document Reviewed: 09/12/2012 Elsevier Interactive Patient Education  [employer].

## 2017-11-08 DIAGNOSIS — H4311 Vitreous hemorrhage, right eye: Secondary | ICD-10-CM | POA: Diagnosis not present

## 2017-11-08 DIAGNOSIS — H43812 Vitreous degeneration, left eye: Secondary | ICD-10-CM | POA: Diagnosis not present

## 2017-11-08 DIAGNOSIS — E113593 Type 2 diabetes mellitus with proliferative diabetic retinopathy without macular edema, bilateral: Secondary | ICD-10-CM | POA: Diagnosis not present

## 2017-11-26 DIAGNOSIS — H43812 Vitreous degeneration, left eye: Secondary | ICD-10-CM | POA: Diagnosis not present

## 2017-11-26 DIAGNOSIS — E113593 Type 2 diabetes mellitus with proliferative diabetic retinopathy without macular edema, bilateral: Secondary | ICD-10-CM | POA: Diagnosis not present

## 2017-11-26 DIAGNOSIS — H35371 Puckering of macula, right eye: Secondary | ICD-10-CM | POA: Diagnosis not present

## 2017-12-12 ENCOUNTER — Ambulatory Visit: Payer: PPO | Admitting: Pulmonary Disease

## 2017-12-13 ENCOUNTER — Ambulatory Visit (INDEPENDENT_AMBULATORY_CARE_PROVIDER_SITE_OTHER)
Admission: RE | Admit: 2017-12-13 | Discharge: 2017-12-13 | Disposition: A | Discharge status: Home / Self Care | Payer: PPO | Source: Ambulatory Visit | Attending: Pulmonary Disease | Admitting: Pulmonary Disease

## 2017-12-13 ENCOUNTER — Encounter: Payer: Self-pay | Admitting: Pulmonary Disease

## 2017-12-13 ENCOUNTER — Ambulatory Visit: Payer: PPO | Admitting: Pulmonary Disease

## 2017-12-13 VITALS — BP 144/80 | HR 64 | Ht 69.5 in | Wt 193.0 lb

## 2017-12-13 DIAGNOSIS — J90 Pleural effusion, not elsewhere classified: Secondary | ICD-10-CM

## 2017-12-13 NOTE — Progress Notes (Signed)
         [name]    [id number]    February 19, 1936  Primary Care Physician:Harris, Gwyndolyn Saxon, MD  Referring Physician: Shirline Frees, MD [address], [place] [address]  Chief complaint: Follow up for pleural effusion  HPI: 82 year old with A fib, CHF status post ICD implantation.  Right effusion was incidentally  noted on chest x-ray at the time of ICD placement in March 2019.  Lasix dose was increased and he had a follow-up chest x-ray 1 week later which showed effusion has improved but not completely cleared.  He has been referred to pulmonary for further evaluation.  Underwent IR ultrasound-guided thoracentesis on 08/13/17.  Results show transudative effusion Cultures and cytology are negative.  States that breathing is much improved after thoracentesis.  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  Quit in 1972 Travel history: No significant travel  Interim history: States that breathing is improved after thoracentesis.  No new complaints today.  He has mild dyspnea on exertion, denies any cough, sputum production, fevers, chills.  Physical Exam: Blood pressure (!) 148/68, pulse 68, height 5' 9.5" (1.765 m), weight 87.5 kg, SpO2 98 %. Gen:      No acute distress HEENT:  EOMI, sclera anicteric Neck:     No masses; no thyromegaly Lungs:    Clear to auscultation bilaterally; normal respiratory effort CV:         Regular rate and rhythm; no murmurs Abd:      + bowel sounds; soft, non-tender; no palpable masses, no distension Ext:    No edema; adequate peripheral perfusion Skin:      Warm and dry; no rash Neuro: alert and oriented x 3 Psych: normal mood and affect  Data Reviewed: Chest x-ray 04/13/2014-clear lungs Chest x-ray 06/12/2017-moderate right effusion with right middle lobe, right  lower lobe consolidation Chest x-ray 06/19/2017- improvement in right effusion. Chest x-ray 08/23/2017- small right effusion after thoracentesis.  No pneumothorax. CT chest 09/13/17- moderate right pleural effusion with right lower lobe atelectasis.  No pulmonary mass identified. Chest x-ray 12/23/2017- stable right pleural effusion. I have reviewed the images personally.  Labs Thoracentesis 08/13/2017 LDH 51 Total protein 3.1  Cell count-WBC 236, 71% lymphs, 25% monocyte macrophage Microbiology-negative Cytology- reactive mesothelial cells, and lymphoid tissue.  Assessment:  Right pleural effusion, Noted to have a unilateral right effusion of unclear etiology.  Could be cardiac in nature although would be unusual to have it just on the right side Pleural labs reviewed.  Effusion is likely transudative with negative cultures and cytology CT scan does not show any concerning underlying lung mass  Symptomatically is improved after the thoracentesis.  We will continue to observe this for now If the effusion increases then we may need to do a repeat thoracentesis  Plan/Recommendations: - Follow-up in 6 months with chest x-ray  Marshell Garfinkel MD [facility] 12/13/2017, 9:18 AM  CC: Shirline Frees, MD

## 2017-12-13 NOTE — Patient Instructions (Signed)
I reviewed your x-ray which shows the effusion on the right is stable We will continue to monitor this Follow-up in 6 months with chest x-ray Please call sooner if there is any worsening of your symptoms.

## 2017-12-16 ENCOUNTER — Ambulatory Visit (INDEPENDENT_AMBULATORY_CARE_PROVIDER_SITE_OTHER): Payer: PPO | Admitting: *Deleted

## 2017-12-16 DIAGNOSIS — I255 Ischemic cardiomyopathy: Secondary | ICD-10-CM

## 2017-12-16 NOTE — Progress Notes (Signed)
Remote ICD transmission.   

## 2017-12-17 ENCOUNTER — Encounter: Payer: Self-pay | Admitting: Cardiology

## 2017-12-29 NOTE — Progress Notes (Signed)
Chief Complaint  Patient presents with  . Follow-up    CAD     History of Present Illness: 82 yo male with history of atrial fibrillation, ischemic cardiomyopathy s/p ICD placement, chronic systolic CHF, DM, HTN, HLD, CAD s/p 4V CABG in 2007 and carotid artery disease here today for follow up. He underwent a 4V CABG in 2007 (LIMA to LAD, SVG to OM, SVG sequential to PLA/PDA). Carotid artery disease followed in [facility]. Echo 06/17/14 showed LVEF=25-30%, akinesis of the inferoseptum and apex. No significant valve disease. I arranged a cardiac cath on 05/27/14 which showed severe native vessel CAD and 4 patent bypass grafts. He was noted to be in atrial fibrillation the day of the cardiac cath and was started on Eliquis. He has tolerated the Eliquis well. I saw him in October 2018 and he c/o dyspnea with exertion and worsened fatigue. Echo 01/17/17 with LVEF=25%, diffuse hypokinesis of the septal and apical walls. The LV cavity is severely dilated. Severe MAC. Bilateral atrial enlargement. PA pressure 56 mmHg. He was referred to Dr. Lennie Odor in the EP clinic and an ICD was placed in March 2019. He was incidentally found to have a right pleural effusion at the time of his ICD placement and had thoracentesis May 2019. This has been followed in the pulmonary clinic.   He is here today for follow up. The patient denies any chest pain, dyspnea, palpitations, lower extremity edema, orthopnea, PND, dizziness, near syncope or syncope.   Primary Care Physician: Shirline Frees, MD  Past Medical History:  Diagnosis Date  . ANEMIA, HX OF   . Bronchial pneumonia Jan. 2016  . CAD, ARTERY BYPASS GRAFT   . CAROTID ARTERY STENOSIS   . Cataract   . DIABETES MELLITUS, TYPE II   . Diabetic retinal damage of right eye ([place]) 2015  . Diverticulosis   . HYPERCHOLESTEROLEMIA   . HYPERTENSION   . Myocardial infarction [facility]) June 2007  . New onset atrial fibrillation ([place]) 06/24/2014  . PULMONARY HYPERTENSION   . RENAL  INSUFFICIENCY, CHRONIC   . UNSPECIFIED THROMBOCYTOPENIA     Past Surgical History:  Procedure Laterality Date  . CARDIAC CATHETERIZATION  06/24/2014  . CATARACT EXTRACTION  2012   right eye  . COLONOSCOPY    . CORONARY ARTERY BYPASS GRAFT      quad bypass/2007  . EYE SURGERY    . ICD IMPLANT [name] 06/11/2017   Procedure: ICD IMPLANT;  Surgeon: Constance Haw, MD;  Location: [place] [facility];  Service: Cardiovascular;  Laterality: [name];  . LEFT HEART CATHETERIZATION WITH CORONARY/GRAFT ANGIOGRAM [name] 06/24/2014   Procedure: LEFT HEART CATHETERIZATION WITH Beatrix Fetters;  Surgeon: Burnell Blanks, MD;  Location: [facility] CATH LAB;  Service: Cardiovascular;  Laterality: [name];  . POLYPECTOMY    . TONSILLECTOMY      Current Outpatient Medications  Medication Sig Dispense Refill  . apixaban (ELIQUIS) 5 MG TABS tablet Take 1 tablet (5 mg total) by mouth 2 (two) times daily. 60 tablet 11  . aspirin 81 MG tablet Take 81 mg by mouth daily.      . calcium-vitamin D (OSCAL WITH D) 500-200 MG-UNIT per tablet Take 1 tablet by mouth daily.     . Cholecalciferol (VITAMIN D3) 2000 UNITS TABS Take 2,000 Units by mouth daily.     . Coenzyme Q10 (CO Q 10 PO) Take 300 mg by mouth daily.    [name] ENTRESTO 24-26 MG TAKE 1 TABLET BY MOUTH TWICE DAILY 180 tablet  2  . fish oil-omega-3 fatty acids 1000 MG capsule Take 1 g by mouth daily.      . furosemide (LASIX) 20 MG tablet Take 20 mg by mouth daily.  0  . glipiZIDE (GLUCOTROL) 10 MG tablet Take 10 mg by mouth 2 (two) times daily before a meal.    . metFORMIN (GLUCOPHAGE) 500 MG tablet Take 2 tablets (1,000 mg total) by mouth 2 (two) times daily with a meal.    . metoprolol succinate (TOPROL-XL) 25 MG 24 hr tablet Take 1 tablet (25 mg total) by mouth daily. 90 tablet 3  . Multiple Vitamin (MULTI-DAY VITAMINS PO) Take 1 tablet by mouth daily.     [name] omeprazole (PRILOSEC) 20 MG capsule Take 20 mg by mouth 2 (two) times daily before a meal.     .  simvastatin (ZOCOR) 20 MG tablet Take 20 mg by mouth daily at 6 PM.     . Turmeric 450 MG CAPS Take 450 mg by mouth daily.     . vitamin C (ASCORBIC ACID) 500 MG tablet Take 500 mg by mouth daily.     . vitamin E 1000 UNIT capsule Take 1,000 Units by mouth every other day.     No current facility-administered medications for this visit.     No Known Allergies  Social History   Socioeconomic History  . Marital status: Married    Spouse name: Not on file  . Number of children: 2  . Years of education: Not on file  . Highest education level: Not on file  Occupational History  . Occupation: Horticulturist, commercial: RETIRED  Social Needs  . Financial resource strain: Not on file  . Food insecurity:    Worry: Not on file    Inability: Not on file  . Transportation needs:    Medical: Not on file    Non-medical: Not on file  Tobacco Use  . Smoking status: Former Smoker    Types: Cigarettes    Last attempt to quit: 03/26/1970    Years since quitting: 47.7  . Smokeless tobacco: Never Used  Substance and Sexual Activity  . Alcohol use: Yes    Alcohol/week: 1.0 standard drinks    Types: 1 Glasses of wine per week  . Drug use: No  . Sexual activity: Not on file  Lifestyle  . Physical activity:    Days per week: Not on file    Minutes per session: Not on file  . Stress: Not on file  Relationships  . Social connections:    Talks on phone: Not on file    Gets together: Not on file    Attends religious service: Not on file    Active member of club or organization: Not on file    Attends meetings of clubs or organizations: Not on file    Relationship status: Not on file  . Intimate partner violence:    Fear of current or ex partner: Not on file    Emotionally abused: Not on file    Physically abused: Not on file    Forced sexual activity: Not on file  Other Topics Concern  . Not on file  Social History Narrative  . Not on file    Family History  Problem Relation Age of  Onset  . Heart disease Father 76       Heart disease before age 25  . Heart attack Father   . Hypertension Father   . Hyperlipidemia Father   .  Varicose Veins Father   . Stroke Father        ? not sure  . Colon cancer Maternal Aunt   . Colon cancer Maternal Uncle   . Diverticulitis Unknown   . Heart attack Unknown     Review of Systems:  As stated in the HPI and otherwise negative.   BP 114/60   Pulse 75   Ht 5' 9.5" (1.765 m)   Wt 195 lb 12.8 oz (88.8 kg)   SpO2 99%   BMI 28.50 kg/m   Physical Examination:  General: Well developed, well nourished, NAD  HEENT: OP clear, mucus membranes moist  SKIN: warm, dry. No rashes. Neuro: No focal deficits  Musculoskeletal: Muscle strength 5/5 all ext  Psychiatric: Mood and affect normal  Neck: No JVD, no carotid bruits, no thyromegaly, no lymphadenopathy.  Lungs:Clear bilaterally, no wheezes, rhonci, crackles Cardiovascular: Irreg irreg. No murmurs, gallops or rubs. Abdomen:Soft. Bowel sounds present. Non-tender.  Extremities: No lower extremity edema. Pulses are 2 + in the bilateral DP/PT.  Echo 01/17/17: Left ventricle: Diffuse hypokinesis with septal and apical   akinesis LV apical band no thrombus. The cavity size was severely   dilated. Wall thickness was normal. The estimated ejection   fraction was 25%. Doppler parameters are consistent with both   elevated ventricular end-diastolic filling pressure and elevated   left atrial filling pressure. - Mitral valve: Severely calcified annulus. - Left atrium: The atrium was moderately dilated. - Right atrium: The atrium was moderately dilated. - Atrial septum: No defect or patent foramen ovale was identified. - Pulmonary arteries: PA peak pressure: 56 mm Hg (S).  Cardiac cath 06/24/14: Hemodynamic Findings: Central aortic pressure: 124/62 Left ventricular pressure: 130/3/9 Angiographic Findings: Left main: 30% mid stenosis.  Left Anterior Descending Artery: Large caliber  diffusely diseased vessel that courses to the apex. The proximal vessel has a 95% stenosis. There is a small caliber diagonal branch that is supplied by the proximal LAD.The mid LAD is occluded. The mid and distal LAD fills from the patent IMA graft. There is a moderate caliber septal perforating branch.  Circumflex Artery: Moderate caliber vessel with proximal 80% stenosis, 100% mid occlusion. The obtuse marginal branch is occluded and fills from the patent vein graft.  Right Coronary Artery: Large dominant vessel with diffuse 30% proximal stenosis, 95% mid stenosis, diffuse 30% distal stenosis. There is diffuse 90% distal stenosis just before the bifurcation. The PLA and PDA fill antegrade and from the patent sequential vein grafts.  Graft Anatomy:  SVG to OM is patent. There are two valves in the body of the vein graft and the appearance of this graft is unchanged from last cath in 2010.  SVG sequential to PLA/PDA is patent LIMA to mid LAD is patent. The target LAD vessel is small and diffusely diseased.  Left Ventricular Angiogram: Deferred.   EKG:  EKG is not ordered today. The ekg ordered today demonstrates   Recent Labs: 05/31/2017: BUN 28; Creatinine, Ser 1.35; Hemoglobin 12.9; Platelets 250; Potassium 4.7; Sodium 142   Lipid Panel  Followed in primary care   Wt Readings from Last 3 Encounters:  12/30/17 195 lb 12.8 oz (88.8 kg)  12/13/17 193 lb (87.5 kg)  11/07/17 196 lb (88.9 kg)    Other studies Reviewed: Additional studies/ records that were reviewed today include: . Review of the above records demonstrates:  Assessment and Plan:   1. CAD s/p CABG without angina: No chest pain. Continue ASA, statin and beta blocker.  2. Hyperlipidemia: Lipids followed in primary care. Last LDL 70 in July 2019. Continue statin.   3. Carotid artery disease: Followed in [facility].    4. HTN: BP is well controlled. No changes.   5. Ischemic Cardiomyopathy/Chronic systolic CHF: He has severe  CAD but bypasses stable at cath in 2016. LV systolic function has waxed and waned over the years. His LVEF was 30% prior to his bypass in 2007 and improved to 60% post bypass. Most recent echo October 2018 with LVEF=25%. He is NYHA class 2 and is on a beta blocker and Entresto. ICD now in place. Volume status is ok today. Will continue Lasix.    6. Atrial fibrillation, persistent: He is in atrial fibrillation today. Rate is controlled. Continue Toprol and Eliquis.      Current medicines are reviewed at length with the patient today.  The patient does not have concerns regarding medicines.  The following changes have been made:   Labs/ tests ordered today include:   No orders of the defined types were placed in this encounter.   Disposition:   FU with me or office APP in 6 months.     Signed, [name], MD 12/30/2017 9:27 AM    [facility] [address] [place], [place], [place]  [address] Phone: [phone]; Fax: [phone]

## 2017-12-30 ENCOUNTER — Encounter: Payer: Self-pay | Admitting: Cardiovascular Disease

## 2017-12-30 ENCOUNTER — Ambulatory Visit: Payer: PPO | Admitting: Cardiovascular Disease

## 2017-12-30 VITALS — BP 114/60 | HR 75 | Ht 69.5 in | Wt 195.8 lb

## 2017-12-30 DIAGNOSIS — I6523 Occlusion and stenosis of bilateral carotid arteries: Secondary | ICD-10-CM | POA: Diagnosis not present

## 2017-12-30 DIAGNOSIS — E7800 Pure hypercholesterolemia, unspecified: Secondary | ICD-10-CM

## 2017-12-30 DIAGNOSIS — I5022 Chronic systolic (congestive) heart failure: Secondary | ICD-10-CM

## 2017-12-30 DIAGNOSIS — I10 Essential (primary) hypertension: Secondary | ICD-10-CM

## 2017-12-30 DIAGNOSIS — I2510 Atherosclerotic heart disease of native coronary artery without angina pectoris: Secondary | ICD-10-CM

## 2017-12-30 DIAGNOSIS — I4819 Other persistent atrial fibrillation: Secondary | ICD-10-CM

## 2017-12-30 DIAGNOSIS — I255 Ischemic cardiomyopathy: Secondary | ICD-10-CM

## 2017-12-30 MED ORDER — APIXABAN 5 MG PO TABS: 5.0000 mg | ORAL_TABLET | Freq: Two times a day (BID) | ORAL | 6 refills | Status: DC

## 2017-12-30 NOTE — Patient Instructions (Signed)

## 2018-01-04 LAB — CUP PACEART REMOTE DEVICE CHECK
Battery Remaining Longevity: 96 mo
Battery Remaining Percentage: 94 %
Battery Voltage: 3.19 V
Brady Statistic RV Percent Paced: 1 %
Date Time Interrogation Session: 20190923135916
HighPow Impedance: 61 Ohm
HighPow Impedance: 61 Ohm
Implantable Lead Implant Date: 20190319
Implantable Lead Location: 753860
Implantable Pulse Generator Implant Date: 20190319
Lead Channel Impedance Value: 410 Ohm
Lead Channel Pacing Threshold Amplitude: 0.5 V
Lead Channel Pacing Threshold Pulse Width: 0.5 ms
Lead Channel Sensing Intrinsic Amplitude: 9.4 mV
Lead Channel Setting Pacing Amplitude: 2.5 V
Lead Channel Setting Pacing Pulse Width: 0.5 ms
Lead Channel Setting Sensing Sensitivity: 0.5 mV
Pulse Gen Serial Number: 9811279

## 2018-03-17 ENCOUNTER — Ambulatory Visit (INDEPENDENT_AMBULATORY_CARE_PROVIDER_SITE_OTHER): Payer: PPO

## 2018-03-17 DIAGNOSIS — I255 Ischemic cardiomyopathy: Secondary | ICD-10-CM

## 2018-03-17 DIAGNOSIS — I5022 Chronic systolic (congestive) heart failure: Secondary | ICD-10-CM

## 2018-03-17 NOTE — Progress Notes (Signed)
Remote ICD transmission.   

## 2018-03-18 ENCOUNTER — Telehealth: Payer: Self-pay

## 2018-03-18 NOTE — Telephone Encounter (Signed)
P/A form was place in Dr Alyse Low box for signature.

## 2018-03-19 LAB — CUP PACEART REMOTE DEVICE CHECK
Battery Remaining Longevity: 94 mo
Battery Remaining Percentage: 92 %
Battery Voltage: 3.16 V
Brady Statistic RV Percent Paced: 1 %
Date Time Interrogation Session: 20191223070017
HighPow Impedance: 62 Ohm
HighPow Impedance: 62 Ohm
Implantable Lead Implant Date: 20190319
Implantable Lead Location: 753860
Implantable Pulse Generator Implant Date: 20190319
Lead Channel Impedance Value: 400 Ohm
Lead Channel Pacing Threshold Amplitude: 0.5 V
Lead Channel Pacing Threshold Pulse Width: 0.5 ms
Lead Channel Sensing Intrinsic Amplitude: 11.4 mV
Lead Channel Setting Pacing Amplitude: 2.5 V
Lead Channel Setting Pacing Pulse Width: 0.5 ms
Lead Channel Setting Sensing Sensitivity: 0.5 mV
Pulse Gen Serial Number: 9811279

## 2018-03-28 ENCOUNTER — Other Ambulatory Visit: Payer: Self-pay | Admitting: *Deleted

## 2018-03-28 DIAGNOSIS — I6523 Occlusion and stenosis of bilateral carotid arteries: Secondary | ICD-10-CM

## 2018-03-31 NOTE — Telephone Encounter (Signed)
**Note De-Identified  Obfuscation** Dr Angelena Form has signed the PA form and I have faxed it back to [facility].

## 2018-04-02 ENCOUNTER — Other Ambulatory Visit: Payer: Self-pay | Admitting: Cardiovascular Disease

## 2018-04-02 NOTE — Telephone Encounter (Signed)
**Note De-Identified  Obfuscation** I have contacted [facility], the pts pharmacy, and made them aware that we received a letter from [facility] stating that a PA is not required at this time on the pts Entresto as there is an existing approval on file for this medication which is covered until 03/26/2019.   I was advised that they need a new prescription for [facility] and that they are faxing a refill request to this office now.

## 2018-04-15 DIAGNOSIS — I10 Essential (primary) hypertension: Secondary | ICD-10-CM | POA: Diagnosis not present

## 2018-04-15 DIAGNOSIS — Z0000 Encounter for general adult medical examination without abnormal findings: Secondary | ICD-10-CM | POA: Diagnosis not present

## 2018-04-15 DIAGNOSIS — Z7984 Long term (current) use of oral hypoglycemic drugs: Secondary | ICD-10-CM | POA: Diagnosis not present

## 2018-04-15 DIAGNOSIS — K219 Gastro-esophageal reflux disease without esophagitis: Secondary | ICD-10-CM | POA: Diagnosis not present

## 2018-04-15 DIAGNOSIS — E1122 Type 2 diabetes mellitus with diabetic chronic kidney disease: Secondary | ICD-10-CM | POA: Diagnosis not present

## 2018-04-15 DIAGNOSIS — E782 Mixed hyperlipidemia: Secondary | ICD-10-CM | POA: Diagnosis not present

## 2018-04-15 DIAGNOSIS — H6123 Impacted cerumen, bilateral: Secondary | ICD-10-CM | POA: Diagnosis not present

## 2018-04-15 DIAGNOSIS — I480 Paroxysmal atrial fibrillation: Secondary | ICD-10-CM | POA: Diagnosis not present

## 2018-04-15 DIAGNOSIS — N183 Chronic kidney disease, stage 3 (moderate): Secondary | ICD-10-CM | POA: Diagnosis not present

## 2018-04-15 DIAGNOSIS — I2510 Atherosclerotic heart disease of native coronary artery without angina pectoris: Secondary | ICD-10-CM | POA: Diagnosis not present

## 2018-04-25 DIAGNOSIS — H43812 Vitreous degeneration, left eye: Secondary | ICD-10-CM | POA: Diagnosis not present

## 2018-04-25 DIAGNOSIS — E113593 Type 2 diabetes mellitus with proliferative diabetic retinopathy without macular edema, bilateral: Secondary | ICD-10-CM | POA: Diagnosis not present

## 2018-04-25 DIAGNOSIS — H3582 Retinal ischemia: Secondary | ICD-10-CM | POA: Diagnosis not present

## 2018-05-15 ENCOUNTER — Encounter (HOSPITAL_COMMUNITY): Payer: PPO

## 2018-05-15 ENCOUNTER — Ambulatory Visit: Payer: PPO | Admitting: Family

## 2018-05-15 NOTE — Progress Notes (Signed)
HISTORY AND PHYSICAL     CC:  follow up. Requesting Provider:  Shirline Frees, MD  HPI: This is a 83 y.o. male here for follow up for carotid artery stenosis. Pt was last seen August 2019 and at that time, he was asymptomatic.  He reported going to the gym 3x/week and denied claudication.  He has hx of CABG in 2007.  He has a hx of afib and on AC but converted to NSR.   He has hx of ICD/PPM in March 2019.   Pt doing well.  He denies any amaurosis fugax.  He states he has a vail like covering of his right eye at times, but he has had a bleed in this eye and this is not new.  He is followed by ophthalmology.  He denies facial droop, speech difficulties, clumsiness or hemiparesis.    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  The pt is on a statin for cholesterol management.  The pt is diabetic.   The pt is on BB for hypertension.   Tobacco hx:  remote The pt is on a daily aspirin. Other AC:  Eliquis  Past Medical History:  Diagnosis Date  . ANEMIA, HX OF   . Bronchial pneumonia Jan. 2016  . CAD, ARTERY BYPASS GRAFT   . CAROTID ARTERY STENOSIS   . Cataract   . DIABETES MELLITUS, TYPE II   . Diabetic retinal damage of right eye ([place]) 2015  . Diverticulosis   . HYPERCHOLESTEROLEMIA   . HYPERTENSION   . Myocardial infarction [facility]) June 2007  . New onset atrial fibrillation ([place]) 06/24/2014  . PULMONARY HYPERTENSION   . RENAL INSUFFICIENCY, CHRONIC   . UNSPECIFIED THROMBOCYTOPENIA     Past Surgical History:  Procedure Laterality Date  . CARDIAC CATHETERIZATION  06/24/2014  . CATARACT EXTRACTION  2012   right eye  . COLONOSCOPY    . CORONARY ARTERY BYPASS GRAFT      quad bypass/2007  . EYE SURGERY    . ICD IMPLANT [name] 06/11/2017   Procedure: ICD IMPLANT;  Surgeon: Constance Haw, MD;  Location: [place] [facility];  Service: Cardiovascular;  Laterality: [name];  . LEFT HEART CATHETERIZATION WITH CORONARY/GRAFT ANGIOGRAM [name]  06/24/2014   Procedure: LEFT HEART CATHETERIZATION WITH Beatrix Fetters;  Surgeon: Burnell Blanks, MD;  Location: [facility] CATH LAB;  Service: Cardiovascular;  Laterality: [name];  . POLYPECTOMY    . TONSILLECTOMY      No Known Allergies  Current Outpatient Medications  Medication Sig Dispense Refill  . apixaban (ELIQUIS) 5 MG TABS tablet Take 1 tablet (5 mg total) by mouth 2 (two) times daily. 60 tablet 6  . aspirin 81 MG tablet Take 81 mg by mouth daily.      . calcium-vitamin D (OSCAL WITH D) 500-200 MG-UNIT per tablet Take 1 tablet by mouth daily.     . Cholecalciferol (VITAMIN D3) 2000 UNITS TABS Take 2,000 Units by mouth daily.     . Coenzyme Q10 (CO Q 10 PO) Take 300 mg by mouth daily.    [name] ENTRESTO 24-26 MG TAKE 1 TABLET BY MOUTH TWICE DAILY 180 tablet 2  . fish oil-omega-3 fatty acids 1000 MG capsule Take 1 g by mouth daily.      . furosemide (LASIX) 20 MG tablet Take 20 mg by mouth daily.  0  . glipiZIDE (GLUCOTROL) 10 MG tablet Take 10 mg by mouth 2 (two) times daily before  a meal.    . metFORMIN (GLUCOPHAGE) 500 MG tablet Take 2 tablets (1,000 mg total) by mouth 2 (two) times daily with a meal.    . metoprolol succinate (TOPROL-XL) 25 MG 24 hr tablet Take 1 tablet (25 mg total) by mouth daily. 90 tablet 3  . Multiple Vitamin (MULTI-DAY VITAMINS PO) Take 1 tablet by mouth daily.     [name] omeprazole (PRILOSEC) 20 MG capsule Take 20 mg by mouth 2 (two) times daily before a meal.     . simvastatin (ZOCOR) 20 MG tablet Take 20 mg by mouth daily at 6 PM.     . Turmeric 450 MG CAPS Take 450 mg by mouth daily.     . vitamin C (ASCORBIC ACID) 500 MG tablet Take 500 mg by mouth daily.     . vitamin E 1000 UNIT capsule Take 1,000 Units by mouth every other day.     No current facility-administered medications for this visit.     Family History  Problem Relation Age of Onset  . Heart disease Father 56       Heart disease before age 19  . Heart attack Father   . Hypertension  Father   . Hyperlipidemia Father   . Varicose Veins Father   . Stroke Father        ? not sure  . Colon cancer Maternal Aunt   . Colon cancer Maternal Uncle   . Diverticulitis Unknown   . Heart attack Unknown     Social History   Socioeconomic History  . Marital status: Married    Spouse name: Not on file  . Number of children: 2  . Years of education: Not on file  . Highest education level: Not on file  Occupational History  . Occupation: Horticulturist, commercial: RETIRED  Social Needs  . Financial resource strain: Not on file  . Food insecurity:    Worry: Not on file    Inability: Not on file  . Transportation needs:    Medical: Not on file    Non-medical: Not on file  Tobacco Use  . Smoking status: Former Smoker    Types: Cigarettes    Last attempt to quit: 03/26/1970    Years since quitting: 48.1  . Smokeless tobacco: Never Used  Substance and Sexual Activity  . Alcohol use: Yes    Alcohol/week: 1.0 standard drinks    Types: 1 Glasses of wine per week  . Drug use: No  . Sexual activity: Not on file  Lifestyle  . Physical activity:    Days per week: Not on file    Minutes per session: Not on file  . Stress: Not on file  Relationships  . Social connections:    Talks on phone: Not on file    Gets together: Not on file    Attends religious service: Not on file    Active member of club or organization: Not on file    Attends meetings of clubs or organizations: Not on file    Relationship status: Not on file  . Intimate partner violence:    Fear of current or ex partner: Not on file    Emotionally abused: Not on file    Physically abused: Not on file    Forced sexual activity: Not on file  Other Topics Concern  . Not on file  Social History Narrative  . Not on file     REVIEW OF SYSTEMS:   [X]  denotes positive  finding, [ ]  denotes negative finding Cardiac  Comments:  Chest pain or chest pressure:    Shortness of breath upon exertion:    Short of  breath when lying flat:    Irregular heart rhythm:        Vascular    Pain in calf, thigh, or hip brought on by ambulation:    Pain in feet at night that wakes you up from your sleep:     Blood clot in your veins:    Leg swelling:         Pulmonary    Oxygen at home:    Productive cough:     Wheezing:         Neurologic    Sudden weakness in arms or legs:     Sudden numbness in arms or legs:     Sudden onset of difficulty speaking or slurred speech:    Temporary loss of vision in one eye:     Problems with dizziness:         Gastrointestinal    Blood in stool:     Vomited blood:         Genitourinary    Burning when urinating:     Blood in urine:        Psychiatric    Major depression:         Hematologic    Bleeding problems:    Problems with blood clotting too easily:        Skin    Rashes or ulcers:        Constitutional    Fever or chills:      PHYSICAL EXAMINATION:  Today's Vitals   05/16/18 1151  BP: 127/76  Pulse: 73  Resp: 12  Temp: (!) 97.2 F (36.2 C)  TempSrc: Oral  SpO2: 100%  Weight: 194 lb 7.1 oz (88.2 kg)  Height: 5\' 11"  (1.803 m)   Body mass index is 27.12 kg/m.   General:  WDWN in NAD; vital signs documented above Gait: Not observed HENT: WNL, normocephalic Pulmonary: normal non-labored breathing , without Rales, rhonchi,  wheezing Cardiac: regular HR, without  Murmurs, rubs or gallops; without carotid bruits Abdomen: soft, NT, no masses Skin: without rashes Vascular Exam/Pulses:  Right Left  Radial 2+ (normal) 2+ (normal)  Femoral 2+ (normal) 2+ (normal)  Popliteal Unable to palpate  Unable to palpate   DP Unable to palpate  Unable to palpate   PT Unable to palpate  Unable to palpate    Extremities: with ischemic changes, without Gangrene , without cellulitis; without open wounds; venous changes BLE Musculoskeletal: no muscle wasting or atrophy  Neurologic: A&O X 3 Psychiatric:  The pt has Normal  affect.   Non-Invasive Vascular Imaging:   Carotid Duplex on 05/16/2018: Right:  60-79% stenosis Left:  1-39% stenosis Vertebrals:  Bilateral vertebral arteries demonstrate antegrade flow. Subclavians: Normal flow hemodynamics were seen in bilateral subclavian arteries  Previous Carotid duplex on 11/07/17: 60-79% right proximal internal carotid artery stenosis. 1-39%left proximal internal carotid artery stenosis.  Bilateral vertebral artery flow is antegrade.  Bilateral subclavian artery waveforms are normal. Increased stenosis on the right ICA, decreased in the left ICA, compared to the exam on 10-30-16.    ASSESSMENT/PLAN:: 83 y.o. male here for follow up carotid artery stenosis.   -pt doing well and he remains asymptomatic.   -discussed s/s of stroke with pt and they understand should they develop any of these sx, they will go to the nearest  ER. -he will call sooner should he have any issues. -continue statin/asa   Leontine Locket, PA-C [facility] [phone]  Clinic MD:  Donzetta Matters

## 2018-05-16 ENCOUNTER — Ambulatory Visit (HOSPITAL_COMMUNITY)
Admission: RE | Admit: 2018-05-16 | Discharge: 2018-05-16 | Disposition: A | Discharge status: Home / Self Care | Payer: PPO | Source: Ambulatory Visit | Attending: Family | Admitting: Family

## 2018-05-16 ENCOUNTER — Encounter: Payer: Self-pay | Admitting: Physician Assistant

## 2018-05-16 ENCOUNTER — Ambulatory Visit (INDEPENDENT_AMBULATORY_CARE_PROVIDER_SITE_OTHER): Payer: PPO | Admitting: Physician Assistant

## 2018-05-16 ENCOUNTER — Other Ambulatory Visit: Payer: Self-pay

## 2018-05-16 VITALS — BP 127/76 | HR 73 | Temp 97.2°F | Resp 12 | Ht 71.0 in | Wt 194.4 lb

## 2018-05-16 DIAGNOSIS — I6523 Occlusion and stenosis of bilateral carotid arteries: Secondary | ICD-10-CM

## 2018-05-30 ENCOUNTER — Telehealth: Payer: Self-pay | Admitting: Pulmonary Disease

## 2018-06-02 ENCOUNTER — Other Ambulatory Visit: Payer: Self-pay

## 2018-06-02 ENCOUNTER — Ambulatory Visit (INDEPENDENT_AMBULATORY_CARE_PROVIDER_SITE_OTHER)
Admission: RE | Admit: 2018-06-02 | Discharge: 2018-06-02 | Disposition: A | Discharge status: Home / Self Care | Payer: PPO | Source: Ambulatory Visit | Attending: Primary Care | Admitting: Primary Care

## 2018-06-02 ENCOUNTER — Ambulatory Visit (INDEPENDENT_AMBULATORY_CARE_PROVIDER_SITE_OTHER): Payer: PPO | Admitting: Primary Care

## 2018-06-02 ENCOUNTER — Encounter: Payer: Self-pay | Admitting: Primary Care

## 2018-06-02 VITALS — BP 122/74 | HR 85 | Ht 71.0 in | Wt 194.6 lb

## 2018-06-02 DIAGNOSIS — R0602 Shortness of breath: Secondary | ICD-10-CM

## 2018-06-02 DIAGNOSIS — R0600 Dyspnea, unspecified: Secondary | ICD-10-CM | POA: Insufficient documentation

## 2018-06-02 LAB — BRAIN NATRIURETIC PEPTIDE: Pro B Natriuretic peptide (BNP): 981 pg/mL — ABNORMAL HIGH (ref 0.0–100.0)

## 2018-06-02 LAB — BASIC METABOLIC PANEL
BUN: 34 mg/dL — ABNORMAL HIGH (ref 6–23)
CO2: 27 mEq/L (ref 19–32)
Calcium: 9.5 mg/dL (ref 8.4–10.5)
Chloride: 100 mEq/L (ref 96–112)
Creatinine, Ser: 1.54 mg/dL — ABNORMAL HIGH (ref 0.40–1.50)
GFR: 43.61 mL/min — ABNORMAL LOW (ref >60.00)
Glucose, Bld: 162 mg/dL — ABNORMAL HIGH (ref 70–99)
Potassium: 4.3 mEq/L (ref 3.5–5.1)
Sodium: 137 mEq/L (ref 135–145)

## 2018-06-02 NOTE — Progress Notes (Signed)
@Patient  ID: [name]., male    DOB: Nov 05, 1935, 83 y.o.   MRN: [id number]  Chief Complaint  Patient presents with  . Acute Visit    SOB, cough, fluid build up    Referring provider: Shirline Frees, MD  HPI: 83 year old male, former smoker quiot in 31. PMH significant for HTN, pulm hypertension, afib, ICD implantation, CABG x4 '07 (grafts 2010, 2016), CAD, type 2 diabetes, chronic renal insufficiency stage III, anemia, dyslipidemia, pleural effusion. Patient had IR guided thoracentesis on 08/13/17 that showed transudative effusion, culture and cytology were negative. Breathing improved.   06/02/2018 Patient presents today for 6 month follow up with acute complaints of gradually increasing shortness of breath x 2-3 months. Experiences shortness of breath mainly on exertion and stairs.. Associated dry, non-productive cough. Leg swelling at baseline. Takes lasix 20mg  daily. Does not use rest room excessively.    No Known Allergies  Immunization History  Administered Date(s) Administered  . Pneumococcal Conjugate-13 10/03/2016  . Pneumococcal Polysaccharide-23 08/23/2004    Past Medical History:  Diagnosis Date  . ANEMIA, HX OF   . Bronchial pneumonia Jan. 2016  . CAD, ARTERY BYPASS GRAFT   . CAROTID ARTERY STENOSIS   . Cataract   . DIABETES MELLITUS, TYPE II   . Diabetic retinal damage of right eye ([place]) 2015  . Diverticulosis   . HYPERCHOLESTEROLEMIA   . HYPERTENSION   . Myocardial infarction [facility]) June 2007  . New onset atrial fibrillation ([place]) 06/24/2014  . PULMONARY HYPERTENSION   . RENAL INSUFFICIENCY, CHRONIC   . UNSPECIFIED THROMBOCYTOPENIA     Tobacco History: Social History   Tobacco Use  Smoking Status Former Smoker  . Types: Cigarettes  . Last attempt to quit: 03/26/1970  . Years since quitting: 48.2  Smokeless Tobacco Never Used   Counseling given: Not Answered   Outpatient Medications Prior to Visit  Medication Sig Dispense Refill  .  apixaban (ELIQUIS) 5 MG TABS tablet Take 1 tablet (5 mg total) by mouth 2 (two) times daily. 60 tablet 6  . aspirin 81 MG tablet Take 81 mg by mouth daily.      . calcium-vitamin D (OSCAL WITH D) 500-200 MG-UNIT per tablet Take 1 tablet by mouth daily.     . Cholecalciferol (VITAMIN D3) 2000 UNITS TABS Take 2,000 Units by mouth daily.     . Coenzyme Q10 (CO Q 10 PO) Take 300 mg by mouth daily.    [name] ENTRESTO 24-26 MG TAKE 1 TABLET BY MOUTH TWICE DAILY 180 tablet 2  . fish oil-omega-3 fatty acids 1000 MG capsule Take 1 g by mouth daily.      . furosemide (LASIX) 20 MG tablet Take 20 mg by mouth daily.  0  . glipiZIDE (GLUCOTROL) 10 MG tablet Take 10 mg by mouth 2 (two) times daily before a meal.    . metFORMIN (GLUCOPHAGE) 500 MG tablet Take 2 tablets (1,000 mg total) by mouth 2 (two) times daily with a meal.    . metoprolol succinate (TOPROL-XL) 25 MG 24 hr tablet Take 1 tablet (25 mg total) by mouth daily. 90 tablet 3  . Multiple Vitamin (MULTI-DAY VITAMINS PO) Take 1 tablet by mouth daily.     [name] omeprazole (PRILOSEC) 20 MG capsule Take 20 mg by mouth 2 (two) times daily before a meal.     . simvastatin (ZOCOR) 20 MG tablet Take 20 mg by mouth daily at 6 PM.     . Turmeric 450  MG CAPS Take 450 mg by mouth daily.     . vitamin C (ASCORBIC ACID) 500 MG tablet Take 500 mg by mouth daily.     . vitamin E 1000 UNIT capsule Take 1,000 Units by mouth every other day.     No facility-administered medications prior to visit.     Review of Systems  Review of Systems  Constitutional: Negative.   HENT: Negative.   Respiratory: Positive for cough and shortness of breath. Negative for wheezing.   Cardiovascular: Positive for leg swelling.   Physical Exam  BP 122/74 (BP Location: Left Arm, Cuff Size: Normal)   Pulse 85   Ht 5\' 11"  (1.803 m)   Wt 194 lb 9.6 oz (88.3 kg)   SpO2 100%   BMI 27.14 kg/m  Physical Exam Constitutional:      General: He is not in acute distress.    Appearance:  Normal appearance. He is not ill-appearing.  HENT:     Head: Normocephalic and atraumatic.  Cardiovascular:     Rate and Rhythm: Normal rate.     Comments: Trace BLE edema Pulmonary:     Effort: Pulmonary effort is normal.     Breath sounds: No wheezing or rhonchi.     Comments: Mostly clear, diminished right base  Musculoskeletal: Normal range of motion.  Neurological:     General: No focal deficit present.     Mental Status: He is alert and oriented to person, place, and time. Mental status is at baseline.  Psychiatric:        Mood and Affect: Mood normal.        Behavior: Behavior normal.        Thought Content: Thought content normal.        Judgment: Judgment normal.      Lab Results:  CBC    Component Value Date/Time   WBC 8.5 05/31/2017 0922   WBC 6.8 06/25/2014 0329   RBC 4.27 05/31/2017 0922   RBC 4.18 (L) 06/25/2014 0329   HGB 12.9 (L) 05/31/2017 0922   HCT 38.4 05/31/2017 0922   PLT 250 05/31/2017 0922   MCV 90 05/31/2017 0922   MCH 30.2 05/31/2017 0922   MCH 29.9 06/25/2014 0329   MCHC 33.6 05/31/2017 0922   MCHC 33.2 06/25/2014 0329   RDW 15.6 (H) 05/31/2017 0922   LYMPHSABS 1.4 05/31/2017 0922   MONOABS 0.6 06/21/2014 1053   EOSABS 0.2 05/31/2017 0922   BASOSABS 0.0 05/31/2017 0922    BMET    Component Value Date/Time   NA 142 05/31/2017 0922   K 4.7 05/31/2017 0922   CL 103 05/31/2017 0922   CO2 24 05/31/2017 0922   GLUCOSE 89 05/31/2017 0922   GLUCOSE 101 (H) 06/25/2014 0329   BUN 28 (H) 05/31/2017 0922   CREATININE 1.35 (H) 05/31/2017 0922   CALCIUM 9.5 05/31/2017 0922   GFRNONAA 50 (L) 05/31/2017 0922   GFRAA 57 (L) 05/31/2017 0922    BNP No results found for: BNP  ProBNP No results found for: PROBNP  Imaging: Dg Chest 2 View  Result Date: 06/02/2018 CLINICAL DATA:  Increasing shortness of breath for 2-3 months EXAM: CHEST - 2 VIEW COMPARISON:  12/13/2017 FINDINGS: Cardiac shadow is stable. Defibrillator is again noted. The left  lung is clear. Right-sided pleural effusion is noted slightly increased when compared with prior exam. No new focal infiltrate is seen. Postsurgical changes are again noted. No acute bony abnormality is seen. IMPRESSION: Slight increase in right-sided pleural  effusion when compared with the prior study. Electronically Signed   By: Inez Catalina M.D.   On: 06/02/2018 11:12   Vas US Carotid  Result Date: 05/16/2018 Carotid Arterial Duplex Study Indications:       Carotid artery disease. Comparison Study:  11/07/17: Right 60-79% Left 1-39% Performing Technologist: Ralene Cork RVT  Examination Guidelines: A complete evaluation includes B-mode imaging, spectral Doppler, color Doppler, and power Doppler as needed of all accessible portions of each vessel. Bilateral testing is considered an integral part of a complete examination. Limited examinations for reoccurring indications may be performed as noted.  Right Carotid Findings: +----------+--------+--------+--------+------------+----------------+           PSV cm/sEDV cm/sStenosisDescribe    Comments         +----------+--------+--------+--------+------------+----------------+ CCA Prox  82      17                                           +----------+--------+--------+--------+------------+----------------+ CCA Mid   89      20                                           +----------+--------+--------+--------+------------+----------------+ CCA Distal60      15                                           +----------+--------+--------+--------+------------+----------------+ ICA Prox  217     66      60-79%  heterogenouslow end of range +----------+--------+--------+--------+------------+----------------+ ICA Mid   83      25                                           +----------+--------+--------+--------+------------+----------------+ ICA Distal81      32                                            +----------+--------+--------+--------+------------+----------------+ ECA       89      10                                           +----------+--------+--------+--------+------------+----------------+ +----------+--------+-------+----------------+-------------------+           PSV cm/sEDV cmsDescribe        Arm Pressure (mmHG) +----------+--------+-------+----------------+-------------------+ Subclavian110            Multiphasic, WNL                    +----------+--------+-------+----------------+-------------------+ +---------+--------+--+--------+---------+ VertebralPSV cm/s42EDV cm/sAntegrade +---------+--------+--+--------+---------+  Left Carotid Findings: +----------+--------+--------+--------+----------------------+--------+           PSV cm/sEDV cm/sStenosisDescribe              Comments +----------+--------+--------+--------+----------------------+--------+ CCA Prox  66      18                                             +----------+--------+--------+--------+----------------------+--------+  CCA Mid   75      19              focal and heterogenous         +----------+--------+--------+--------+----------------------+--------+ CCA Distal63      21                                             +----------+--------+--------+--------+----------------------+--------+ ICA Prox  85      30      1-39%   calcific                       +----------+--------+--------+--------+----------------------+--------+ ICA Mid   73      22                                             +----------+--------+--------+--------+----------------------+--------+ ICA Distal80      24                                             +----------+--------+--------+--------+----------------------+--------+ ECA       76      8                                              +----------+--------+--------+--------+----------------------+--------+  +----------+--------+--------+----------------+-------------------+ SubclavianPSV cm/sEDV cm/sDescribe        Arm Pressure (mmHG) +----------+--------+--------+----------------+-------------------+           117             Multiphasic, WNL                    +----------+--------+--------+----------------+-------------------+ +---------+--------+--+--------+-+---------+ VertebralPSV cm/s57EDV cm/s9Antegrade +---------+--------+--+--------+-+---------+  Summary: Right Carotid: Velocities in the right ICA are consistent with a 60-79%                stenosis. Velocity in the low end of the range. Left Carotid: Velocities in the left ICA are consistent with a 1-39% stenosis. Vertebrals:  Bilateral vertebral arteries demonstrate antegrade flow. Subclavians: Normal flow hemodynamics were seen in bilateral subclavian              arteries. *See table(s) above for measurements and observations.  Electronically signed by Servando Snare MD on 05/16/2018 at 12:16:29 PM.    Final      Assessment & Plan:   Dyspnea - Dyspnea x 2-3 months. Hx pleural effusion and CHF with EF 25%  - CXR appears to show increase in previously stable right pleural effusion (awaiting official read) - Obtaining labs ( BNP and Bmet) - Continue lasix 20mg  daily, will likely increase diuretic once testing resulted  - May need repeat imaging after diuresis and/or thoracentesis if no improvement (will need ok from cardiology to hold Eliquis 2-3 days prior)   Martyn Ehrich, NP 06/02/2018

## 2018-06-02 NOTE — Patient Instructions (Addendum)
Orders: CXR - check for pleural effusion Labs - monitor for fluid overload (BNP/BMET)  Recommendations: Continue lasix 20mg  daily, will notify you after lab work if we needed to increase dose  CXR did evidence of right pleural effusion, we will have to get ok from your cardiologist to stop Eliquis 2-3 days prior to thoracentesis   If breathing acutely worsens please go to ED    Pleural Effusion Pleural effusion is an abnormal buildup of fluid in the layers of tissue between the lungs and the inside of the chest (pleural space) The two layers of tissue that line the lungs and the inside of the chest are called pleura. Usually, there is no air in the space between the pleura, only a thin layer of fluid. Some conditions can cause a large amount of fluid to build up, which can cause the lung to collapse if untreated. A pleural effusion is usually caused by another disease that requires treatment. What are the causes? Pleural effusion can be caused by:  Heart failure.  Certain infections, such as pneumonia or tuberculosis.  Cancer.  A blood clot in the lung (pulmonary embolism).  Complications from surgery, such as from open heart surgery.  Liver disease (cirrhosis).  Kidney disease. What are the signs or symptoms? In some cases, pleural effusion may cause no symptoms. If symptoms are present, they may include:  Shortness of breath, especially when lying down.  Chest pain. This may get worse when taking a deep breath.  Fever.  Dry, long-lasting (chronic) cough.  Hiccups.  Rapid breathing. An underlying condition that is causing the pleural effusion (such as heart failure, pneumonia, blood clots, tuberculosis, or cancer) may also cause other symptoms. How is this diagnosed? This condition may be diagnosed based on:  Your symptoms and medical history.  A physical exam.  A chest X-ray.  A procedure to use a needle to remove fluid from the pleural space (thoracentesis).  This fluid is tested.  Other imaging studies of the chest, such as ultrasound or CT scan. How is this treated? Depending on the cause of your condition, treatment may include:  Treating the underlying condition that is causing the effusion. When that condition improves, the effusion will also improve. Examples of treatment for underlying conditions include: ? Antibiotic medicines to treat an infection. ? Diuretics or other heart medicines to treat heart failure.  Thoracentesis.  Placing a thin flexible tube under your skin and into your chest to continuously drain the effusion (indwelling pleural catheter).  Surgery to remove the outer layer of tissue from the pleural space (decortication).  A procedure to put medicine into the chest cavity to seal the pleural space and prevent fluid buildup (pleurodesis).  Chemotherapy and radiation therapy, if you have cancerous (malignant) pleural effusion. These treatments are typically used to treat cancer. They kill certain cells in the body. Follow these instructions at home:  Take over-the-counter and prescription medicines only as told by your health care provider.  Ask your health care provider what activities are safe for you.  Keep track of how long you are able to do mild exercise (such as walking) before you get short of breath. Write down this information to share with your health care provider. Your ability to exercise should improve over time.  Do not use any products that contain nicotine or tobacco, such as cigarettes and e-cigarettes. If you need help quitting, ask your health care provider.  Keep all follow-up visits as told by your health care provider.  This is important. Contact a health care provider if:  The amount of time that you are able to do mild exercise: ? Decreases. ? Does not improve with time.  You have a fever. Get help right away if:  You are short of breath.  You develop chest pain.  You develop a new  cough. Summary  Pleural effusion is an abnormal buildup of fluid in the layers of tissue between the lungs and the inside of the chest.  Pleural effusion can have many causes, including heart failure, pulmonary embolism, infections, or cancer.  Symptoms of pleural effusion can include shortness of breath, chest pain, fever, long-lasting (chronic) cough, hiccups, or rapid breathing.  Diagnosis often involves making images of the chest (such as with ultrasound or X-ray) and removing fluid (thoracentesis) to send for testing.  Treatment for pleural effusion depends on what underlying condition is causing it. This information is not intended to replace advice given to you by your health care provider. Make sure you discuss any questions you have with your health care provider. Document Released: 03/12/2005 Document Revised: 11/15/2016 Document Reviewed: 11/15/2016 Elsevier Interactive Patient Education  2019 [employer].

## 2018-06-02 NOTE — Assessment & Plan Note (Addendum)
-   Dyspnea x 2-3 months. Hx pleural effusion and CHF with EF 25%  - CXR appears to show increase in previously stable right pleural effusion (awaiting official read) - Obtaining labs ( BNP and Bmet) - Continue lasix 20mg  daily, will likely increase diuretic once testing resulted  - May need repeat imaging after diuresis and/or thoracentesis if no improvement (will need ok from cardiology to hold Eliquis 2-3 days prior)

## 2018-06-05 NOTE — Telephone Encounter (Signed)
Due to it seeming like encounter was open in error, closing encounter.

## 2018-06-06 DIAGNOSIS — H9209 Otalgia, unspecified ear: Secondary | ICD-10-CM | POA: Diagnosis not present

## 2018-06-06 DIAGNOSIS — H903 Sensorineural hearing loss, bilateral: Secondary | ICD-10-CM | POA: Diagnosis not present

## 2018-06-16 ENCOUNTER — Other Ambulatory Visit: Payer: Self-pay

## 2018-06-16 ENCOUNTER — Telehealth: Payer: Self-pay | Admitting: Primary Care

## 2018-06-16 ENCOUNTER — Ambulatory Visit (INDEPENDENT_AMBULATORY_CARE_PROVIDER_SITE_OTHER): Payer: PPO | Admitting: *Deleted

## 2018-06-16 DIAGNOSIS — I255 Ischemic cardiomyopathy: Secondary | ICD-10-CM | POA: Diagnosis not present

## 2018-06-16 DIAGNOSIS — I5022 Chronic systolic (congestive) heart failure: Secondary | ICD-10-CM

## 2018-06-16 NOTE — Telephone Encounter (Signed)
Increased lasix dose was for two weeks only. Ok to resume previous 20mg  daily. Needs repeat CXR this week (already ordered). Please wear mask to keep him safe. Will call with results later this week.

## 2018-06-16 NOTE — Telephone Encounter (Signed)
Called the patient and advised him based on response received to go back to regular daily dosage of Lasix and that he can come in this week for the xray.  Patient voiced understanding and said he will come in this week on 06/18/18 for the xray. Advised him once resulted, he will be called.  Nothing further needed at this time.

## 2018-06-16 NOTE — Telephone Encounter (Signed)
Called the patient and he stated he was still taking the increased dosage of Lasix based on his OV with Derl Barrow on 06/02/18 and xray results.   Patient wants to know if he should continue taking the increased dosage of Lasix, if so he is going to run out of the medication and will need a prescription.  Patient also wanted to know if he was supposed to come in for the xray and if so when.  Message routed to Derl Barrow, NP.  Beth, please take a look at the above and advise.

## 2018-06-17 ENCOUNTER — Other Ambulatory Visit: Payer: Self-pay

## 2018-06-17 ENCOUNTER — Ambulatory Visit (INDEPENDENT_AMBULATORY_CARE_PROVIDER_SITE_OTHER): Payer: PPO

## 2018-06-17 DIAGNOSIS — R0602 Shortness of breath: Secondary | ICD-10-CM | POA: Diagnosis not present

## 2018-06-17 LAB — CUP PACEART REMOTE DEVICE CHECK
Battery Remaining Longevity: 91 mo
Battery Remaining Percentage: 90 %
Battery Voltage: 3.11 V
Brady Statistic RV Percent Paced: 1 %
Date Time Interrogation Session: 20200323060016
HighPow Impedance: 63 Ohm
HighPow Impedance: 63 Ohm
Implantable Lead Implant Date: 20190319
Implantable Lead Location: 753860
Implantable Pulse Generator Implant Date: 20190319
Lead Channel Impedance Value: 380 Ohm
Lead Channel Pacing Threshold Amplitude: 0.5 V
Lead Channel Pacing Threshold Pulse Width: 0.5 ms
Lead Channel Sensing Intrinsic Amplitude: 12 mV
Lead Channel Setting Pacing Amplitude: 2.5 V
Lead Channel Setting Pacing Pulse Width: 0.5 ms
Lead Channel Setting Sensing Sensitivity: 0.5 mV
Pulse Gen Serial Number: 9811279

## 2018-06-25 NOTE — Progress Notes (Signed)
Remote ICD transmission.   

## 2018-07-01 DIAGNOSIS — I10 Essential (primary) hypertension: Secondary | ICD-10-CM | POA: Diagnosis not present

## 2018-07-01 DIAGNOSIS — I6523 Occlusion and stenosis of bilateral carotid arteries: Secondary | ICD-10-CM | POA: Diagnosis not present

## 2018-07-01 DIAGNOSIS — E1122 Type 2 diabetes mellitus with diabetic chronic kidney disease: Secondary | ICD-10-CM | POA: Diagnosis not present

## 2018-07-01 DIAGNOSIS — I129 Hypertensive chronic kidney disease with stage 1 through stage 4 chronic kidney disease, or unspecified chronic kidney disease: Secondary | ICD-10-CM | POA: Diagnosis not present

## 2018-07-01 DIAGNOSIS — I2510 Atherosclerotic heart disease of native coronary artery without angina pectoris: Secondary | ICD-10-CM | POA: Diagnosis not present

## 2018-07-01 DIAGNOSIS — I429 Cardiomyopathy, unspecified: Secondary | ICD-10-CM | POA: Diagnosis not present

## 2018-07-01 DIAGNOSIS — I480 Paroxysmal atrial fibrillation: Secondary | ICD-10-CM | POA: Diagnosis not present

## 2018-07-01 DIAGNOSIS — E782 Mixed hyperlipidemia: Secondary | ICD-10-CM | POA: Diagnosis not present

## 2018-07-01 DIAGNOSIS — K219 Gastro-esophageal reflux disease without esophagitis: Secondary | ICD-10-CM | POA: Diagnosis not present

## 2018-07-01 DIAGNOSIS — Z7984 Long term (current) use of oral hypoglycemic drugs: Secondary | ICD-10-CM | POA: Diagnosis not present

## 2018-07-11 ENCOUNTER — Telehealth: Payer: Self-pay | Admitting: Cardiovascular Disease

## 2018-07-11 NOTE — Telephone Encounter (Signed)
PT called to have office be on the lookout for a fax from [facility] about the pt's Eliquis approval

## 2018-07-25 ENCOUNTER — Ambulatory Visit: Payer: PPO | Admitting: Cardiovascular Disease

## 2018-07-25 DIAGNOSIS — H903 Sensorineural hearing loss, bilateral: Secondary | ICD-10-CM | POA: Diagnosis not present

## 2018-07-25 DIAGNOSIS — H9209 Otalgia, unspecified ear: Secondary | ICD-10-CM | POA: Diagnosis not present

## 2018-07-28 ENCOUNTER — Telehealth: Payer: Self-pay | Admitting: *Deleted

## 2018-07-28 NOTE — Telephone Encounter (Signed)
I spoke with [name]. He has smart phone and gives consent to video visit.   Virtual Visit Pre-Appointment Phone Call  "(Name), I am calling you today to discuss your upcoming appointment. We are currently trying to limit exposure to the virus that causes COVID-19 by seeing patients at home rather than in the office."  "What is the BEST phone number to call the day of the visit?" - [phone]   1. "Do you have or have access to (through a family member/friend) a smartphone with video capability that we can use for your visit?" a. If yes - list this number in appt notes as "cell" (if different from BEST phone #) and list the appointment type as a VIDEO visit in appointment notes b. If no - list the appointment type as a PHONE visit in appointment notes  2. Confirm consent - "In the setting of the current Covid19 crisis, you are scheduled for a (phone or video) visit with your provider on (date) at (time).  Just as we do with many in-office visits, in order for you to participate in this visit, we must obtain consent.  If you'd like, I can send this to your mychart (if signed up) or email for you to review.  Otherwise, I can obtain your verbal consent now.  All virtual visits are billed to your insurance company just like a normal visit would be.  By agreeing to a virtual visit, we'd like you to understand that the technology does not allow for your provider to perform an examination, and thus may limit your provider's ability to fully assess your condition. If your provider identifies any concerns that need to be evaluated in person, we will make arrangements to do so.  Finally, though the technology is pretty good, we cannot assure that it will always work on either your or our end, and in the setting of a video visit, we may have to convert it to a phone-only visit.  In either situation, we cannot ensure that we have a secure connection.  Are you willing to proceed?" STAFF: Did the patient verbally  acknowledge consent to telehealth visit? Document YES/NO here: yes  3. Advise patient to be prepared - "Two hours prior to your appointment, go ahead and check your blood pressure, pulse, oxygen saturation, and your weight (if you have the equipment to check those) and write them all down. When your visit starts, your provider will ask you for this information. If you have an Apple Watch or Kardia device, please plan to have heart rate information ready on the day of your appointment. Please have a pen and paper handy nearby the day of the visit as well."  4. Give patient instructions for MyChart download to smartphone OR Doximity/Doxy.me as below if video visit (depending on what platform provider is using)  5. Inform patient they will receive a phone call 15 minutes prior to their appointment time (may be from unknown caller ID) so they should be prepared to answer    Yoe. has been deemed a candidate for a follow-up tele-health visit to limit community exposure during the Covid-19 pandemic. I spoke with the patient via phone to ensure availability of phone/video source, confirm preferred email & phone number, and discuss instructions and expectations.  I reminded [name] to be prepared with any vital sign and/or heart rhythm information that could potentially be obtained via home monitoring, at the time of his visit.  I reminded [name] to expect a phone call prior to his visit.  [name], RN 07/28/2018 12:02 PM   INSTRUCTIONS FOR DOWNLOADING THE MYCHART APP TO SMARTPHONE  - The patient must first make sure to have activated MyChart and know their login information - If Apple, go to [employer] and type in MyChart in the search bar and download the app. If Android, ask patient to go to [employer] and type in George in the search bar and download the app. The app is free but as with any other app downloads, their phone may  require them to verify saved payment information or Apple/Android password.  - The patient will need to then log into the app with their MyChart username and password, and select [facility] as their healthcare provider to link the account. When it is time for your visit, go to the MyChart app, find appointments, and click Begin Video Visit. Be sure to Select Allow for your device to access the Microphone and Camera for your visit. You will then be connected, and your provider will be with you shortly.  **If they have any issues connecting, or need assistance please contact MyChart service desk [phone])**  **If using a computer, in order to ensure the best quality for their visit they will need to use either of the following Internet Browsers: [employer], or Google Chrome**  IF USING DOXIMITY or DOXY.ME - The patient will receive a link just prior to their visit by text.     FULL LENGTH CONSENT FOR TELE-HEALTH VISIT   I hereby voluntarily request, consent and authorize [facility] and its employed or contracted physicians, physician assistants, nurse practitioners or other licensed health care professionals (the Practitioner), to provide me with telemedicine health care services (the "Services") as deemed necessary by the treating Practitioner. I acknowledge and consent to receive the Services by the Practitioner via telemedicine. I understand that the telemedicine visit will involve communicating with the Practitioner through live audiovisual communication technology and the disclosure of certain medical information by electronic transmission. I acknowledge that I have been given the opportunity to request an in-person assessment or other available alternative prior to the telemedicine visit and am voluntarily participating in the telemedicine visit.  I understand that I have the right to withhold or withdraw my consent to the use of telemedicine in the course of my care at  any time, without affecting my right to future care or treatment, and that the Practitioner or I may terminate the telemedicine visit at any time. I understand that I have the right to inspect all information obtained and/or recorded in the course of the telemedicine visit and may receive copies of available information for a reasonable fee.  I understand that some of the potential risks of receiving the Services via telemedicine include:  [name] Delay or interruption in medical evaluation due to technological equipment failure or disruption; . Information transmitted may not be sufficient (e.g. poor resolution of images) to allow for appropriate medical decision making by the Practitioner; and/or  . In rare instances, security protocols could fail, causing a breach of personal health information.  Furthermore, I acknowledge that it is my responsibility to provide information about my medical history, conditions and care that is complete and accurate to the best of my ability. I acknowledge that Practitioner's advice, recommendations, and/or decision may be based on factors not within their control, such as incomplete or inaccurate data provided by me or  distortions of diagnostic images or specimens that may result from electronic transmissions. I understand that the practice of medicine is not an exact science and that Practitioner makes no warranties or guarantees regarding treatment outcomes. I acknowledge that I will receive a copy of this consent concurrently upon execution via email to the email address I last provided but may also request a printed copy by calling the office of [facility].    I understand that my insurance will be billed for this visit.   I have read or had this consent read to me. . I understand the contents of this consent, which adequately explains the benefits and risks of the Services being provided via telemedicine.  . I have been provided ample opportunity to ask questions  regarding this consent and the Services and have had my questions answered to my satisfaction. . I give my informed consent for the services to be provided through the use of telemedicine in my medical care  By participating in this telemedicine visit I agree to the above.

## 2018-07-30 NOTE — Progress Notes (Signed)
Virtual Visit via Video Note   This visit type was conducted due to national recommendations for restrictions regarding the COVID-19 Pandemic (e.g. social distancing) in an effort to limit this patient's exposure and mitigate transmission in our community.  Due to his co-morbid illnesses, this patient is at least at moderate risk for complications without adequate follow up.  This format is felt to be most appropriate for this patient at this time.  All issues noted in this document were discussed and addressed.  A limited physical exam was performed with this format.  Please refer to the patient's chart for his consent to telehealth for [facility].   Date:  07/31/2018   ID:  [name]., DOB 1936-01-08, MRN [id number]  Patient Location: Home Provider Location: Home  PCP:  Shirline Frees, MD  Cardiologist:  [name], MD  Electrophysiologist:  Constance Haw, MD   Evaluation Performed:  Follow-Up Visit  Chief Complaint:  Follow up   History of Present Illness:    [name] Schools. is a 83 y.o. male with history of atrial fibrillation, ischemic cardiomyopathy s/p ICD placement, chronic systolic CHF, DM, HTN, HLD, CAD s/p 4V CABG in 2007 and carotid artery disease who is being seen today by virtual e-visit due to the [place] pandemic. He underwent a 4V CABG in 2007 (LIMA to LAD, SVG to OM, SVG sequential to PLA/PDA). Carotid artery disease followed in [facility]. Echo 06/17/14 showed LVEF=25-30%, akinesis of the inferoseptum and apex. No significant valve disease. I arranged a cardiac cath on 05/27/14 which showed severe native vessel CAD and 4 patent bypass grafts. He was noted to be in atrial fibrillation the day of the cardiac cath and was started on Eliquis. He has tolerated the Eliquis well. I saw him in October 2018 and he c/o dyspnea with exertion and worsened fatigue. Echo 01/17/17 with LVEF=25%, diffuse hypokinesis of the septal and apical walls. The LV cavity is  severely dilated. Severe MAC. Bilateral atrial enlargement. PA pressure 56 mmHg. He was referred to Dr. Lennie Odor in the EP clinic and an ICD was placed in March 2019. He was incidentally found to have a right pleural effusion at the time of his ICD placement and had thoracentesis May 2019. This has been followed in the pulmonary clinic.   He tells me today that he feels great. NO chest pain or dyspnea. NO LE edema.   The patient does not have symptoms concerning for COVID-19 infection (fever, chills, cough, or new shortness of breath).    Past Medical History:  Diagnosis Date   ANEMIA, HX OF    Bronchial pneumonia Jan. 2016   CAD, ARTERY BYPASS GRAFT    CAROTID ARTERY STENOSIS    Cataract    DIABETES MELLITUS, TYPE II    Diabetic retinal damage of right eye ([place]) 2015   Diverticulosis    HYPERCHOLESTEROLEMIA    HYPERTENSION    Myocardial infarction [facility]) June 2007   New onset atrial fibrillation ([place]) 06/24/2014   PULMONARY HYPERTENSION    RENAL INSUFFICIENCY, CHRONIC    UNSPECIFIED THROMBOCYTOPENIA    Past Surgical History:  Procedure Laterality Date   CARDIAC CATHETERIZATION  06/24/2014   CATARACT EXTRACTION  2012   right eye   COLONOSCOPY     CORONARY ARTERY BYPASS GRAFT      quad bypass/2007   EYE SURGERY     ICD IMPLANT [name] 06/11/2017   Procedure: ICD IMPLANT;  Surgeon: Constance Haw, MD;  Location: [facility];  Service: Cardiovascular;  Laterality: [name];   LEFT HEART CATHETERIZATION WITH CORONARY/GRAFT ANGIOGRAM [name] 06/24/2014   Procedure: LEFT HEART CATHETERIZATION WITH Beatrix Fetters;  Surgeon: Burnell Blanks, MD;  Location: [facility] C[facility];  Service: Cardiovascular;  Laterality: [name];   POLYPECTOMY     TONSILLECTOMY       Current Meds  Medication Sig   apixaban (ELIQUIS) 5 MG TABS tablet Take 1 tablet (5 mg total) by mouth 2 (two) times daily.   aspirin 81 MG tablet Take 81 mg by mouth daily.     calcium-vitamin  D (OSCAL WITH D) 500-200 MG-UNIT per tablet Take 1 tablet by mouth daily.    Cholecalciferol (VITAMIN D3) 2000 UNITS TABS Take 2,000 Units by mouth daily.    Coenzyme Q10 (CO Q 10 PO) Take 300 mg by mouth daily.   ENTRESTO 24-26 MG TAKE 1 TABLET BY MOUTH TWICE DAILY   fish oil-omega-3 fatty acids 1000 MG capsule Take 1 g by mouth daily.     furosemide (LASIX) 20 MG tablet Take 20 mg by mouth daily.   glipiZIDE (GLUCOTROL) 10 MG tablet Take 10 mg by mouth 2 (two) times daily before a meal.   metFORMIN (GLUCOPHAGE) 500 MG tablet Take 2 tablets (1,000 mg total) by mouth 2 (two) times daily with a meal.   metoprolol succinate (TOPROL-XL) 25 MG 24 hr tablet Take 1 tablet (25 mg total) by mouth daily.   Multiple Vitamin (MULTI-DAY VITAMINS PO) Take 1 tablet by mouth daily.    omeprazole (PRILOSEC) 20 MG capsule Take 20 mg by mouth 2 (two) times daily before a meal.    simvastatin (ZOCOR) 20 MG tablet Take 20 mg by mouth daily at 6 PM.    Turmeric 450 MG CAPS Take 450 mg by mouth daily.    vitamin C (ASCORBIC ACID) 500 MG tablet Take 500 mg by mouth daily.    vitamin E 1000 UNIT capsule Take 1,000 Units by mouth every other day.     Allergies:   Patient has no known allergies.   Social History   Tobacco Use   Smoking status: Former Smoker    Types: Cigarettes    Last attempt to quit: 03/26/1970    Years since quitting: 48.3   Smokeless tobacco: Never Used  Substance Use Topics   Alcohol use: Yes    Alcohol/week: 1.0 standard drinks    Types: 1 Glasses of wine per week   Drug use: No     Family Hx: The patient's family history includes Colon cancer in his maternal aunt and maternal uncle; Diverticulitis in his unknown relative; Heart attack in his father and unknown relative; Heart disease (age of onset: 68) in his father; Hyperlipidemia in his father; Hypertension in his father; Stroke in his father; Varicose Veins in his father.  ROS:   Please see the history of  present illness.    All other systems reviewed and are negative.   Prior CV studies:   The following studies were reviewed today:  October 2018: - Procedure narrative: Transthoracic echocardiography. Image   quality was adequate. Intravenous contrast (Definity) was   administered. - Left ventricle: Diffuse hypokinesis with septal and apical   akinesis LV apical band no thrombus. The cavity size was severely   dilated. Wall thickness was normal. The estimated ejection   fraction was 25%. Doppler parameters are consistent with both   elevated ventricular end-diastolic filling pressure and elevated   left atrial filling  pressure. - Mitral valve: Severely calcified annulus. - Left atrium: The atrium was moderately dilated. - Right atrium: The atrium was moderately dilated. - Atrial septum: No defect or patent foramen ovale was identified. - Pulmonary arteries: PA peak pressure: 56 mm Hg (S).  Labs/Other Tests and Data Reviewed:    EKG:  No ECG reviewed.  Recent Labs: 06/02/2018: BUN 34; Creatinine, Ser 1.54; Potassium 4.3; Pro B Natriuretic peptide (BNP) 981.0; Sodium 137   Recent Lipid Panel No results found for: CHOL, TRIG, HDL, CHOLHDL, LDLCALC, LDLDIRECT  Wt Readings from Last 3 Encounters:  07/31/18 180 lb 3.2 oz (81.7 kg)  06/02/18 194 lb 9.6 oz (88.3 kg)  05/16/18 194 lb 7.1 oz (88.2 kg)     Objective:    Vital Signs:  BP 123/60    Pulse 73    Temp (!) 96.7 F (35.9 C)    Ht _0  (1.803 m)    Wt 180 lb 3.2 oz (81.7 kg)    BMI 25.13 kg/m    VITAL SIGNS:  reviewed GEN:  no acute distress  ASSESSMENT & PLAN:    1. CAD s/p CABG without angina: He has no chest pain. Will continue ASA, beta blocker and statin.     2. Hyperlipidemia: Lipids followed in primary care. Will continue statin.    3. Carotid artery disease: Followed in [facility].    4. HTN: BP is well controlled.   5. Ischemic Cardiomyopathy/Chronic systolic CHF: He has severe CAD but bypasses stable at  cath in 2016. LV systolic function has waxed and waned over the years. His LVEF was 30% prior to his bypass in 2007 and improved to 60% post bypass. Most recent echo October 2018 with LVEF=25%. Weight is stable per pt. No LE edema at home. ICD in place. Continue beta blocker, Entresto and Lasix.     6. Atrial fibrillation, persistent: No palpitations. Will continue Toprol and Eliquis.   COVID-19 Education: The signs and symptoms of COVID-19 were discussed with the patient and how to seek care for testing (follow up with PCP or arrange E-visit).  The importance of social distancing was discussed today.  Time:   Today, I have spent 15 minutes with the patient with telehealth technology discussing the above problems.     Medication Adjustments/Labs and Tests Ordered: Current medicines are reviewed at length with the patient today.  Concerns regarding medicines are outlined above.   Tests Ordered: No orders of the defined types were placed in this encounter.   Medication Changes: No orders of the defined types were placed in this encounter.   Disposition:  Follow up in 6 month(s)  Signed, [name], MD  07/31/2018 9:15 AM    [facility]

## 2018-07-31 ENCOUNTER — Other Ambulatory Visit: Payer: Self-pay

## 2018-07-31 ENCOUNTER — Encounter: Payer: Self-pay | Admitting: Cardiovascular Disease

## 2018-07-31 ENCOUNTER — Telehealth (INDEPENDENT_AMBULATORY_CARE_PROVIDER_SITE_OTHER): Payer: PPO | Admitting: Cardiovascular Disease

## 2018-07-31 VITALS — BP 123/60 | HR 73 | Temp 96.7°F | Ht 71.0 in | Wt 180.2 lb

## 2018-07-31 DIAGNOSIS — I4819 Other persistent atrial fibrillation: Secondary | ICD-10-CM

## 2018-07-31 DIAGNOSIS — I10 Essential (primary) hypertension: Secondary | ICD-10-CM | POA: Diagnosis not present

## 2018-07-31 DIAGNOSIS — I2510 Atherosclerotic heart disease of native coronary artery without angina pectoris: Secondary | ICD-10-CM | POA: Diagnosis not present

## 2018-07-31 DIAGNOSIS — I5022 Chronic systolic (congestive) heart failure: Secondary | ICD-10-CM

## 2018-07-31 DIAGNOSIS — E7800 Pure hypercholesterolemia, unspecified: Secondary | ICD-10-CM | POA: Diagnosis not present

## 2018-07-31 DIAGNOSIS — I255 Ischemic cardiomyopathy: Secondary | ICD-10-CM

## 2018-07-31 NOTE — Patient Instructions (Signed)

## 2018-08-07 NOTE — Telephone Encounter (Signed)
**Note De-Identified  Obfuscation** We have not received an approval letter from [employer] but we did receive a [employer] pt asst application for the pt. I called the pt and he insist that he was approved for pt asst with [employer].  I have completed the provider part of a [employer] pt asst application, and gave it to Dr Dr Elmarie Shiley (DOD) nurse awaiting his signature.

## 2018-08-07 NOTE — Telephone Encounter (Signed)
**Note De-Identified  Obfuscation** Dr Acie Fredrickson has signed it and I have faxed it to [employer].

## 2018-09-11 ENCOUNTER — Telehealth: Payer: Self-pay | Admitting: Cardiovascular Disease

## 2018-09-11 NOTE — Telephone Encounter (Signed)
1. What dental office are you calling from? [facility]   2. What is your office phone number? [phone]  3. What is your fax number? [phone]  4. What type of procedure is the patient having performed? Two teeth extracted.   5. What date is procedure scheduled or is the patient there now? 10/30/18 (if the patient is at the dentist's office question goes to their cardiologist if he/she is in the office.  If not, question should go to the DOD).   6. What is your question (ex. Antibiotics prior to procedure, holding medication-we need to know how long dentist wants pt to hold med)? How long to hold his blood thinners and when he can go back on it.

## 2018-09-15 ENCOUNTER — Ambulatory Visit (INDEPENDENT_AMBULATORY_CARE_PROVIDER_SITE_OTHER): Payer: PPO | Admitting: *Deleted

## 2018-09-15 DIAGNOSIS — I5022 Chronic systolic (congestive) heart failure: Secondary | ICD-10-CM

## 2018-09-15 DIAGNOSIS — I255 Ischemic cardiomyopathy: Secondary | ICD-10-CM | POA: Diagnosis not present

## 2018-09-15 LAB — CUP PACEART REMOTE DEVICE CHECK
Battery Remaining Longevity: 91 mo
Battery Remaining Percentage: 88 %
Battery Voltage: 3.07 V
Brady Statistic RV Percent Paced: 1 %
Date Time Interrogation Session: 20200622065148
HighPow Impedance: 62 Ohm
HighPow Impedance: 62 Ohm
Implantable Lead Implant Date: 20190319
Implantable Lead Location: 753860
Implantable Pulse Generator Implant Date: 20190319
Lead Channel Impedance Value: 350 Ohm
Lead Channel Pacing Threshold Amplitude: 0.5 V
Lead Channel Pacing Threshold Pulse Width: 0.5 ms
Lead Channel Sensing Intrinsic Amplitude: 10.6 mV
Lead Channel Setting Pacing Amplitude: 2.5 V
Lead Channel Setting Pacing Pulse Width: 0.5 ms
Lead Channel Setting Sensing Sensitivity: 0.5 mV
Pulse Gen Serial Number: 9811279

## 2018-09-15 NOTE — Telephone Encounter (Signed)
Patient with diagnosis of afib on Eliquis for anticoagulation.    Procedure: two teeth extraction Date of procedure: 10/30/2018  CHADS2-VASc score of  6 (CHF, HTN, AGE, DM2, stroke/tia x 2, CAD, AGE, male)  CrCl 46ml/min  Per office protocol, there is generally no need to hold anticoagultion for teeth extraction of 2 or less.

## 2018-09-22 ENCOUNTER — Other Ambulatory Visit: Payer: Self-pay | Admitting: Cardiovascular Disease

## 2018-09-22 NOTE — Telephone Encounter (Signed)
Prescription refill request for Eliquis received.  Last office visit: Dr. Angelena Form (01-09-2018) Scr: 1.54  (06-02-2018) Age: 83 y.o. Weight: 88.3kg (06-02-2018)  Message sent to Dr. Angelena Form to advise on dosing.

## 2018-09-23 NOTE — Telephone Encounter (Signed)
Burnell Blanks, MD  Lutricia Feil, RN; Thompson Grayer, RN        Agree that his Eliquis dose should be changed to 2.5 mg BID given recent worsening of renal function. Thanks, chris   Previous Messages  ----- Message -----  From: Lutricia Feil, RN  Sent: 09/22/2018  1:43 PM EDT  To: Burnell Blanks, MD   Hi Dr. Angelena Form,   Prescription refill request received for Eliquis 5mg . Pt's last Scr was 1.54 (06/02/2018). His Scr trend includes: Scr:1.37 via [facility] (04-15-2018), Scr: 1.35 via Epic (05-31-2017). Per dosing criteria pt should be on Eliquis 2.5mg . Please advise.   Thank you,

## 2018-09-23 NOTE — Telephone Encounter (Signed)
°*  STAT* If patient is at the pharmacy, call can be transferred to refill team.   1. Which medications need to be refilled? (please list name of each medication and dose if known) Eliquis 5 mg   2. Which pharmacy/location (including street and city if local pharmacy) is medication to be sent to? [facility]  3. Do they need a 30 day or 90 day supply? [place]

## 2018-09-24 NOTE — Progress Notes (Signed)
Remote ICD transmission.   

## 2018-09-30 DIAGNOSIS — D649 Anemia, unspecified: Secondary | ICD-10-CM | POA: Diagnosis not present

## 2018-09-30 DIAGNOSIS — E1122 Type 2 diabetes mellitus with diabetic chronic kidney disease: Secondary | ICD-10-CM | POA: Diagnosis not present

## 2018-09-30 DIAGNOSIS — I429 Cardiomyopathy, unspecified: Secondary | ICD-10-CM | POA: Diagnosis not present

## 2018-09-30 DIAGNOSIS — N183 Chronic kidney disease, stage 3 (moderate): Secondary | ICD-10-CM | POA: Diagnosis not present

## 2018-09-30 DIAGNOSIS — I2510 Atherosclerotic heart disease of native coronary artery without angina pectoris: Secondary | ICD-10-CM | POA: Diagnosis not present

## 2018-09-30 DIAGNOSIS — Z7984 Long term (current) use of oral hypoglycemic drugs: Secondary | ICD-10-CM | POA: Diagnosis not present

## 2018-09-30 DIAGNOSIS — I480 Paroxysmal atrial fibrillation: Secondary | ICD-10-CM | POA: Diagnosis not present

## 2018-09-30 DIAGNOSIS — E782 Mixed hyperlipidemia: Secondary | ICD-10-CM | POA: Diagnosis not present

## 2018-09-30 DIAGNOSIS — I10 Essential (primary) hypertension: Secondary | ICD-10-CM | POA: Diagnosis not present

## 2018-10-13 ENCOUNTER — Ambulatory Visit (INDEPENDENT_AMBULATORY_CARE_PROVIDER_SITE_OTHER): Payer: PPO | Admitting: Pulmonary Disease

## 2018-10-13 ENCOUNTER — Other Ambulatory Visit: Payer: Self-pay

## 2018-10-13 ENCOUNTER — Telehealth: Payer: PPO | Admitting: Pulmonary Disease

## 2018-10-13 ENCOUNTER — Ambulatory Visit: Payer: PPO

## 2018-10-13 ENCOUNTER — Telehealth: Payer: Self-pay | Admitting: Pulmonary Disease

## 2018-10-13 ENCOUNTER — Encounter: Payer: Self-pay | Admitting: Pulmonary Disease

## 2018-10-13 DIAGNOSIS — R0602 Shortness of breath: Secondary | ICD-10-CM | POA: Diagnosis not present

## 2018-10-13 DIAGNOSIS — J90 Pleural effusion, not elsewhere classified: Secondary | ICD-10-CM

## 2018-10-13 NOTE — Patient Instructions (Addendum)
Chest Xray today   Labwork today   Continue lasix 20mg  daily   May need to consider repeat echocardiogram as last one was done in 2018  Continue to weigh yourself daily  We will proceed forward with work-up after completion of chest x-ray  Return in about 4 weeks (around 11/10/2018), or if symptoms worsen or fail to improve, for Follow up with Dr. Vaughan Browner, Follow up with Wyn Quaker FNP-C.   Coronavirus (COVID-19) Are you at risk?  Are you at risk for the Coronavirus (COVID-19)?  To be considered HIGH RISK for Coronavirus (COVID-19), you have to meet the following criteria:  . Traveled to [place], [place], [place], [place] or [place]; or in the [place] to [place], [place], [place], or [place]; and have fever, cough, and shortness of breath within the last 2 weeks of travel OR . Been in close contact with a person diagnosed with COVID-19 within the last 2 weeks and have fever, cough, and shortness of breath . IF YOU DO NOT MEET THESE CRITERIA, YOU ARE CONSIDERED LOW RISK FOR COVID-19.  What to do if you are HIGH RISK for COVID-19?  [name] If you are having a medical emergency, call 911. . Seek medical care right away. Before you go to a doctor's office, urgent care or emergency department, call ahead and tell them about your recent travel, contact with someone diagnosed with COVID-19, and your symptoms. You should receive instructions from your physician's office regarding next steps of care.  . When you arrive at healthcare provider, tell the healthcare staff immediately you have returned from visiting [place], [place], [place], [place] or [place]; or traveled in the [place] to [place], [place], [place], or [place]; in the last two weeks or you have been in close contact with a person diagnosed with COVID-19 in the last 2 weeks.   . Tell the health care staff about your symptoms: fever, cough and shortness of breath. . After you have been seen by a medical provider, you  will be either: o Tested for (COVID-19) and discharged home on quarantine except to seek medical care if symptoms worsen, and asked to  - Stay home and avoid contact with others until you get your results (4-5 days)  - Avoid travel on public transportation if possible (such as bus, train, or airplane) or o Sent to the Emergency Department by EMS for evaluation, COVID-19 testing, and possible admission depending on your condition and test results.  What to do if you are LOW RISK for COVID-19?  Reduce your risk of any infection by using the same precautions used for avoiding the common cold or flu:  [name] Wash your hands often with soap and warm water for at least 20 seconds.  If soap and water are not readily available, use an alcohol-based hand sanitizer with at least 60% alcohol.  . If coughing or sneezing, cover your mouth and nose by coughing or sneezing into the elbow areas of your shirt or coat, into a tissue or into your sleeve (not your hands). . Avoid shaking hands with others and consider head nods or verbal greetings only. . Avoid touching your eyes, nose, or mouth with unwashed hands.  . Avoid close contact with people who are sick. . Avoid places or events with large numbers of people in one location, like concerts or sporting events. . Carefully consider travel plans you have or are making. . If you are planning any travel outside or inside  the [place], visit the CDC's Travelers' Health webpage for the latest health notices. . If you have some symptoms but not all symptoms, continue to monitor at home and seek medical attention if your symptoms worsen. . If you are having a medical emergency, call 911.   [place] / e-Visit: [email or web address]         [facility]: [facility]: [phone]                   [facility]: [phone]            It is flu season:   >>> Best ways to protect herself from the flu: Receive the yearly flu vaccine, practice good hand hygiene washing with soap and also using hand sanitizer when available, eat a nutritious meals, get adequate rest, hydrate appropriately   Please contact the office if your symptoms worsen or you have concerns that you are not improving.   Thank you for choosing [facility] for your healthcare, and for allowing [place] to partner with you on your healthcare journey. I am thankful to be able to provide care to you today.   Wyn Quaker FNP-C

## 2018-10-13 NOTE — Progress Notes (Deleted)
Virtual Visit via Video Note  I connected with [name]. on 10/13/18 at  2:00 PM EDT by a video enabled telemedicine application and verified that I am speaking with the correct person using two identifiers.  Location: Patient: Home Provider: Office - [facility] - [address], [address], [place], [place] [address]  I discussed the limitations of evaluation and management by telemedicine and the availability of in person appointments. The patient expressed understanding and agreed to proceed. I also discussed with the patient that there may be a patient responsible charge related to this service. The patient expressed understanding and agreed to proceed.  Patient consented to consult via telephone: Yes People present and their role in pt care: Pt   History of Present Illness: 83 year old former smoker initially referred to our office on 08/02/2017 for pleural effusion  Past medical history: A. fib, CHF, status post ICD implant, Pulmonary hypertension. PVD. CKD stage 3. Anemia. Hypertension. Diabetes mellitus. Dyslipidemia. Smoking history: Former smoker.  Quit 1972, 30-pack-year smoking history Maintenance: Patient of Dr. Vaughan Browner  Chief complaint: Cough    Observations/Objective:  Chest x-ray 04/13/2014-clear lungs Chest x-ray 06/12/2017-moderate right effusion with right middle lobe, right lower lobe consolidation Chest x-ray 06/19/2017- improvement in right effusion.  06/02/2018- BNP-981  06/02/2018- male-creatinine 1.54, GFR 43.6  08/13/2017- cytology, cytology, pleural fluid analysis-very mild inflammation, no evidence of infection or malignancy LD fluid-51 Triglycerides 11 Body fluid cell count- WBC 236, lymphs 71%, monocyte 25 Amylase-32 Albumin-2.2 Protein-3.1 pH 7.6 Culture-no growth Gram stain-WBCs present, predominantly mononuclear, no organisms seen  06/17/2018-chest x-ray-post CABG and ICD placement, no acute abnormalities, persistent right pleural  effusion  09/13/2017- CT chest without contrast- moderate right pleural effusion with right lower lobe atelectasis, no pulmonary edema or infiltrate identified  01/17/2017-echocardiogram- LV ejection fraction 25%, PA pressure 56,, severely calcified mitral valve annulus, diffuse hypokinesis with septal and apical akinesis LV apical band no thrombus   Assessment and Plan:  No problem-specific Assessment & Plan notes found for this encounter.   Follow Up Instructions:  No follow-ups on file.    I discussed the assessment and treatment plan with the patient. The patient was provided an opportunity to ask questions and all were answered. The patient agreed with the plan and demonstrated an understanding of the instructions.   The patient was advised to call back or seek an in-person evaluation if the symptoms worsen or if the condition fails to improve as anticipated.  I provided *** minutes of non-face-to-face time during this encounter.   Lauraine Rinne, NP

## 2018-10-13 NOTE — Progress Notes (Signed)
Virtual Visit via Telephone Note  I connected with [name]. on 10/13/18 at  2:00 PM EDT by telephone and verified that I am speaking with the correct person using two identifiers.  Location: Patient: Home Provider: Office [facility] - [address], [place], [place], [place] [address]   I discussed the limitations, risks, security and privacy concerns of performing an evaluation and management service by telephone and the availability of in person appointments. I also discussed with the patient that there may be a patient responsible charge related to this service. The patient expressed understanding and agreed to proceed.  Patient consented to consult via telephone: Yes People present and their role in pt care: Pt    History of Present Illness: 83 year old former smoker initially referred to our office on 08/02/2017 for pleural effusion  Past medical history: A. fib, CHF, status post ICD implant, Pulmonary hypertension. PVD. CKD stage 3. Anemia. Hypertension. Diabetes mellitus. Dyslipidemia. Smoking history: Former smoker.  Quit 1972, 30-pack-year smoking history Maintenance: None  Patient of Dr. Vaughan Browner  Chief complaint: Cough  83 year old male completing a tele-visit with our office today due to worsening shortness of breath over the last 2 to 3 months.  Patient also reports that he has had a worsened dry cough.  He was initially referred to our office in 2019 for management of a right pleural effusion.  Last chest x-ray in March/2020 showed persistent right pleural effusion.  Last BNP in March/2020 was elevated.  Patient was treated with a double diuretic dose for 2 weeks at that time.  Patient reports he continues to have shortness of breath.  He denies orthopnea, lower extremity swelling or increased weights.  Patient reports that he continues to weigh himself at home daily and his weights have actually gone down.  Weight today is 172 pounds.  Patient is unsure if he  needs to have another thoracentesis.  Patient denies chest wall pain.  Observations/Objective:  10/13/2018 - weight - 172lb  Chest x-ray 04/13/2014-clear lungs Chest x-ray 06/12/2017-moderate right effusion with right middle lobe, right lower lobe consolidation Chest x-ray 06/19/2017- improvement in right effusion.  06/02/2018- BNP-981  06/02/2018- male-creatinine 1.54, GFR 43.6  08/13/2017- cytology, cytology, pleural fluid analysis-very mild inflammation, no evidence of infection or malignancy LD fluid-51 Triglycerides 11 Body fluid cell count- WBC 236, lymphs 71%, monocyte 25 Amylase-32 Albumin-2.2 Protein-3.1 pH 7.6 Culture-no growth Gram stain-WBCs present, predominantly mononuclear, no organisms seen  06/17/2018-chest x-ray-post CABG and ICD placement, no acute abnormalities, persistent right pleural effusion  09/13/2017- CT chest without contrast- moderate right pleural effusion with right lower lobe atelectasis, no pulmonary edema or infiltrate identified  01/17/2017-echocardiogram- LV ejection fraction 25%, PA pressure 56,, severely calcified mitral valve annulus, diffuse hypokinesis with septal and apical akinesis LV apical band no thrombus  Assessment and Plan:  Pleural effusion Plan: Chest x-ray and lab work today Continue Lasix 20 mg daily Based off chest x-ray may need to consider repeat outpatient thoracentesis Continue to weigh yourself daily   Follow Up Instructions:  Return in about 4 weeks (around 11/10/2018), or if symptoms worsen or fail to improve, for Follow up with Dr. Vaughan Browner, Follow up with Wyn Quaker FNP-C.   I discussed the assessment and treatment plan with the patient. The patient was provided an opportunity to ask questions and all were answered. The patient agreed with the plan and demonstrated an understanding of the instructions.   The patient was advised to call back or seek an  in-person evaluation if the symptoms worsen or if the condition fails  to improve as anticipated.  I provided 23 minutes of non-face-to-face time during this encounter.   Lauraine Rinne, NP

## 2018-10-13 NOTE — Telephone Encounter (Signed)
Primary Pulmonologist: mannam Last office visit and with whom: 3.9.2020 w/ Beth NP What do we see them for (pulmonary problems): Pleural Effusion  Reason for call: called spoke with patient who reports his cough has changed over the last 2 weeks with hoarseness.  Reports cough is dry, "almost like an allergy cough."  Doesn't feel like he can breathe as deeply as he used to be able to.  Patient stated that he has noted some increased cough x6 months but noted again that the cough has changed over the past 2 weeks.  No wheezing, no head congestion, no PND, no sore throat, no f/c/s, no n/v/d/abn pain.  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  MyChart video visit scheduled with Warner Mccreedy NP for today 10/13/18 at 2pm.  In the last month, have you been in contact with someone who was confirmed or suspected to have [place]avirus / COVID-19?  no  Do you have any of the following symptoms developed in the last 30 days? Fever: no Cough: yes Shortness of breath: no  When did your symptoms start?  2+ weeks ago  If the patient has a fever, what is the last reading?  (use n/a if patient denies fever)  n/a . IF THE PATIENT STATES THEY DO NOT OWN A THERMOMETER, THEY MUST GO AND PURCHASE ONE When did the fever start?: n/a Have you taken any medication to suppress a fever (ie Ibuprofen, Aleve, Tylenol)?: n/a

## 2018-10-13 NOTE — Assessment & Plan Note (Addendum)
Plan: Chest x-ray and lab work today Continue Lasix 20 mg daily Based off chest x-ray may need to consider repeat outpatient thoracentesis Continue to weigh yourself daily

## 2018-10-14 ENCOUNTER — Ambulatory Visit (INDEPENDENT_AMBULATORY_CARE_PROVIDER_SITE_OTHER): Payer: PPO

## 2018-10-14 ENCOUNTER — Other Ambulatory Visit (INDEPENDENT_AMBULATORY_CARE_PROVIDER_SITE_OTHER): Payer: PPO

## 2018-10-14 DIAGNOSIS — J90 Pleural effusion, not elsewhere classified: Secondary | ICD-10-CM

## 2018-10-14 DIAGNOSIS — R0602 Shortness of breath: Secondary | ICD-10-CM

## 2018-10-15 LAB — COMPREHENSIVE METABOLIC PANEL
ALT: 8 U/L (ref 0–53)
AST: 22 U/L (ref 0–37)
Albumin: 4 g/dL (ref 3.5–5.2)
Alkaline Phosphatase: 57 U/L (ref 39–117)
BUN: 30 mg/dL — ABNORMAL HIGH (ref 6–23)
CO2: 27 mEq/L (ref 19–32)
Calcium: 9.7 mg/dL (ref 8.4–10.5)
Chloride: 99 mEq/L (ref 96–112)
Creatinine, Ser: 1.34 mg/dL (ref 0.40–1.50)
GFR: 51.15 mL/min — ABNORMAL LOW (ref >60.00)
Glucose, Bld: 68 mg/dL — ABNORMAL LOW (ref 70–99)
Potassium: 4.6 mEq/L (ref 3.5–5.1)
Sodium: 136 mEq/L (ref 135–145)
Total Bilirubin: 0.6 mg/dL (ref 0.2–1.2)
Total Protein: 7.2 g/dL (ref 6.0–8.3)

## 2018-10-15 LAB — CBC WITH DIFFERENTIAL/PLATELET
Basophils Absolute: 0.1 10*3/uL (ref 0.0–0.1)
Basophils Relative: 1.3 % (ref 0.0–3.0)
Eosinophils Absolute: 0.6 10*3/uL (ref 0.0–0.7)
Eosinophils Relative: 6.6 % — ABNORMAL HIGH (ref 0.0–5.0)
HCT: 33.8 % — ABNORMAL LOW (ref 39.0–52.0)
Hemoglobin: 10.7 g/dL — ABNORMAL LOW (ref 13.0–17.0)
Lymphocytes Relative: 9.9 % — ABNORMAL LOW (ref 12.0–46.0)
Lymphs Abs: 0.9 10*3/uL (ref 0.7–4.0)
MCHC: 31.8 g/dL (ref 30.0–36.0)
MCV: 86.3 fl (ref 78.0–100.0)
Monocytes Absolute: 1 10*3/uL (ref 0.1–1.0)
Monocytes Relative: 11.4 % (ref 3.0–12.0)
Neutro Abs: 6.3 10*3/uL (ref 1.4–7.7)
Neutrophils Relative %: 70.8 % (ref 43.0–77.0)
Platelets: 307 10*3/uL (ref 150.0–400.0)
RBC: 3.91 Mil/uL — ABNORMAL LOW (ref 4.22–5.81)
RDW: 17 % — ABNORMAL HIGH (ref 11.5–15.5)
WBC: 8.9 10*3/uL (ref 4.0–10.5)

## 2018-10-15 LAB — PRO B NATRIURETIC PEPTIDE: NT-Pro BNP: 7581 pg/mL — ABNORMAL HIGH (ref 0–486)

## 2018-10-16 ENCOUNTER — Telehealth: Payer: Self-pay | Admitting: *Deleted

## 2018-10-16 NOTE — Progress Notes (Signed)
Chest x-ray showing stable right pleural effusion. Blood work is showing significantly elevated proBNP which is a marker for fluid overload. I have routed these results to your cardiologist. They should be contacting you regarding your diuretic dose. Kidney functioning is stable. I will speak with Dr. Vaughan Browner regarding the right pleural effusion to see if we need to schedule an outpatient thoracentesis.   Please be on the look out for contact from your cardiologist.   Wyn Quaker, FNP

## 2018-10-16 NOTE — Telephone Encounter (Signed)
[name] Blanks, MD  Lauraine Rinne, NP; Thompson Grayer, RN        Signe Colt, I am ok with holding the Eliquis if needed for the procedure. I will get him into our office over the next few days for follow up. I will ask my nurse to call him today to talk about increasing his Lasix to 40 mg daily for the next week given his elevated BNP and worsened dyspnea. His renal function appears to be stable. Gerald Stabs   I spoke with pt and gave him information from Dr. Angelena Form.  Appt scheduled for pt to see Geannie Risen, [place] on July 28,2020 at 8:30

## 2018-10-17 NOTE — Progress Notes (Signed)
Called spoke with patient, advised of lab and cxr results / recs as stated by Aaron Edelman NP.  Pt verbalized understanding and denied any questions.  Patient has already been contacted by cardiologist >> was recommended to double his lasix and he has an appt with cardiology PA on this next Tuesday.

## 2018-10-20 ENCOUNTER — Telehealth: Payer: Self-pay | Admitting: Cardiology

## 2018-10-20 NOTE — Telephone Encounter (Signed)
New Message         COVID-19 Pre-Screening Questions:   In the past 7 to 10 days have you had a cough,  shortness of breath, headache, congestion, fever (100 or greater) body aches, chills, sore throat, or sudden loss of taste or sense of smell? Dry cough he has had for a long time   Have you been around anyone with known Covid 19. NO  Have you been around anyone who is awaiting Covid 19 test results in the past 7 to 10 days? NO  Have you been around anyone who has been exposed to Covid 19, or has mentioned symptoms of Covid 19 within the past 7 to 10 days? NO  If you have any concerns/questions about symptoms patients report during screening (either on the phone or at threshold). Contact the provider seeing the patient or DOD for further guidance.  If neither are available contact a member of the leadership team.

## 2018-10-20 NOTE — Progress Notes (Signed)
Patient was notified of chest x-ray results last week via lab work result note.  Nothing further is needed.  Patient to see cardiology this week after an increase in his diuretics.  We will continue to monitor patient clinically.  May need to consider outpatient thoracentesis if symptoms worsen despite increasing diuretics  I have discussed this case with Dr. Angelena Form patient's cardiologist.  Wyn Quaker, FNP

## 2018-10-21 ENCOUNTER — Ambulatory Visit: Payer: PPO | Admitting: Cardiology

## 2018-10-21 ENCOUNTER — Other Ambulatory Visit: Payer: Self-pay

## 2018-10-21 ENCOUNTER — Telehealth: Payer: Self-pay | Admitting: Pulmonary Disease

## 2018-10-21 ENCOUNTER — Encounter: Payer: Self-pay | Admitting: Cardiology

## 2018-10-21 VITALS — BP 124/68 | HR 75 | Ht 71.0 in | Wt 173.2 lb

## 2018-10-21 DIAGNOSIS — I5023 Acute on chronic systolic (congestive) heart failure: Secondary | ICD-10-CM | POA: Diagnosis not present

## 2018-10-21 DIAGNOSIS — I482 Chronic atrial fibrillation, unspecified: Secondary | ICD-10-CM | POA: Diagnosis not present

## 2018-10-21 DIAGNOSIS — I4891 Unspecified atrial fibrillation: Secondary | ICD-10-CM | POA: Diagnosis not present

## 2018-10-21 DIAGNOSIS — I5022 Chronic systolic (congestive) heart failure: Secondary | ICD-10-CM

## 2018-10-21 DIAGNOSIS — N289 Disorder of kidney and ureter, unspecified: Secondary | ICD-10-CM

## 2018-10-21 DIAGNOSIS — R0600 Dyspnea, unspecified: Secondary | ICD-10-CM

## 2018-10-21 DIAGNOSIS — D649 Anemia, unspecified: Secondary | ICD-10-CM | POA: Diagnosis not present

## 2018-10-21 DIAGNOSIS — J90 Pleural effusion, not elsewhere classified: Secondary | ICD-10-CM

## 2018-10-21 NOTE — Telephone Encounter (Signed)
LMTCB

## 2018-10-21 NOTE — Patient Instructions (Signed)
Medication Instructions:  NONE If you need a refill on your cardiac medications before your next appointment, please call your pharmacy.   Lab work:TODAY CBC BNM BMP If you have labs (blood work) drawn today and your tests are completely normal, you will receive your results only by: [name] MyChart Message (if you have MyChart) OR . A paper copy in the mail If you have any lab test that is abnormal or we need to change your treatment, we will call you to review the results.  Testing/Procedures: Your physician has requested that you have an echocardiogram. Echocardiography is a painless test that uses sound waves to create images of your heart. It provides your doctor with information about the size and shape of your heart and how well your heart's chambers and valves are working. This procedure takes approximately one hour. There are no restrictions for this procedure.    Follow-Up: 2 MONTHS with Dr Angelena Form At [facility], you and your health needs are our priority.  As part of our continuing mission to provide you with exceptional heart care, we have created designated Provider Care Teams.  These Care Teams include your primary Cardiologist (physician) and Advanced Practice Providers (APPs -  Physician Assistants and Nurse Practitioners) who all work together to provide you with the care you need, when you need it. .   Any Other Special Instructions Will Be Listed Below (If Applicable).

## 2018-10-21 NOTE — Telephone Encounter (Signed)
I spoke to the patient about his screening and he states no other symptoms.  Since the cough seems to be chronic, he will still proceed with office visit.

## 2018-10-21 NOTE — Progress Notes (Signed)
10/21/2018 [name].   07-13-1935  [id number]  Primary Physician Shirline Frees, MD Primary Cardiologist: [name], MD  Electrophysiologist: Constance Haw, MD   Reason for Visit/CC: acute on chronic systolic CHF  HPI:  [name]. is a 83 y.o. male with history of atrial fibrillation, ischemic cardiomyopathy s/p ICD placement, chronic systolic CHF, DM, HTN, HLD, CAD s/p 4V CABG in 2007andcarotid artery disease who is being seen today for dyspnea.   He is followed by Dr. Angelena Form. He underwenta4V CABG in 2007 (LIMA to LAD, SVG to OM, SVG sequential to PLA/PDA). Carotid artery disease followed in [facility]. Echo 06/17/14 showed LVEF=25-30%, akinesis of the inferoseptum and apex. No significant valve disease. Dr. Angelena Form arranged a repeat cardiac cath on 05/27/14 which showed severe native vessel CAD and 4 patent bypass grafts. He was noted to be in atrial fibrillation the day of the cardiac cath and was started on Eliquis. He has tolerated the Eliquis well. He was seen back in October 2018 andhe c/o dyspnea with exertion and worsened fatigue. Echo 01/17/17 with LVEF=25%, diffuse hypokinesis of the septal and apical walls. The LV cavity is severely dilated. Severe MAC. Bilateral atrial enlargement. PA pressure 56 mmHg.He wasreferred to Dr. Lennie Odor in the EP clinic andan ICD was placed in March 2019. He was incidentally found to have a right pleural effusion at the time of his ICD placement and had thoracentesis May 2019. This has been followed in the pulmonary clinic.   Hist last visit was a telehealth visit w/ Dr. Angelena Form in May. He was doing well w/o any cardiac symptoms. No CP, dyspnea or edema at that time. No med changes. He was advised to f/u in 6 months time.   He was added on to my scheduled for dyspnea. He recently saw PCP for same issues and had a CXR that showed stable right pleural effusion and proBNP was markedly elevated at 7,581. BMP was also  obtained. SCr was 1.34 and K was 4.6. CBC showed anemia with drop in Hgb from 12 to 10, but this was not addressed. He was instructed to double his lasix from 20 mg to 40 mg daily and he was advised to f/u with cardiology.   He has only lost 3 lb since doubling his lasix. He states that he feels a "little better". Also has a hacking dry cough. No fever or chills. No associated CP. He is very fatigue. Little energy. He only gets SOB w/ activity. No orthopnea or PND.    Cardiac Studies  2D echo 01/17/17 Study Conclusions  - Procedure narrative: Transthoracic echocardiography. Image   quality was adequate. Intravenous contrast (Definity) was   administered. - Left ventricle: Diffuse hypokinesis with septal and apical   akinesis LV apical band no thrombus. The cavity size was severely   dilated. Wall thickness was normal. The estimated ejection   fraction was 25%. Doppler parameters are consistent with both   elevated ventricular end-diastolic filling pressure and elevated   left atrial filling pressure. - Mitral valve: Severely calcified annulus. - Left atrium: The atrium was moderately dilated. - Right atrium: The atrium was moderately dilated. - Atrial septum: No defect or patent foramen ovale was identified. - Pulmonary arteries: PA peak pressure: 56 mm Hg (S).    Current Meds  Medication Sig  . apixaban (ELIQUIS) 2.5 MG TABS tablet Take 1 tablet (2.5 mg total) by mouth 2 (two) times daily.  [name] aspirin 81 MG tablet Take 81 mg  by mouth daily.    . calcium-vitamin D (OSCAL WITH D) 500-200 MG-UNIT per tablet Take 1 tablet by mouth daily.   . Cholecalciferol (VITAMIN D3) 2000 UNITS TABS Take 2,000 Units by mouth daily.   . Coenzyme Q10 (CO Q 10 PO) Take 300 mg by mouth daily.  [name] ENTRESTO 24-26 MG TAKE 1 TABLET BY MOUTH TWICE DAILY  . fish oil-omega-3 fatty acids 1000 MG capsule Take 1 g by mouth daily.    . furosemide (LASIX) 40 MG tablet Take 40 mg by mouth.  [name] glipiZIDE (GLUCOTROL)  10 MG tablet Take 10 mg by mouth 2 (two) times daily before a meal.  . metFORMIN (GLUCOPHAGE) 500 MG tablet Take 2 tablets (1,000 mg total) by mouth 2 (two) times daily with a meal.  . metoprolol succinate (TOPROL-XL) 25 MG 24 hr tablet Take 1 tablet (25 mg total) by mouth daily.  . Multiple Vitamin (MULTI-DAY VITAMINS PO) Take 1 tablet by mouth daily.   [name] omeprazole (PRILOSEC) 20 MG capsule Take 20 mg by mouth 2 (two) times daily before a meal.   . simvastatin (ZOCOR) 20 MG tablet Take 20 mg by mouth daily at 6 PM.   . Turmeric 450 MG CAPS Take 450 mg by mouth daily.   . vitamin C (ASCORBIC ACID) 500 MG tablet Take 500 mg by mouth daily.   . vitamin E 1000 UNIT capsule Take 1,000 Units by mouth every other day.   No Known Allergies Past Medical History:  Diagnosis Date  . ANEMIA, HX OF   . Bronchial pneumonia Jan. 2016  . CAD, ARTERY BYPASS GRAFT   . CAROTID ARTERY STENOSIS   . Cataract   . DIABETES MELLITUS, TYPE II   . Diabetic retinal damage of right eye ([place]) 2015  . Diverticulosis   . HYPERCHOLESTEROLEMIA   . HYPERTENSION   . Myocardial infarction [facility]) June 2007  . New onset atrial fibrillation ([place]) 06/24/2014  . PULMONARY HYPERTENSION   . RENAL INSUFFICIENCY, CHRONIC   . UNSPECIFIED THROMBOCYTOPENIA    Family History  Problem Relation Age of Onset  . Heart disease Father 52       Heart disease before age 56  . Heart attack Father   . Hypertension Father   . Hyperlipidemia Father   . Varicose Veins Father   . Stroke Father        ? not sure  . Colon cancer Maternal Aunt   . Colon cancer Maternal Uncle   . Diverticulitis Other   . Heart attack Other    Past Surgical History:  Procedure Laterality Date  . CARDIAC CATHETERIZATION  06/24/2014  . CATARACT EXTRACTION  2012   right eye  . COLONOSCOPY    . CORONARY ARTERY BYPASS GRAFT      quad bypass/2007  . EYE SURGERY    . ICD IMPLANT [name] 06/11/2017   Procedure: ICD IMPLANT;  Surgeon: Constance Haw, MD;   Location: [facility] CV [facility];  Service: Cardiovascular;  Laterality: [name];  . LEFT HEART CATHETERIZATION WITH CORONARY/GRAFT ANGIOGRAM [name] 06/24/2014   Procedure: LEFT HEART CATHETERIZATION WITH Beatrix Fetters;  Surgeon: Burnell Blanks, MD;  Location: [facility] CATH LAB;  Service: Cardiovascular;  Laterality: [name];  . POLYPECTOMY    . TONSILLECTOMY     Social History   Socioeconomic History  . Marital status: Married    Spouse name: Not on file  . Number of children: 2  . Years of education: Not on file  . Highest  education level: Not on file  Occupational History  . Occupation: Horticulturist, commercial: RETIRED  Social Needs  . Financial resource strain: Not on file  . Food insecurity    Worry: Not on file    Inability: Not on file  . Transportation needs    Medical: Not on file    Non-medical: Not on file  Tobacco Use  . Smoking status: Former Smoker    Types: Cigarettes    Quit date: 03/26/1970    Years since quitting: 48.6  . Smokeless tobacco: Never Used  Substance and Sexual Activity  . Alcohol use: Yes    Alcohol/week: 1.0 standard drinks    Types: 1 Glasses of wine per week  . Drug use: No  . Sexual activity: Not on file  Lifestyle  . Physical activity    Days per week: Not on file    Minutes per session: Not on file  . Stress: Not on file  Relationships  . Social Herbalist on phone: Not on file    Gets together: Not on file    Attends religious service: Not on file    Active member of club or organization: Not on file    Attends meetings of clubs or organizations: Not on file    Relationship status: Not on file  . Intimate partner violence    Fear of current or ex partner: Not on file    Emotionally abused: Not on file    Physically abused: Not on file    Forced sexual activity: Not on file  Other Topics Concern  . Not on file  Social History Narrative  . Not on file     Lipid Panel  No results found for: CHOL, TRIG, HDL,  CHOLHDL, VLDL, LDLCALC, LDLDIRECT  Review of Systems: General: negative for chills, fever, night sweats or weight changes.  Cardiovascular: negative for chest pain, dyspnea on exertion, edema, orthopnea, palpitations, paroxysmal nocturnal dyspnea or shortness of breath Dermatological: negative for rash Respiratory: negative for cough or wheezing Urologic: negative for hematuria Abdominal: negative for nausea, vomiting, diarrhea, bright red blood per rectum, melena, or hematemesis Neurologic: negative for visual changes, syncope, or dizziness All other systems reviewed and are otherwise negative except as noted above.   Physical Exam:  Blood pressure 124/68, pulse 75, height _0  (1.803 m), weight 173 lb 3.2 oz (78.6 kg), SpO2 99 %.  General appearance: alert, cooperative and no distress Neck: no carotid bruit and no JVD Lungs: clear to auscultation bilaterally Heart: irregularly irregular rhythm and regular rate. no murmurs Extremities: 1+ bilateral LEE with chronic venous stasis dermatisi  Pulses: 2+ and symmetric Skin: Skin color, texture, turgor normal. No rashes or lesions Neurologic: Grossly normal  EKG atrial fibrillation 75 bpm -- personally reviewed   ASSESSMENT AND PLAN:   1. Acute on Chronic Systolic HF, NYHA Functional Class III: symptomatic w/ Exertional dyspnea and exertional fatigue. Slight improvement since increasing lasix to 40 mg but still with 1+ bilateral LEE on exam. Fatigue has increased. Poor exercise tolerance, despite compliance w/ Entresto. Will continue current dose of Lasix and will get f/u BMP to ensure renal function and K stable. Will also get f/u BNP to see if interval improvement. If BMP stable, we may try further increasing his lasix for several days to remove more fluid. Will update echo to ensure EF has not worsened. Will also check pulmonary artery pressure. This was high at 56 on last echo. ? Low  output HF. May also consider RHC. I will discuss with  Dr. Angelena Form. Unsure how aggressive we should be given his age. Will defer to primary cardiologist. Also, his pleural effusion may also be largely contributing to symptoms. He is waiting to hear back from pulmonologist regarding possible thoracentesis.   2. Atrial Fibrillation: rate controlled. On Eliquis for a/c. Given abnormal CBC showing anemia, will repeat to ensure no further drop in H/H.   3. Anemia: CBC last week showed drop in Hgb over the last year, down from 12>>10. He is on Eliquis for afib but no overt bleeding. This may be contributing to his dyspnea and fatigue. Will repeat CBC to ensure no further drop.   4. CAD: denies CP. Continue medical therapy.   Follow-Up after echo.   Briani Maul Ladoris Gene, MHS [facility] 10/21/2018 4:12 PM

## 2018-10-21 NOTE — Telephone Encounter (Signed)
Pt is calling back [phone]

## 2018-10-21 NOTE — Telephone Encounter (Signed)
10/21/2018 1452  I have spoken with Dr. Vaughan Browner and he is recommending that we proceed forward with another outpatient thoracentesis.    [place] - Can you please contact the patient and let him know that we are ordering another outpatient thoracentesis to help evaluate his shortness of breath and this pleural effusion.  Please go ahead and place order for outpatient thoracentesis if patient can be scheduled.  He is currently on Eliquis.  This will need to be held for at least 72 hours prior to procedure.  The fluid removal will be done by Interventional radiology               - they will remove not more than 1.5L - keep in mind  they might not be able to get it if is "gel"             - fluid labs to be sent:  . cell count . gram stain and culture . cytology for malignant cells . chemistries for LDH, albumin, amylase, protein, glucose, cholesterol, triglyceride, pH, and lipase             - blood labs to be sent on same day / time: cbc, ldh,  protein, albumin, glucose, and lipase  - Risks of pneumothorax, hemothorax, sedation/anesthesia complications such as cardiac or respiratory arrest or hypotension, stroke and bleeding all explained. Benefits of diagnosis but limitations of non-diagnosis also explained.  You verbalized understanding and wished to proceed.   Patient will need to hold his Eliquis for 72 hours prior to scheduled thoracentesis.  I have already notified cardiology regarding this.  Dr. Camillia Herter office had already approved the patients Eliquis being held last week when I contacted them regarding the potential need for an outpatient thoracentesis.  Patient will need to have follow-up with our office 1 to 2 weeks after outpatient thoracentesis to monitor how he is doing symptomatically.  Will route to Dr. Vaughan Browner and Dr. Angelena Form as Juluis Rainier.   Wyn Quaker, FNP

## 2018-10-22 ENCOUNTER — Other Ambulatory Visit: Payer: Self-pay | Admitting: Pulmonary Disease

## 2018-10-22 LAB — CBC
Hematocrit: 35.1 % — ABNORMAL LOW (ref 37.5–51.0)
Hemoglobin: 11.3 g/dL — ABNORMAL LOW (ref 13.0–17.7)
MCH: 27.2 pg (ref 26.6–33.0)
MCHC: 32.2 g/dL (ref 31.5–35.7)
MCV: 84 fL (ref 79–97)
Platelets: 324 10*3/uL (ref 150–450)
RBC: 4.16 x10E6/uL (ref 4.14–5.80)
RDW: 15.2 % (ref 11.6–15.4)
WBC: 8.8 10*3/uL (ref 3.4–10.8)

## 2018-10-22 LAB — PRO B NATRIURETIC PEPTIDE: NT-Pro BNP: 6802 pg/mL — ABNORMAL HIGH (ref 0–486)

## 2018-10-22 LAB — BASIC METABOLIC PANEL
BUN/Creatinine Ratio: 24 (ref 10–24)
BUN: 35 mg/dL — ABNORMAL HIGH (ref 8–27)
CO2: 25 mmol/L (ref 20–29)
Calcium: 9.8 mg/dL (ref 8.6–10.2)
Chloride: 97 mmol/L (ref 96–106)
Creatinine, Ser: 1.45 mg/dL — ABNORMAL HIGH (ref 0.76–1.27)
GFR calc Af Amer: 52 mL/min/{1.73_m2} — ABNORMAL LOW (ref >59)
GFR calc non Af Amer: 45 mL/min/{1.73_m2} — ABNORMAL LOW (ref >59)
Glucose: 117 mg/dL — ABNORMAL HIGH (ref 65–99)
Potassium: 4.7 mmol/L (ref 3.5–5.2)
Sodium: 140 mmol/L (ref 134–144)

## 2018-10-22 NOTE — Telephone Encounter (Signed)
Thoracentesis has been scheduled for 10/28/2018, COVID test has been scheduled for 10/25/2018. Called pt and schedule follow up for 8/12 with Dr. Vaughan Browner for follow up after thoracentesis.   Will send to [place] as FYI.

## 2018-10-22 NOTE — Telephone Encounter (Signed)
Spoke with pt. He is aware of Brian's message. States that he had an appointment with Cardiology yesterday. They increased his Lasix to 40mg  daily and scheduled him for an echo which is on 10/29/2018. Pt is questioning if Aaron Edelman still wants him to do a thoracentesis knowing this information.  Aaron Edelman - please advise. Thanks.

## 2018-10-22 NOTE — Telephone Encounter (Signed)
Yes were aware of patient's cardiology work-up.  Cardiology is also aware that we would like to proceed forward with a thoracentesis.  It is up to the patient.  What the patient would like to see the if the increase in diuretics helps relieve symptoms and he can.  Our recommendation is to proceed forward with both (thoracentesis and cardiology work-up).  Likely with patient's low ejection fraction on previous echocardiogram as well as high proBNP level he needs increased diuretics as well as a thoracentesis to further evaluate that right pleural effusion.  Wyn Quaker, FNP

## 2018-10-22 NOTE — Telephone Encounter (Signed)
Pt returning call from [place] from yesterday he can be reached @ [phone].Hillery Hunter

## 2018-10-22 NOTE — Telephone Encounter (Signed)
Called and spoke to pt. Informed pt of the recs per Aaron Edelman, including holding his Eliquis 72 hour prior to procedure. Pt is willing to move forward with thoracentesis. Order has been placed for IR thoracentesis.   Will need to keep message in triage to follow up when thora has been scheduled so a 1-2 week follow up can be scheduled per Aaron Edelman.

## 2018-10-24 ENCOUNTER — Telehealth: Payer: Self-pay | Admitting: Cardiovascular Disease

## 2018-10-24 NOTE — Telephone Encounter (Signed)
Informed pt of results. Pt verbalized understanding. 

## 2018-10-24 NOTE — Telephone Encounter (Signed)
New Message    Patient returning [name]'s call for lab results.

## 2018-10-24 NOTE — Telephone Encounter (Signed)
Noted.  I have updated Dr. Vaughan Browner.   I have also confirmed with Burman Nieves that all labs have been ordered and coordinated with IR.  We will keep this encounter open to monitor.  Will route to PM as fyi.   Wyn Quaker FNP

## 2018-10-25 ENCOUNTER — Other Ambulatory Visit (HOSPITAL_COMMUNITY)
Admission: RE | Admit: 2018-10-25 | Discharge: 2018-10-25 | Disposition: A | Discharge status: Home / Self Care | Payer: PPO | Source: Ambulatory Visit | Attending: Pulmonary Disease | Admitting: Pulmonary Disease

## 2018-10-25 DIAGNOSIS — Z20828 Contact with and (suspected) exposure to other viral communicable diseases: Secondary | ICD-10-CM | POA: Diagnosis not present

## 2018-10-25 DIAGNOSIS — Z01812 Encounter for preprocedural laboratory examination: Secondary | ICD-10-CM | POA: Insufficient documentation

## 2018-10-25 LAB — SARS CORONAVIRUS 2 (TAT 6-24 HRS): SARS Coronavirus 2: NEGATIVE

## 2018-10-27 ENCOUNTER — Other Ambulatory Visit: Payer: Self-pay | Admitting: Physician Assistant

## 2018-10-27 ENCOUNTER — Telehealth: Payer: Self-pay

## 2018-10-27 NOTE — Telephone Encounter (Signed)
I spoke to the patient and informed him of his appointment with Dr Angelena Form 9/23 @ 3:20 pm.  He verbalized understanding and thanked me for the call.

## 2018-10-28 ENCOUNTER — Other Ambulatory Visit: Payer: Self-pay | Admitting: Pulmonary Disease

## 2018-10-28 ENCOUNTER — Other Ambulatory Visit: Payer: Self-pay

## 2018-10-28 ENCOUNTER — Other Ambulatory Visit: Payer: Self-pay | Admitting: Radiology

## 2018-10-28 ENCOUNTER — Encounter (HOSPITAL_COMMUNITY): Payer: Self-pay | Admitting: Radiology

## 2018-10-28 ENCOUNTER — Ambulatory Visit (HOSPITAL_COMMUNITY)
Admission: RE | Admit: 2018-10-28 | Discharge: 2018-10-28 | Disposition: A | Discharge status: Home / Self Care | Payer: PPO | Source: Ambulatory Visit | Attending: Pulmonary Disease | Admitting: Pulmonary Disease

## 2018-10-28 DIAGNOSIS — J90 Pleural effusion, not elsewhere classified: Secondary | ICD-10-CM

## 2018-10-28 DIAGNOSIS — J948 Other specified pleural conditions: Secondary | ICD-10-CM | POA: Diagnosis not present

## 2018-10-28 HISTORY — PX: IR THORACENTESIS ASP PLEURAL SPACE W/IMG GUIDE: IMG5380

## 2018-10-28 LAB — LIPASE, BLOOD: Lipase: 24 U/L (ref 11–51)

## 2018-10-28 LAB — CBC
HCT: 37 % — ABNORMAL LOW (ref 39.0–52.0)
Hemoglobin: 11.7 g/dL — ABNORMAL LOW (ref 13.0–17.0)
MCH: 27.6 pg (ref 26.0–34.0)
MCHC: 31.6 g/dL (ref 30.0–36.0)
MCV: 87.3 fL (ref 80.0–100.0)
Platelets: 308 10*3/uL (ref 150–400)
RBC: 4.24 MIL/uL (ref 4.22–5.81)
RDW: 16.6 % — ABNORMAL HIGH (ref 11.5–15.5)
WBC: 8.9 10*3/uL (ref 4.0–10.5)
nRBC: 0 % (ref 0.0–0.2)

## 2018-10-28 LAB — PROTEIN, PLEURAL OR PERITONEAL FLUID: Total protein, fluid: <3 g/dL

## 2018-10-28 LAB — BODY FLUID CELL COUNT WITH DIFFERENTIAL
Eos, Fluid: 0 %
Lymphs, Fluid: 95 %
Monocyte-Macrophage-Serous Fluid: 2 % — ABNORMAL LOW (ref 50–90)
Neutrophil Count, Fluid: 3 % (ref 0–25)
Total Nucleated Cell Count, Fluid: 303 cu mm (ref 0–1000)

## 2018-10-28 LAB — GRAM STAIN

## 2018-10-28 LAB — PROTEIN, TOTAL: Total Protein: 7.2 g/dL (ref 6.5–8.1)

## 2018-10-28 LAB — LACTATE DEHYDROGENASE, PLEURAL OR PERITONEAL FLUID: LD, Fluid: 58 U/L — ABNORMAL HIGH (ref 3–23)

## 2018-10-28 LAB — LACTATE DEHYDROGENASE: LDH: 105 U/L (ref 98–192)

## 2018-10-28 LAB — ALBUMIN: Albumin: 3.5 g/dL (ref 3.5–5.0)

## 2018-10-28 LAB — AMYLASE, PLEURAL OR PERITONEAL FLUID: Amylase, Fluid: 22 U/L

## 2018-10-28 LAB — ALBUMIN, PLEURAL OR PERITONEAL FLUID: Albumin, Fluid: 1.1 g/dL

## 2018-10-28 LAB — GLUCOSE, PLEURAL OR PERITONEAL FLUID: Glucose, Fluid: 84 mg/dL

## 2018-10-28 MED ORDER — LIDOCAINE HCL 1 % IJ SOLN
INTRAMUSCULAR | Status: AC | PRN
  Administered 2018-10-28: 5 mL

## 2018-10-28 MED ORDER — LIDOCAINE HCL 1 % IJ SOLN
INTRAMUSCULAR | Status: AC
  Filled 2018-10-28: qty 20

## 2018-10-28 NOTE — Procedures (Signed)
PROCEDURE SUMMARY:  Successful US guided right thoracentesis. Yielded 620 mL of clear yellow fluid. Pt c/o significant right chest tightness and procedure was aborted at that time. No other immediate complications.  Specimen was sent for labs. CXR ordered.  EBL < 5 mL  Ascencion Dike PA-C 10/28/2018 1:27 PM

## 2018-10-29 ENCOUNTER — Ambulatory Visit (HOSPITAL_COMMUNITY): Payer: PPO | Attending: Internal Medicine

## 2018-10-29 DIAGNOSIS — I482 Chronic atrial fibrillation, unspecified: Secondary | ICD-10-CM | POA: Diagnosis not present

## 2018-10-29 DIAGNOSIS — R0600 Dyspnea, unspecified: Secondary | ICD-10-CM

## 2018-10-29 DIAGNOSIS — I5022 Chronic systolic (congestive) heart failure: Secondary | ICD-10-CM

## 2018-10-29 DIAGNOSIS — N289 Disorder of kidney and ureter, unspecified: Secondary | ICD-10-CM

## 2018-10-29 LAB — TRIGLYCERIDES, BODY FLUIDS: Triglycerides, Fluid: <9 mg/dL

## 2018-10-29 LAB — PH, BODY FLUID: pH, Body Fluid: 7.6

## 2018-10-30 LAB — CHOLESTEROL, BODY FLUID: Cholesterol, Fluid: 21 mg/dL

## 2018-10-31 DIAGNOSIS — I10 Essential (primary) hypertension: Secondary | ICD-10-CM | POA: Diagnosis not present

## 2018-10-31 DIAGNOSIS — Z7984 Long term (current) use of oral hypoglycemic drugs: Secondary | ICD-10-CM | POA: Diagnosis not present

## 2018-10-31 DIAGNOSIS — E782 Mixed hyperlipidemia: Secondary | ICD-10-CM | POA: Diagnosis not present

## 2018-10-31 DIAGNOSIS — E1122 Type 2 diabetes mellitus with diabetic chronic kidney disease: Secondary | ICD-10-CM | POA: Diagnosis not present

## 2018-10-31 NOTE — Progress Notes (Signed)
Thoracentesis work-up is very similar to last years.  There are no malignant cells.  This is good news.  I have followed up with Dr. Vaughan Browner regarding the rest of your results.  These will be reviewed more in person at your office visit on 11/05/2018.  Wyn Quaker, FNP

## 2018-11-02 LAB — CULTURE, BODY FLUID W GRAM STAIN -BOTTLE: Culture: NO GROWTH

## 2018-11-05 ENCOUNTER — Ambulatory Visit: Payer: PPO | Admitting: Pulmonary Disease

## 2018-11-05 ENCOUNTER — Other Ambulatory Visit: Payer: Self-pay

## 2018-11-05 ENCOUNTER — Encounter: Payer: Self-pay | Admitting: Pulmonary Disease

## 2018-11-05 DIAGNOSIS — I6523 Occlusion and stenosis of bilateral carotid arteries: Secondary | ICD-10-CM

## 2018-11-05 DIAGNOSIS — J90 Pleural effusion, not elsewhere classified: Secondary | ICD-10-CM | POA: Diagnosis not present

## 2018-11-05 NOTE — Patient Instructions (Signed)
I have reviewed your pleural fluid images which show mild inflammation with no evidence of malignancy We will get a CT chest without contrast for evaluation of your lung Follow-up in 3 months.

## 2018-11-05 NOTE — Progress Notes (Signed)
[name]    [id number]    12-07-1935  Primary Care Physician:Harris, Gwyndolyn Saxon, MD  Referring Physician: Shirline Frees, MD [address],  [place] [address]  Chief complaint: Follow up for recurrent right pleural effusion  HPI: 83 year old with A fib, CHF status post ICD implantation.  Right effusion was incidentally noted on chest x-ray at the time of ICD placement in March 2019.  Lasix dose was increased and he had a follow-up chest x-ray 1 week later which showed effusion has improved but not completely cleared.  He has been referred to pulmonary for further evaluation.  Underwent IR ultrasound-guided thoracentesis on 08/13/17 with removal of 2 L of clear yellow fluid.  Results show transudative effusion Cultures and cytology are negative.  States that breathing is much improved after thoracentesis.  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  Quit in 1972 Travel history: No significant travel  Interim history: Evaluated for reaccumulation of right pleural effusion.  He underwent second thoracentesis on 11/28/2018.  Procedure aborted after removal of 620 mL of clear level fluid due to significant chest tightness and pain. States that his breathing is improved after thoracentesis.  He is here for review of results.  Outpatient Encounter Medications as of 11/05/2018  Medication Sig  . apixaban (ELIQUIS) 2.5 MG TABS tablet Take 1 tablet (2.5 mg total) by mouth 2 (two) times daily.  [name] aspirin 81 MG tablet Take 81 mg by mouth daily.    . calcium-vitamin D (OSCAL WITH D) 500-200 MG-UNIT per tablet Take 1 tablet by mouth daily.   . Cholecalciferol (VITAMIN D3) 2000 UNITS TABS Take 2,000 Units by mouth daily.   . Coenzyme Q10 (CO Q 10 PO) Take 300 mg by mouth daily.  [name] ENTRESTO 24-26 MG TAKE  1 TABLET BY MOUTH TWICE DAILY  . fish oil-omega-3 fatty acids 1000 MG capsule Take 1 g by mouth daily.    . furosemide (LASIX) 40 MG tablet Take 40 mg by mouth.  [name] glipiZIDE (GLUCOTROL) 10 MG tablet Take 10 mg by mouth 2 (two) times daily before a meal.  . metFORMIN (GLUCOPHAGE) 500 MG tablet Take 2 tablets (1,000 mg total) by mouth 2 (two) times daily with a meal.  . metoprolol succinate (TOPROL-XL) 25 MG 24 hr tablet Take 1 tablet (25 mg total) by mouth daily.  . Multiple Vitamin (MULTI-DAY VITAMINS PO) Take 1 tablet by mouth daily.   [name] omeprazole (PRILOSEC) 20 MG capsule Take 20 mg by mouth 2 (two) times daily before a meal.   . simvastatin (ZOCOR) 20 MG tablet Take 20 mg by mouth daily at 6 PM.   . Turmeric 450 MG CAPS Take 450 mg by mouth daily.   . vitamin C (ASCORBIC ACID) 500 MG tablet Take 500 mg by mouth daily.   . vitamin E 1000 UNIT capsule Take 1,000 Units by mouth every other day.   No facility-administered encounter medications on file as of 11/05/2018.    Physical Exam: Blood pressure (!) 148/68, pulse 68, height 5' 9.5" (1.765 m), weight 87.5 kg, SpO2 98 %. Gen:      No acute distress HEENT:  EOMI, sclera anicteric Neck:     No masses; no thyromegaly Lungs:    Clear to auscultation bilaterally; normal respiratory effort CV:  Regular rate and rhythm; no murmurs Abd:      + bowel sounds; soft, non-tender; no palpable masses, no distension Ext:    No edema; adequate peripheral perfusion Skin:      Warm and dry; no rash Neuro: alert and oriented x 3 Psych: normal mood and affect  Data Reviewed: Chest x-ray 04/13/2014-clear lungs Chest x-ray 06/12/2017-moderate right effusion with right middle lobe, right lower lobe consolidation Chest x-ray 06/19/2017- improvement in right effusion. Chest x-ray 08/23/2017- small right effusion after thoracentesis.  No pneumothorax. CT chest 09/13/17- moderate right pleural effusion with right lower lobe atelectasis.  No pulmonary mass  identified. Chest x-ray 12/23/2017- stable right pleural effusion. Chest x-ray 10/14/2018-chronic right effusion Chest x-ray 10/28/18- improvement in right pleural effusion with small residual effusion. I have reviewed the images personally.  Labs Thoracentesis 08/13/2017 LDH 51, Total protein 3.1  Cell count-WBC 236, 71% lymphs, 25% monocyte macrophage Microbiology-negative Cytology- reactive mesothelial cells, and lymphoid tissue.  Thoracentesis 10/28/2018 LDH 58, total protein<3 Cell count WBC 303, 95% lymphs, 3% neutrophils, 2% monocyte macrophage Micro-negative, cytology-no malignancy  Assessment:  Right pleural effusion, Effusion could be cardiac in nature although would be unusual to have it just on the right side It is transudative with negative cultures and cytology. He will follow-up with cardiology for evaluation of his heart given EF 20-25% and elevated BNP.  May need to increase diuretic dose. Get CT chest to reevaluate underlying lung.  Symptomatically is improved after the thoracentesis.  We will continue to observe this for now [facility] that we can avoid repeated thoracentesis as the last tap was limited by chest pain and low volume yield compared to prior.  I feel that we will have lesser and lesser utility with the thoracentesis going forward.   Plan/Recommendations: - CT chest without contrast  Follow-up in 3 months  Marshell Garfinkel MD [facility] 11/05/2018, 2:35 PM  CC: Shirline Frees, MD

## 2018-11-13 NOTE — Telephone Encounter (Signed)

## 2018-11-14 ENCOUNTER — Ambulatory Visit: Payer: PPO | Admitting: Family

## 2018-11-14 ENCOUNTER — Other Ambulatory Visit: Payer: Self-pay

## 2018-11-14 ENCOUNTER — Ambulatory Visit (HOSPITAL_COMMUNITY)
Admission: RE | Admit: 2018-11-14 | Discharge: 2018-11-14 | Disposition: A | Discharge status: Home / Self Care | Payer: PPO | Source: Ambulatory Visit | Attending: Family | Admitting: Family

## 2018-11-14 DIAGNOSIS — I6523 Occlusion and stenosis of bilateral carotid arteries: Secondary | ICD-10-CM | POA: Diagnosis not present

## 2018-11-19 ENCOUNTER — Ambulatory Visit (INDEPENDENT_AMBULATORY_CARE_PROVIDER_SITE_OTHER): Payer: PPO | Admitting: Family

## 2018-11-19 ENCOUNTER — Other Ambulatory Visit: Payer: Self-pay

## 2018-11-19 ENCOUNTER — Encounter: Payer: Self-pay | Admitting: Family

## 2018-11-19 DIAGNOSIS — I6523 Occlusion and stenosis of bilateral carotid arteries: Secondary | ICD-10-CM | POA: Diagnosis not present

## 2018-11-19 DIAGNOSIS — Z87891 Personal history of nicotine dependence: Secondary | ICD-10-CM

## 2018-11-19 NOTE — Patient Instructions (Signed)

## 2018-11-19 NOTE — Progress Notes (Signed)
Virtual Visit via Telephone Note   I connected with [name]. on 11/19/2018 using the [employer] by telephone and verified that I was speaking with the correct person using two identifiers. Patient was located at his home and accompanied by himself. I am located at the [facility] office/clinic.   The limitations of evaluation and management by telemedicine and the availability of in person appointments have been previously discussed with the patient and are documented in the patients chart. The patient expressed understanding and consented to proceed.  PCP: Shirline Frees, MD  Chief Complaint: follow up extracranial carotid artery stenosis  History of Present Illness: [name]. is a 83 y.o. male whomDr. Ronnie Derby been monitoring forasymptomatic extracranialinternal carotid artery stenosis. He is right-handed. He denies any neurologic deficits specifically no amaurosis fugax, transient ischemic attack or stroke. He reports that he is also stable from a cardiac standpoint. He had his carotid stenosis initially discovered at the time of coronary bypass grafting in 2007 and has had serial ultrasounds since then.  He has not had previous carotid artery intervention. He denies tingling, weakness, or pain in either hand or arm, denies dizziness with raising arms above his head.  He denies claudication symptoms in his legs with walking, denies non healing wounds.  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 [name]   He has not had much swelling in his legs since he lost weight and has been taking furosemide.  New onset atrial fib March 2016, placed on Eliquis, converted to SR with medications about a month later, remains on Eliquis. He was started on furosemide for LE edema and since developing atrial fib; this has helped his LE edema and his breathing.  He had pneumonia January 2016. He had a pleural effusion drained in May or June 2019.    He had an ICD/pacemaker placement in March 2019 by Dr. Curt Bears.  He had a thoracentesis a few weeks ago, and his breathing has improved.   Diabetic: Yes, states his last A1C wasbetween 7.0, states he has since improved his lifestyle habits Tobacco use: former smoker, quit in 1972   Past Medical History:  Diagnosis Date  . ANEMIA, HX OF   . Bronchial pneumonia Jan. 2016  . CAD, ARTERY BYPASS GRAFT   . CAROTID ARTERY STENOSIS   . Cataract   . DIABETES MELLITUS, TYPE II   . Diabetic retinal damage of right eye ([place]) 2015  . Diverticulosis   . HYPERCHOLESTEROLEMIA   . HYPERTENSION   . Myocardial infarction [facility]) June 2007  . New onset atrial fibrillation ([place]) 06/24/2014  . PULMONARY HYPERTENSION   . RENAL INSUFFICIENCY, CHRONIC   . UNSPECIFIED THROMBOCYTOPENIA     Past Surgical History:  Procedure Laterality Date  . CARDIAC CATHETERIZATION  06/24/2014  . CATARACT EXTRACTION  2012   right eye  . COLONOSCOPY    . CORONARY ARTERY BYPASS GRAFT      quad bypass/2007  . EYE SURGERY    . ICD IMPLANT [name] 06/11/2017   Procedure: ICD IMPLANT;  Surgeon: Constance Haw, MD;  Location: [place] CV LAB;  Service: Cardiovascular;  Laterality: [name];  . IR THORACENTESIS ASP PLEURAL SPACE W/IMG GUIDE  10/28/2018  . LEFT HEART CATHETERIZATION WITH CORONARY/GRAFT ANGIOGRAM [name] 06/24/2014   Procedure: LEFT HEART CATHETERIZATION WITH Beatrix Fetters;  Surgeon: Burnell Blanks, MD;  Location: [facility] CATH LAB;  Service: Cardiovascular;  Laterality: [name];  . POLYPECTOMY    .  TONSILLECTOMY      Current Meds  Medication Sig  . apixaban (ELIQUIS) 2.5 MG TABS tablet Take 1 tablet (2.5 mg total) by mouth 2 (two) times daily.  [name] aspirin 81 MG tablet Take 81 mg by mouth daily.    . calcium-vitamin D (OSCAL WITH D) 500-200 MG-UNIT per tablet Take 1 tablet by mouth daily.   . Cholecalciferol (VITAMIN D3) 2000 UNITS TABS Take 2,000 Units by mouth daily.   . Coenzyme Q10 (CO Q 10 PO)  Take 300 mg by mouth daily.  [name] ENTRESTO 24-26 MG TAKE 1 TABLET BY MOUTH TWICE DAILY  . fish oil-omega-3 fatty acids 1000 MG capsule Take 1 g by mouth daily.    . furosemide (LASIX) 40 MG tablet Take 40 mg by mouth.  [name] glipiZIDE (GLUCOTROL) 10 MG tablet Take 10 mg by mouth 2 (two) times daily before a meal.  . metFORMIN (GLUCOPHAGE) 500 MG tablet Take 2 tablets (1,000 mg total) by mouth 2 (two) times daily with a meal.  . metoprolol succinate (TOPROL-XL) 25 MG 24 hr tablet Take 1 tablet (25 mg total) by mouth daily.  . Multiple Vitamin (MULTI-DAY VITAMINS PO) Take 1 tablet by mouth daily.   [name] omeprazole (PRILOSEC) 20 MG capsule Take 20 mg by mouth 2 (two) times daily before a meal.   . simvastatin (ZOCOR) 20 MG tablet Take 20 mg by mouth daily at 6 PM.   . Turmeric 450 MG CAPS Take 450 mg by mouth daily.   . vitamin C (ASCORBIC ACID) 500 MG tablet Take 500 mg by mouth daily.   . vitamin E 1000 UNIT capsule Take 1,000 Units by mouth every other day.    12 system ROS was negative unless otherwise noted in HPI   Observations/Objective:  DATA Carotid Duplex(11-14-18): 60-79% right proximal internal carotid artery stenosis. 1-39%left proximal internal carotid artery stenosis.  Bilateral vertebral artery flow is antegrade.  Bilateral subclavian artery waveforms are normal. No significant change compared to the exam on 11-07-17.    Assessment and Plan: Pt has no history of stroke or TIA.   His atherosclerotic risk factors include currently controlled DM, remote history of smoking, CAD, and chronic renal insufficiency.  He takes Eliquis for a history of atrial fib, takes a daily ASA and a statin. He stays physically active.   Follow Up Instructions:  Follow-up in 101monthswith Carotid Duplex scan.   I discussed the assessment and treatment plan with the patient. The patient was provided an opportunity to ask questions and all were answered. The patient agreed with the plan and  demonstrated an understanding of the instructions.   The patient was advised to call back or seek an in-person evaluation if the symptoms worsen or if the condition fails to improve as anticipated.  I spent 14 minutes with the patient via telephone encounter.   Gabrielle Dare Sonny Poth [facility] Office: [phone]  11/19/2018, 1:07 PM

## 2018-11-26 ENCOUNTER — Ambulatory Visit (INDEPENDENT_AMBULATORY_CARE_PROVIDER_SITE_OTHER)
Admission: RE | Admit: 2018-11-26 | Discharge: 2018-11-26 | Disposition: A | Discharge status: Home / Self Care | Payer: PPO | Source: Ambulatory Visit | Attending: Pulmonary Disease | Admitting: Pulmonary Disease

## 2018-11-26 ENCOUNTER — Other Ambulatory Visit: Payer: Self-pay

## 2018-11-26 DIAGNOSIS — J90 Pleural effusion, not elsewhere classified: Secondary | ICD-10-CM

## 2018-12-09 ENCOUNTER — Telehealth: Payer: Self-pay

## 2018-12-09 NOTE — Telephone Encounter (Signed)
Samples sent back to refill department - never picked up from 10/27/18

## 2018-12-10 ENCOUNTER — Other Ambulatory Visit: Payer: Self-pay | Admitting: *Deleted

## 2018-12-10 DIAGNOSIS — R911 Solitary pulmonary nodule: Secondary | ICD-10-CM

## 2018-12-15 LAB — CUP PACEART REMOTE DEVICE CHECK
Battery Remaining Longevity: 86 mo
Battery Remaining Percentage: 86 %
Battery Voltage: 3.02 V
Brady Statistic RV Percent Paced: 1 %
Date Time Interrogation Session: 20200921060022
HighPow Impedance: 61 Ohm
HighPow Impedance: 61 Ohm
Implantable Lead Implant Date: 20190319
Implantable Lead Location: 753860
Implantable Pulse Generator Implant Date: 20190319
Lead Channel Impedance Value: 350 Ohm
Lead Channel Pacing Threshold Amplitude: 0.5 V
Lead Channel Pacing Threshold Pulse Width: 0.5 ms
Lead Channel Sensing Intrinsic Amplitude: 9.2 mV
Lead Channel Setting Pacing Amplitude: 2.5 V
Lead Channel Setting Pacing Pulse Width: 0.5 ms
Lead Channel Setting Sensing Sensitivity: 0.5 mV
Pulse Gen Serial Number: 9811279

## 2018-12-16 ENCOUNTER — Ambulatory Visit (INDEPENDENT_AMBULATORY_CARE_PROVIDER_SITE_OTHER): Payer: PPO | Admitting: *Deleted

## 2018-12-16 DIAGNOSIS — I255 Ischemic cardiomyopathy: Secondary | ICD-10-CM

## 2018-12-16 DIAGNOSIS — I4891 Unspecified atrial fibrillation: Secondary | ICD-10-CM

## 2018-12-17 ENCOUNTER — Ambulatory Visit: Payer: PPO | Admitting: Cardiovascular Disease

## 2018-12-17 ENCOUNTER — Other Ambulatory Visit: Payer: Self-pay

## 2018-12-17 ENCOUNTER — Encounter: Payer: Self-pay | Admitting: Cardiovascular Disease

## 2018-12-17 VITALS — BP 120/62 | HR 66 | Ht 71.0 in | Wt 178.0 lb

## 2018-12-17 DIAGNOSIS — I6523 Occlusion and stenosis of bilateral carotid arteries: Secondary | ICD-10-CM | POA: Diagnosis not present

## 2018-12-17 DIAGNOSIS — H43812 Vitreous degeneration, left eye: Secondary | ICD-10-CM | POA: Diagnosis not present

## 2018-12-17 DIAGNOSIS — H35371 Puckering of macula, right eye: Secondary | ICD-10-CM | POA: Diagnosis not present

## 2018-12-17 DIAGNOSIS — I255 Ischemic cardiomyopathy: Secondary | ICD-10-CM

## 2018-12-17 DIAGNOSIS — I2510 Atherosclerotic heart disease of native coronary artery without angina pectoris: Secondary | ICD-10-CM | POA: Diagnosis not present

## 2018-12-17 DIAGNOSIS — I10 Essential (primary) hypertension: Secondary | ICD-10-CM

## 2018-12-17 DIAGNOSIS — I5022 Chronic systolic (congestive) heart failure: Secondary | ICD-10-CM | POA: Diagnosis not present

## 2018-12-17 DIAGNOSIS — I482 Chronic atrial fibrillation, unspecified: Secondary | ICD-10-CM

## 2018-12-17 DIAGNOSIS — E113593 Type 2 diabetes mellitus with proliferative diabetic retinopathy without macular edema, bilateral: Secondary | ICD-10-CM | POA: Diagnosis not present

## 2018-12-17 MED ORDER — FUROSEMIDE 40 MG PO TABS: 40.0000 mg | ORAL_TABLET | Freq: Every day | ORAL | 3 refills | Status: DC

## 2018-12-17 NOTE — Patient Instructions (Signed)
Medication Instructions:  1) Lasix 40 mg daily has been refilled for you  Labwork: TODAY: BMET  Testing/Procedures: None  Follow-Up: Your provider wants you to follow-up in: 6 months with Dr. Angelena Form. You will receive a reminder letter in the mail two months in advance. If you don't receive a letter, please call our office to schedule the follow-up appointment.

## 2018-12-17 NOTE — Progress Notes (Signed)
Chief Complaint  Patient presents with  . Follow-up    CHF     History of Present Illness: 83 yo male with history of atrial fibrillation, ischemic cardiomyopathy s/p ICD placement, chronic systolic CHF, DM, HTN, HLD, CAD s/p 4V CABG in 2007 and carotid artery disease here today for follow up. He underwent a 4V CABG in 2007 (LIMA to LAD, SVG to OM, SVG sequential to PLA/PDA). Carotid artery disease followed in [facility]. Echo 06/17/14 showed LVEF=25-30%, akinesis of the inferoseptum and apex. No significant valve disease. I arranged a cardiac cath on 05/27/14 which showed severe native vessel CAD and 4 patent bypass grafts. He was noted to be in atrial fibrillation the day of the cardiac cath and was started on Eliquis. He has tolerated the Eliquis well. I saw him in October 2018 and he c/o dyspnea with exertion and worsened fatigue. Echo 01/17/17 with LVEF=25%, diffuse hypokinesis of the septal and apical walls. The LV cavity is severely dilated. Severe MAC. Bilateral atrial enlargement. PA pressure 56 mmHg. He was referred to Dr. Lennie Odor in the EP clinic and an ICD was placed in March 2019. He was incidentally found to have a right pleural effusion at the time of his ICD placement and had thoracentesis May 2019. This has been followed in the pulmonary clinic. He was seen in our office October 21, 2018 by Lyda Jester, PA with c/o worsened dyspnea and lower extremity edema. Lasix was increased and he was seen back in the pulmonary office. He underwent repeat thoracentesis in August 2020. He has since followed up with Dr. Vaughan Browner. Echo 10/29/18 with LVEF=20-25%. No significant valve disease.   He is here today for follow up. The patient denies any chest pain, palpitations, lower extremity edema, orthopnea, PND, dizziness, near syncope or syncope. His dyspnea is much improved. He has been taking Lasix 40 mg daily. Minimal LE edema.  Primary Care Physician: Shirline Frees, MD  Past Medical History:  Diagnosis  Date  . ANEMIA, HX OF   . Bronchial pneumonia Jan. 2016  . CAD, ARTERY BYPASS GRAFT   . CAROTID ARTERY STENOSIS   . Cataract   . DIABETES MELLITUS, TYPE II   . Diabetic retinal damage of right eye ([place]) 2015  . Diverticulosis   . HYPERCHOLESTEROLEMIA   . HYPERTENSION   . Myocardial infarction [facility]) June 2007  . New onset atrial fibrillation ([place]) 06/24/2014  . PULMONARY HYPERTENSION   . RENAL INSUFFICIENCY, CHRONIC   . UNSPECIFIED THROMBOCYTOPENIA     Past Surgical History:  Procedure Laterality Date  . CARDIAC CATHETERIZATION  06/24/2014  . CATARACT EXTRACTION  2012   right eye  . COLONOSCOPY    . CORONARY ARTERY BYPASS GRAFT      quad bypass/2007  . EYE SURGERY    . ICD IMPLANT [name] 06/11/2017   Procedure: ICD IMPLANT;  Surgeon: Constance Haw, MD;  Location: [place] CV LAB;  Service: Cardiovascular;  Laterality: [name];  . IR THORACENTESIS ASP PLEURAL SPACE W/IMG GUIDE  10/28/2018  . LEFT HEART CATHETERIZATION WITH CORONARY/GRAFT ANGIOGRAM [name] 06/24/2014   Procedure: LEFT HEART CATHETERIZATION WITH Beatrix Fetters;  Surgeon: Burnell Blanks, MD;  Location: [facility] CATH LAB;  Service: Cardiovascular;  Laterality: [name];  . POLYPECTOMY    . TONSILLECTOMY      Current Outpatient Medications  Medication Sig Dispense Refill  . apixaban (ELIQUIS) 2.5 MG TABS tablet Take 1 tablet (2.5 mg total) by mouth 2 (two) times daily. 180 tablet 1  .  aspirin 81 MG tablet Take 81 mg by mouth daily.      . calcium-vitamin D (OSCAL WITH D) 500-200 MG-UNIT per tablet Take 1 tablet by mouth daily.     . Cholecalciferol (VITAMIN D3) 2000 UNITS TABS Take 2,000 Units by mouth daily.     . Coenzyme Q10 (CO Q 10 PO) Take 300 mg by mouth daily.    [name] ENTRESTO 24-26 MG TAKE 1 TABLET BY MOUTH TWICE DAILY 180 tablet 2  . fish oil-omega-3 fatty acids 1000 MG capsule Take 1 g by mouth daily.      . furosemide (LASIX) 40 MG tablet Take 1 tablet (40 mg total) by mouth daily. 90 tablet 3  .  glipiZIDE (GLUCOTROL) 10 MG tablet Take 10 mg by mouth 2 (two) times daily before a meal.    . metFORMIN (GLUCOPHAGE) 500 MG tablet Take 2 tablets (1,000 mg total) by mouth 2 (two) times daily with a meal.    . metoprolol succinate (TOPROL-XL) 25 MG 24 hr tablet Take 1 tablet (25 mg total) by mouth daily. 90 tablet 3  . Multiple Vitamin (MULTI-DAY VITAMINS PO) Take 1 tablet by mouth daily.     [name] omeprazole (PRILOSEC) 20 MG capsule Take 20 mg by mouth 2 (two) times daily before a meal.     . simvastatin (ZOCOR) 20 MG tablet Take 20 mg by mouth daily at 6 PM.     . Turmeric 450 MG CAPS Take 450 mg by mouth daily.     . vitamin C (ASCORBIC ACID) 500 MG tablet Take 500 mg by mouth daily.     . vitamin E 1000 UNIT capsule Take 1,000 Units by mouth every other day.     No current facility-administered medications for this visit.     No Known Allergies  Social History   Socioeconomic History  . Marital status: Married    Spouse name: Not on file  . Number of children: 2  . Years of education: Not on file  . Highest education level: Not on file  Occupational History  . Occupation: Horticulturist, commercial: RETIRED  Social Needs  . Financial resource strain: Not on file  . Food insecurity    Worry: Not on file    Inability: Not on file  . Transportation needs    Medical: Not on file    Non-medical: Not on file  Tobacco Use  . Smoking status: Former Smoker    Types: Cigarettes    Quit date: 03/26/1970    Years since quitting: 48.7  . Smokeless tobacco: Never Used  Substance and Sexual Activity  . Alcohol use: Yes    Alcohol/week: 1.0 standard drinks    Types: 1 Glasses of wine per week  . Drug use: No  . Sexual activity: Not on file  Lifestyle  . Physical activity    Days per week: Not on file    Minutes per session: Not on file  . Stress: Not on file  Relationships  . Social Herbalist on phone: Not on file    Gets together: Not on file    Attends religious  service: Not on file    Active member of club or organization: Not on file    Attends meetings of clubs or organizations: Not on file    Relationship status: Not on file  . Intimate partner violence    Fear of current or ex partner: Not on file    Emotionally  abused: Not on file    Physically abused: Not on file    Forced sexual activity: Not on file  Other Topics Concern  . Not on file  Social History Narrative  . Not on file    Family History  Problem Relation Age of Onset  . Heart disease Father 86       Heart disease before age 11  . Heart attack Father   . Hypertension Father   . Hyperlipidemia Father   . Varicose Veins Father   . Stroke Father        ? not sure  . Colon cancer Maternal Aunt   . Colon cancer Maternal Uncle   . Diverticulitis Other   . Heart attack Other     Review of Systems:  As stated in the HPI and otherwise negative.   BP 120/62   Pulse 66   Ht _0  (1.803 m)   Wt 178 lb (80.7 kg)   SpO2 99%   BMI 24.83 kg/m   Physical Examination:  General: Well developed, well nourished, NAD  HEENT: OP clear, mucus membranes moist  SKIN: warm, dry. No rashes. Neuro: No focal deficits  Musculoskeletal: Muscle strength 5/5 all ext  Psychiatric: Mood and affect normal  Neck: No JVD, no carotid bruits, no thyromegaly, no lymphadenopathy.  Lungs:Clear bilaterally, no wheezes, rhonci, crackles Cardiovascular: Irreg irreg. No murmurs, gallops or rubs. Abdomen:Soft. Bowel sounds present. Non-tender.  Extremities: Trivial bilateral lower extremity edema.   Echo 10/29/18:  1. The left ventricle has severely reduced systolic function, with an ejection fraction of 20-25%. The cavity size was normal. Left ventricular diastolic function could not be evaluated secondary to atrial fibrillation.  2. The right ventricle has moderately reduced systolic function. The cavity was mildly enlarged. There is no increase in right ventricular wall thickness.  3. Left atrial  size was severely dilated.  4. Right atrial size was mildly dilated.  5. Mildly thickened tricuspid valve leaflets.  6. The aortic valve is grossly normal. No stenosis of the aortic valve. Mild to moderate aortic annular calcification noted.  7. The mitral valve is grossly normal. Mild thickening of the mitral valve leaflet. Mild calcification of the mitral valve leaflet.  8. The tricuspid valve is grossly normal.  9. The aorta is normal in size and structure. 10. The aortic root and ascending aorta are normal in size and structure. 11. The inferior vena cava was dilated in size with <50% respiratory variability. 12. The interatrial septum was not assessed.  Cardiac cath 06/24/14: Hemodynamic Findings: Central aortic pressure: 124/62 Left ventricular pressure: 130/3/9 Angiographic Findings: Left main: 30% mid stenosis.  Left Anterior Descending Artery: Large caliber diffusely diseased vessel that courses to the apex. The proximal vessel has a 95% stenosis. There is a small caliber diagonal branch that is supplied by the proximal LAD.The mid LAD is occluded. The mid and distal LAD fills from the patent IMA graft. There is a moderate caliber septal perforating branch.  Circumflex Artery: Moderate caliber vessel with proximal 80% stenosis, 100% mid occlusion. The obtuse marginal branch is occluded and fills from the patent vein graft.  Right Coronary Artery: Large dominant vessel with diffuse 30% proximal stenosis, 95% mid stenosis, diffuse 30% distal stenosis. There is diffuse 90% distal stenosis just before the bifurcation. The PLA and PDA fill antegrade and from the patent sequential vein grafts.  Graft Anatomy:  SVG to OM is patent. There are two valves in the body of the vein graft  and the appearance of this graft is unchanged from last cath in 2010.  SVG sequential to PLA/PDA is patent LIMA to mid LAD is patent. The target LAD vessel is small and diffusely diseased.  Left Ventricular  Angiogram: Deferred.   EKG:  EKG is not ordered today. The ekg ordered today demonstrates   Recent Labs: 10/14/2018: ALT 8 10/21/2018: BUN 35; Creatinine, Ser 1.45; NT-Pro BNP 6,802; Potassium 4.7; Sodium 140 10/28/2018: Hemoglobin 11.7; Platelets 308   Lipid Panel  Followed in primary care   Wt Readings from Last 3 Encounters:  12/17/18 178 lb (80.7 kg)  11/05/18 173 lb 3.2 oz (78.6 kg)  10/21/18 173 lb 3.2 oz (78.6 kg)    Other studies Reviewed: Additional studies/ records that were reviewed today include: . Review of the above records demonstrates:  Assessment and Plan:   1. CAD s/p CABG without angina: He has no chest pain. Will continue ASA, beta blocker and statin.     2. Hyperlipidemia: Lipids followed in primary care. Last LDL 69 in August 2020. Continue statin.    3. Carotid artery disease: Followed in [facility].    4. HTN: BP is controlled. No changes.   5. Ischemic Cardiomyopathy/Chronic systolic CHF: He has severe CAD but bypasses stable at cath in 2016. LV systolic function has waxed and waned over the years. His LVEF was 30% prior to his bypass in 2007 and improved to 60% post bypass. Most recent echo August 2020 with LVEF=25%. Volume status much better on higher dose of Lasix. Will continue Lasix 40 mg daily, beta blocker and Entresto. ICD in place. Check BMET today.   6. Atrial fibrillation, persistent: Rate controlled atrial fib today. Continue Toprol and Eliquis.       Current medicines are reviewed at length with the patient today.  The patient does not have concerns regarding medicines.  The following changes have been made:   Labs/ tests ordered today include:   Orders Placed This Encounter  Procedures  . Basic metabolic panel    Disposition:   FU with me or care team APP in six months     Signed, [name], MD 12/17/2018 3:50 PM    [facility] [address] [place], [place]  [id number] Phone: [phone]; Fax: [phone]

## 2018-12-18 ENCOUNTER — Telehealth: Payer: Self-pay

## 2018-12-18 ENCOUNTER — Encounter (HOSPITAL_COMMUNITY)
Admission: RE | Admit: 2018-12-18 | Discharge: 2018-12-18 | Disposition: A | Discharge status: Home / Self Care | Payer: PPO | Source: Ambulatory Visit | Attending: Pulmonary Disease | Admitting: Pulmonary Disease

## 2018-12-18 DIAGNOSIS — I4891 Unspecified atrial fibrillation: Secondary | ICD-10-CM | POA: Insufficient documentation

## 2018-12-18 DIAGNOSIS — I700 Atherosclerosis of aorta: Secondary | ICD-10-CM | POA: Insufficient documentation

## 2018-12-18 DIAGNOSIS — Z79899 Other long term (current) drug therapy: Secondary | ICD-10-CM | POA: Diagnosis not present

## 2018-12-18 DIAGNOSIS — I509 Heart failure, unspecified: Secondary | ICD-10-CM | POA: Diagnosis not present

## 2018-12-18 DIAGNOSIS — R911 Solitary pulmonary nodule: Secondary | ICD-10-CM | POA: Diagnosis not present

## 2018-12-18 DIAGNOSIS — Z95810 Presence of automatic (implantable) cardiac defibrillator: Secondary | ICD-10-CM | POA: Diagnosis not present

## 2018-12-18 DIAGNOSIS — Z7901 Long term (current) use of anticoagulants: Secondary | ICD-10-CM | POA: Insufficient documentation

## 2018-12-18 DIAGNOSIS — I517 Cardiomegaly: Secondary | ICD-10-CM | POA: Diagnosis not present

## 2018-12-18 DIAGNOSIS — I5022 Chronic systolic (congestive) heart failure: Secondary | ICD-10-CM

## 2018-12-18 DIAGNOSIS — J90 Pleural effusion, not elsewhere classified: Secondary | ICD-10-CM | POA: Diagnosis not present

## 2018-12-18 DIAGNOSIS — J181 Lobar pneumonia, unspecified organism: Secondary | ICD-10-CM | POA: Diagnosis not present

## 2018-12-18 LAB — BASIC METABOLIC PANEL WITH GFR
BUN/Creatinine Ratio: 24 (ref 10–24)
BUN: 42 mg/dL — ABNORMAL HIGH (ref 8–27)
CO2: 25 mmol/L (ref 20–29)
Calcium: 9.5 mg/dL (ref 8.6–10.2)
Chloride: 102 mmol/L (ref 96–106)
Creatinine, Ser: 1.77 mg/dL — ABNORMAL HIGH (ref 0.76–1.27)
GFR calc Af Amer: 41 mL/min/1.73 — ABNORMAL LOW
GFR calc non Af Amer: 35 mL/min/1.73 — ABNORMAL LOW
Glucose: 130 mg/dL — ABNORMAL HIGH (ref 65–99)
Potassium: 5.2 mmol/L (ref 3.5–5.2)
Sodium: 139 mmol/L (ref 134–144)

## 2018-12-18 LAB — GLUCOSE, CAPILLARY: Glucose-Capillary: 60 mg/dL — ABNORMAL LOW (ref 70–99)

## 2018-12-18 MED ORDER — FUROSEMIDE 20 MG PO TABS: ORAL_TABLET | ORAL | 3 refills | Status: DC

## 2018-12-18 MED ORDER — FLUDEOXYGLUCOSE F - 18 (FDG) INJECTION
9.0000 | Freq: Once | INTRAVENOUS | Status: AC | PRN
  Administered 2018-12-18: 9 via INTRAVENOUS

## 2018-12-18 NOTE — Telephone Encounter (Signed)
-----   Message from Burnell Blanks, MD sent at 12/18/2018  7:46 AM EDT ----- Renal function is mildly worse. He has recently been taking Lasix 40 mg daily. I would have him alternate lasix dosing by taking 40 mg one day and 20 mg the next day. He can take an extra lasix on days that he has more swelling or if he sees his weight go up. Repeat BMET in 10 days. Thanks, chris

## 2018-12-19 ENCOUNTER — Telehealth: Payer: Self-pay | Admitting: Pulmonary Disease

## 2018-12-19 NOTE — Telephone Encounter (Signed)
Pt made aware of PET scan results Nothing further needed.

## 2018-12-25 NOTE — Progress Notes (Signed)
Remote ICD transmission.   

## 2018-12-29 ENCOUNTER — Other Ambulatory Visit: Payer: Self-pay

## 2018-12-29 ENCOUNTER — Other Ambulatory Visit: Payer: PPO | Admitting: *Deleted

## 2018-12-29 DIAGNOSIS — I5022 Chronic systolic (congestive) heart failure: Secondary | ICD-10-CM

## 2018-12-30 ENCOUNTER — Telehealth: Payer: Self-pay

## 2018-12-30 LAB — BASIC METABOLIC PANEL
BUN/Creatinine Ratio: 22 (ref 10–24)
BUN: 35 mg/dL — ABNORMAL HIGH (ref 8–27)
CO2: 25 mmol/L (ref 20–29)
Calcium: 9.4 mg/dL (ref 8.6–10.2)
Chloride: 98 mmol/L (ref 96–106)
Creatinine, Ser: 1.61 mg/dL — ABNORMAL HIGH (ref 0.76–1.27)
GFR calc Af Amer: 46 mL/min/{1.73_m2} — ABNORMAL LOW (ref >59)
GFR calc non Af Amer: 40 mL/min/{1.73_m2} — ABNORMAL LOW (ref >59)
Glucose: 88 mg/dL (ref 65–99)
Potassium: 4.5 mmol/L (ref 3.5–5.2)
Sodium: 137 mmol/L (ref 134–144)

## 2018-12-30 NOTE — Telephone Encounter (Signed)
Notes recorded by Frederik Schmidt, RN on 12/30/2018 at 10:32 AM EDT  [id number] 10/6  ------

## 2018-12-30 NOTE — Telephone Encounter (Signed)
-----   Message from Burnell Blanks, MD sent at 12/30/2018 10:30 AM EDT ----- Renal function is stable. I would continue with the current regimen of Lasix if he is doing ok. Thanks, chris

## 2018-12-30 NOTE — Telephone Encounter (Signed)
Notes recorded by Frederik Schmidt, RN on 12/30/2018 at 1:33 PM EDT  The patient has been notified of the result and verbalized understanding. All questions (if any) were answered.  Frederik Schmidt, RN 12/30/2018 1:33 PM

## 2019-01-09 ENCOUNTER — Other Ambulatory Visit: Payer: Self-pay

## 2019-01-09 DIAGNOSIS — Z20822 Contact with and (suspected) exposure to covid-19: Secondary | ICD-10-CM

## 2019-01-09 DIAGNOSIS — Z20828 Contact with and (suspected) exposure to other viral communicable diseases: Secondary | ICD-10-CM | POA: Diagnosis not present

## 2019-01-11 LAB — NOVEL CORONAVIRUS, NAA: SARS-CoV-2, NAA: NOT DETECTED

## 2019-01-20 ENCOUNTER — Other Ambulatory Visit: Payer: Self-pay | Admitting: Cardiovascular Disease

## 2019-01-20 MED ORDER — ENTRESTO 24-26 MG PO TABS: 1.0000 | ORAL_TABLET | Freq: Two times a day (BID) | ORAL | 3 refills | Status: DC

## 2019-01-23 DIAGNOSIS — E1122 Type 2 diabetes mellitus with diabetic chronic kidney disease: Secondary | ICD-10-CM | POA: Diagnosis not present

## 2019-01-23 DIAGNOSIS — E782 Mixed hyperlipidemia: Secondary | ICD-10-CM | POA: Diagnosis not present

## 2019-01-23 DIAGNOSIS — I480 Paroxysmal atrial fibrillation: Secondary | ICD-10-CM | POA: Diagnosis not present

## 2019-01-23 DIAGNOSIS — D649 Anemia, unspecified: Secondary | ICD-10-CM | POA: Diagnosis not present

## 2019-01-23 DIAGNOSIS — I129 Hypertensive chronic kidney disease with stage 1 through stage 4 chronic kidney disease, or unspecified chronic kidney disease: Secondary | ICD-10-CM | POA: Diagnosis not present

## 2019-01-23 DIAGNOSIS — I2510 Atherosclerotic heart disease of native coronary artery without angina pectoris: Secondary | ICD-10-CM | POA: Diagnosis not present

## 2019-01-23 DIAGNOSIS — N183 Chronic kidney disease, stage 3 unspecified: Secondary | ICD-10-CM | POA: Diagnosis not present

## 2019-01-23 DIAGNOSIS — I10 Essential (primary) hypertension: Secondary | ICD-10-CM | POA: Diagnosis not present

## 2019-03-02 ENCOUNTER — Telehealth: Payer: Self-pay | Admitting: Cardiovascular Disease

## 2019-03-02 NOTE — Telephone Encounter (Signed)
I spoke with pt and gave him instructions from Dr Angelena Form.  Appointment made for pt to see B.Bhagat, PA on 03/06/19 at 9:00

## 2019-03-02 NOTE — Telephone Encounter (Signed)
I spoke with pt. He reports swelling in both legs. Goes to above thighs. First noticed about 2-3 weeks ago but has gotten worse in last week. Swelling is present in the AM and worsens throughout the day.  Has been wearing moderate compression hose which helps with leg pain but has been having trouble getting these on due to swelling. He weighs daily and weight has been 184 lbs for last few days.  Weight prior to recent weight gain was 164-172 lbs. Started gaining weight over the last 3-4 weeks. Has chronic shortness of breath and this has not worsened.  Per lab results from 9/23 lab work dose of lasix was changed to 40 mg alternating with 20 mg.  Additional dose as needed. Pt has been taking alternating dose but not using any prn lasix. Today's dose was 20 mg.  I told him to take additional dose of 20 mg now and I would send message to Dr Angelena Form regarding weight gain and swelling.

## 2019-03-02 NOTE — Telephone Encounter (Signed)
He has had mild worsening of his renal function in the past on higher doses of Lasix. I would have him take 40 mg of Lasix daily this week. Is there any way to get him in with an APP in the office this week? Gerald Stabs

## 2019-03-02 NOTE — Telephone Encounter (Signed)
Pt c/o swelling: STAT is pt has developed SOB within 24 hours  1) How much weight have you gained and in what time span?   2) If swelling, where is the swelling located? Legs. Patient has not been able to bend his knees back  3) Are you currently taking a fluid pill? Yes. He takes 20 mg one day and 40 mg the next day to average out to 30 mg.   4) Are you currently SOB? Pt states he is always a little SOB  5) Do you have a log of your daily weights (if so, list)?   164 to 184 in the past month  6) Have you gained 3 pounds in a day or 5 pounds in a week?   7) Have you traveled recently?    The patient's dosage of the fluid pill was changed, but the patient is still retaining fluid. The fluid pill change may have had something to do with the patient having kidney trouble, but the patient is not pulling the fluid off.   Patient also has a handicapped adult son that lives with him. He has used his son's compression socks. He has noticed that they help, but they are too tight.

## 2019-03-05 NOTE — Progress Notes (Signed)
Cardiology Office Note    Date:  03/06/2019   ID:  [name]., DOB 1936-01-19, MRN [id number]  PCP:  Shirline Frees, MD  Cardiologist:  Dr. Angelena Form  Chief Complaint: weight gain and edema.   History of Present Illness:   [name]. is a 83 y.o. male with history of atrial fibrillation, ischemic cardiomyopathy s/p ICD placement, chronic systolic CHF, DM, HTN, HLD, CAD s/p 4V CABG in 2007 ((LIMA to LAD, SVG to OM, SVG sequential to PLA/PDA), recurrent R pleural effusion and carotid artery disease (followed by [facility])  seen for weight gain and edema.   Last cath 05/2014 howed severe native vessel CAD and 4 patent bypass grafts. He was noted to be in atrial fibrillation the day of the cardiac cath and was started on Eliquis. Echo 01/17/17 with LVEF=25%, diffuse hypokinesis of the septal and apical walls. The LV cavity is severely dilated. Severe MAC. Bilateral atrial enlargement. PA pressure 56 mmHg. He was referred to Dr. Lennie Odor in the EP clinic and an ICD was placed in March 2019. He was incidentally found to have a right pleural effusion at the time of his ICD placement and had thoracentesis May 2019. He underwent repeat thoracentesis in August 2020. He has since followed up with Dr. Vaughan Browner. Last Echo 10/29/18 with LVEF=20-25%. No significant valve disease.   Patient is here for weight gain and edema.  He has prior history of elevated renal function on higher dose of Lasix.  He started taking Lasix 40 mg daily from 20/40 alternate days since past few days.  Weight has been down 2 pounds.  However still a  8 to 10 pound above his baseline weight.  Unsure if his lower extremity edema is going down.  He denies any shortness of breath, orthopnea, PND, syncope, dizziness or melena.  He reports his urine output has been down.  Denies abdominal tightness.  He may be eating salt high in diet.  Past Medical History:  Diagnosis Date  . ANEMIA, HX OF   . Bronchial pneumonia Jan. 2016  .  CAD, ARTERY BYPASS GRAFT   . CAROTID ARTERY STENOSIS   . Cataract   . DIABETES MELLITUS, TYPE II   . Diabetic retinal damage of right eye ([place]) 2015  . Diverticulosis   . HYPERCHOLESTEROLEMIA   . HYPERTENSION   . Myocardial infarction [facility]) June 2007  . New onset atrial fibrillation ([place]) 06/24/2014  . PULMONARY HYPERTENSION   . RENAL INSUFFICIENCY, CHRONIC   . UNSPECIFIED THROMBOCYTOPENIA     Past Surgical History:  Procedure Laterality Date  . CARDIAC CATHETERIZATION  06/24/2014  . CATARACT EXTRACTION  2012   right eye  . COLONOSCOPY    . CORONARY ARTERY BYPASS GRAFT      quad bypass/2007  . EYE SURGERY    . ICD IMPLANT [name] 06/11/2017   Procedure: ICD IMPLANT;  Surgeon: Constance Haw, MD;  Location: [facility];  Service: Cardiovascular;  Laterality: [name];  . IR THORACENTESIS ASP PLEURAL SPACE W/IMG GUIDE  10/28/2018  . LEFT HEART CATHETERIZATION WITH CORONARY/GRAFT ANGIOGRAM [name] 06/24/2014   Procedure: LEFT HEART CATHETERIZATION WITH Beatrix Fetters;  Surgeon: Burnell Blanks, MD;  Location: [facility] CATH LAB;  Service: Cardiovascular;  Laterality: [name];  . POLYPECTOMY    . TONSILLECTOMY      Current Medications: Prior to Admission medications   Medication Sig Start Date End Date Taking? Authorizing Provider  apixaban (ELIQUIS) 2.5 MG TABS tablet Take 1 tablet (  2.5 mg total) by mouth 2 (two) times daily. 09/23/18   Burnell Blanks, MD  aspirin 81 MG tablet Take 81 mg by mouth daily.      [provider]  calcium-vitamin D (OSCAL WITH D) 500-200 MG-UNIT per tablet Take 1 tablet by mouth daily.     [provider]  Cholecalciferol (VITAMIN D3) 2000 UNITS TABS Take 2,000 Units by mouth daily.     [provider]  Coenzyme Q10 (CO Q 10 PO) Take 300 mg by mouth daily.    [provider]  fish oil-omega-3 fatty acids 1000 MG capsule Take 1 g by mouth daily.      [provider]  furosemide (LASIX) 20 MG tablet  Take 1 tablet (20 mg) every other day. Alternate with 40 mg (2 tablets) every other day. 12/18/18   Burnell Blanks, MD  glipiZIDE (GLUCOTROL) 10 MG tablet Take 10 mg by mouth 2 (two) times daily before a meal.    [provider]  metFORMIN (GLUCOPHAGE) 500 MG tablet Take 2 tablets (1,000 mg total) by mouth 2 (two) times daily with a meal. 06/27/14   Kilroy, Doreene Burke, PA-C  metoprolol succinate (TOPROL-XL) 25 MG 24 hr tablet Take 1 tablet (25 mg total) by mouth daily. 08/13/14   Burnell Blanks, MD  Multiple Vitamin (MULTI-DAY VITAMINS PO) Take 1 tablet by mouth daily.     [provider]  omeprazole (PRILOSEC) 20 MG capsule Take 20 mg by mouth 2 (two) times daily before a meal.  11/15/10   [provider]  sacubitril-valsartan (ENTRESTO) 24-26 MG Take 1 tablet by mouth 2 (two) times daily. 01/20/19   Burnell Blanks, MD  simvastatin (ZOCOR) 20 MG tablet Take 20 mg by mouth daily at 6 PM.  10/12/10   [provider]  Turmeric 450 MG CAPS Take 450 mg by mouth daily.     [provider]  vitamin C (ASCORBIC ACID) 500 MG tablet Take 500 mg by mouth daily.     [provider]  vitamin E 1000 UNIT capsule Take 1,000 Units by mouth every other day.    [provider]    Allergies:   Patient has no known allergies.   Social History   Socioeconomic History  . Marital status: Married    Spouse name: Not on file  . Number of children: 2  . Years of education: Not on file  . Highest education level: Not on file  Occupational History  . Occupation: Horticulturist, commercial: RETIRED  Tobacco Use  . Smoking status: Former Smoker    Types: Cigarettes    Quit date: 03/26/1970    Years since quitting: 48.9  . Smokeless tobacco: Never Used  Substance and Sexual Activity  . Alcohol use: Yes    Alcohol/week: 1.0 standard drinks    Types: 1 Glasses of wine per week  . Drug use: No  . Sexual activity: Not on file  Other  Topics Concern  . Not on file  Social History Narrative  . Not on file   Social Determinants of Health   Financial Resource Strain:   . Difficulty of Paying Living Expenses: Not on file  Food Insecurity:   . Worried About Charity fundraiser in the Last Year: Not on file  . Ran Out of Food in the Last Year: Not on file  Transportation Needs:   . Lack of Transportation (Medical): Not on file  . Lack  of Transportation (Non-Medical): Not on file  Physical Activity:   . Days of Exercise per Week: Not on file  . Minutes of Exercise per Session: Not on file  Stress:   . Feeling of Stress : Not on file  Social Connections:   . Frequency of Communication with Friends and Family: Not on file  . Frequency of Social Gatherings with Friends and Family: Not on file  . Attends Religious Services: Not on file  . Active Member of Clubs or Organizations: Not on file  . Attends Archivist Meetings: Not on file  . Marital Status: Not on file     Family History:  The patient's family history includes Colon cancer in his maternal aunt and maternal uncle; Diverticulitis in an other family member; Heart attack in his father and another family member; Heart disease (age of onset: 53) in his father; Hyperlipidemia in his father; Hypertension in his father; Stroke in his father; Varicose Veins in his father.   ROS:   Please see the history of present illness.    ROS All other systems reviewed and are negative.   PHYSICAL EXAM:   VS:  BP 128/74   Pulse 84   Ht _0  (1.803 m)   Wt 187 lb 12.8 oz (85.2 kg)   BMI 26.19 kg/m    GEN: Well nourished, well developed, in no acute distress  HEENT: normal  Neck: no , carotid bruits, or masses, positive JVD Cardiac: Regular; no murmurs, rubs, or gallops, 1-2+ bilateral lower extremity edema up to thigh Respiratory: Diminished breath sound on right basis without rales GI: soft, nontender, nondistended, + BS MS: no deformity or atrophy  Skin:  warm and dry, no rash Neuro:  Alert and Oriented x 3, Strength and sensation are intact Psych: euthymic mood, full affect  Wt Readings from Last 3 Encounters:  03/06/19 187 lb 12.8 oz (85.2 kg)  12/17/18 178 lb (80.7 kg)  11/05/18 173 lb 3.2 oz (78.6 kg)      Studies/Labs Reviewed:   EKG:  EKG is not  ordered today.  Recent Labs: 10/14/2018: ALT 8 10/28/2018: Hemoglobin 11.7; Platelets 308 03/06/2019: BUN 45; Creatinine, Ser 1.78; NT-Pro BNP 12,634; Potassium 4.5; Sodium 138   Lipid Panel No results found for: CHOL, TRIG, HDL, CHOLHDL, VLDL, LDLCALC, LDLDIRECT  Additional studies/ records that were reviewed today include:   Echocardiogram: 10/2018  1. The left ventricle has severely reduced systolic function, with an ejection fraction of 20-25%. The cavity size was normal. Left ventricular diastolic function could not be evaluated secondary to atrial fibrillation.  2. The right ventricle has moderately reduced systolic function. The cavity was mildly enlarged. There is no increase in right ventricular wall thickness.  3. Left atrial size was severely dilated.  4. Right atrial size was mildly dilated.  5. Mildly thickened tricuspid valve leaflets.  6. The aortic valve is grossly normal. No stenosis of the aortic valve. Mild to moderate aortic annular calcification noted.  7. The mitral valve is grossly normal. Mild thickening of the mitral valve leaflet. Mild calcification of the mitral valve leaflet.  8. The tricuspid valve is grossly normal.  9. The aorta is normal in size and structure. 10. The aortic root and ascending aorta are normal in size and structure. 11. The inferior vena cava was dilated in size with <50% respiratory variability. 12. The interatrial septum was not assessed.  Cardiac Catheterization: 05/2014 Hemodynamic Findings: Central aortic pressure: 124/62 Left ventricular pressure: 130/3/9  Angiographic  Findings:  Left main: 30% mid stenosis.   Left  Anterior Descending Artery: Large caliber diffusely diseased vessel that courses to the apex. The proximal vessel has a 95% stenosis. There is a small caliber diagonal branch that is supplied by the proximal LAD.The mid LAD is occluded. The mid and distal LAD fills from the patent IMA graft. There is a moderate caliber septal perforating branch.   Circumflex Artery: Moderate caliber vessel with proximal 80% stenosis, 100% mid occlusion. The obtuse marginal branch is occluded and fills from the patent vein graft.   Right Coronary Artery: Large dominant vessel with diffuse 30% proximal stenosis, 95% mid stenosis, diffuse 30% distal stenosis. There is diffuse 90% distal stenosis just before the bifurcation. The PLA and PDA fill antegrade and from the patent sequential vein grafts.   Graft Anatomy:  SVG to OM is patent. There are two valves in the body of the vein graft and the appearance of this graft is unchanged from last cath in 2010.  SVG sequential to PLA/PDA is patent LIMA to mid LAD is patent. The target LAD vessel is small and diffusely diseased.   Left Ventricular Angiogram: Deferred.   Impression: 1. Severe triple vessel CAD s/p 4V CABG with 4/4 patent bypass grafts 2. Ischemic cardiomyopathy 3. New onset atrial fibrillation-not recognized before today (EKG on 06/07/14 showed sinus) 4. Dyspnea and dizziness likely related to atrial fibrillation  Recommendations: Will continue ASA, beta blocker and ARB. His atrial fibrillation is not rate controlled. This is a new diagnosis. Will admit to observation in telemetry and follow on cardiac monitor today. Will increase Toprol to 50 mg daily for better rate control and adjust as needed over the next 24 hours. I have had a long discussion with the patient and his wife regarding long term anti-coagulation. CHADS-VASC score is 7 indicating 11.2% chance per year of CVA. Will start heparin IV 8 hours post sheath pull. If his groin is stable in the  am and renal function is stable post cath, will start Eliquis 5 mg po BID. My hope is that his LVEF will improve with rate control. If stable in am, could be discharged home and have close f/u with me or office APP in next 7-10 days. If he remains in atrial fibrillation, will consider DCCV after one month of anti-coagulation.        Complications:  None. The patient tolerated the procedure well.        ASSESSMENT & PLAN:    1. Acute on chronic systolic heart failure Approximately 8 to 10 pounds above his baseline weight ~178lb.  May be eating high salt diet.  I have encouraged low-sodium diet with leg elevation and compression stocking.  He has not noted much difference since taking Lasix 40 mg daily but lost 2 pounds on home scale.  His urine output has been down.  I am concerned about worsening renal function or not absorbing Lasix well.  I will check stat BNP and BMP and make decision based on labs.  2. Recurrent R pleural effusion - S/p thoracentesis x 2 - Followed by Dr. Vaughan Browner -He denies shortness of breath, orthopnea or PND  3.  CAD -No angina.  No change in therapy  5. Atrial fibrillation  -Continue Eliquis for rate control therapy.  Medication Adjustments/Labs and Tests Ordered: Current medicines are reviewed at length with the patient today.  Concerns regarding medicines are outlined above.  Medication changes, Labs and Tests ordered today are listed in the Patient  Instructions below. Patient Instructions  Medication Instructions:  CONTINUE ON CURRENT MEDICATIONS *If you need a refill on your cardiac medications before your next appointment, please call your pharmacy*  Lab Work: TODAY STAT BMET, PRO BNP If you have labs (blood work) drawn today and your tests are completely normal, you will receive your results only by: [name] MyChart Message (if you have MyChart) OR . A paper copy in the mail If you have any lab test that is abnormal or we need to change your treatment, we will  call you to review the results.  Testing/Procedures: NONE ORDERED TODAY  Follow-Up: At [facility], you and your health needs are our priority.  As part of our continuing mission to provide you with exceptional heart care, we have created designated Provider Care Teams.  These Care Teams include your primary Cardiologist (physician) and Advanced Practice Providers (APPs -  Physician Assistants and Nurse Practitioners) who all work together to provide you with the care you need, when you need it.  Your next appointment:     The format for your next appointment:     Provider:     Other Instructions [place] WITH YOUR LAB RESULTS AND ANY INSTRUCTIONS PER VIN [place], [place]     Signed, Leanor Kail, PA  03/06/2019 1:53 PM    [facility] [address], [place], [place]  [id number] Phone: [phone]; Fax: [phone]

## 2019-03-06 ENCOUNTER — Ambulatory Visit (INDEPENDENT_AMBULATORY_CARE_PROVIDER_SITE_OTHER): Payer: PPO | Admitting: Physician Assistant

## 2019-03-06 ENCOUNTER — Other Ambulatory Visit: Payer: Self-pay

## 2019-03-06 ENCOUNTER — Telehealth: Payer: Self-pay | Admitting: *Deleted

## 2019-03-06 ENCOUNTER — Encounter: Payer: Self-pay | Admitting: Physician Assistant

## 2019-03-06 VITALS — BP 128/74 | HR 84 | Ht 71.0 in | Wt 187.8 lb

## 2019-03-06 DIAGNOSIS — I4891 Unspecified atrial fibrillation: Secondary | ICD-10-CM

## 2019-03-06 DIAGNOSIS — N183 Chronic kidney disease, stage 3 unspecified: Secondary | ICD-10-CM | POA: Diagnosis not present

## 2019-03-06 DIAGNOSIS — I5023 Acute on chronic systolic (congestive) heart failure: Secondary | ICD-10-CM

## 2019-03-06 DIAGNOSIS — I10 Essential (primary) hypertension: Secondary | ICD-10-CM

## 2019-03-06 DIAGNOSIS — I255 Ischemic cardiomyopathy: Secondary | ICD-10-CM | POA: Diagnosis not present

## 2019-03-06 LAB — BASIC METABOLIC PANEL
BUN/Creatinine Ratio: 25 — ABNORMAL HIGH (ref 10–24)
BUN: 45 mg/dL — ABNORMAL HIGH (ref 8–27)
CO2: 26 mmol/L (ref 20–29)
Calcium: 9.6 mg/dL (ref 8.6–10.2)
Chloride: 99 mmol/L (ref 96–106)
Creatinine, Ser: 1.78 mg/dL — ABNORMAL HIGH (ref 0.76–1.27)
GFR calc Af Amer: 40 mL/min/{1.73_m2} — ABNORMAL LOW (ref >59)
GFR calc non Af Amer: 35 mL/min/{1.73_m2} — ABNORMAL LOW (ref >59)
Glucose: 120 mg/dL — ABNORMAL HIGH (ref 65–99)
Potassium: 4.5 mmol/L (ref 3.5–5.2)
Sodium: 138 mmol/L (ref 134–144)

## 2019-03-06 LAB — PRO B NATRIURETIC PEPTIDE: NT-Pro BNP: 12634 pg/mL — ABNORMAL HIGH (ref 0–486)

## 2019-03-06 NOTE — Telephone Encounter (Signed)
Received call from [name] at [facility]. She states earlier today BMP results were not available when BNP results were called. Another BMP was then run. These results as well as first BMP from today are now both in chart.

## 2019-03-06 NOTE — Telephone Encounter (Signed)
Pt has been notified of lab results and recommendations. Pt is agreeable to plan of care to increase lasix to 60 mg BID, repeat lab work to be done 03/12/19, bmet, pro bnp. Referral to HF Clinic to be seen in 2 weeks. Pt aware HF Clinic will call him with an appt. Pt thanked me for the call and the help. Patient notified of result. Please refer to phone note from today for complete details.  Julaine Hua, [place] 03/06/2019 3:02 PM

## 2019-03-06 NOTE — Patient Instructions (Signed)
Medication Instructions:  CONTINUE ON CURRENT MEDICATIONS *If you need a refill on your cardiac medications before your next appointment, please call your pharmacy*  Lab Work: TODAY STAT BMET, PRO BNP If you have labs (blood work) drawn today and your tests are completely normal, you will receive your results only by: [name] MyChart Message (if you have MyChart) OR . A paper copy in the mail If you have any lab test that is abnormal or we need to change your treatment, we will call you to review the results.  Testing/Procedures: NONE ORDERED TODAY  Follow-Up: At [facility], you and your health needs are our priority.  As part of our continuing mission to provide you with exceptional heart care, we have created designated Provider Care Teams.  These Care Teams include your primary Cardiologist (physician) and Advanced Practice Providers (APPs -  Physician Assistants and Nurse Practitioners) who all work together to provide you with the care you need, when you need it.  Your next appointment:     The format for your next appointment:     Provider:     Other Instructions [place] WITH YOUR LAB RESULTS AND ANY INSTRUCTIONS PER VIN BHAGAT, PAC

## 2019-03-06 NOTE — Addendum Note (Signed)
Addended by: Michae Kava on: 03/06/2019 03:04 PM   Modules accepted: Orders

## 2019-03-06 NOTE — Telephone Encounter (Signed)
-----   Message from [place], [place] sent at 03/06/2019  2:05 PM EST ----- Discussed with Dr. Angelena Form increase lasix to 60mg  BID. BMET and BNP in 7 days. Follow up in CHF clinic 2 weeks.   ----- Message ----- From: Interface, [facility] Lab Results In Sent: 03/06/2019   1:37 PM EST To: Leanor Kail, [place]

## 2019-03-12 ENCOUNTER — Other Ambulatory Visit: Payer: Self-pay

## 2019-03-12 ENCOUNTER — Other Ambulatory Visit: Payer: PPO | Admitting: *Deleted

## 2019-03-12 ENCOUNTER — Telehealth: Payer: Self-pay | Admitting: Cardiovascular Disease

## 2019-03-12 DIAGNOSIS — I5023 Acute on chronic systolic (congestive) heart failure: Secondary | ICD-10-CM | POA: Diagnosis not present

## 2019-03-12 NOTE — Progress Notes (Signed)
Need order for BMET and BNP.

## 2019-03-12 NOTE — Telephone Encounter (Signed)
New Message     [name] is calling from [facility] and says the pharmacist would like to start the pt on RPM Program, a scale to monitor pts wt  She says the pt told her he would be starting a program with Cardiology and she is wondering if this would interfere    Please call

## 2019-03-13 DIAGNOSIS — I2510 Atherosclerotic heart disease of native coronary artery without angina pectoris: Secondary | ICD-10-CM | POA: Diagnosis not present

## 2019-03-13 DIAGNOSIS — E782 Mixed hyperlipidemia: Secondary | ICD-10-CM | POA: Diagnosis not present

## 2019-03-13 DIAGNOSIS — I10 Essential (primary) hypertension: Secondary | ICD-10-CM | POA: Diagnosis not present

## 2019-03-13 DIAGNOSIS — N183 Chronic kidney disease, stage 3 unspecified: Secondary | ICD-10-CM | POA: Diagnosis not present

## 2019-03-13 DIAGNOSIS — I129 Hypertensive chronic kidney disease with stage 1 through stage 4 chronic kidney disease, or unspecified chronic kidney disease: Secondary | ICD-10-CM | POA: Diagnosis not present

## 2019-03-13 DIAGNOSIS — E1122 Type 2 diabetes mellitus with diabetic chronic kidney disease: Secondary | ICD-10-CM | POA: Diagnosis not present

## 2019-03-13 DIAGNOSIS — I480 Paroxysmal atrial fibrillation: Secondary | ICD-10-CM | POA: Diagnosis not present

## 2019-03-13 DIAGNOSIS — D649 Anemia, unspecified: Secondary | ICD-10-CM | POA: Diagnosis not present

## 2019-03-13 LAB — BASIC METABOLIC PANEL
BUN/Creatinine Ratio: 30 — ABNORMAL HIGH (ref 10–24)
BUN: 56 mg/dL — ABNORMAL HIGH (ref 8–27)
CO2: 26 mmol/L (ref 20–29)
Calcium: 9.6 mg/dL (ref 8.6–10.2)
Chloride: 94 mmol/L — ABNORMAL LOW (ref 96–106)
Creatinine, Ser: 1.84 mg/dL — ABNORMAL HIGH (ref 0.76–1.27)
GFR calc Af Amer: 39 mL/min/{1.73_m2} — ABNORMAL LOW (ref >59)
GFR calc non Af Amer: 34 mL/min/{1.73_m2} — ABNORMAL LOW (ref >59)
Glucose: 180 mg/dL — ABNORMAL HIGH (ref 65–99)
Potassium: 4.5 mmol/L (ref 3.5–5.2)
Sodium: 138 mmol/L (ref 134–144)

## 2019-03-13 LAB — PRO B NATRIURETIC PEPTIDE: NT-Pro BNP: 7736 pg/mL — ABNORMAL HIGH (ref 0–486)

## 2019-03-13 NOTE — Telephone Encounter (Signed)
Adv that patient is being referred to [facility] by APP but otherwise I do not see any programs he is enrolled in.

## 2019-03-17 ENCOUNTER — Ambulatory Visit (INDEPENDENT_AMBULATORY_CARE_PROVIDER_SITE_OTHER): Payer: PPO | Admitting: *Deleted

## 2019-03-17 DIAGNOSIS — I255 Ischemic cardiomyopathy: Secondary | ICD-10-CM | POA: Diagnosis not present

## 2019-03-17 LAB — CUP PACEART REMOTE DEVICE CHECK
Battery Remaining Longevity: 85 mo
Battery Remaining Percentage: 85 %
Battery Voltage: 3.01 V
Brady Statistic RV Percent Paced: 1 %
Date Time Interrogation Session: 20201221020017
HighPow Impedance: 62 Ohm
HighPow Impedance: 62 Ohm
Implantable Lead Implant Date: 20190319
Implantable Lead Location: 753860
Implantable Pulse Generator Implant Date: 20190319
Lead Channel Impedance Value: 360 Ohm
Lead Channel Pacing Threshold Amplitude: 0.5 V
Lead Channel Pacing Threshold Pulse Width: 0.5 ms
Lead Channel Sensing Intrinsic Amplitude: 9.7 mV
Lead Channel Setting Pacing Amplitude: 2.5 V
Lead Channel Setting Pacing Pulse Width: 0.5 ms
Lead Channel Setting Sensing Sensitivity: 0.5 mV
Pulse Gen Serial Number: 9811279

## 2019-03-31 ENCOUNTER — Other Ambulatory Visit: Payer: Self-pay

## 2019-03-31 MED ORDER — APIXABAN 2.5 MG PO TABS: 2.5000 mg | ORAL_TABLET | Freq: Two times a day (BID) | ORAL | 1 refills | Status: DC

## 2019-03-31 NOTE — Telephone Encounter (Signed)
Pt last saw Robbie Lis, PA on 03/06/19, last labs 03/12/19 Creat 1.84, age 84, weight 85.2kg, based on specified criteria pt is on appropriate dosage of Eliquis 2.5mg  BID.  Will refill rx.

## 2019-04-14 DIAGNOSIS — I10 Essential (primary) hypertension: Secondary | ICD-10-CM | POA: Diagnosis not present

## 2019-04-15 LAB — BASIC METABOLIC PANEL
BUN/Creatinine Ratio: 22 (ref 10–24)
BUN: 45 mg/dL — ABNORMAL HIGH (ref 8–27)
CO2: 24 mmol/L (ref 20–29)
Calcium: 9.7 mg/dL (ref 8.6–10.2)
Chloride: 98 mmol/L (ref 96–106)
Creatinine, Ser: 2.01 mg/dL — ABNORMAL HIGH (ref 0.76–1.27)
GFR calc Af Amer: 35 mL/min/{1.73_m2} — ABNORMAL LOW (ref >59)
GFR calc non Af Amer: 30 mL/min/{1.73_m2} — ABNORMAL LOW (ref >59)
Glucose: 115 mg/dL — ABNORMAL HIGH (ref 65–99)
Potassium: 4.5 mmol/L (ref 3.5–5.2)
Sodium: 138 mmol/L (ref 134–144)

## 2019-04-15 LAB — SPECIMEN STATUS REPORT

## 2019-04-21 DIAGNOSIS — I129 Hypertensive chronic kidney disease with stage 1 through stage 4 chronic kidney disease, or unspecified chronic kidney disease: Secondary | ICD-10-CM | POA: Diagnosis not present

## 2019-04-21 DIAGNOSIS — Z0000 Encounter for general adult medical examination without abnormal findings: Secondary | ICD-10-CM | POA: Diagnosis not present

## 2019-04-21 DIAGNOSIS — I480 Paroxysmal atrial fibrillation: Secondary | ICD-10-CM | POA: Diagnosis not present

## 2019-04-21 DIAGNOSIS — R609 Edema, unspecified: Secondary | ICD-10-CM | POA: Diagnosis not present

## 2019-04-21 DIAGNOSIS — I429 Cardiomyopathy, unspecified: Secondary | ICD-10-CM | POA: Diagnosis not present

## 2019-04-21 DIAGNOSIS — I10 Essential (primary) hypertension: Secondary | ICD-10-CM | POA: Diagnosis not present

## 2019-04-21 DIAGNOSIS — K219 Gastro-esophageal reflux disease without esophagitis: Secondary | ICD-10-CM | POA: Diagnosis not present

## 2019-04-21 DIAGNOSIS — E1122 Type 2 diabetes mellitus with diabetic chronic kidney disease: Secondary | ICD-10-CM | POA: Diagnosis not present

## 2019-04-21 DIAGNOSIS — E782 Mixed hyperlipidemia: Secondary | ICD-10-CM | POA: Diagnosis not present

## 2019-04-22 DIAGNOSIS — E782 Mixed hyperlipidemia: Secondary | ICD-10-CM | POA: Diagnosis not present

## 2019-04-22 DIAGNOSIS — E1122 Type 2 diabetes mellitus with diabetic chronic kidney disease: Secondary | ICD-10-CM | POA: Diagnosis not present

## 2019-04-24 DIAGNOSIS — N183 Chronic kidney disease, stage 3 unspecified: Secondary | ICD-10-CM | POA: Diagnosis not present

## 2019-04-24 DIAGNOSIS — I129 Hypertensive chronic kidney disease with stage 1 through stage 4 chronic kidney disease, or unspecified chronic kidney disease: Secondary | ICD-10-CM | POA: Diagnosis not present

## 2019-04-24 DIAGNOSIS — I2510 Atherosclerotic heart disease of native coronary artery without angina pectoris: Secondary | ICD-10-CM | POA: Diagnosis not present

## 2019-04-24 DIAGNOSIS — D649 Anemia, unspecified: Secondary | ICD-10-CM | POA: Diagnosis not present

## 2019-04-24 DIAGNOSIS — E1122 Type 2 diabetes mellitus with diabetic chronic kidney disease: Secondary | ICD-10-CM | POA: Diagnosis not present

## 2019-04-24 DIAGNOSIS — I480 Paroxysmal atrial fibrillation: Secondary | ICD-10-CM | POA: Diagnosis not present

## 2019-04-24 DIAGNOSIS — I10 Essential (primary) hypertension: Secondary | ICD-10-CM | POA: Diagnosis not present

## 2019-04-24 DIAGNOSIS — E782 Mixed hyperlipidemia: Secondary | ICD-10-CM | POA: Diagnosis not present

## 2019-04-27 ENCOUNTER — Telehealth (HOSPITAL_COMMUNITY): Payer: Self-pay

## 2019-04-27 NOTE — Telephone Encounter (Signed)

## 2019-04-28 ENCOUNTER — Ambulatory Visit (HOSPITAL_COMMUNITY)
Admission: RE | Admit: 2019-04-28 | Discharge: 2019-04-28 | Disposition: A | Discharge status: Home / Self Care | Payer: PPO | Source: Ambulatory Visit | Attending: Cardiology | Admitting: Cardiology

## 2019-04-28 ENCOUNTER — Encounter (HOSPITAL_COMMUNITY): Payer: Self-pay | Admitting: Cardiology

## 2019-04-28 ENCOUNTER — Other Ambulatory Visit: Payer: Self-pay

## 2019-04-28 VITALS — BP 98/52 | HR 87 | Wt 184.0 lb

## 2019-04-28 DIAGNOSIS — N183 Chronic kidney disease, stage 3 unspecified: Secondary | ICD-10-CM | POA: Insufficient documentation

## 2019-04-28 DIAGNOSIS — I2510 Atherosclerotic heart disease of native coronary artery without angina pectoris: Secondary | ICD-10-CM | POA: Diagnosis not present

## 2019-04-28 DIAGNOSIS — Z7984 Long term (current) use of oral hypoglycemic drugs: Secondary | ICD-10-CM | POA: Insufficient documentation

## 2019-04-28 DIAGNOSIS — I5022 Chronic systolic (congestive) heart failure: Secondary | ICD-10-CM | POA: Insufficient documentation

## 2019-04-28 DIAGNOSIS — I6521 Occlusion and stenosis of right carotid artery: Secondary | ICD-10-CM | POA: Diagnosis not present

## 2019-04-28 DIAGNOSIS — E785 Hyperlipidemia, unspecified: Secondary | ICD-10-CM | POA: Diagnosis not present

## 2019-04-28 DIAGNOSIS — I4891 Unspecified atrial fibrillation: Secondary | ICD-10-CM

## 2019-04-28 DIAGNOSIS — R0602 Shortness of breath: Secondary | ICD-10-CM | POA: Diagnosis not present

## 2019-04-28 DIAGNOSIS — J90 Pleural effusion, not elsewhere classified: Secondary | ICD-10-CM | POA: Insufficient documentation

## 2019-04-28 DIAGNOSIS — Z951 Presence of aortocoronary bypass graft: Secondary | ICD-10-CM | POA: Insufficient documentation

## 2019-04-28 DIAGNOSIS — Z87891 Personal history of nicotine dependence: Secondary | ICD-10-CM | POA: Insufficient documentation

## 2019-04-28 DIAGNOSIS — I4819 Other persistent atrial fibrillation: Secondary | ICD-10-CM | POA: Diagnosis not present

## 2019-04-28 DIAGNOSIS — I130 Hypertensive heart and chronic kidney disease with heart failure and stage 1 through stage 4 chronic kidney disease, or unspecified chronic kidney disease: Secondary | ICD-10-CM | POA: Diagnosis not present

## 2019-04-28 DIAGNOSIS — Z79899 Other long term (current) drug therapy: Secondary | ICD-10-CM | POA: Diagnosis not present

## 2019-04-28 DIAGNOSIS — Z8249 Family history of ischemic heart disease and other diseases of the circulatory system: Secondary | ICD-10-CM | POA: Diagnosis not present

## 2019-04-28 DIAGNOSIS — E1122 Type 2 diabetes mellitus with diabetic chronic kidney disease: Secondary | ICD-10-CM | POA: Insufficient documentation

## 2019-04-28 DIAGNOSIS — Z7901 Long term (current) use of anticoagulants: Secondary | ICD-10-CM | POA: Diagnosis not present

## 2019-04-28 DIAGNOSIS — I255 Ischemic cardiomyopathy: Secondary | ICD-10-CM | POA: Insufficient documentation

## 2019-04-28 DIAGNOSIS — I272 Pulmonary hypertension, unspecified: Secondary | ICD-10-CM

## 2019-04-28 LAB — BASIC METABOLIC PANEL
Anion gap: 16 — ABNORMAL HIGH (ref 5–15)
BUN: 78 mg/dL — ABNORMAL HIGH (ref 8–23)
CO2: 24 mmol/L (ref 22–32)
Calcium: 9.3 mg/dL (ref 8.9–10.3)
Chloride: 96 mmol/L — ABNORMAL LOW (ref 98–111)
Creatinine, Ser: 2.55 mg/dL — ABNORMAL HIGH (ref 0.61–1.24)
GFR calc Af Amer: 26 mL/min — ABNORMAL LOW (ref >60)
GFR calc non Af Amer: 23 mL/min — ABNORMAL LOW (ref >60)
Glucose, Bld: 199 mg/dL — ABNORMAL HIGH (ref 70–99)
Potassium: 4.8 mmol/L (ref 3.5–5.1)
Sodium: 136 mmol/L (ref 135–145)

## 2019-04-28 LAB — CBC
HCT: 30.6 % — ABNORMAL LOW (ref 39.0–52.0)
Hemoglobin: 9.8 g/dL — ABNORMAL LOW (ref 13.0–17.0)
MCH: 29 pg (ref 26.0–34.0)
MCHC: 32 g/dL (ref 30.0–36.0)
MCV: 90.5 fL (ref 80.0–100.0)
Platelets: 245 10*3/uL (ref 150–400)
RBC: 3.38 MIL/uL — ABNORMAL LOW (ref 4.22–5.81)
RDW: 16.9 % — ABNORMAL HIGH (ref 11.5–15.5)
WBC: 9.1 10*3/uL (ref 4.0–10.5)
nRBC: 0 % (ref 0.0–0.2)

## 2019-04-28 MED ORDER — FUROSEMIDE 80 MG PO TABS: 80.0000 mg | ORAL_TABLET | Freq: Two times a day (BID) | ORAL | 3 refills | Status: DC

## 2019-04-28 MED ORDER — AMIODARONE HCL 200 MG PO TABS: ORAL_TABLET | ORAL | 4 refills | Status: DC

## 2019-04-28 MED ORDER — SPIRONOLACTONE 25 MG PO TABS: 12.5000 mg | ORAL_TABLET | Freq: Every day | ORAL | 3 refills | Status: DC

## 2019-04-28 NOTE — Patient Instructions (Addendum)
STOP Aspirin   TAKE Amiodarone 200mg  (1 tab) twice a day for 2 weeks THEN 1 tab daily after that.   INCREASE Lasix to 80mg  (1 tab) twice a day   START Spironolactone 12.5mg  (1/2 tab) daily   You were ordered for chest xray.  Our office will call you with the results   Labs today and repeat in 10 days We will only contact you if something comes back abnormal or we need to make some changes. Otherwise no news is good news!   Your physician recommends that you schedule a follow-up appointment in: 3 weeks with Dr Aundra Dubin.   Dear [name],  You are scheduled for a Cardioversion on Monday February 15th, 2021 with Dr. Aundra Dubin.  Please arrive at the [facility] (Main Entrance A) at [facility]: [address] [place], [place] [address] at 8:30AM. (1 hour prior to procedure unless lab work is needed; if lab work is needed arrive 1.5 hours ahead)  DIET: Nothing to eat or drink after midnight except a sip of water with medications (see medication instructions below)  Medication Instructions: Hold ALL oral medications on the morning of your procedure except for Eliquis  Continue your anticoagulant: Eliquis You will need to continue your anticoagulant after your procedure until  you are told by your  Provider that it is safe to stop   Labs: today in office  You will need a pre procedure COVID test    WHEN: February 12th, 2021 at 1:20p WHERE: [facility]        [place] [id number]  This is a drive thru testing site, you will remain in your car. Be sure to get in the line FOR PROCEDURES Once you have been swabbed you will need to remain home in quarantine until you return for your procedure.   You must have a responsible person to drive you home and stay in the waiting area during your procedure. Failure to do so could result in cancellation.  Bring your insurance cards.  *Special Note: Every effort is made to have your procedure done on  time. Occasionally there are emergencies that occur at the hospital that may cause delays. Please be patient if a delay does occur.

## 2019-04-29 NOTE — H&P (View-Only) (Signed)
PCP: Shirline Frees, MD Cardiology: Dr. Angelena Form  84 y.o. with h/o CAD s/p CABG, ischemic cardiomyopathy, and diabetes was referred by Dr. Angelena Form for evaluation of CHF.  Patient had CABG in 2007.  Last cath in 2016 showed all 4 grafts patent.  Echo was 25-30% in 2016.  Most recent echo in 8/20 showed EF 20-25% with moderate RV dysfunction.  He has had a recurrent right pleural effusion with thoracenteses in 5/19 and 8/20.  He thinks that he has been in atrial fibrillation persistently for about a year.  He used to take amiodarone but stopped it because he "felt bad."  However, he does not think that stopping amiodarone made him feel any better.  Creatinine has been elevated, CKD stage 3.   He has had more trouble with volume overload over the last few months.  Lasix increased in 12/20 to 60 mg bid.   He continues to have significant leg edema.  He does ok if he walks slowly.  He is short of breath with stairs/inclines.  He is short of breath carrying groceries.  No orthopnea/PND. No chest pain.  Minimal lightheadedness. BP runs low at times, 96/52 today.   ECG (personally reviewed): atrial fibrillation, LAFB, low voltage  Labs (12/20): K 4.5, creatinine 2.01 => 1.84  PMH: 1. Type 2 diabetes  2. HTN 3. Hyperlipidemia 4. CAD: CABG x 4 in 2007 with LIMA-LAD, SVG-OM, sequential SVG-PLV/PDA.   - LHC (2016): Patent grafts.  5. Atrial fibrillation: Persistent.  6. CKD stage 3.  7. Carotid stenosis:  - Carotid dopplers (8/20): 60-79% RICA stenosis.  8. Chronic systolic CHF: Ischemic cardiomyopathy.    - Echo (2016): EF 25-30% - Echo (2018): EF 25% - Echo (8/20): EF 20-25%, moderately decreased RV systolic function, severe LAE.  - St Jude ICD.  9. Pleural effusion on right: s/p thoracentesis in 5/19 and 8/20.   Social History   Socioeconomic History  . Marital status: Married    Spouse name: Not on file  . Number of children: 2  . Years of education: Not on file  . Highest education  level: Not on file  Occupational History  . Occupation: Horticulturist, commercial: RETIRED  Tobacco Use  . Smoking status: Former Smoker    Types: Cigarettes    Quit date: 03/26/1970    Years since quitting: 49.1  . Smokeless tobacco: Never Used  Substance and Sexual Activity  . Alcohol use: Yes    Alcohol/week: 1.0 standard drinks    Types: 1 Glasses of wine per week  . Drug use: No  . Sexual activity: Not on file  Other Topics Concern  . Not on file  Social History Narrative  . Not on file   Social Determinants of Health   Financial Resource Strain:   . Difficulty of Paying Living Expenses: Not on file  Food Insecurity:   . Worried About Charity fundraiser in the Last Year: Not on file  . Ran Out of Food in the Last Year: Not on file  Transportation Needs:   . Lack of Transportation (Medical): Not on file  . Lack of Transportation (Non-Medical): Not on file  Physical Activity:   . Days of Exercise per Week: Not on file  . Minutes of Exercise per Session: Not on file  Stress:   . Feeling of Stress : Not on file  Social Connections:   . Frequency of Communication with Friends and Family: Not on file  . Frequency of Social  Gatherings with Friends and Family: Not on file  . Attends Religious Services: Not on file  . Active Member of Clubs or Organizations: Not on file  . Attends Archivist Meetings: Not on file  . Marital Status: Not on file  Intimate Partner Violence:   . Fear of Current or Ex-Partner: Not on file  . Emotionally Abused: Not on file  . Physically Abused: Not on file  . Sexually Abused: Not on file   Family History  Problem Relation Age of Onset  . Heart disease Father 64       Heart disease before age 43  . Heart attack Father   . Hypertension Father   . Hyperlipidemia Father   . Varicose Veins Father   . Stroke Father        ? not sure  . Colon cancer Maternal Aunt   . Colon cancer Maternal Uncle   . Diverticulitis Other   . Heart  attack Other    ROS: All systems reviewed and negative except as per HPI.   Current Outpatient Medications  Medication Sig Dispense Refill  . apixaban (ELIQUIS) 2.5 MG TABS tablet Take 1 tablet (2.5 mg total) by mouth 2 (two) times daily. 180 tablet 1  . calcium-vitamin D (OSCAL WITH D) 500-200 MG-UNIT per tablet Take 1 tablet by mouth daily.     . Cholecalciferol (VITAMIN D3) 2000 UNITS TABS Take 2,000 Units by mouth daily.     . Coenzyme Q10 (CO Q 10 PO) Take 300 mg by mouth daily.    . fish oil-omega-3 fatty acids 1000 MG capsule Take 1 g by mouth daily.      . furosemide (LASIX) 80 MG tablet Take 1 tablet (80 mg total) by mouth 2 (two) times daily. 60 tablet 3  . gabapentin (NEURONTIN) 300 MG capsule Take 300 mg by mouth 2 (two) times daily as needed.    [name] glipiZIDE (GLUCOTROL) 10 MG tablet Take 10 mg by mouth 2 (two) times daily before a meal.    . metFORMIN (GLUCOPHAGE) 500 MG tablet Take 2 tablets (1,000 mg total) by mouth 2 (two) times daily with a meal.    . metoprolol succinate (TOPROL-XL) 25 MG 24 hr tablet Take 1 tablet (25 mg total) by mouth daily. 90 tablet 3  . Multiple Vitamin (MULTI-DAY VITAMINS PO) Take 1 tablet by mouth daily.     [name] omeprazole (PRILOSEC) 20 MG capsule Take 20 mg by mouth 2 (two) times daily before a meal.     . sacubitril-valsartan (ENTRESTO) 24-26 MG Take 1 tablet by mouth 2 (two) times daily. 180 tablet 3  . simvastatin (ZOCOR) 20 MG tablet Take 20 mg by mouth daily at 6 PM.     . Turmeric 450 MG CAPS Take 450 mg by mouth daily.     . vitamin C (ASCORBIC ACID) 500 MG tablet Take 500 mg by mouth daily.     . vitamin E 1000 UNIT capsule Take 1,000 Units by mouth every other day.    [name] amiodarone (PACERONE) 200 MG tablet Take 1 tab twice a day for 2 weeks then 1 tab daily after that 45 tablet 4  . spironolactone (ALDACTONE) 25 MG tablet Take 0.5 tablets (12.5 mg total) by mouth daily. 45 tablet 3   No current facility-administered medications for this  encounter.   BP (!) 98/52   Pulse 87   Wt 83.5 kg (184 lb)   SpO2 98%   BMI 25.66 kg/m  General: NAD Neck: JVP 10 cm, no thyromegaly or thyroid nodule.  Lungs: Decreased BS right base.  CV: Nondisplaced PMI.  Heart irregular S1/S2, no S3/S4, no murmur.  2+ edema to knees.  No carotid bruit.  Normal pedal pulses.  Abdomen: Soft, nontender, no hepatosplenomegaly, no distention.  Skin: Intact without lesions or rashes.  Neurologic: Alert and oriented x 3.  Psych: Normal affect. Extremities: No clubbing or cyanosis.  HEENT: Normal.    Assessment/Plan: 1. Chronic systolic CHF: Ischemic cardiomyopathy.  Echo in 8/20 with EF 20-25%, moderately decreased RV systolic function.  St Jude ICD.  NYHA class III symptoms.  He is significantly volume overloaded on exam.  This is complicated by CKD stage 3.   - Increase Lasix to 80 mg bid.  BMET today and in 10 days.  - Add spironolactone 12.5 mg daily.  - Wear graded compression stockings during the day.  - Continue Toprol XL 25 mg daily.  - Continue Entresto 24/26 bid. No BP room to increase.  - I think he would benefit from resumption of NSR (see below).  - Narrow QRS, not candidate for upgrade to CRT.  2. CAD: S/p CABG 2007, patent grafts on LHC in 2016. No exertional chest pain.  - Given stable CAD and Eliquis use, he can stop ASA.  - Continue statin.  3. Atrial fibrillation: Persistent.  Patient feels like he has been in atrial fibrillation for about a year.  I do not think that he will convert long-term without an anti-arrhythmic medication, and amiodarone is the only viable choice.   - Start amiodarone 200 mg bid x 2 wks then 200 mg daily.  - DCCV will be arranged in 2 wks.  He has not missed any Eliquis doses.  We discussed risks/benefits and he agrees to procedure.  4. Right chronic pleural effusion: Decreased BS at right base.  - CXR PA/lateral to reassess effusion.  5. CKD stage 3: Follow closely with diuresis.  6. Carotid stenosis:  Repeat dopplers in 8/21.    Followup in 3 wks.   [name] 04/29/2019

## 2019-04-29 NOTE — Progress Notes (Signed)
PCP: Shirline Frees, MD Cardiology: Dr. Angelena Form  84 y.o. with h/o CAD s/p CABG, ischemic cardiomyopathy, and diabetes was referred by Dr. Angelena Form for evaluation of CHF.  Patient had CABG in 2007.  Last cath in 2016 showed all 4 grafts patent.  Echo was 25-30% in 2016.  Most recent echo in 8/20 showed EF 20-25% with moderate RV dysfunction.  He has had a recurrent right pleural effusion with thoracenteses in 5/19 and 8/20.  He thinks that he has been in atrial fibrillation persistently for about a year.  He used to take amiodarone but stopped it because he "felt bad."  However, he does not think that stopping amiodarone made him feel any better.  Creatinine has been elevated, CKD stage 3.   He has had more trouble with volume overload over the last few months.  Lasix increased in 12/20 to 60 mg bid.   He continues to have significant leg edema.  He does ok if he walks slowly.  He is short of breath with stairs/inclines.  He is short of breath carrying groceries.  No orthopnea/PND. No chest pain.  Minimal lightheadedness. BP runs low at times, 96/52 today.   ECG (personally reviewed): atrial fibrillation, LAFB, low voltage  Labs (12/20): K 4.5, creatinine 2.01 => 1.84  PMH: 1. Type 2 diabetes  2. HTN 3. Hyperlipidemia 4. CAD: CABG x 4 in 2007 with LIMA-LAD, SVG-OM, sequential SVG-PLV/PDA.   - LHC (2016): Patent grafts.  5. Atrial fibrillation: Persistent.  6. CKD stage 3.  7. Carotid stenosis:  - Carotid dopplers (8/20): 60-79% RICA stenosis.  8. Chronic systolic CHF: Ischemic cardiomyopathy.    - Echo (2016): EF 25-30% - Echo (2018): EF 25% - Echo (8/20): EF 20-25%, moderately decreased RV systolic function, severe LAE.  - St Jude ICD.  9. Pleural effusion on right: s/p thoracentesis in 5/19 and 8/20.   Social History   Socioeconomic History  . Marital status: Married    Spouse name: Not on file  . Number of children: 2  . Years of education: Not on file  . Highest education  level: Not on file  Occupational History  . Occupation: Horticulturist, commercial: RETIRED  Tobacco Use  . Smoking status: Former Smoker    Types: Cigarettes    Quit date: 03/26/1970    Years since quitting: 49.1  . Smokeless tobacco: Never Used  Substance and Sexual Activity  . Alcohol use: Yes    Alcohol/week: 1.0 standard drinks    Types: 1 Glasses of wine per week  . Drug use: No  . Sexual activity: Not on file  Other Topics Concern  . Not on file  Social History Narrative  . Not on file   Social Determinants of Health   Financial Resource Strain:   . Difficulty of Paying Living Expenses: Not on file  Food Insecurity:   . Worried About Charity fundraiser in the Last Year: Not on file  . Ran Out of Food in the Last Year: Not on file  Transportation Needs:   . Lack of Transportation (Medical): Not on file  . Lack of Transportation (Non-Medical): Not on file  Physical Activity:   . Days of Exercise per Week: Not on file  . Minutes of Exercise per Session: Not on file  Stress:   . Feeling of Stress : Not on file  Social Connections:   . Frequency of Communication with Friends and Family: Not on file  . Frequency of Social  Gatherings with Friends and Family: Not on file  . Attends Religious Services: Not on file  . Active Member of Clubs or Organizations: Not on file  . Attends Archivist Meetings: Not on file  . Marital Status: Not on file  Intimate Partner Violence:   . Fear of Current or Ex-Partner: Not on file  . Emotionally Abused: Not on file  . Physically Abused: Not on file  . Sexually Abused: Not on file   Family History  Problem Relation Age of Onset  . Heart disease Father 44       Heart disease before age 76  . Heart attack Father   . Hypertension Father   . Hyperlipidemia Father   . Varicose Veins Father   . Stroke Father        ? not sure  . Colon cancer Maternal Aunt   . Colon cancer Maternal Uncle   . Diverticulitis Other   . Heart  attack Other    ROS: All systems reviewed and negative except as per HPI.   Current Outpatient Medications  Medication Sig Dispense Refill  . apixaban (ELIQUIS) 2.5 MG TABS tablet Take 1 tablet (2.5 mg total) by mouth 2 (two) times daily. 180 tablet 1  . calcium-vitamin D (OSCAL WITH D) 500-200 MG-UNIT per tablet Take 1 tablet by mouth daily.     . Cholecalciferol (VITAMIN D3) 2000 UNITS TABS Take 2,000 Units by mouth daily.     . Coenzyme Q10 (CO Q 10 PO) Take 300 mg by mouth daily.    . fish oil-omega-3 fatty acids 1000 MG capsule Take 1 g by mouth daily.      . furosemide (LASIX) 80 MG tablet Take 1 tablet (80 mg total) by mouth 2 (two) times daily. 60 tablet 3  . gabapentin (NEURONTIN) 300 MG capsule Take 300 mg by mouth 2 (two) times daily as needed.    [name] glipiZIDE (GLUCOTROL) 10 MG tablet Take 10 mg by mouth 2 (two) times daily before a meal.    . metFORMIN (GLUCOPHAGE) 500 MG tablet Take 2 tablets (1,000 mg total) by mouth 2 (two) times daily with a meal.    . metoprolol succinate (TOPROL-XL) 25 MG 24 hr tablet Take 1 tablet (25 mg total) by mouth daily. 90 tablet 3  . Multiple Vitamin (MULTI-DAY VITAMINS PO) Take 1 tablet by mouth daily.     [name] omeprazole (PRILOSEC) 20 MG capsule Take 20 mg by mouth 2 (two) times daily before a meal.     . sacubitril-valsartan (ENTRESTO) 24-26 MG Take 1 tablet by mouth 2 (two) times daily. 180 tablet 3  . simvastatin (ZOCOR) 20 MG tablet Take 20 mg by mouth daily at 6 PM.     . Turmeric 450 MG CAPS Take 450 mg by mouth daily.     . vitamin C (ASCORBIC ACID) 500 MG tablet Take 500 mg by mouth daily.     . vitamin E 1000 UNIT capsule Take 1,000 Units by mouth every other day.    [name] amiodarone (PACERONE) 200 MG tablet Take 1 tab twice a day for 2 weeks then 1 tab daily after that 45 tablet 4  . spironolactone (ALDACTONE) 25 MG tablet Take 0.5 tablets (12.5 mg total) by mouth daily. 45 tablet 3   No current facility-administered medications for this  encounter.   BP (!) 98/52   Pulse 87   Wt 83.5 kg (184 lb)   SpO2 98%   BMI 25.66 kg/m  General: NAD Neck: JVP 10 cm, no thyromegaly or thyroid nodule.  Lungs: Decreased BS right base.  CV: Nondisplaced PMI.  Heart irregular S1/S2, no S3/S4, no murmur.  2+ edema to knees.  No carotid bruit.  Normal pedal pulses.  Abdomen: Soft, nontender, no hepatosplenomegaly, no distention.  Skin: Intact without lesions or rashes.  Neurologic: Alert and oriented x 3.  Psych: Normal affect. Extremities: No clubbing or cyanosis.  HEENT: Normal.    Assessment/Plan: 1. Chronic systolic CHF: Ischemic cardiomyopathy.  Echo in 8/20 with EF 20-25%, moderately decreased RV systolic function.  St Jude ICD.  NYHA class III symptoms.  He is significantly volume overloaded on exam.  This is complicated by CKD stage 3.   - Increase Lasix to 80 mg bid.  BMET today and in 10 days.  - Add spironolactone 12.5 mg daily.  - Wear graded compression stockings during the day.  - Continue Toprol XL 25 mg daily.  - Continue Entresto 24/26 bid. No BP room to increase.  - I think he would benefit from resumption of NSR (see below).  - Narrow QRS, not candidate for upgrade to CRT.  2. CAD: S/p CABG 2007, patent grafts on LHC in 2016. No exertional chest pain.  - Given stable CAD and Eliquis use, he can stop ASA.  - Continue statin.  3. Atrial fibrillation: Persistent.  Patient feels like he has been in atrial fibrillation for about a year.  I do not think that he will convert long-term without an anti-arrhythmic medication, and amiodarone is the only viable choice.   - Start amiodarone 200 mg bid x 2 wks then 200 mg daily.  - DCCV will be arranged in 2 wks.  He has not missed any Eliquis doses.  We discussed risks/benefits and he agrees to procedure.  4. Right chronic pleural effusion: Decreased BS at right base.  - CXR PA/lateral to reassess effusion.  5. CKD stage 3: Follow closely with diuresis.  6. Carotid stenosis:  Repeat dopplers in 8/21.    Followup in 3 wks.   [name] 04/29/2019

## 2019-04-30 ENCOUNTER — Other Ambulatory Visit (HOSPITAL_COMMUNITY): Payer: Self-pay

## 2019-05-08 ENCOUNTER — Other Ambulatory Visit (HOSPITAL_COMMUNITY)
Admission: RE | Admit: 2019-05-08 | Discharge: 2019-05-08 | Disposition: A | Discharge status: Home / Self Care | Payer: PPO | Source: Ambulatory Visit | Attending: Cardiology | Admitting: Cardiology

## 2019-05-08 ENCOUNTER — Other Ambulatory Visit: Payer: Self-pay

## 2019-05-08 ENCOUNTER — Ambulatory Visit (HOSPITAL_COMMUNITY)
Admission: RE | Admit: 2019-05-08 | Discharge: 2019-05-08 | Disposition: A | Discharge status: Home / Self Care | Payer: PPO | Source: Ambulatory Visit | Attending: Cardiology | Admitting: Cardiology

## 2019-05-08 DIAGNOSIS — Z20822 Contact with and (suspected) exposure to covid-19: Secondary | ICD-10-CM | POA: Diagnosis not present

## 2019-05-08 DIAGNOSIS — I4891 Unspecified atrial fibrillation: Secondary | ICD-10-CM | POA: Insufficient documentation

## 2019-05-08 DIAGNOSIS — Z01812 Encounter for preprocedural laboratory examination: Secondary | ICD-10-CM | POA: Diagnosis not present

## 2019-05-08 LAB — COMPREHENSIVE METABOLIC PANEL
ALT: 23 U/L (ref 0–44)
AST: 37 U/L (ref 15–41)
Albumin: 3.4 g/dL — ABNORMAL LOW (ref 3.5–5.0)
Alkaline Phosphatase: 68 U/L (ref 38–126)
Anion gap: 19 — ABNORMAL HIGH (ref 5–15)
BUN: 73 mg/dL — ABNORMAL HIGH (ref 8–23)
CO2: 24 mmol/L (ref 22–32)
Calcium: 9.4 mg/dL (ref 8.9–10.3)
Chloride: 90 mmol/L — ABNORMAL LOW (ref 98–111)
Creatinine, Ser: 3.13 mg/dL — ABNORMAL HIGH (ref 0.61–1.24)
GFR calc Af Amer: 21 mL/min — ABNORMAL LOW (ref >60)
GFR calc non Af Amer: 18 mL/min — ABNORMAL LOW (ref >60)
Glucose, Bld: 247 mg/dL — ABNORMAL HIGH (ref 70–99)
Potassium: 4.8 mmol/L (ref 3.5–5.1)
Sodium: 133 mmol/L — ABNORMAL LOW (ref 135–145)
Total Bilirubin: 1.3 mg/dL — ABNORMAL HIGH (ref 0.3–1.2)
Total Protein: 7.5 g/dL (ref 6.5–8.1)

## 2019-05-08 LAB — SARS CORONAVIRUS 2 (TAT 6-24 HRS): SARS Coronavirus 2: NEGATIVE

## 2019-05-08 NOTE — Progress Notes (Signed)
Attempted pre op call for endo procedure on Monday 2/15, no answer and automated voicemail so no message left.

## 2019-05-11 ENCOUNTER — Ambulatory Visit (HOSPITAL_COMMUNITY)
Admission: RE | Admit: 2019-05-11 | Discharge: 2019-05-11 | Disposition: A | Discharge status: Home / Self Care | Payer: PPO | Attending: Cardiology | Admitting: Cardiology

## 2019-05-11 ENCOUNTER — Ambulatory Visit (HOSPITAL_COMMUNITY): Payer: PPO

## 2019-05-11 ENCOUNTER — Encounter (HOSPITAL_COMMUNITY)
Admission: RE | Disposition: A | Discharge status: Home / Self Care | Payer: Self-pay | Source: Home / Self Care | Attending: Cardiology

## 2019-05-11 ENCOUNTER — Other Ambulatory Visit: Payer: Self-pay

## 2019-05-11 DIAGNOSIS — I2510 Atherosclerotic heart disease of native coronary artery without angina pectoris: Secondary | ICD-10-CM | POA: Diagnosis not present

## 2019-05-11 DIAGNOSIS — I255 Ischemic cardiomyopathy: Secondary | ICD-10-CM | POA: Diagnosis not present

## 2019-05-11 DIAGNOSIS — Z95810 Presence of automatic (implantable) cardiac defibrillator: Secondary | ICD-10-CM | POA: Diagnosis not present

## 2019-05-11 DIAGNOSIS — Z8249 Family history of ischemic heart disease and other diseases of the circulatory system: Secondary | ICD-10-CM | POA: Insufficient documentation

## 2019-05-11 DIAGNOSIS — I5022 Chronic systolic (congestive) heart failure: Secondary | ICD-10-CM | POA: Diagnosis not present

## 2019-05-11 DIAGNOSIS — N183 Chronic kidney disease, stage 3 unspecified: Secondary | ICD-10-CM | POA: Insufficient documentation

## 2019-05-11 DIAGNOSIS — I6521 Occlusion and stenosis of right carotid artery: Secondary | ICD-10-CM | POA: Insufficient documentation

## 2019-05-11 DIAGNOSIS — Z7984 Long term (current) use of oral hypoglycemic drugs: Secondary | ICD-10-CM | POA: Diagnosis not present

## 2019-05-11 DIAGNOSIS — E1122 Type 2 diabetes mellitus with diabetic chronic kidney disease: Secondary | ICD-10-CM | POA: Diagnosis not present

## 2019-05-11 DIAGNOSIS — J90 Pleural effusion, not elsewhere classified: Secondary | ICD-10-CM | POA: Insufficient documentation

## 2019-05-11 DIAGNOSIS — E785 Hyperlipidemia, unspecified: Secondary | ICD-10-CM | POA: Diagnosis not present

## 2019-05-11 DIAGNOSIS — I129 Hypertensive chronic kidney disease with stage 1 through stage 4 chronic kidney disease, or unspecified chronic kidney disease: Secondary | ICD-10-CM | POA: Diagnosis not present

## 2019-05-11 DIAGNOSIS — Z7901 Long term (current) use of anticoagulants: Secondary | ICD-10-CM | POA: Insufficient documentation

## 2019-05-11 DIAGNOSIS — Z79899 Other long term (current) drug therapy: Secondary | ICD-10-CM | POA: Diagnosis not present

## 2019-05-11 DIAGNOSIS — I4819 Other persistent atrial fibrillation: Secondary | ICD-10-CM | POA: Insufficient documentation

## 2019-05-11 DIAGNOSIS — I130 Hypertensive heart and chronic kidney disease with heart failure and stage 1 through stage 4 chronic kidney disease, or unspecified chronic kidney disease: Secondary | ICD-10-CM | POA: Diagnosis not present

## 2019-05-11 DIAGNOSIS — Z87891 Personal history of nicotine dependence: Secondary | ICD-10-CM | POA: Diagnosis not present

## 2019-05-11 DIAGNOSIS — Z951 Presence of aortocoronary bypass graft: Secondary | ICD-10-CM | POA: Diagnosis not present

## 2019-05-11 DIAGNOSIS — I4891 Unspecified atrial fibrillation: Secondary | ICD-10-CM | POA: Diagnosis not present

## 2019-05-11 DIAGNOSIS — I25110 Atherosclerotic heart disease of native coronary artery with unstable angina pectoris: Secondary | ICD-10-CM | POA: Diagnosis not present

## 2019-05-11 HISTORY — PX: CARDIOVERSION: SHX1299

## 2019-05-11 SURGERY — CARDIOVERSION
Anesthesia: General

## 2019-05-11 MED ORDER — LIDOCAINE 2% (20 MG/ML) 5 ML SYRINGE
INTRAMUSCULAR | Status: DC | PRN
  Administered 2019-05-11: 20 mg via INTRAVENOUS

## 2019-05-11 MED ORDER — SODIUM CHLORIDE 0.9 % IV SOLN: INTRAVENOUS | Status: DC | PRN

## 2019-05-11 MED ORDER — SODIUM CHLORIDE 0.9 % IV SOLN: INTRAVENOUS | Status: DC

## 2019-05-11 MED ORDER — FUROSEMIDE 20 MG PO TABS: 60.0000 mg | ORAL_TABLET | Freq: Two times a day (BID) | ORAL | 6 refills | Status: DC

## 2019-05-11 MED ORDER — ETOMIDATE 2 MG/ML IV SOLN
INTRAVENOUS | Status: DC | PRN
  Administered 2019-05-11: 60 mg via INTRAVENOUS

## 2019-05-11 NOTE — Discharge Instructions (Signed)
Electrical Cardioversion Electrical cardioversion is the delivery of a jolt of electricity to restore a normal rhythm to the heart. A rhythm that is too fast or is not regular keeps the heart from pumping well. In this procedure, sticky patches or metal paddles are placed on the chest to deliver electricity to the heart from a device. This procedure may be done in an emergency if:  There is low or no blood pressure as a result of the heart rhythm.  Normal rhythm must be restored as fast as possible to protect the brain and heart from further damage.  It may save a life. This may also be a scheduled procedure for irregular or fast heart rhythms that are not immediately life-threatening. Tell a health care provider about:  Any allergies you have.  All medicines you are taking, including vitamins, herbs, eye drops, creams, and over-the-counter medicines.  Any problems you or family members have had with anesthetic medicines.  Any blood disorders you have.  Any surgeries you have had.  Any medical conditions you have.  Whether you are pregnant or may be pregnant. What are the risks? Generally, this is a safe procedure. However, problems may occur, including:  Allergic reactions to medicines.  A blood clot that breaks free and travels to other parts of your body.  The possible return of an abnormal heart rhythm within hours or days after the procedure.  Your heart stopping (cardiac arrest). This is rare. What happens before the procedure? Medicines  Your health care provider may have you start taking: ? Blood-thinning medicines (anticoagulants) so your blood does not clot as easily. ? Medicines to help stabilize your heart rate and rhythm.  Ask your health care provider about: ? Changing or stopping your regular medicines. This is especially important if you are taking diabetes medicines or blood thinners. ? Taking medicines such as aspirin and ibuprofen. These medicines can  thin your blood. Do not take these medicines unless your health care provider tells you to take them. ? Taking over-the-counter medicines, vitamins, herbs, and supplements. General instructions  Follow instructions from your health care provider about eating or drinking restrictions.  Plan to have someone take you home from the hospital or clinic.  If you will be going home right after the procedure, plan to have someone with you for 24 hours.  Ask your health care provider what steps will be taken to help prevent infection. These may include washing your skin with a germ-killing soap. What happens during the procedure?   An IV will be inserted into one of your veins.  Sticky patches (electrodes) or metal paddles may be placed on your chest.  You will be given a medicine to help you relax (sedative).  An electrical shock will be delivered. The procedure may vary among health care providers and hospitals. What can I expect after the procedure?  Your blood pressure, heart rate, breathing rate, and blood oxygen level will be monitored until you leave the hospital or clinic.  Your heart rhythm will be watched to make sure it does not change.  You may have some redness on the skin where the shocks were given. Follow these instructions at home:  Do not drive for 24 hours if you were given a sedative during your procedure.  Take over-the-counter and prescription medicines only as told by your health care provider.  Ask your health care provider how to check your pulse. Check it often.  Rest for 48 hours after the procedure or   as told by your health care provider.  Avoid or limit your caffeine use as told by your health care provider.  Keep all follow-up visits as told by your health care provider. This is important. Contact a health care provider if:  You feel like your heart is beating too quickly or your pulse is not regular.  You have a serious muscle cramp that does not go  away. Get help right away if:  You have discomfort in your chest.  You are dizzy or you feel faint.  You have trouble breathing or you are short of breath.  Your speech is slurred.  You have trouble moving an arm or leg on one side of your body.  Your fingers or toes turn cold or blue. Summary  Electrical cardioversion is the delivery of a jolt of electricity to restore a normal rhythm to the heart.  This procedure may be done right away in an emergency or may be a scheduled procedure if the condition is not an emergency.  Generally, this is a safe procedure.  After the procedure, check your pulse often as told by your health care provider. This information is not intended to replace advice given to you by your health care provider. Make sure you discuss any questions you have with your health care provider. Document Revised: 10/13/2018 Document Reviewed: 10/13/2018 Elsevier Patient Education  2020 [employer]  

## 2019-05-11 NOTE — Procedures (Signed)
Electrical Cardioversion Procedure Note [name] [id number] November 07, 1935  Procedure: Electrical Cardioversion Indications:  Atrial Fibrillation  Procedure Details Consent: Risks of procedure as well as the alternatives and risks of each were explained to the (patient/caregiver).  Consent for procedure obtained. Time Out: Verified patient identification, verified procedure, site/side was marked, verified correct patient position, special equipment/implants available, medications/allergies/relevent history reviewed, required imaging and test results available.  Performed  Patient placed on cardiac monitor, pulse oximetry, supplemental oxygen as necessary.  Sedation given: Per anesthesiology Pacer pads placed anterior and posterior chest.  Cardioverted 1 time(s).  Cardioverted at [place].  Evaluation Findings: Post procedure EKG shows: NSR Complications: None Patient did tolerate procedure well.   [name] 05/11/2019, 10:10 AM

## 2019-05-11 NOTE — Anesthesia Preprocedure Evaluation (Addendum)
Anesthesia Evaluation  Patient identified by MRN, date of birth, ID band Patient awake    Reviewed: Allergy & Precautions, NPO status , Patient's Chart, lab work & pertinent test results, reviewed documented beta blocker date and time   History of Anesthesia Complications Negative for: history of anesthetic complications  Airway Mallampati: II  TM Distance: >3 FB Neck ROM: Full    Dental  (+) Dental Advisory Given, Caps   Pulmonary shortness of breath, former smoker (quit 1972),  05/08/2019 SARS coronaviurs NEG   breath sounds clear to auscultation       Cardiovascular hypertension, Pt. on medications and Pt. on home beta blockers (-) angina+ CAD, + CABG and + Peripheral Vascular Disease  + dysrhythmias Atrial Fibrillation + pacemaker + Cardiac Defibrillator  Rhythm:Irregular Rate:Normal  10/2018 ECHO: EF 20-25%, valves OK   Neuro/Psych negative neurological ROS     GI/Hepatic Neg liver ROS, GERD  Medicated and Controlled,  Endo/Other  diabetes (glu 247), Oral Hypoglycemic Agents  Renal/GU Renal InsufficiencyRenal disease (creat 3.13)     Musculoskeletal   Abdominal   Peds  Hematology eliquis   Anesthesia Other Findings   Reproductive/Obstetrics                            Anesthesia Physical Anesthesia Plan  ASA: III  Anesthesia Plan: General   Post-op Pain Management:    Induction: Intravenous  PONV Risk Score and Plan: 2 and Treatment may vary due to age or medical condition  Airway Management Planned: Simple Face Mask and Natural Airway  Additional Equipment:   Intra-op Plan:   Post-operative Plan:   Informed Consent: I have reviewed the patients History and Physical, chart, labs and discussed the procedure including the risks, benefits and alternatives for the proposed anesthesia with the patient or authorized representative who has indicated his/her understanding and  acceptance.     Dental advisory given  Plan Discussed with: CRNA and Surgeon  Anesthesia Plan Comments:        Anesthesia Quick Evaluation

## 2019-05-11 NOTE — Anesthesia Postprocedure Evaluation (Signed)
Anesthesia Post Note  Patient: [name].  Procedure(s) Performed: CARDIOVERSION ([name] )     Patient location during evaluation: Endoscopy Anesthesia Type: General Level of consciousness: awake and alert, awake and oriented Pain management: pain level controlled Vital Signs Assessment: post-procedure vital signs reviewed and stable Respiratory status: spontaneous breathing, nonlabored ventilation and respiratory function stable Cardiovascular status: blood pressure returned to baseline and stable Postop Assessment: no apparent nausea or vomiting Anesthetic complications: no    Last Vitals:  Vitals:   05/11/19 1015 05/11/19 1025  BP: 130/61 (!) 144/75  Pulse: 61 65  Resp: 19 16  Temp: (!) 36.4 C   SpO2: 100% 100%    Last Pain:  Vitals:   05/11/19 1025  TempSrc:   PainSc: 0-No pain                 Milas Schappell,E. Jlynn Ly

## 2019-05-11 NOTE — Transfer of Care (Signed)
Immediate Anesthesia Transfer of Care Note  Patient: [name].  Procedure(s) Performed: CARDIOVERSION ([name] )  Patient Location: Endoscopy Unit  Anesthesia Type:MAC  Level of Consciousness: awake, alert  and oriented  Airway & Oxygen Therapy: Patient Spontanous Breathing and Patient connected to nasal cannula oxygen  Post-op Assessment: Report given to RN, Post -op Vital signs reviewed and stable and Patient moving all extremities  Post vital signs: Reviewed and stable  Last Vitals:  Vitals Value Taken Time  BP    Temp    Pulse    Resp    SpO2      Last Pain:  Vitals:   05/11/19 0848  TempSrc: Oral  PainSc: 0-No pain         Complications: No apparent anesthesia complications

## 2019-05-11 NOTE — Interval H&P Note (Signed)
History and Physical Interval Note:  05/11/2019 9:59 AM  [name].  has presented today for surgery, with the diagnosis of A-FIB.  The various methods of treatment have been discussed with the patient and family. After consideration of risks, benefits and other options for treatment, the patient has consented to  Procedure(s): CARDIOVERSION ([name]) as a surgical intervention.  The patient's history has been reviewed, patient examined, no change in status, stable for surgery.  I have reviewed the patient's chart and labs.  Questions were answered to the patient's satisfaction.     Iley Deignan [employer]

## 2019-05-11 NOTE — Anesthesia Procedure Notes (Signed)
Procedure Name: MAC Date/Time: 05/11/2019 9:57 AM Performed by: Amadeo Garnet, CRNA Pre-anesthesia Checklist: Patient identified, Emergency Drugs available, Suction available and Patient being monitored Oxygen Delivery Method: Ambu bag Preoxygenation: Pre-oxygenation with 100% oxygen Induction Type: IV induction Placement Confirmation: positive ETCO2 Dental Injury: Teeth and Oropharynx as per pre-operative assessment

## 2019-05-12 ENCOUNTER — Telehealth (HOSPITAL_COMMUNITY): Payer: Self-pay | Admitting: *Deleted

## 2019-05-12 DIAGNOSIS — I5022 Chronic systolic (congestive) heart failure: Secondary | ICD-10-CM

## 2019-05-12 NOTE — Telephone Encounter (Signed)
-----   Message from Larey Dresser, MD sent at 05/10/2019 10:22 PM EST ----- Creatinine significantly higher.  Stop Entresto and spironolactone.  Hold Lasix x 2 days then decrease to 60 mg bid.  BMET Friday.

## 2019-05-12 NOTE — Telephone Encounter (Signed)
[name], [place]  05/12/2019 9:42 AM EST    Pt returned call he is aware and agreeable with plan lab appt scheduled.    Shonna Chock, CMA  0000000 3:33 PM EST    Left message to return call    Larey Dresser, MD  05/10/2019 10:22 PM EST    Creatinine significantly higher. Stop Entresto and spironolactone. Hold Lasix x 2 days then decrease to 60 mg bid. BMET Friday.

## 2019-05-15 ENCOUNTER — Other Ambulatory Visit: Payer: Self-pay

## 2019-05-15 ENCOUNTER — Telehealth (HOSPITAL_COMMUNITY): Payer: Self-pay

## 2019-05-15 ENCOUNTER — Ambulatory Visit (HOSPITAL_COMMUNITY)
Admission: RE | Admit: 2019-05-15 | Discharge: 2019-05-15 | Disposition: A | Discharge status: Home / Self Care | Payer: PPO | Source: Ambulatory Visit | Attending: Cardiology | Admitting: Cardiology

## 2019-05-15 DIAGNOSIS — I5022 Chronic systolic (congestive) heart failure: Secondary | ICD-10-CM | POA: Insufficient documentation

## 2019-05-15 LAB — BASIC METABOLIC PANEL
Anion gap: 16 — ABNORMAL HIGH (ref 5–15)
BUN: 80 mg/dL — ABNORMAL HIGH (ref 8–23)
CO2: 27 mmol/L (ref 22–32)
Calcium: 9.2 mg/dL (ref 8.9–10.3)
Chloride: 92 mmol/L — ABNORMAL LOW (ref 98–111)
Creatinine, Ser: 3.72 mg/dL — ABNORMAL HIGH (ref 0.61–1.24)
GFR calc Af Amer: 17 mL/min — ABNORMAL LOW (ref >60)
GFR calc non Af Amer: 14 mL/min — ABNORMAL LOW (ref >60)
Glucose, Bld: 153 mg/dL — ABNORMAL HIGH (ref 70–99)
Potassium: 3.9 mmol/L (ref 3.5–5.1)
Sodium: 135 mmol/L (ref 135–145)

## 2019-05-15 MED ORDER — FUROSEMIDE 20 MG PO TABS: 60.0000 mg | ORAL_TABLET | Freq: Every day | ORAL | 0 refills | Status: DC

## 2019-05-15 NOTE — Telephone Encounter (Signed)
-----   Message from Larey Dresser, MD sent at 05/15/2019  2:20 PM EST ----- Creatinine still well above his baseline.  Make sure he is off Entresto and spironolactone.  If he is taking Lasix 60 mg bid, hold for 1 day then decrease to 60 mg daily.

## 2019-05-19 ENCOUNTER — Telehealth (HOSPITAL_COMMUNITY): Payer: Self-pay

## 2019-05-19 NOTE — Telephone Encounter (Signed)

## 2019-05-19 NOTE — Telephone Encounter (Signed)

## 2019-05-20 ENCOUNTER — Other Ambulatory Visit (HOSPITAL_COMMUNITY): Payer: Self-pay

## 2019-05-20 ENCOUNTER — Other Ambulatory Visit (HOSPITAL_COMMUNITY)
Admission: RE | Admit: 2019-05-20 | Discharge: 2019-05-20 | Disposition: A | Discharge status: Home / Self Care | Payer: PPO | Source: Ambulatory Visit | Attending: Cardiology | Admitting: Cardiology

## 2019-05-20 ENCOUNTER — Other Ambulatory Visit: Payer: Self-pay

## 2019-05-20 ENCOUNTER — Ambulatory Visit (INDEPENDENT_AMBULATORY_CARE_PROVIDER_SITE_OTHER)
Admission: RE | Admit: 2019-05-20 | Discharge: 2019-05-20 | Disposition: A | Discharge status: Home / Self Care | Payer: PPO | Source: Ambulatory Visit | Attending: Vascular Surgery | Admitting: Vascular Surgery

## 2019-05-20 ENCOUNTER — Ambulatory Visit (HOSPITAL_BASED_OUTPATIENT_CLINIC_OR_DEPARTMENT_OTHER)
Admission: RE | Admit: 2019-05-20 | Discharge: 2019-05-20 | Disposition: A | Discharge status: Home / Self Care | Payer: PPO | Source: Ambulatory Visit | Attending: Cardiology | Admitting: Cardiology

## 2019-05-20 VITALS — BP 118/68 | HR 54 | Wt 179.0 lb

## 2019-05-20 DIAGNOSIS — I2510 Atherosclerotic heart disease of native coronary artery without angina pectoris: Secondary | ICD-10-CM | POA: Diagnosis present

## 2019-05-20 DIAGNOSIS — I5022 Chronic systolic (congestive) heart failure: Secondary | ICD-10-CM | POA: Diagnosis not present

## 2019-05-20 DIAGNOSIS — Z87891 Personal history of nicotine dependence: Secondary | ICD-10-CM | POA: Diagnosis not present

## 2019-05-20 DIAGNOSIS — I272 Pulmonary hypertension, unspecified: Secondary | ICD-10-CM

## 2019-05-20 DIAGNOSIS — Z01810 Encounter for preprocedural cardiovascular examination: Secondary | ICD-10-CM | POA: Insufficient documentation

## 2019-05-20 DIAGNOSIS — I130 Hypertensive heart and chronic kidney disease with heart failure and stage 1 through stage 4 chronic kidney disease, or unspecified chronic kidney disease: Secondary | ICD-10-CM | POA: Diagnosis present

## 2019-05-20 DIAGNOSIS — I6523 Occlusion and stenosis of bilateral carotid arteries: Secondary | ICD-10-CM

## 2019-05-20 DIAGNOSIS — I2721 Secondary pulmonary arterial hypertension: Secondary | ICD-10-CM | POA: Diagnosis present

## 2019-05-20 DIAGNOSIS — N179 Acute kidney failure, unspecified: Secondary | ICD-10-CM | POA: Diagnosis present

## 2019-05-20 DIAGNOSIS — I493 Ventricular premature depolarization: Secondary | ICD-10-CM | POA: Diagnosis not present

## 2019-05-20 DIAGNOSIS — E1122 Type 2 diabetes mellitus with diabetic chronic kidney disease: Secondary | ICD-10-CM | POA: Diagnosis present

## 2019-05-20 DIAGNOSIS — Z8249 Family history of ischemic heart disease and other diseases of the circulatory system: Secondary | ICD-10-CM | POA: Diagnosis not present

## 2019-05-20 DIAGNOSIS — E785 Hyperlipidemia, unspecified: Secondary | ICD-10-CM | POA: Diagnosis present

## 2019-05-20 DIAGNOSIS — I10 Essential (primary) hypertension: Secondary | ICD-10-CM | POA: Diagnosis not present

## 2019-05-20 DIAGNOSIS — I5043 Acute on chronic combined systolic (congestive) and diastolic (congestive) heart failure: Secondary | ICD-10-CM | POA: Diagnosis not present

## 2019-05-20 DIAGNOSIS — R0602 Shortness of breath: Secondary | ICD-10-CM | POA: Insufficient documentation

## 2019-05-20 DIAGNOSIS — I6521 Occlusion and stenosis of right carotid artery: Secondary | ICD-10-CM | POA: Diagnosis present

## 2019-05-20 DIAGNOSIS — I480 Paroxysmal atrial fibrillation: Secondary | ICD-10-CM | POA: Diagnosis present

## 2019-05-20 DIAGNOSIS — I129 Hypertensive chronic kidney disease with stage 1 through stage 4 chronic kidney disease, or unspecified chronic kidney disease: Secondary | ICD-10-CM | POA: Diagnosis not present

## 2019-05-20 DIAGNOSIS — Z01818 Encounter for other preprocedural examination: Secondary | ICD-10-CM | POA: Insufficient documentation

## 2019-05-20 DIAGNOSIS — Z7901 Long term (current) use of anticoagulants: Secondary | ICD-10-CM | POA: Diagnosis not present

## 2019-05-20 DIAGNOSIS — D649 Anemia, unspecified: Secondary | ICD-10-CM | POA: Diagnosis not present

## 2019-05-20 DIAGNOSIS — Z20822 Contact with and (suspected) exposure to covid-19: Secondary | ICD-10-CM | POA: Diagnosis present

## 2019-05-20 DIAGNOSIS — Z951 Presence of aortocoronary bypass graft: Secondary | ICD-10-CM | POA: Diagnosis not present

## 2019-05-20 DIAGNOSIS — N183 Chronic kidney disease, stage 3 unspecified: Secondary | ICD-10-CM | POA: Diagnosis present

## 2019-05-20 DIAGNOSIS — Z7984 Long term (current) use of oral hypoglycemic drugs: Secondary | ICD-10-CM | POA: Diagnosis not present

## 2019-05-20 DIAGNOSIS — Z79899 Other long term (current) drug therapy: Secondary | ICD-10-CM | POA: Diagnosis not present

## 2019-05-20 DIAGNOSIS — I4891 Unspecified atrial fibrillation: Secondary | ICD-10-CM | POA: Diagnosis not present

## 2019-05-20 DIAGNOSIS — I509 Heart failure, unspecified: Secondary | ICD-10-CM | POA: Diagnosis present

## 2019-05-20 DIAGNOSIS — I255 Ischemic cardiomyopathy: Secondary | ICD-10-CM | POA: Diagnosis present

## 2019-05-20 DIAGNOSIS — E782 Mixed hyperlipidemia: Secondary | ICD-10-CM | POA: Diagnosis not present

## 2019-05-20 DIAGNOSIS — J90 Pleural effusion, not elsewhere classified: Secondary | ICD-10-CM | POA: Insufficient documentation

## 2019-05-20 DIAGNOSIS — I429 Cardiomyopathy, unspecified: Secondary | ICD-10-CM | POA: Diagnosis not present

## 2019-05-20 DIAGNOSIS — I5023 Acute on chronic systolic (congestive) heart failure: Secondary | ICD-10-CM | POA: Diagnosis present

## 2019-05-20 DIAGNOSIS — Z01812 Encounter for preprocedural laboratory examination: Secondary | ICD-10-CM | POA: Insufficient documentation

## 2019-05-20 DIAGNOSIS — Z95810 Presence of automatic (implantable) cardiac defibrillator: Secondary | ICD-10-CM | POA: Diagnosis not present

## 2019-05-20 LAB — CBC
HCT: 34.5 % — ABNORMAL LOW (ref 39.0–52.0)
Hemoglobin: 10.7 g/dL — ABNORMAL LOW (ref 13.0–17.0)
MCH: 28.8 pg (ref 26.0–34.0)
MCHC: 31 g/dL (ref 30.0–36.0)
MCV: 92.7 fL (ref 80.0–100.0)
Platelets: 239 10*3/uL (ref 150–400)
RBC: 3.72 MIL/uL — ABNORMAL LOW (ref 4.22–5.81)
RDW: 17.7 % — ABNORMAL HIGH (ref 11.5–15.5)
WBC: 8.7 10*3/uL (ref 4.0–10.5)
nRBC: 0 % (ref 0.0–0.2)

## 2019-05-20 LAB — SARS CORONAVIRUS 2 (TAT 6-24 HRS): SARS Coronavirus 2: NEGATIVE

## 2019-05-20 LAB — COMPREHENSIVE METABOLIC PANEL
ALT: 20 U/L (ref 0–44)
AST: 23 U/L (ref 15–41)
Albumin: 3.5 g/dL (ref 3.5–5.0)
Alkaline Phosphatase: 60 U/L (ref 38–126)
Anion gap: 14 (ref 5–15)
BUN: 67 mg/dL — ABNORMAL HIGH (ref 8–23)
CO2: 24 mmol/L (ref 22–32)
Calcium: 9.4 mg/dL (ref 8.9–10.3)
Chloride: 99 mmol/L (ref 98–111)
Creatinine, Ser: 3.06 mg/dL — ABNORMAL HIGH (ref 0.61–1.24)
GFR calc Af Amer: 21 mL/min — ABNORMAL LOW (ref >60)
GFR calc non Af Amer: 18 mL/min — ABNORMAL LOW (ref >60)
Glucose, Bld: 173 mg/dL — ABNORMAL HIGH (ref 70–99)
Potassium: 4.9 mmol/L (ref 3.5–5.1)
Sodium: 137 mmol/L (ref 135–145)
Total Bilirubin: 0.9 mg/dL (ref 0.3–1.2)
Total Protein: 7 g/dL (ref 6.5–8.1)

## 2019-05-20 LAB — TSH: TSH: 5.225 u[IU]/mL — ABNORMAL HIGH (ref 0.350–4.500)

## 2019-05-20 MED ORDER — SODIUM CHLORIDE 0.9% FLUSH: 3.0000 mL | Freq: Two times a day (BID) | INTRAVENOUS | Status: DC

## 2019-05-20 MED ORDER — FUROSEMIDE 20 MG PO TABS: 60.0000 mg | ORAL_TABLET | Freq: Every day | ORAL | 4 refills | Status: DC

## 2019-05-20 NOTE — Patient Instructions (Addendum)
INCREASE Lasix to 60mg  (3 tabs) twice a day  Labs today We will only contact you if something comes back abnormal or we need to make some changes. Otherwise no news is good news!  Your physician recommends that you schedule a follow-up appointment in: 3 weeks with Dr Aundra Dubin  At the [facility], you and your health needs are our priority. As part of our continuing mission to provide you with exceptional heart care, we have created designated Provider Care Teams. These Care Teams include your primary Cardiologist (physician) and Advanced Practice Providers (APPs- Physician Assistants and Nurse Practitioners) who all work together to provide you with the care you need, when you need it.   You may see any of the following providers on your designated Care Team at your next follow up: [name] Dr Glori Bickers . Dr [name] . Darrick Grinder, NP . Lyda Jester, PA . Audry Riles, PharmD   Please be sure to bring in all your medications bottles to every appointment.    [facility] [id number] [place] [id number] Dept: [phone] Loc: [phone]  Habram Caler.  05/20/2019  You are scheduled for a Cardiac Catheterization on Friday, February 26 with Dr. [name].  1. Please arrive at the [facility] (Main Entrance A) at [facility]: [address] [place], [place] [address] at 10:30 AM (This time is two hours before your procedure to ensure your preparation). Free valet parking service is available.   Special note: Every effort is made to have your procedure done on time. Please understand that emergencies sometimes delay scheduled procedures.  2. Diet: Do not eat solid foods after midnight.  The patient may have clear liquids until 5am upon the day of the procedure.  Can take all morning medications at 5am.  3. Labs: Done today in office  You will  need a pre procedure COVID test     WHEN:  TODAY  Before 3pm WHERE: [facility]       [place] [id number]  This is a drive thru testing site, you will remain in your car. Be sure to get in the line FOR PROCEDURES Once you have been swabbed you will need to remain home in quarantine until you return for your procedure.   4. Medication instructions in preparation for your procedure:    Contrast Allergy: No   HOLD Lasix, Glipizide and Metformin on the morning your procedure  You can take all remaining medications with a small sip of water.    5. Plan for one night stay--bring personal belongings. 6. Bring a current list of your medications and current insurance cards. 7. You MUST have a responsible person to drive you home. 8. Someone MUST be with you the first 24 hours after you arrive home or your discharge will be delayed. 9. Please wear clothes that are easy to get on and off and wear slip-on shoes.  Thank you for allowing [place] to care for you!   --  Invasive Cardiovascular services   Please call office at [phone] option [phone] if you have any questions or concerns.

## 2019-05-21 NOTE — H&P (View-Only) (Signed)
PCP: Shirline Frees, MD Cardiology: Dr. Angelena Form  84 y.o. with h/o CAD s/p CABG, ischemic cardiomyopathy, and diabetes was referred by Dr. Angelena Form for evaluation of CHF.  Patient had CABG in 2007.  Last cath in 2016 showed all 4 grafts patent.  Echo was 25-30% in 2016.  Most recent echo in 8/20 showed EF 20-25% with moderate RV dysfunction.  He has had a recurrent right pleural effusion with thoracenteses in 5/19 and 8/20.  He thinks that he has been in atrial fibrillation persistently for about a year.  He used to take amiodarone but stopped it because he "felt bad."  However, he does not think that stopping amiodarone made him feel any better.  Creatinine has been elevated, CKD stage 3.   At last appointment, I thought he was volume overloaded and increased Lasix, but his creatinine steadily rose (2.55 => 3.13 => 3.72). I stopped Entresto and he never started spironolactone.  Lasix was cut back to 60 mg daily. He says that during this time, he got his COVID vaccination and after that, he became severely nauseated for a number of days and ate/drank very little.  This eventually resolved.   On 05/11/19, I cardioverted him back to NSR.    He returns for followup of CHF.  He currently feels better than he has for a year.  However, he is still short of breath if he walks fast or walks up stairs.  He still has significant peripheral edema.  No orthopnea/PND.  No chest pain.  No lightheadedness.  Weight is down 5 lbs.   ECG (personally reviewed): NSR at 53  Labs (12/20): K 4.5, creatinine 2.01 => 1.84  Labs (2/21): K 3.9, creatinine 2.55 => 3.13 => 3.72  PMH: 1. Type 2 diabetes  2. HTN 3. Hyperlipidemia 4. CAD: CABG x 4 in 2007 with LIMA-LAD, SVG-OM, sequential SVG-PLV/PDA.   - LHC (2016): Patent grafts.  5. Atrial fibrillation: Persistent.  - DCCV to NSR 2/21.  6. CKD stage 3.  7. Carotid stenosis:  - Carotid dopplers (8/20): 60-79% RICA stenosis.  8. Chronic systolic CHF: Ischemic  cardiomyopathy.    - Echo (2016): EF 25-30% - Echo (2018): EF 25% - Echo (8/20): EF 20-25%, moderately decreased RV systolic function, severe LAE.  - St Jude ICD.  9. Pleural effusion on right: s/p thoracentesis in 5/19 and 8/20.   Social History   Socioeconomic History  . Marital status: Married    Spouse name: Not on file  . Number of children: 2  . Years of education: Not on file  . Highest education level: Not on file  Occupational History  . Occupation: Horticulturist, commercial: RETIRED  Tobacco Use  . Smoking status: Former Smoker    Types: Cigarettes    Quit date: 03/26/1970    Years since quitting: 49.1  . Smokeless tobacco: Never Used  Substance and Sexual Activity  . Alcohol use: Yes    Alcohol/week: 1.0 standard drinks    Types: 1 Glasses of wine per week  . Drug use: No  . Sexual activity: Not on file  Other Topics Concern  . Not on file  Social History Narrative  . Not on file   Social Determinants of Health   Financial Resource Strain:   . Difficulty of Paying Living Expenses: Not on file  Food Insecurity:   . Worried About Charity fundraiser in the Last Year: Not on file  . Ran Out of Food in the Last  Year: Not on file  Transportation Needs:   . Lack of Transportation (Medical): Not on file  . Lack of Transportation (Non-Medical): Not on file  Physical Activity:   . Days of Exercise per Week: Not on file  . Minutes of Exercise per Session: Not on file  Stress:   . Feeling of Stress : Not on file  Social Connections:   . Frequency of Communication with Friends and Family: Not on file  . Frequency of Social Gatherings with Friends and Family: Not on file  . Attends Religious Services: Not on file  . Active Member of Clubs or Organizations: Not on file  . Attends Archivist Meetings: Not on file  . Marital Status: Not on file  Intimate Partner Violence:   . Fear of Current or Ex-Partner: Not on file  . Emotionally Abused: Not on file   . Physically Abused: Not on file  . Sexually Abused: Not on file   Family History  Problem Relation Age of Onset  . Heart disease Father 12       Heart disease before age 58  . Heart attack Father   . Hypertension Father   . Hyperlipidemia Father   . Varicose Veins Father   . Stroke Father        ? not sure  . Colon cancer Maternal Aunt   . Colon cancer Maternal Uncle   . Diverticulitis Other   . Heart attack Other    ROS: All systems reviewed and negative except as per HPI.   Current Outpatient Medications  Medication Sig Dispense Refill  . amiodarone (PACERONE) 200 MG tablet Take 1 tab twice a day for 2 weeks then 1 tab daily after that (Patient taking differently: Take 200 mg by mouth See admin instructions. Take 200 mg  tab twice a day for 2 weeks then 200 mg tab daily after that) 45 tablet 4  . apixaban (ELIQUIS) 2.5 MG TABS tablet Take 1 tablet (2.5 mg total) by mouth 2 (two) times daily. 180 tablet 1  . calcium-vitamin D (OSCAL WITH D) 500-200 MG-UNIT per tablet Take 1 tablet by mouth daily.     . Cholecalciferol (VITAMIN D3) 2000 UNITS TABS Take 2,000 Units by mouth daily. 50 mcg    . Coenzyme Q10 (CO Q 10) 100 MG CAPS Take 300 mg by mouth daily.     . fish oil-omega-3 fatty acids 1000 MG capsule Take 1 g by mouth daily.      . furosemide (LASIX) 20 MG tablet Take 3 tablets (60 mg total) by mouth daily. 180 tablet 4  . gabapentin (NEURONTIN) 300 MG capsule Take 300 mg by mouth 2 (two) times daily as needed (Nerve pain).     [name] glipiZIDE (GLUCOTROL) 10 MG tablet Take 10 mg by mouth 2 (two) times daily before a meal.    . metFORMIN (GLUCOPHAGE) 500 MG tablet Take 2 tablets (1,000 mg total) by mouth 2 (two) times daily with a meal.    . metoprolol succinate (TOPROL-XL) 25 MG 24 hr tablet Take 1 tablet (25 mg total) by mouth daily. 90 tablet 3  . Multiple Vitamin (MULTI-DAY VITAMINS PO) Take 1 tablet by mouth daily.     [name] omeprazole (PRILOSEC) 20 MG capsule Take 20 mg by mouth  2 (two) times daily before a meal.     . simvastatin (ZOCOR) 20 MG tablet Take 20 mg by mouth at bedtime.     . Turmeric 450 MG CAPS Take 450  mg by mouth daily.     . vitamin C (ASCORBIC ACID) 250 MG tablet Take 250 mg by mouth 2 (two) times daily.     . vitamin E 1000 UNIT capsule Take 1,000 Units by mouth every other day.     No current facility-administered medications for this encounter.   BP 118/68   Pulse (!) 54   Wt 81.2 kg (179 lb)   SpO2 99%   BMI 24.97 kg/m  General: NAD Neck: JVP 14-16 cm, no thyromegaly or thyroid nodule.  Lungs: Clear to auscultation bilaterally with normal respiratory effort. CV: Nondisplaced PMI.  Heart regular S1/S2, no S3/S4, no murmur.  1+ edema to thighs.  No carotid bruit.  Normal pedal pulses.  Abdomen: Soft, nontender, no hepatosplenomegaly, no distention.  Skin: Intact without lesions or rashes.  Neurologic: Alert and oriented x 3.  Psych: Normal affect. Extremities: No clubbing or cyanosis.  HEENT: Normal.    Assessment/Plan: 1. Chronic systolic CHF: Ischemic cardiomyopathy.  Echo in 8/20 with EF 20-25%, moderately decreased RV systolic function.  St Jude ICD.  NYHA class III symptoms.  He remains significantly volume overloaded on exam.  This is complicated by CKD stage 3 with recent AKI, last creatinine up to 3.72.  I am concerned about possible low output HF with cardiorenal syndrome.   - I am going to set him up for RHC this week.  Will assess filling pressures, but more importantly cardiac output.  If he has low output HF, I will plan to admit him for IV diuresis and start milrinone to facilitate this. Discussed risks/benefits with patient and daughter and he agrees to plan.  - He will stay off Entresto and spironolactone with AKI.  - Wear graded compression stockings during the day.  - Continue Toprol XL 25 mg daily.  - Narrow QRS, not candidate for upgrade to CRT.  - Given significant volume overload, will increase Lasix back to 60 mg  bid for now.  - Low voltage ECG, would consider eventual amyloidosis workup.  2. CAD: S/p CABG 2007, patent grafts on LHC in 2016. No exertional chest pain.  - Given stable CAD and Eliquis use, he is not on ASA.  - Continue statin.  3. Atrial fibrillation: Paroxysmal.  He is now back in NSR post-DCCV.  He feels much better in NSR.   - Continue amiodarone 200 mg daily.  Check LFTs and TSH today.  He will need a regular eye exam while on amiodarone.  4. Right chronic pleural effusion: Only small effusion on recent CXR.  5. CKD stage 3 with recent AKI: BMET today.  As above, concerned for cardiorenal syndrome and plan RHC.  May need milrinone support to facilitate diuresis.   6. Carotid stenosis: Repeat dopplers in 8/21.    [name] 05/21/2019

## 2019-05-21 NOTE — Progress Notes (Signed)
PCP: Shirline Frees, MD Cardiology: Dr. Angelena Form  84 y.o. with h/o CAD s/p CABG, ischemic cardiomyopathy, and diabetes was referred by Dr. Angelena Form for evaluation of CHF.  Patient had CABG in 2007.  Last cath in 2016 showed all 4 grafts patent.  Echo was 25-30% in 2016.  Most recent echo in 8/20 showed EF 20-25% with moderate RV dysfunction.  He has had a recurrent right pleural effusion with thoracenteses in 5/19 and 8/20.  He thinks that he has been in atrial fibrillation persistently for about a year.  He used to take amiodarone but stopped it because he "felt bad."  However, he does not think that stopping amiodarone made him feel any better.  Creatinine has been elevated, CKD stage 3.   At last appointment, I thought he was volume overloaded and increased Lasix, but his creatinine steadily rose (2.55 => 3.13 => 3.72). I stopped Entresto and he never started spironolactone.  Lasix was cut back to 60 mg daily. He says that during this time, he got his COVID vaccination and after that, he became severely nauseated for a number of days and ate/drank very little.  This eventually resolved.   On 05/11/19, I cardioverted him back to NSR.    He returns for followup of CHF.  He currently feels better than he has for a year.  However, he is still short of breath if he walks fast or walks up stairs.  He still has significant peripheral edema.  No orthopnea/PND.  No chest pain.  No lightheadedness.  Weight is down 5 lbs.   ECG (personally reviewed): NSR at 53  Labs (12/20): K 4.5, creatinine 2.01 => 1.84  Labs (2/21): K 3.9, creatinine 2.55 => 3.13 => 3.72  PMH: 1. Type 2 diabetes  2. HTN 3. Hyperlipidemia 4. CAD: CABG x 4 in 2007 with LIMA-LAD, SVG-OM, sequential SVG-PLV/PDA.   - LHC (2016): Patent grafts.  5. Atrial fibrillation: Persistent.  - DCCV to NSR 2/21.  6. CKD stage 3.  7. Carotid stenosis:  - Carotid dopplers (8/20): 60-79% RICA stenosis.  8. Chronic systolic CHF: Ischemic  cardiomyopathy.    - Echo (2016): EF 25-30% - Echo (2018): EF 25% - Echo (8/20): EF 20-25%, moderately decreased RV systolic function, severe LAE.  - St Jude ICD.  9. Pleural effusion on right: s/p thoracentesis in 5/19 and 8/20.   Social History   Socioeconomic History  . Marital status: Married    Spouse name: Not on file  . Number of children: 2  . Years of education: Not on file  . Highest education level: Not on file  Occupational History  . Occupation: Horticulturist, commercial: RETIRED  Tobacco Use  . Smoking status: Former Smoker    Types: Cigarettes    Quit date: 03/26/1970    Years since quitting: 49.1  . Smokeless tobacco: Never Used  Substance and Sexual Activity  . Alcohol use: Yes    Alcohol/week: 1.0 standard drinks    Types: 1 Glasses of wine per week  . Drug use: No  . Sexual activity: Not on file  Other Topics Concern  . Not on file  Social History Narrative  . Not on file   Social Determinants of Health   Financial Resource Strain:   . Difficulty of Paying Living Expenses: Not on file  Food Insecurity:   . Worried About Charity fundraiser in the Last Year: Not on file  . Ran Out of Food in the Last  Year: Not on file  Transportation Needs:   . Lack of Transportation (Medical): Not on file  . Lack of Transportation (Non-Medical): Not on file  Physical Activity:   . Days of Exercise per Week: Not on file  . Minutes of Exercise per Session: Not on file  Stress:   . Feeling of Stress : Not on file  Social Connections:   . Frequency of Communication with Friends and Family: Not on file  . Frequency of Social Gatherings with Friends and Family: Not on file  . Attends Religious Services: Not on file  . Active Member of Clubs or Organizations: Not on file  . Attends Archivist Meetings: Not on file  . Marital Status: Not on file  Intimate Partner Violence:   . Fear of Current or Ex-Partner: Not on file  . Emotionally Abused: Not on file   . Physically Abused: Not on file  . Sexually Abused: Not on file   Family History  Problem Relation Age of Onset  . Heart disease Father 64       Heart disease before age 35  . Heart attack Father   . Hypertension Father   . Hyperlipidemia Father   . Varicose Veins Father   . Stroke Father        ? not sure  . Colon cancer Maternal Aunt   . Colon cancer Maternal Uncle   . Diverticulitis Other   . Heart attack Other    ROS: All systems reviewed and negative except as per HPI.   Current Outpatient Medications  Medication Sig Dispense Refill  . amiodarone (PACERONE) 200 MG tablet Take 1 tab twice a day for 2 weeks then 1 tab daily after that (Patient taking differently: Take 200 mg by mouth See admin instructions. Take 200 mg  tab twice a day for 2 weeks then 200 mg tab daily after that) 45 tablet 4  . apixaban (ELIQUIS) 2.5 MG TABS tablet Take 1 tablet (2.5 mg total) by mouth 2 (two) times daily. 180 tablet 1  . calcium-vitamin D (OSCAL WITH D) 500-200 MG-UNIT per tablet Take 1 tablet by mouth daily.     . Cholecalciferol (VITAMIN D3) 2000 UNITS TABS Take 2,000 Units by mouth daily. 50 mcg    . Coenzyme Q10 (CO Q 10) 100 MG CAPS Take 300 mg by mouth daily.     . fish oil-omega-3 fatty acids 1000 MG capsule Take 1 g by mouth daily.      . furosemide (LASIX) 20 MG tablet Take 3 tablets (60 mg total) by mouth daily. 180 tablet 4  . gabapentin (NEURONTIN) 300 MG capsule Take 300 mg by mouth 2 (two) times daily as needed (Nerve pain).     [name] glipiZIDE (GLUCOTROL) 10 MG tablet Take 10 mg by mouth 2 (two) times daily before a meal.    . metFORMIN (GLUCOPHAGE) 500 MG tablet Take 2 tablets (1,000 mg total) by mouth 2 (two) times daily with a meal.    . metoprolol succinate (TOPROL-XL) 25 MG 24 hr tablet Take 1 tablet (25 mg total) by mouth daily. 90 tablet 3  . Multiple Vitamin (MULTI-DAY VITAMINS PO) Take 1 tablet by mouth daily.     [name] omeprazole (PRILOSEC) 20 MG capsule Take 20 mg by mouth  2 (two) times daily before a meal.     . simvastatin (ZOCOR) 20 MG tablet Take 20 mg by mouth at bedtime.     . Turmeric 450 MG CAPS Take 450  mg by mouth daily.     . vitamin C (ASCORBIC ACID) 250 MG tablet Take 250 mg by mouth 2 (two) times daily.     . vitamin E 1000 UNIT capsule Take 1,000 Units by mouth every other day.     No current facility-administered medications for this encounter.   BP 118/68   Pulse (!) 54   Wt 81.2 kg (179 lb)   SpO2 99%   BMI 24.97 kg/m  General: NAD Neck: JVP 14-16 cm, no thyromegaly or thyroid nodule.  Lungs: Clear to auscultation bilaterally with normal respiratory effort. CV: Nondisplaced PMI.  Heart regular S1/S2, no S3/S4, no murmur.  1+ edema to thighs.  No carotid bruit.  Normal pedal pulses.  Abdomen: Soft, nontender, no hepatosplenomegaly, no distention.  Skin: Intact without lesions or rashes.  Neurologic: Alert and oriented x 3.  Psych: Normal affect. Extremities: No clubbing or cyanosis.  HEENT: Normal.    Assessment/Plan: 1. Chronic systolic CHF: Ischemic cardiomyopathy.  Echo in 8/20 with EF 20-25%, moderately decreased RV systolic function.  St Jude ICD.  NYHA class III symptoms.  He remains significantly volume overloaded on exam.  This is complicated by CKD stage 3 with recent AKI, last creatinine up to 3.72.  I am concerned about possible low output HF with cardiorenal syndrome.   - I am going to set him up for RHC this week.  Will assess filling pressures, but more importantly cardiac output.  If he has low output HF, I will plan to admit him for IV diuresis and start milrinone to facilitate this. Discussed risks/benefits with patient and daughter and he agrees to plan.  - He will stay off Entresto and spironolactone with AKI.  - Wear graded compression stockings during the day.  - Continue Toprol XL 25 mg daily.  - Narrow QRS, not candidate for upgrade to CRT.  - Given significant volume overload, will increase Lasix back to 60 mg  bid for now.  - Low voltage ECG, would consider eventual amyloidosis workup.  2. CAD: S/p CABG 2007, patent grafts on LHC in 2016. No exertional chest pain.  - Given stable CAD and Eliquis use, he is not on ASA.  - Continue statin.  3. Atrial fibrillation: Paroxysmal.  He is now back in NSR post-DCCV.  He feels much better in NSR.   - Continue amiodarone 200 mg daily.  Check LFTs and TSH today.  He will need a regular eye exam while on amiodarone.  4. Right chronic pleural effusion: Only small effusion on recent CXR.  5. CKD stage 3 with recent AKI: BMET today.  As above, concerned for cardiorenal syndrome and plan RHC.  May need milrinone support to facilitate diuresis.   6. Carotid stenosis: Repeat dopplers in 8/21.    [name] 05/21/2019

## 2019-05-22 ENCOUNTER — Inpatient Hospital Stay (HOSPITAL_COMMUNITY)
Admission: RE | Admit: 2019-05-22 | Discharge: 2019-05-25 | DRG: 286 | Disposition: A | Discharge status: Home / Self Care | Payer: PPO | Attending: Cardiology | Admitting: Cardiology

## 2019-05-22 ENCOUNTER — Other Ambulatory Visit: Payer: Self-pay

## 2019-05-22 ENCOUNTER — Inpatient Hospital Stay (HOSPITAL_COMMUNITY): Payer: PPO

## 2019-05-22 ENCOUNTER — Encounter (HOSPITAL_COMMUNITY)
Admission: RE | Disposition: A | Discharge status: Home / Self Care | Payer: Self-pay | Source: Home / Self Care | Attending: Cardiology

## 2019-05-22 DIAGNOSIS — Z87891 Personal history of nicotine dependence: Secondary | ICD-10-CM

## 2019-05-22 DIAGNOSIS — I6521 Occlusion and stenosis of right carotid artery: Secondary | ICD-10-CM | POA: Diagnosis present

## 2019-05-22 DIAGNOSIS — Z79899 Other long term (current) drug therapy: Secondary | ICD-10-CM | POA: Diagnosis not present

## 2019-05-22 DIAGNOSIS — I509 Heart failure, unspecified: Secondary | ICD-10-CM

## 2019-05-22 DIAGNOSIS — Z7984 Long term (current) use of oral hypoglycemic drugs: Secondary | ICD-10-CM | POA: Diagnosis not present

## 2019-05-22 DIAGNOSIS — I255 Ischemic cardiomyopathy: Secondary | ICD-10-CM | POA: Diagnosis present

## 2019-05-22 DIAGNOSIS — E785 Hyperlipidemia, unspecified: Secondary | ICD-10-CM | POA: Diagnosis present

## 2019-05-22 DIAGNOSIS — Z951 Presence of aortocoronary bypass graft: Secondary | ICD-10-CM | POA: Diagnosis not present

## 2019-05-22 DIAGNOSIS — Z20822 Contact with and (suspected) exposure to covid-19: Secondary | ICD-10-CM | POA: Diagnosis present

## 2019-05-22 DIAGNOSIS — I5022 Chronic systolic (congestive) heart failure: Secondary | ICD-10-CM

## 2019-05-22 DIAGNOSIS — Z8249 Family history of ischemic heart disease and other diseases of the circulatory system: Secondary | ICD-10-CM

## 2019-05-22 DIAGNOSIS — E1122 Type 2 diabetes mellitus with diabetic chronic kidney disease: Secondary | ICD-10-CM | POA: Diagnosis present

## 2019-05-22 DIAGNOSIS — Z95810 Presence of automatic (implantable) cardiac defibrillator: Secondary | ICD-10-CM | POA: Diagnosis not present

## 2019-05-22 DIAGNOSIS — Z7901 Long term (current) use of anticoagulants: Secondary | ICD-10-CM | POA: Diagnosis not present

## 2019-05-22 DIAGNOSIS — I2721 Secondary pulmonary arterial hypertension: Secondary | ICD-10-CM | POA: Diagnosis present

## 2019-05-22 DIAGNOSIS — I5043 Acute on chronic combined systolic (congestive) and diastolic (congestive) heart failure: Secondary | ICD-10-CM

## 2019-05-22 DIAGNOSIS — I480 Paroxysmal atrial fibrillation: Secondary | ICD-10-CM | POA: Diagnosis present

## 2019-05-22 DIAGNOSIS — I130 Hypertensive heart and chronic kidney disease with heart failure and stage 1 through stage 4 chronic kidney disease, or unspecified chronic kidney disease: Secondary | ICD-10-CM | POA: Diagnosis present

## 2019-05-22 DIAGNOSIS — N179 Acute kidney failure, unspecified: Secondary | ICD-10-CM | POA: Diagnosis present

## 2019-05-22 DIAGNOSIS — I493 Ventricular premature depolarization: Secondary | ICD-10-CM | POA: Diagnosis not present

## 2019-05-22 DIAGNOSIS — I5023 Acute on chronic systolic (congestive) heart failure: Secondary | ICD-10-CM | POA: Diagnosis present

## 2019-05-22 DIAGNOSIS — I429 Cardiomyopathy, unspecified: Secondary | ICD-10-CM | POA: Diagnosis not present

## 2019-05-22 DIAGNOSIS — D649 Anemia, unspecified: Secondary | ICD-10-CM | POA: Diagnosis not present

## 2019-05-22 DIAGNOSIS — N183 Chronic kidney disease, stage 3 unspecified: Secondary | ICD-10-CM | POA: Diagnosis present

## 2019-05-22 DIAGNOSIS — I2510 Atherosclerotic heart disease of native coronary artery without angina pectoris: Secondary | ICD-10-CM | POA: Diagnosis present

## 2019-05-22 DIAGNOSIS — I10 Essential (primary) hypertension: Secondary | ICD-10-CM | POA: Diagnosis not present

## 2019-05-22 HISTORY — PX: RIGHT HEART CATH: CATH118263

## 2019-05-22 LAB — CBC WITH DIFFERENTIAL/PLATELET
Abs Immature Granulocytes: 0.03 10*3/uL (ref 0.00–0.07)
Basophils Absolute: 0.1 10*3/uL (ref 0.0–0.1)
Basophils Relative: 1 %
Eosinophils Absolute: 0 10*3/uL (ref 0.0–0.5)
Eosinophils Relative: 0 %
HCT: 37.3 % — ABNORMAL LOW (ref 39.0–52.0)
Hemoglobin: 11.5 g/dL — ABNORMAL LOW (ref 13.0–17.0)
Immature Granulocytes: 0 %
Lymphocytes Relative: 9 %
Lymphs Abs: 0.7 10*3/uL (ref 0.7–4.0)
MCH: 29 pg (ref 26.0–34.0)
MCHC: 30.8 g/dL (ref 30.0–36.0)
MCV: 94.2 fL (ref 80.0–100.0)
Monocytes Absolute: 0.6 10*3/uL (ref 0.1–1.0)
Monocytes Relative: 8 %
Neutro Abs: 6.2 10*3/uL (ref 1.7–7.7)
Neutrophils Relative %: 82 %
Platelets: 189 10*3/uL (ref 150–400)
RBC: 3.96 MIL/uL — ABNORMAL LOW (ref 4.22–5.81)
RDW: 17.9 % — ABNORMAL HIGH (ref 11.5–15.5)
WBC: 7.6 10*3/uL (ref 4.0–10.5)
nRBC: 0 % (ref 0.0–0.2)

## 2019-05-22 LAB — POCT I-STAT EG7
Acid-Base Excess: 3 mmol/L — ABNORMAL HIGH (ref 0.0–2.0)
Acid-Base Excess: 3 mmol/L — ABNORMAL HIGH (ref 0.0–2.0)
Bicarbonate: 27.7 mmol/L (ref 20.0–28.0)
Bicarbonate: 28 mmol/L (ref 20.0–28.0)
Calcium, Ion: 1.17 mmol/L (ref 1.15–1.40)
Calcium, Ion: 1.17 mmol/L (ref 1.15–1.40)
HCT: 34 % — ABNORMAL LOW (ref 39.0–52.0)
HCT: 35 % — ABNORMAL LOW (ref 39.0–52.0)
Hemoglobin: 11.6 g/dL — ABNORMAL LOW (ref 13.0–17.0)
Hemoglobin: 11.9 g/dL — ABNORMAL LOW (ref 13.0–17.0)
O2 Saturation: 55 %
O2 Saturation: 58 %
Potassium: 4.1 mmol/L (ref 3.5–5.1)
Potassium: 4.1 mmol/L (ref 3.5–5.1)
Sodium: 137 mmol/L (ref 135–145)
Sodium: 137 mmol/L (ref 135–145)
TCO2: 29 mmol/L (ref 22–32)
TCO2: 29 mmol/L (ref 22–32)
pCO2, Ven: 43.3 mmHg — ABNORMAL LOW (ref 44.0–60.0)
pCO2, Ven: 43.4 mmHg — ABNORMAL LOW (ref 44.0–60.0)
pH, Ven: 7.413 (ref 7.250–7.430)
pH, Ven: 7.419 (ref 7.250–7.430)
pO2, Ven: 29 mmHg — CL (ref 32.0–45.0)
pO2, Ven: 30 mmHg — CL (ref 32.0–45.0)

## 2019-05-22 LAB — COMPREHENSIVE METABOLIC PANEL
ALT: 19 U/L (ref 0–44)
AST: 25 U/L (ref 15–41)
Albumin: 3.6 g/dL (ref 3.5–5.0)
Alkaline Phosphatase: 52 U/L (ref 38–126)
Anion gap: 17 — ABNORMAL HIGH (ref 5–15)
BUN: 70 mg/dL — ABNORMAL HIGH (ref 8–23)
CO2: 24 mmol/L (ref 22–32)
Calcium: 9.5 mg/dL (ref 8.9–10.3)
Chloride: 96 mmol/L — ABNORMAL LOW (ref 98–111)
Creatinine, Ser: 3.12 mg/dL — ABNORMAL HIGH (ref 0.61–1.24)
GFR calc Af Amer: 21 mL/min — ABNORMAL LOW (ref >60)
GFR calc non Af Amer: 18 mL/min — ABNORMAL LOW (ref >60)
Glucose, Bld: 115 mg/dL — ABNORMAL HIGH (ref 70–99)
Potassium: 4.3 mmol/L (ref 3.5–5.1)
Sodium: 137 mmol/L (ref 135–145)
Total Bilirubin: 1.2 mg/dL (ref 0.3–1.2)
Total Protein: 7.4 g/dL (ref 6.5–8.1)

## 2019-05-22 LAB — MRSA PCR SCREENING: MRSA by PCR: NEGATIVE

## 2019-05-22 LAB — BRAIN NATRIURETIC PEPTIDE: B Natriuretic Peptide: 2794.1 pg/mL — ABNORMAL HIGH (ref 0.0–100.0)

## 2019-05-22 LAB — MAGNESIUM: Magnesium: 2 mg/dL (ref 1.7–2.4)

## 2019-05-22 LAB — GLUCOSE, CAPILLARY: Glucose-Capillary: 185 mg/dL — ABNORMAL HIGH (ref 70–99)

## 2019-05-22 LAB — TSH: TSH: 3.789 u[IU]/mL (ref 0.350–4.500)

## 2019-05-22 SURGERY — RIGHT HEART CATH
Anesthesia: LOCAL

## 2019-05-22 MED ORDER — FUROSEMIDE 10 MG/ML IJ SOLN
12.0000 mg/h | INTRAVENOUS | Status: DC
  Administered 2019-05-22 – 2019-05-23 (×2): 12 mg/h via INTRAVENOUS
  Filled 2019-05-22 (×3): qty 25

## 2019-05-22 MED ORDER — MILRINONE LACTATE IN DEXTROSE 20-5 MG/100ML-% IV SOLN
0.2500 ug/kg/min | INTRAVENOUS | Status: DC
  Administered 2019-05-22: 0.25 ug/kg/min via INTRAVENOUS
  Filled 2019-05-22 (×3): qty 100

## 2019-05-22 MED ORDER — GABAPENTIN 300 MG PO CAPS: 300.0000 mg | ORAL_CAPSULE | Freq: Two times a day (BID) | ORAL | Status: DC | PRN

## 2019-05-22 MED ORDER — SIMVASTATIN 20 MG PO TABS
20.0000 mg | ORAL_TABLET | Freq: Every day | ORAL | Status: DC
  Administered 2019-05-22 – 2019-05-24 (×3): 20 mg via ORAL
  Filled 2019-05-22 (×3): qty 1

## 2019-05-22 MED ORDER — INSULIN ASPART 100 UNIT/ML ~~LOC~~ SOLN
0.0000 [IU] | Freq: Three times a day (TID) | SUBCUTANEOUS | Status: DC
  Administered 2019-05-23 (×2): 3 [IU] via SUBCUTANEOUS
  Administered 2019-05-24: 11 [IU] via SUBCUTANEOUS
  Administered 2019-05-24: 3 [IU] via SUBCUTANEOUS

## 2019-05-22 MED ORDER — PANTOPRAZOLE SODIUM 40 MG PO TBEC
40.0000 mg | DELAYED_RELEASE_TABLET | Freq: Every day | ORAL | Status: DC
  Administered 2019-05-23 – 2019-05-25 (×3): 40 mg via ORAL
  Filled 2019-05-22 (×4): qty 1

## 2019-05-22 MED ORDER — ACETAMINOPHEN 325 MG PO TABS: 650.0000 mg | ORAL_TABLET | ORAL | Status: DC | PRN

## 2019-05-22 MED ORDER — METOPROLOL SUCCINATE ER 25 MG PO TB24
25.0000 mg | ORAL_TABLET | Freq: Every day | ORAL | Status: DC
  Administered 2019-05-22 – 2019-05-25 (×4): 25 mg via ORAL
  Filled 2019-05-22 (×4): qty 1

## 2019-05-22 MED ORDER — ASPIRIN 81 MG PO CHEW
81.0000 mg | CHEWABLE_TABLET | ORAL | Status: AC
  Administered 2019-05-22: 81 mg via ORAL
  Filled 2019-05-22: qty 1

## 2019-05-22 MED ORDER — AMIODARONE HCL 200 MG PO TABS
200.0000 mg | ORAL_TABLET | Freq: Two times a day (BID) | ORAL | Status: DC
  Administered 2019-05-22: 200 mg via ORAL
  Filled 2019-05-22: qty 1

## 2019-05-22 MED ORDER — SODIUM CHLORIDE 0.9 % IV SOLN: INTRAVENOUS | Status: DC

## 2019-05-22 MED ORDER — SODIUM CHLORIDE 0.9% FLUSH
3.0000 mL | Freq: Two times a day (BID) | INTRAVENOUS | Status: DC
  Administered 2019-05-22 – 2019-05-25 (×5): 3 mL via INTRAVENOUS

## 2019-05-22 MED ORDER — SODIUM CHLORIDE 0.9% FLUSH: 3.0000 mL | INTRAVENOUS | Status: DC | PRN

## 2019-05-22 MED ORDER — APIXABAN 2.5 MG PO TABS
2.5000 mg | ORAL_TABLET | Freq: Two times a day (BID) | ORAL | Status: DC
  Administered 2019-05-22 – 2019-05-25 (×6): 2.5 mg via ORAL
  Filled 2019-05-22 (×6): qty 1

## 2019-05-22 MED ORDER — FUROSEMIDE 10 MG/ML IJ SOLN
80.0000 mg | Freq: Once | INTRAMUSCULAR | Status: AC
  Administered 2019-05-22: 80 mg via INTRAVENOUS
  Filled 2019-05-22: qty 8

## 2019-05-22 MED ORDER — HEPARIN (PORCINE) IN NACL 1000-0.9 UT/500ML-% IV SOLN
INTRAVENOUS | Status: DC | PRN
  Administered 2019-05-22: 500 mL

## 2019-05-22 MED ORDER — INSULIN ASPART 100 UNIT/ML ~~LOC~~ SOLN
0.0000 [IU] | Freq: Every day | SUBCUTANEOUS | Status: DC
  Administered 2019-05-24: 3 [IU] via SUBCUTANEOUS

## 2019-05-22 MED ORDER — SODIUM CHLORIDE 0.9 % IV SOLN: 250.0000 mL | INTRAVENOUS | Status: DC | PRN

## 2019-05-22 MED ORDER — HEPARIN (PORCINE) IN NACL 1000-0.9 UT/500ML-% IV SOLN
INTRAVENOUS | Status: AC
  Filled 2019-05-22: qty 500

## 2019-05-22 MED ORDER — LIDOCAINE HCL (PF) 1 % IJ SOLN
INTRAMUSCULAR | Status: AC
  Filled 2019-05-22: qty 30

## 2019-05-22 MED ORDER — ONDANSETRON HCL 4 MG/2ML IJ SOLN: 4.0000 mg | Freq: Four times a day (QID) | INTRAMUSCULAR | Status: DC | PRN

## 2019-05-22 MED ORDER — LIDOCAINE HCL (PF) 1 % IJ SOLN
INTRAMUSCULAR | Status: DC | PRN
  Administered 2019-05-22: 2 mL

## 2019-05-22 SURGICAL SUPPLY — 8 items
CATH SWAN GANZ 7F STRAIGHT (CATHETERS) ×1 IMPLANT
GLIDESHEATH SLENDER 7FR .021G (SHEATH) ×1 IMPLANT
GUIDEWIRE .025 260CM (WIRE) ×1 IMPLANT
KIT HEART LEFT (KITS) ×2 IMPLANT
PACK CARDIAC CATHETERIZATION (CUSTOM PROCEDURE TRAY) ×2 IMPLANT
SHEATH GLIDE SLENDER 4/5FR (SHEATH) IMPLANT
SHEATH PROBE COVER 6X72 (BAG) ×1 IMPLANT
TRANSDUCER W/STOPCOCK (MISCELLANEOUS) ×2 IMPLANT

## 2019-05-22 NOTE — Plan of Care (Signed)

## 2019-05-22 NOTE — H&P (Signed)
Advanced Heart Failure Team History and Physical Note   PCP:  Shirline Frees, MD  PCP-Cardiology: [name], MD     Reason for Admission:    HPI:    84 y.o. with h/o CAD s/p CABG, ischemic cardiomyopathy, and diabetes is admitted post-cath for diuresis in the setting of cardiorenal syndrome.    Patient had CABG in 2007.  Last cath in 2016 showed all 4 grafts patent.  Echo was 25-30% in 2016.  Most recent echo in 8/20 showed EF 20-25% with moderate RV dysfunction.  He has had a recurrent right pleural effusion with thoracenteses in 5/19 and 8/20.  He thinks that he has been in atrial fibrillation persistently for about a year.  He used to take amiodarone but stopped it because he "felt bad."  However, he does not think that stopping amiodarone made him feel any better.  Creatinine has been elevated, CKD stage 3.   At initial appointment, I thought he was volume overloaded and increased Lasix, but his creatinine steadily rose (2.55 => 3.13 => 3.72). I stopped Entresto and he never started spironolactone.  Lasix was cut back to 60 mg daily. He says that during this time, he got his COVID vaccination and after that, he became severely nauseated for a number of days and ate/drank very little.  This eventually resolved.   On 05/11/19, I cardioverted him back to NSR.    I saw him earlier this week for followup.  He was still markedly volume overloaded though he felt better in NSR.  Creatinine was 3.06.  Given concern for cardiorenal syndrome and low cardiac output, I brought him in for RHC (see below).  Based on cath, I admitted him for diuresis while on milrinone.   RHC Procedural Findings: Hemodynamics (mmHg) RA mean 18 RV 72/16 PA 71/27, mean 44 PCWP mean 24 Oxygen saturations: PA 56% AO 97% Cardiac Output (Fick) 4.48  Cardiac Index (Fick) 2.23 PVR 4.4 WU Cardiac Output (Thermo) 4.34 Cardiac Index (Thermo) 2.16  PVR 4.6 WU PAPI 2.4  Review of Systems: All systems  reviewed and negative except as per HPI.   Home Medications Prior to Admission medications   Medication Sig Start Date End Date Taking? Authorizing Provider  amiodarone (PACERONE) 200 MG tablet Take 1 tab twice a day for 2 weeks then 1 tab daily after that Patient taking differently: Take 200 mg by mouth See admin instructions. Take 200 mg  tab twice a day for 2 weeks then 200 mg tab daily after that 04/28/19  Yes Larey Dresser, MD  apixaban (ELIQUIS) 2.5 MG TABS tablet Take 1 tablet (2.5 mg total) by mouth 2 (two) times daily. 03/31/19  Yes Burnell Blanks, MD  calcium-vitamin D (OSCAL WITH D) 500-200 MG-UNIT per tablet Take 1 tablet by mouth daily.    Yes [provider]  Cholecalciferol (VITAMIN D3) 2000 UNITS TABS Take 2,000 Units by mouth daily. 50 mcg   Yes [provider]  Coenzyme Q10 (CO Q 10) 100 MG CAPS Take 300 mg by mouth daily.    Yes [provider]  fish oil-omega-3 fatty acids 1000 MG capsule Take 1 g by mouth daily.     Yes [provider]  furosemide (LASIX) 20 MG tablet Take 3 tablets (60 mg total) by mouth daily. 05/20/19  Yes Larey Dresser, MD  gabapentin (NEURONTIN) 300 MG capsule Take 300 mg by mouth 2 (two) times daily as needed (Nerve pain).  02/09/19  Yes [provider]  glipiZIDE (GLUCOTROL) 10 MG tablet Take 10 mg by mouth 2 (two) times daily before a meal.   Yes [provider]  metFORMIN (GLUCOPHAGE) 500 MG tablet Take 2 tablets (1,000 mg total) by mouth 2 (two) times daily with a meal. 06/27/14  Yes Kilroy, Luke K, PA-C  metoprolol succinate (TOPROL-XL) 25 MG 24 hr tablet Take 1 tablet (25 mg total) by mouth daily. 08/13/14  Yes Burnell Blanks, MD  Multiple Vitamin (MULTI-DAY VITAMINS PO) Take 1 tablet by mouth daily.    Yes [provider]  omeprazole (PRILOSEC) 20 MG capsule Take 20 mg by mouth 2 (two) times daily before a meal.  11/15/10  Yes [provider]  simvastatin  (ZOCOR) 20 MG tablet Take 20 mg by mouth at bedtime.  10/12/10  Yes [provider]  Turmeric 450 MG CAPS Take 450 mg by mouth daily.    Yes [provider]  vitamin C (ASCORBIC ACID) 250 MG tablet Take 250 mg by mouth 2 (two) times daily.    Yes [provider]  vitamin E 1000 UNIT capsule Take 1,000 Units by mouth every other day.   Yes [provider]    Past Medical History: 1. Type 2 diabetes  2. HTN 3. Hyperlipidemia 4. CAD: CABG x 4 in 2007 with LIMA-LAD, SVG-OM, sequential SVG-PLV/PDA.   - LHC (2016): Patent grafts.  5. Atrial fibrillation: Persistent.  - DCCV to NSR 2/21.  6. CKD stage 3.  7. Carotid stenosis:  - Carotid dopplers (8/20): 60-79% RICA stenosis.  8. Chronic systolic CHF: Ischemic cardiomyopathy.    - Echo (2016): EF 25-30% - Echo (2018): EF 25% - Echo (8/20): EF 20-25%, moderately decreased RV systolic function, severe LAE.  - St Jude ICD.  9. Pleural effusion on right: s/p thoracentesis in 5/19 and 8/20.   Past Surgical History: Past Surgical History:  Procedure Laterality Date  . CARDIAC CATHETERIZATION  06/24/2014  . CARDIOVERSION [name] 05/11/2019   Procedure: CARDIOVERSION;  Surgeon: Larey Dresser, MD;  Location: [facility] ENDOSCOPY;  Service: Cardiovascular;  Laterality: [name];  . CATARACT EXTRACTION  2012   right eye  . COLONOSCOPY    . CORONARY ARTERY BYPASS GRAFT      quad bypass/2007  . EYE SURGERY    . ICD IMPLANT [name] 06/11/2017   Procedure: ICD IMPLANT;  Surgeon: Constance Haw, MD;  Location: [place] [facility] CV LAB;  Service: Cardiovascular;  Laterality: [name];  . IR THORACENTESIS ASP PLEURAL SPACE W/IMG GUIDE  10/28/2018  . LEFT HEART CATHETERIZATION WITH CORONARY/GRAFT ANGIOGRAM [name] 06/24/2014   Procedure: LEFT HEART CATHETERIZATION WITH Beatrix Fetters;  Surgeon: Burnell Blanks, MD;  Location: [facility] CATH LAB;  Service: Cardiovascular;  Laterality: [name];  . POLYPECTOMY    . TONSILLECTOMY      Family  History:  Family History  Problem Relation Age of Onset  . Heart disease Father 33       Heart disease before age 44  . Heart attack Father   . Hypertension Father   . Hyperlipidemia Father   . Varicose Veins Father   . Stroke Father        ? not sure  . Colon cancer Maternal Aunt   . Colon cancer Maternal Uncle   . Diverticulitis Other   . Heart attack Other     Social History: Social History   Socioeconomic History  . Marital status: Married    Spouse name: Not on file  . Number  of children: 2  . Years of education: Not on file  . Highest education level: Not on file  Occupational History  . Occupation: Horticulturist, commercial: RETIRED  Tobacco Use  . Smoking status: Former Smoker    Types: Cigarettes    Quit date: 03/26/1970    Years since quitting: 49.1  . Smokeless tobacco: Never Used  Substance and Sexual Activity  . Alcohol use: Yes    Alcohol/week: 1.0 standard drinks    Types: 1 Glasses of wine per week  . Drug use: No  . Sexual activity: Not on file  Other Topics Concern  . Not on file  Social History Narrative  . Not on file   Social Determinants of Health   Financial Resource Strain:   . Difficulty of Paying Living Expenses: Not on file  Food Insecurity:   . Worried About Charity fundraiser in the Last Year: Not on file  . Ran Out of Food in the Last Year: Not on file  Transportation Needs:   . Lack of Transportation (Medical): Not on file  . Lack of Transportation (Non-Medical): Not on file  Physical Activity:   . Days of Exercise per Week: Not on file  . Minutes of Exercise per Session: Not on file  Stress:   . Feeling of Stress : Not on file  Social Connections:   . Frequency of Communication with Friends and Family: Not on file  . Frequency of Social Gatherings with Friends and Family: Not on file  . Attends Religious Services: Not on file  . Active Member of Clubs or Organizations: Not on file  . Attends Archivist  Meetings: Not on file  . Marital Status: Not on file    Allergies:  No Known Allergies  Objective:    Vital Signs:   Temp:  [97.6 F (36.4 C)-98 F (36.7 C)] 97.6 F (36.4 C) (02/26 1420) Pulse Rate:  [64-73] 73 (02/26 1600) Resp:  [12-19] 15 (02/26 1600) BP: (134-159)/(64-91) 137/64 (02/26 1600) SpO2:  [95 %-100 %] 100 % (02/26 1600) Weight:  [79.1 kg-81.2 kg] 79.1 kg (02/26 1420) Last BM Date: 05/21/19 Filed Weights   05/22/19 1106 05/22/19 1420  Weight: 81.2 kg 79.1 kg     Physical Exam     General:  Well appearing. No respiratory difficulty HEENT: Normal Neck: Supple. JVP 16 cm. Carotids 2+ bilat; no bruits. No lymphadenopathy or thyromegaly appreciated. Cor: PMI nondisplaced. Regular rate & rhythm. No rubs, gallops or murmurs. Lungs: Clear Abdomen: Soft, nontender, nondistended. No hepatosplenomegaly. No bruits or masses. Good bowel sounds. Extremities: No cyanosis, clubbing, rash. 2+ edema to thighs.  Neuro: Alert & oriented x 3, cranial nerves grossly intact. moves all 4 extremities w/o difficulty. Affect pleasant.   Telemetry   NSR 70s (personally reviewed)  Labs     Basic Metabolic Panel: Recent Labs  Lab 05/20/19 1209 05/22/19 1334 05/22/19 1335 05/22/19 1504  NA 137 137 137 137  K 4.9 4.1 4.1 4.3  CL 99  --   --  96*  CO2 24  --   --  24  GLUCOSE 173*  --   --  115*  BUN 67*  --   --  70*  CREATININE 3.06*  --   --  3.12*  CALCIUM 9.4  --   --  9.5  MG  --   --   --  2.0    Liver Function Tests: Recent Labs  Lab 05/20/19 1209  05/22/19 1504  AST 23 25  ALT 20 19  ALKPHOS 60 52  BILITOT 0.9 1.2  PROT 7.0 7.4  ALBUMIN 3.5 3.6   No results for input(s): LIPASE, AMYLASE in the last 168 hours. No results for input(s): AMMONIA in the last 168 hours.  CBC: Recent Labs  Lab 05/20/19 1209 05/22/19 1334 05/22/19 1335 05/22/19 1504  WBC 8.7  --   --  7.6  NEUTROABS  --   --   --  6.2  HGB 10.7* 11.6* 11.9* 11.5*  HCT 34.5* 34.0*  35.0* 37.3*  MCV 92.7  --   --  94.2  PLT 239  --   --  189    Cardiac Enzymes: No results for input(s): CKTOTAL, CKMB, CKMBINDEX, TROPONINI in the last 168 hours.  BNP: BNP (last 3 results) Recent Labs    05/22/19 1504  BNP 2,794.1*    ProBNP (last 3 results) Recent Labs    10/21/18 1639 03/06/19 0943 03/12/19 1228  PROBNP 6,802* 12,634* 7,736*     CBG: No results for input(s): GLUCAP in the last 168 hours.  Coagulation Studies: No results for input(s): LABPROT, INR in the last 72 hours.  Imaging: DG Chest 2 View  Result Date: 05/22/2019 CLINICAL DATA:  CHF. EXAM: CHEST - 2 VIEW COMPARISON:  04/28/2019.  PET-CT 12/18/2019. FINDINGS: Cardiac pacer stable position. Prior CABG. Stable cardiomegaly. No pulmonary venous congestion. Persistent right base atelectasis/infiltrate and small right pleural effusion, no interim change. Linear densities in again noted over the right chest with lung markings distally. These linear densities are most likely related to skin folds. No pneumothorax visualized. IMPRESSION: 1. Persistent right base atelectasis/infiltrate and small right pleural effusion. No interim change. 2. Cardiac pacer stable position. Prior CABG. Stable cardiomegaly. Electronically Signed   By: Marcello Moores  Register   On: 05/22/2019 15:35   CARDIAC CATHETERIZATION  Result Date: 05/22/2019 1. 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 2. Low but not markedly low cardiac output. 3. Elevated right and left heart filling pressures.       Assessment/Plan   1. Acute on chronic systolic CHF: Ischemic cardiomyopathy.  Echo in 8/20 with EF 20-25%, moderately decreased RV systolic function.  St Jude ICD.  NYHA class III symptoms.  He remains significantly volume overloaded on exam.  This is complicated by CKD stage 3 with recent AKI, last creatinine 3.  I was concerned about possible low output HF with cardiorenal syndrome.  Therefore, RHC was done today.  This  showed elevated right and left heart filling pressure with mixed pulmonary venous/pulmonary arterial hypertension.  Cardiac output was low, but not markedly low.  I admitted him today for IV diuresis with use of milrinone.  - Start milrinone 0.25 mcg/kg/min to see if this will allow [place] to diurese him with stability in his renal function.  - Lasix gtt 12 mg/hr.  - Follow renal function closely.  - He will stay off Entresto and spironolactone with AKI.  - Place Unna boots.   - Continue Toprol XL 25 mg daily.  - Narrow QRS, not candidate for upgrade to CRT.  - Low voltage ECG, will get PYP scan while inpatient.  Check myeloma panel and urine immunofixation.   - Age and renal function will probably rule out LVAD placement.  2. CAD: S/p CABG 2007, patent grafts on LHC in 2016. No exertional chest pain.  - Given stable CAD and Eliquis use, he is not on ASA.  - Continue statin.  3. Atrial fibrillation:  Paroxysmal.  He is now back in NSR post-DCCV.  He feels much better in NSR.   - Continue amiodarone but will increase to 200 mg bid while on milrinone.   4. Right chronic pleural effusion: Only small effusion on recent CXR.  5. CKD stage 3 with recent AKI: BMET today.  Suspect cardiorenal syndrome.  Using milrinone to facilitate diuresis.   [name], MD 05/22/2019, 5:49 PM  Advanced Heart Failure Team Pager [phone] (M-F; 7a - 4p)  Please contact [facility] for night-coverage after hours (4p -7a ) and weekends on [email or web address]

## 2019-05-22 NOTE — Interval H&P Note (Signed)
History and Physical Interval Note:  05/22/2019 1:10 PM  [name].  has presented today for surgery, with the diagnosis of heart failure.  The various methods of treatment have been discussed with the patient and family. After consideration of risks, benefits and other options for treatment, the patient has consented to  Procedure(s): RIGHT HEART CATH ([name]) as a surgical intervention.  The patient's history has been reviewed, patient examined, no change in status, stable for surgery.  I have reviewed the patient's chart and labs.  Questions were answered to the patient's satisfaction.     Tonny Isensee [employer]

## 2019-05-23 ENCOUNTER — Inpatient Hospital Stay (HOSPITAL_COMMUNITY): Payer: PPO

## 2019-05-23 LAB — HEMOGLOBIN A1C
Hgb A1c MFr Bld: 7.6 % — ABNORMAL HIGH (ref 4.8–5.6)
Mean Plasma Glucose: 171.42 mg/dL

## 2019-05-23 LAB — BASIC METABOLIC PANEL
Anion gap: 13 (ref 5–15)
BUN: 71 mg/dL — ABNORMAL HIGH (ref 8–23)
CO2: 28 mmol/L (ref 22–32)
Calcium: 8.9 mg/dL (ref 8.9–10.3)
Chloride: 94 mmol/L — ABNORMAL LOW (ref 98–111)
Creatinine, Ser: 3.17 mg/dL — ABNORMAL HIGH (ref 0.61–1.24)
GFR calc Af Amer: 20 mL/min — ABNORMAL LOW (ref >60)
GFR calc non Af Amer: 17 mL/min — ABNORMAL LOW (ref >60)
Glucose, Bld: 157 mg/dL — ABNORMAL HIGH (ref 70–99)
Potassium: 3.6 mmol/L (ref 3.5–5.1)
Sodium: 135 mmol/L (ref 135–145)

## 2019-05-23 LAB — CBC WITH DIFFERENTIAL/PLATELET
Abs Immature Granulocytes: 0.02 K/uL (ref 0.00–0.07)
Basophils Absolute: 0 K/uL (ref 0.0–0.1)
Basophils Relative: 1 %
Eosinophils Absolute: 0.1 K/uL (ref 0.0–0.5)
Eosinophils Relative: 2 %
HCT: 28.5 % — ABNORMAL LOW (ref 39.0–52.0)
Hemoglobin: 9.5 g/dL — ABNORMAL LOW (ref 13.0–17.0)
Immature Granulocytes: 0 %
Lymphocytes Relative: 9 %
Lymphs Abs: 0.6 K/uL — ABNORMAL LOW (ref 0.7–4.0)
MCH: 28.5 pg (ref 26.0–34.0)
MCHC: 33.3 g/dL (ref 30.0–36.0)
MCV: 85.6 fL (ref 80.0–100.0)
Monocytes Absolute: 0.6 K/uL (ref 0.1–1.0)
Monocytes Relative: 10 %
Neutro Abs: 5 K/uL (ref 1.7–7.7)
Neutrophils Relative %: 78 %
Platelets: 191 K/uL (ref 150–400)
RBC: 3.33 MIL/uL — ABNORMAL LOW (ref 4.22–5.81)
RDW: 17.1 % — ABNORMAL HIGH (ref 11.5–15.5)
WBC: 6.4 K/uL (ref 4.0–10.5)
nRBC: 0 % (ref 0.0–0.2)

## 2019-05-23 LAB — GLUCOSE, CAPILLARY
Glucose-Capillary: 111 mg/dL — ABNORMAL HIGH (ref 70–99)
Glucose-Capillary: 196 mg/dL — ABNORMAL HIGH (ref 70–99)
Glucose-Capillary: 163 mg/dL — ABNORMAL HIGH (ref 70–99)
Glucose-Capillary: 160 mg/dL — ABNORMAL HIGH (ref 70–99)

## 2019-05-23 LAB — MAGNESIUM: Magnesium: 1.8 mg/dL (ref 1.7–2.4)

## 2019-05-23 MED ORDER — AMIODARONE HCL IN DEXTROSE 360-4.14 MG/200ML-% IV SOLN
30.0000 mg/h | INTRAVENOUS | Status: DC
  Administered 2019-05-23 – 2019-05-24 (×3): 30 mg/h via INTRAVENOUS
  Filled 2019-05-23 (×4): qty 200

## 2019-05-23 MED ORDER — MAGNESIUM SULFATE 2 GM/50ML IV SOLN
2.0000 g | Freq: Once | INTRAVENOUS | Status: AC
  Administered 2019-05-23: 2 g via INTRAVENOUS
  Filled 2019-05-23: qty 50

## 2019-05-23 MED ORDER — POTASSIUM CHLORIDE CRYS ER 20 MEQ PO TBCR
40.0000 meq | EXTENDED_RELEASE_TABLET | Freq: Two times a day (BID) | ORAL | Status: AC
  Administered 2019-05-23 (×2): 40 meq via ORAL
  Filled 2019-05-23 (×2): qty 2

## 2019-05-23 MED ORDER — TECHNETIUM TC 99M PYROPHOSPHATE
21.5000 | Freq: Once | INTRAVENOUS | Status: AC | PRN
  Administered 2019-05-23: 21.5 via INTRAVENOUS
  Filled 2019-05-23: qty 22

## 2019-05-23 NOTE — Evaluation (Signed)
Physical Therapy Evaluation Patient Details Name: [name]. MRN: [id number] DOB: [birth year] Today's Date: 05/23/2019   History of Present Illness  84 y.o. with h/o CAD s/p CABG, ischemic cardiomyopathy, and diabetes is admitted post-cath for diuresis in the setting of cardiorenal syndrome.  Clinical Impression  Pt presents to PT with deficits in strength, power, balance, gait, and endurance. Pt is able to mobilize at a supervision level currently, but demonstrates reduced gait speed, and is mildly unsteady on his feet without UE support. PT anticipates these balance will be mitigated by aggressive mobilization and ambulating at least 3 times per day with supervision of staff or family. PT recommending no PT or DME follow-up at time of discharge. Pt will benefit from stair negotiation assessment next session.    Follow Up Recommendations No PT follow up    Equipment Recommendations  None recommended by PT    Recommendations for Other Services       Precautions / Restrictions Precautions Precautions: Fall Restrictions Weight Bearing Restrictions: No      Mobility  Bed Mobility Overal bed mobility: Modified Independent             General bed mobility comments: increased time and use of rail  Transfers Overall transfer level: Needs assistance Equipment used: None Transfers: Sit to/from Stand Sit to Stand: Supervision            Ambulation/Gait Ambulation/Gait assistance: Supervision Gait Distance (Feet): 150 Feet Assistive device: None Gait Pattern/deviations: Step-to pattern;Wide base of support Gait velocity: reduced gait speed Gait velocity interpretation: 1.31 - 2.62 ft/sec, indicative of limited community ambulator General Gait Details: increased lateral sway  Stairs            Wheelchair Mobility    Modified Rankin (Stroke Patients Only)       Balance Overall balance assessment: Needs assistance Sitting-balance support: No upper  extremity supported;Feet supported Sitting balance-Leahy Scale: Normal     Standing balance support: No upper extremity supported Standing balance-Leahy Scale: Good Standing balance comment: supervision without UE support                             Pertinent Vitals/Pain Pain Assessment: No/denies pain    Home Living Family/patient expects to be discharged to:: Private residence Living Arrangements: Children;Spouse/significant other(son is disabled, has hired care) Available Help at Discharge: Family;Available 24 hours/day Type of Home: House Home Access: Ramped entrance     Home Layout: Two level Home Equipment: Wheelchair - power;Wheelchair - [employer] - 2 wheels;Cane - single point(mechanical lift)      Prior Function Level of Independence: Independent               Hand Dominance        Extremity/Trunk Assessment   Upper Extremity Assessment Upper Extremity Assessment: Overall WFL for tasks assessed    Lower Extremity Assessment Lower Extremity Assessment: Generalized weakness    Cervical / Trunk Assessment Cervical / Trunk Assessment: Normal  Communication   Communication: No difficulties  Cognition Arousal/Alertness: Awake/alert Behavior During Therapy: WFL for tasks assessed/performed Overall Cognitive Status: Within Functional Limits for tasks assessed                                        General Comments General comments (skin integrity, edema, etc.): VSS on RA, pt saturating in mid 90s, asymptomatic  Exercises     Assessment/Plan    PT Assessment Patient needs continued PT services  PT Problem List Decreased strength;Decreased activity tolerance;Decreased balance       PT Treatment Interventions DME instruction;Gait training;Stair training;Functional mobility training;Therapeutic activities;Therapeutic exercise;Balance training;Neuromuscular re-education;Patient/family education    PT Goals (Current  goals can be found in the Care Plan section)  Acute Rehab PT Goals Patient Stated Goal: To return to independence PT Goal Formulation: With patient Time For Goal Achievement: 06/06/19 Potential to Achieve Goals: Good    Frequency Min 3X/week   Barriers to discharge        Co-evaluation               AM-PAC PT "6 Clicks" Mobility  Outcome Measure Help needed turning from your back to your side while in a flat bed without using bedrails?: None Help needed moving from lying on your back to sitting on the side of a flat bed without using bedrails?: None Help needed moving to and from a bed to a chair (including a wheelchair)?: None Help needed standing up from a chair using your arms (e.g., wheelchair or bedside chair)?: None Help needed to walk in hospital room?: None Help needed climbing 3-5 steps with a railing? : A Little 6 Click Score: 23    End of Session   Activity Tolerance: Patient tolerated treatment well Patient left: in chair;with call bell/phone within reach Nurse Communication: Mobility status PT Visit Diagnosis: Unsteadiness on feet (R26.81)    Time: [id number] PT Time Calculation (min) (ACUTE ONLY): 13 min   Charges:   PT Evaluation $PT Eval Low Complexity: [place], PT, DPT Acute Rehabilitation Pager: [phone]   Zenaida Niece 05/23/2019, 12:57 PM

## 2019-05-23 NOTE — Progress Notes (Signed)
Patient ID: [name]., male   DOB: 1935/06/25, 84 y.o.   MRN: [id number]     Advanced Heart Failure Rounding Note  PCP-Cardiologist: [name], MD   Subjective:    Good diuresis yesterday, weight down significantly.  Creatinine stable at 3.17. Breathing is better, cough has resolved.  Remains in NSR but noted increased PVCs.    Objective:   Weight Range: 74.8 kg Body mass index is 23 kg/m.   Vital Signs:   Temp:  [97.6 F (36.4 C)-98.4 F (36.9 C)] 97.8 F (36.6 C) (02/27 0324) Pulse Rate:  [63-73] 65 (02/27 0324) Resp:  [10-20] 10 (02/27 0324) BP: (110-159)/(51-91) 110/51 (02/27 0324) SpO2:  [95 %-100 %] 99 % (02/27 0324) Weight:  [74.8 kg-81.2 kg] 74.8 kg (02/27 0601) Last BM Date: 05/21/19  Weight change: Filed Weights   05/22/19 1106 05/22/19 1420 05/23/19 0601  Weight: 81.2 kg 79.1 kg 74.8 kg    Intake/Output:   Intake/Output Summary (Last 24 hours) at 05/23/2019 0820 Last data filed at 05/23/2019 0601 Gross per 24 hour  Intake 1124.26 ml  Output 3700 ml  Net -2575.74 ml      Physical Exam    General:  Well appearing. No resp difficulty HEENT: Normal Neck: Supple. JVP 12 cm. Carotids 2+ bilat; no bruits. No lymphadenopathy or thyromegaly appreciated. Cor: PMI nondisplaced. Regular rate & rhythm. No rubs, gallops or murmurs. Lungs: Clear Abdomen: Soft, nontender, nondistended. No hepatosplenomegaly. No bruits or masses. Good bowel sounds. Extremities: No cyanosis, clubbing, rash. Trace ankle edema.  Neuro: Alert & orientedx3, cranial nerves grossly intact. moves all 4 extremities w/o difficulty. Affect pleasant   Telemetry   NSR with frequent PVCs (personally reviewed)  Labs    CBC Recent Labs    05/22/19 1504 05/23/19 0257  WBC 7.6 6.4  NEUTROABS 6.2 5.0  HGB 11.5* 9.5*  HCT 37.3* 28.5*  MCV 94.2 85.6  PLT 189 [id number]   Basic Metabolic Panel Recent Labs    05/22/19 1504 05/23/19 0257  NA 137 135  K 4.3 3.6  CL 96*  94*  CO2 24 28  GLUCOSE 115* 157*  BUN 70* 71*  CREATININE 3.12* 3.17*  CALCIUM 9.5 8.9  MG 2.0  --    Liver Function Tests Recent Labs    05/20/19 1209 05/22/19 1504  AST 23 25  ALT 20 19  ALKPHOS 60 52  BILITOT 0.9 1.2  PROT 7.0 7.4  ALBUMIN 3.5 3.6   No results for input(s): LIPASE, AMYLASE in the last 72 hours. Cardiac Enzymes No results for input(s): CKTOTAL, CKMB, CKMBINDEX, TROPONINI in the last 72 hours.  BNP: BNP (last 3 results) Recent Labs    05/22/19 1504  BNP 2,794.1*    ProBNP (last 3 results) Recent Labs    10/21/18 1639 03/06/19 0943 03/12/19 1228  PROBNP 6,802* [id number]* 7,736*     D-Dimer No results for input(s): DDIMER in the last 72 hours. Hemoglobin A1C Recent Labs    05/23/19 0257  HGBA1C 7.6*   Fasting Lipid Panel No results for input(s): CHOL, HDL, LDLCALC, TRIG, CHOLHDL, LDLDIRECT in the last 72 hours. Thyroid Function Tests Recent Labs    05/22/19 1504  TSH 3.789    Other results:   Imaging    DG Chest 2 View  Result Date: 05/22/2019 CLINICAL DATA:  CHF. EXAM: CHEST - 2 VIEW COMPARISON:  04/28/2019.  PET-CT 12/18/2019. FINDINGS: Cardiac pacer stable position. Prior CABG. Stable cardiomegaly. No pulmonary venous congestion. Persistent right base  atelectasis/infiltrate and small right pleural effusion, no interim change. Linear densities in again noted over the right chest with lung markings distally. These linear densities are most likely related to skin folds. No pneumothorax visualized. IMPRESSION: 1. Persistent right base atelectasis/infiltrate and small right pleural effusion. No interim change. 2. Cardiac pacer stable position. Prior CABG. Stable cardiomegaly. Electronically Signed   By: Marcello Moores  Register   On: 05/22/2019 15:35   CARDIAC CATHETERIZATION  Result Date: 05/22/2019 1. 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 2. Low but not markedly low cardiac output. 3. Elevated right and left heart  filling pressures.      Medications:     Scheduled Medications: . apixaban  2.5 mg Oral BID  . insulin aspart  0-15 Units Subcutaneous TID WC  . insulin aspart  0-5 Units Subcutaneous QHS  . metoprolol succinate  25 mg Oral Daily  . pantoprazole  40 mg Oral Daily  . potassium chloride  40 mEq Oral BID  . simvastatin  20 mg Oral QHS  . sodium chloride flush  3 mL Intravenous Q12H     Infusions: . sodium chloride    . sodium chloride 10 mL/hr at 05/22/19 1125  . sodium chloride    . amiodarone    . furosemide (LASIX) infusion 12 mg/hr (05/22/19 2300)  . milrinone 0.25 mcg/kg/min (05/22/19 2300)     PRN Medications:  sodium chloride, sodium chloride, acetaminophen, gabapentin, ondansetron (ZOFRAN) IV, sodium chloride flush, sodium chloride flush   Assessment/Plan   1. Acute on chronic systolic CHF: Ischemic cardiomyopathy. Echo in 8/20 with EF 20-25%, moderately decreased RV systolic function. St Jude ICD. NYHA class III symptoms. He has had recalcitrant volume overload. This is complicated by CKD stage 3 with recent AKI, creatinine in 3's. I was concerned about possible low output HF with cardiorenal syndrome. Therefore, RHC was done 2/26.  This showed elevated right and left heart filling pressure with mixed pulmonary venous/pulmonary arterial hypertension.  Cardiac output was low, but not markedly low.  I admitted him for IV diuresis with use of milrinone. He did very well overnight with excellent UOP and weight trending down.  Volume status significantly improved, still appears to have some residual volume overload.  Creatinine stable at 3.17.  - Continue milrinone 0.25 mcg/kg/min today.   -Lasix gtt 12 mg/hr today, probably can stop Lasix gtt tomorrow morning.  Replace K and check Mg.  -He will stay off Entresto and spironolactone with AKI. - Continue Toprol XL 25 mg daily.  - Narrow QRS, not candidate for upgrade to CRT. - Low voltage ECG, will get PYP scan  while inpatient.  Check myeloma panel and urine immunofixation.  - Age and renal function will probably rule out LVAD placement.  2. CAD: S/p CABG 2007, patent grafts on LHC in 2016. No exertional chest pain.  - Given stable CAD and Eliquis use,he is not on ASA. - Continue statin.  3. Atrial fibrillation: Paroxysmal. He is now back in NSR post-DCCV. He feels much better in NSR.  -Will change to amiodarone gtt while on milrinone given frequent PVCs. Back to po when off milrinone.  4. Right chronic pleural effusion:Only small effusion on recent CXR. 5. CKD stage 3with recent [id number] today. Suspect cardiorenal syndrome.  Using milrinone to facilitate diuresis. Creatinine stable so far.    Length of Stay: 1  [name], MD  05/23/2019, 8:20 AM  Advanced Heart Failure Team Pager [phone] (M-F; 7a - 4p)  Please contact [facility] for night-coverage  after hours (4p -7a ) and weekends on [email or web address]

## 2019-05-23 NOTE — Progress Notes (Signed)
Notified MD about frequent AIVR, PVC,NSVT heart rate 52 BP 110/51. Patient on lasix and Milrinone gtt. Patient asymptomatic. No orders received at this time. I will continue to monitor.

## 2019-05-23 NOTE — Plan of Care (Signed)

## 2019-05-23 NOTE — Progress Notes (Signed)
Orthopedic Tech Progress Note Patient Details:  [name] 1935-04-16 [id number] Order was cancelled  Patient ID: [name] Ridges., male   DOB: 07-27-1935, 84 y.o.   MRN: [id number]   [name] 05/23/2019, 1:45 PM

## 2019-05-24 ENCOUNTER — Encounter (HOSPITAL_COMMUNITY): Payer: Self-pay | Admitting: Cardiology

## 2019-05-24 LAB — CBC WITH DIFFERENTIAL/PLATELET
Abs Immature Granulocytes: 0.04 10*3/uL (ref 0.00–0.07)
Basophils Absolute: 0 10*3/uL (ref 0.0–0.1)
Basophils Relative: 0 %
Eosinophils Absolute: 0.1 10*3/uL (ref 0.0–0.5)
Eosinophils Relative: 1 %
HCT: 32.5 % — ABNORMAL LOW (ref 39.0–52.0)
Hemoglobin: 10.8 g/dL — ABNORMAL LOW (ref 13.0–17.0)
Immature Granulocytes: 1 %
Lymphocytes Relative: 7 %
Lymphs Abs: 0.6 10*3/uL — ABNORMAL LOW (ref 0.7–4.0)
MCH: 28.4 pg (ref 26.0–34.0)
MCHC: 33.2 g/dL (ref 30.0–36.0)
MCV: 85.5 fL (ref 80.0–100.0)
Monocytes Absolute: 0.8 10*3/uL (ref 0.1–1.0)
Monocytes Relative: 9 %
Neutro Abs: 6.6 10*3/uL (ref 1.7–7.7)
Neutrophils Relative %: 82 %
Platelets: 200 10*3/uL (ref 150–400)
RBC: 3.8 MIL/uL — ABNORMAL LOW (ref 4.22–5.81)
RDW: 17 % — ABNORMAL HIGH (ref 11.5–15.5)
WBC: 8.2 10*3/uL (ref 4.0–10.5)
nRBC: 0 % (ref 0.0–0.2)

## 2019-05-24 LAB — BASIC METABOLIC PANEL
Anion gap: 14 (ref 5–15)
BUN: 66 mg/dL — ABNORMAL HIGH (ref 8–23)
CO2: 28 mmol/L (ref 22–32)
Calcium: 8.8 mg/dL — ABNORMAL LOW (ref 8.9–10.3)
Chloride: 94 mmol/L — ABNORMAL LOW (ref 98–111)
Creatinine, Ser: 2.89 mg/dL — ABNORMAL HIGH (ref 0.61–1.24)
GFR calc Af Amer: 23 mL/min — ABNORMAL LOW (ref >60)
GFR calc non Af Amer: 19 mL/min — ABNORMAL LOW (ref >60)
Glucose, Bld: 149 mg/dL — ABNORMAL HIGH (ref 70–99)
Potassium: 3.9 mmol/L (ref 3.5–5.1)
Sodium: 136 mmol/L (ref 135–145)

## 2019-05-24 LAB — GLUCOSE, CAPILLARY
Glucose-Capillary: 157 mg/dL — ABNORMAL HIGH (ref 70–99)
Glucose-Capillary: 305 mg/dL — ABNORMAL HIGH (ref 70–99)
Glucose-Capillary: 65 mg/dL — ABNORMAL LOW (ref 70–99)
Glucose-Capillary: 66 mg/dL — ABNORMAL LOW (ref 70–99)
Glucose-Capillary: 150 mg/dL — ABNORMAL HIGH (ref 70–99)
Glucose-Capillary: 251 mg/dL — ABNORMAL HIGH (ref 70–99)

## 2019-05-24 LAB — MAGNESIUM: Magnesium: 2.1 mg/dL (ref 1.7–2.4)

## 2019-05-24 MED ORDER — MILRINONE LACTATE IN DEXTROSE 20-5 MG/100ML-% IV SOLN
0.1250 ug/kg/min | INTRAVENOUS | Status: DC
  Administered 2019-05-24: 0.125 ug/kg/min via INTRAVENOUS
  Filled 2019-05-24: qty 100

## 2019-05-24 MED ORDER — INSULIN ASPART 100 UNIT/ML ~~LOC~~ SOLN: 0.0000 [IU] | Freq: Every day | SUBCUTANEOUS | Status: DC

## 2019-05-24 MED ORDER — INSULIN ASPART 100 UNIT/ML ~~LOC~~ SOLN
0.0000 [IU] | Freq: Three times a day (TID) | SUBCUTANEOUS | Status: DC
  Administered 2019-05-25: 2 [IU] via SUBCUTANEOUS

## 2019-05-24 MED ORDER — TORSEMIDE 20 MG PO TABS
40.0000 mg | ORAL_TABLET | Freq: Two times a day (BID) | ORAL | Status: DC
  Administered 2019-05-24 – 2019-05-25 (×2): 40 mg via ORAL
  Filled 2019-05-24 (×2): qty 2

## 2019-05-24 NOTE — Progress Notes (Signed)
Hypoglycemic Event  CBG: 65   Treatment: 4 oz juice/soda  Symptoms: None  Follow-up CBG: Time:5:45 CBG Result: 150   Possible Reasons for Event: medication   Comments/MD notified:Dr. Debbora Dus, Lisa Milian I

## 2019-05-24 NOTE — Progress Notes (Signed)
Latest telemetry rhythm looks like junctional rythm shown to card. for second opinion  who claimed that it is sinus with PAC's and PVC's . Continue to monitor.

## 2019-05-24 NOTE — Progress Notes (Signed)
Patient ID: [name] Ridges., male   DOB: 12/10/1935, 84 y.o.   MRN: [id number]     Advanced Heart Failure Rounding Note  PCP-Cardiologist: [name], MD   Subjective:    Good diuresis again yesterday, weight down 4 more lbs (not all I/Os recorded).  Creatinine lower at 2.89. Breathing is better, cough has resolved.  Remains in NSR on amiodarone gtt.  He is on milrinone 0.25 and Lasix 12 mg/hr.     Objective:   Weight Range: 72.9 kg Body mass index is 22.42 kg/m.   Vital Signs:   Temp:  [97.4 F (36.3 C)-98 F (36.7 C)] 97.4 F (36.3 C) (02/28 0300) Pulse Rate:  [54-70] 70 (02/28 0800) Resp:  [12-24] 14 (02/28 0734) BP: (105-140)/(51-87) 119/55 (02/28 0734) SpO2:  [95 %-99 %] 95 % (02/28 0800) Weight:  [72.9 kg] 72.9 kg (02/28 0300) Last BM Date: 05/21/19  Weight change: Filed Weights   05/22/19 1420 05/23/19 0601 05/24/19 0300  Weight: 79.1 kg 74.8 kg 72.9 kg    Intake/Output:   Intake/Output Summary (Last 24 hours) at 05/24/2019 0821 Last data filed at 05/23/2019 2354 Gross per 24 hour  Intake 1556.69 ml  Output 2150 ml  Net -593.31 ml      Physical Exam    General: NAD Neck: No JVD, no thyromegaly or thyroid nodule.  Lungs: Clear to auscultation bilaterally with normal respiratory effort. CV: Lateral PMI.  Heart regular S1/S2, no S3/S4, no murmur.  No peripheral edema.   Abdomen: Soft, nontender, no hepatosplenomegaly, no distention.  Skin: Intact without lesions or rashes.  Neurologic: Alert and oriented x 3.  Psych: Normal affect. Extremities: No clubbing or cyanosis.  HEENT: Normal.    Telemetry   NSR with occasional PVCs (personally reviewed)  Labs    CBC Recent Labs    05/23/19 0257 05/24/19 0234  WBC 6.4 8.2  NEUTROABS 5.0 6.6  HGB 9.5* 10.8*  HCT 28.5* 32.5*  MCV 85.6 85.5  PLT 191 [id number]   Basic Metabolic Panel Recent Labs    05/23/19 0257 05/24/19 0234  NA 135 136  K 3.6 3.9  CL 94* 94*  CO2 28 28  GLUCOSE 157*  149*  BUN 71* 66*  CREATININE 3.17* 2.89*  CALCIUM 8.9 8.8*  MG 1.8 2.1   Liver Function Tests Recent Labs    05/22/19 1504  AST 25  ALT 19  ALKPHOS 52  BILITOT 1.2  PROT 7.4  ALBUMIN 3.6   No results for input(s): LIPASE, AMYLASE in the last 72 hours. Cardiac Enzymes No results for input(s): CKTOTAL, CKMB, CKMBINDEX, TROPONINI in the last 72 hours.  BNP: BNP (last 3 results) Recent Labs    05/22/19 1504  BNP 2,794.1*    ProBNP (last 3 results) Recent Labs    10/21/18 1639 03/06/19 0943 03/12/19 1228  PROBNP 6,802* [id number]* 7,736*     D-Dimer No results for input(s): DDIMER in the last 72 hours. Hemoglobin A1C Recent Labs    05/23/19 0257  HGBA1C 7.6*   Fasting Lipid Panel No results for input(s): CHOL, HDL, LDLCALC, TRIG, CHOLHDL, LDLDIRECT in the last 72 hours. Thyroid Function Tests Recent Labs    05/22/19 1504  TSH 3.789    Other results:   Imaging    No results found.   Medications:     Scheduled Medications: . apixaban  2.5 mg Oral BID  . insulin aspart  0-15 Units Subcutaneous TID WC  . insulin aspart  0-5 Units Subcutaneous QHS  .  metoprolol succinate  25 mg Oral Daily  . pantoprazole  40 mg Oral Daily  . simvastatin  20 mg Oral QHS  . sodium chloride flush  3 mL Intravenous Q12H  . torsemide  40 mg Oral BID    Infusions: . sodium chloride    . sodium chloride 10 mL/hr at 05/22/19 1125  . sodium chloride    . amiodarone 30 mg/hr (05/24/19 0800)  . milrinone      PRN Medications: sodium chloride, sodium chloride, acetaminophen, gabapentin, ondansetron (ZOFRAN) IV, sodium chloride flush, sodium chloride flush   Assessment/Plan   1. Acute on chronic systolic CHF: Ischemic cardiomyopathy. Echo in 8/20 with EF 20-25%, moderately decreased RV systolic function. St Jude ICD. NYHA class III symptoms. He has had recalcitrant volume overload. This is complicated by CKD stage 3 with recent AKI, creatinine in 3's. I was  concerned about possible low output HF with cardiorenal syndrome. Therefore, RHC was done 2/26.  This showed elevated right and left heart filling pressure with mixed pulmonary venous/pulmonary arterial hypertension.  Cardiac output was low, but not markedly low.  I admitted him for IV diuresis with use of milrinone. He did very well again yesterday with excellent UOP and weight trending down.  He now looks near-euvolemic.  Creatinine down to 2.89.  - Decrease milrinone to 0.125 mcg/kg/min today.   - Stop Lasix gtt, will start torsemide 40 mg bid this evening.  -He will stay off Entresto and spironolactone with elevated creatinine. - Continue Toprol XL 25 mg daily.  - Narrow QRS, not candidate for upgrade to CRT. - Low voltage ECG, will get PYP scan while inpatient => pending read.  Check myeloma panel and urine immunofixation.  - Age and renal function will probably rule out LVAD placement.  2. CAD: S/p CABG 2007, patent grafts on LHC in 2016. No exertional chest pain.  - Given stable CAD and Eliquis use,he is not on ASA. - Continue statin.  3. Atrial fibrillation: Paroxysmal. He is now back in NSR post-DCCV. He feels much better in NSR.  -He is on amiodarone gtt while on milrinone given frequent PVCs. Back to po when off milrinone.  4. Right chronic pleural effusion:Only small effusion on recent CXR. 5. CKD stage 3with recent AKI: Suspect cardiorenal syndrome.  Using milrinone to facilitate diuresis. Creatinine lower today.   Length of Stay: 2  [name], MD  05/24/2019, 8:21 AM  Advanced Heart Failure Team Pager [phone] (M-F; 7a - 4p)  Please contact [facility] for night-coverage after hours (4p -7a ) and weekends on [email or web address]

## 2019-05-25 LAB — CBC WITH DIFFERENTIAL/PLATELET
Abs Immature Granulocytes: 0.02 10*3/uL (ref 0.00–0.07)
Basophils Absolute: 0.1 10*3/uL (ref 0.0–0.1)
Basophils Relative: 1 %
Eosinophils Absolute: 0.1 10*3/uL (ref 0.0–0.5)
Eosinophils Relative: 2 %
HCT: 33.5 % — ABNORMAL LOW (ref 39.0–52.0)
Hemoglobin: 11.1 g/dL — ABNORMAL LOW (ref 13.0–17.0)
Immature Granulocytes: 0 %
Lymphocytes Relative: 8 %
Lymphs Abs: 0.6 10*3/uL — ABNORMAL LOW (ref 0.7–4.0)
MCH: 28.5 pg (ref 26.0–34.0)
MCHC: 33.1 g/dL (ref 30.0–36.0)
MCV: 86.1 fL (ref 80.0–100.0)
Monocytes Absolute: 0.8 10*3/uL (ref 0.1–1.0)
Monocytes Relative: 10 %
Neutro Abs: 6 10*3/uL (ref 1.7–7.7)
Neutrophils Relative %: 79 %
Platelets: 208 10*3/uL (ref 150–400)
RBC: 3.89 MIL/uL — ABNORMAL LOW (ref 4.22–5.81)
RDW: 16.9 % — ABNORMAL HIGH (ref 11.5–15.5)
WBC: 7.6 10*3/uL (ref 4.0–10.5)
nRBC: 0 % (ref 0.0–0.2)

## 2019-05-25 LAB — BASIC METABOLIC PANEL
Anion gap: 13 (ref 5–15)
BUN: 60 mg/dL — ABNORMAL HIGH (ref 8–23)
CO2: 31 mmol/L (ref 22–32)
Calcium: 8.7 mg/dL — ABNORMAL LOW (ref 8.9–10.3)
Chloride: 92 mmol/L — ABNORMAL LOW (ref 98–111)
Creatinine, Ser: 3.04 mg/dL — ABNORMAL HIGH (ref 0.61–1.24)
GFR calc Af Amer: 21 mL/min — ABNORMAL LOW (ref >60)
GFR calc non Af Amer: 18 mL/min — ABNORMAL LOW (ref >60)
Glucose, Bld: 102 mg/dL — ABNORMAL HIGH (ref 70–99)
Potassium: 3.6 mmol/L (ref 3.5–5.1)
Sodium: 136 mmol/L (ref 135–145)

## 2019-05-25 LAB — MAGNESIUM: Magnesium: 2.1 mg/dL (ref 1.7–2.4)

## 2019-05-25 LAB — GLUCOSE, CAPILLARY
Glucose-Capillary: 165 mg/dL — ABNORMAL HIGH (ref 70–99)
Glucose-Capillary: 251 mg/dL — ABNORMAL HIGH (ref 70–99)

## 2019-05-25 LAB — IMMUNOFIXATION, URINE

## 2019-05-25 MED ORDER — AMIODARONE HCL 200 MG PO TABS: 200.0000 mg | ORAL_TABLET | Freq: Every day | ORAL | 4 refills | Status: DC

## 2019-05-25 MED ORDER — TORSEMIDE 20 MG PO TABS: 40.0000 mg | ORAL_TABLET | Freq: Two times a day (BID) | ORAL | 6 refills | Status: DC

## 2019-05-25 MED ORDER — AMIODARONE HCL 200 MG PO TABS
200.0000 mg | ORAL_TABLET | Freq: Two times a day (BID) | ORAL | Status: DC
  Administered 2019-05-25: 200 mg via ORAL
  Filled 2019-05-25: qty 1

## 2019-05-25 NOTE — Progress Notes (Signed)
Discharge instructions reviewed with patient and daughter, all questions answered and understanding was verbalized. IV removed and tele discontinued.

## 2019-05-25 NOTE — Discharge Summary (Signed)
Advanced Heart Failure Team  Discharge Summary   Patient ID: [name]. MRN: [id number], DOB/AGE: 82-10-1935 82 y.o. Admit date: 05/22/2019 D/C date:     05/25/2019   Primary Discharge Diagnoses:  1.Acute on chronic systolic CHF: Ischemic cardiomyopathy. 2. CAD: S/p CABG 2007, patent grafts on LHC in 2016. No exertional chest pain.  3. Atrial fibrillation: Paroxysmal. 4. Right chronic pleural effusion 5. CKD stage 3with recent AKI:  Hospital Course:  84 y.o.with h/o CAD s/p CABG, ischemic cardiomyopathy, and diabetes is admitted post-cath for diuresis in the setting of cardiorenal syndrome.   Patient had CABG in 2007. Last cath in 2016 showed all 4 grafts patent. Echo was 25-30% in 2016. Most recent echo in 8/20 showed EF 20-25% with moderate RV dysfunction. He has had a recurrent right pleural effusion with thoracenteses in 5/19 and 8/20. He thinks that he has been in atrial fibrillation persistently for about a year. He used to take amiodarone but stopped it because he "felt bad." However, he does not think that stopping amiodarone made him feel any better. Creatinine has been elevated, CKD stage 3.   Admitted form cath lab with elevated filling pressures. Placed on milrinone and diuresed with IV lasix. Milrinone was later weaned off. IV lasix was transitioned to torsemide 40 mg twice a day. Placed IV amiodarone while on milrinone. This was later switched to amio 200 mg daily discharge.  He will continue to be followed closely in the HF clinic. See below for detailed problem list.   1.Acute on chronic systolic CHF: Ischemic cardiomyopathy. Echo in 8/20 with EF 20-25%, moderately decreased RV systolic function. St Jude ICD. NYHA class III symptoms. He has had recalcitrant volume overload. This is complicated by CKD stage 3 with recent AKI, creatinine in 3's. Iwasconcerned about possible low output HF with cardiorenal syndrome. Therefore, RHC was done 2/26. This  showed elevated right and left heart filling pressure with mixed pulmonary venous/pulmonary arterial hypertension. Cardiac output was low, but not markedly low. Admitted him for IV diuresis with use of milrinone. He did very well .  Renal function was followed closely and remained stable at 3.04.  - Transitioned from IV lasix to torsemide 40 mg bid.  -He will stay off Entresto and spironolactone with elevated creatinine. - Continue Toprol XL 25 mg daily.  - Narrow QRS, not candidate for upgrade to CRT. - Low voltage ECG,will get PYP scan while inpatient => pending read. Check myeloma panel and urine immunofixation.  - Age and renal function will probably rule out LVAD placement. 2. CAD: S/p CABG 2007, patent grafts on LHC in 2016. No exertional chest pain.  - Given stable CAD and Eliquis use,he is not on ASA. - Continue statin.  3. Atrial fibrillation: Paroxysmal. He is now back in NSR.  Initially on amio drip but po resumed at discharged.     4. Right chronic pleural effusion:Only small effusion on recent CXR. 5. CKD stage 3with recent AKI: Suspect cardiorenal syndrome.  Creatinine stable 3.04    Discharge Vitals: Blood pressure 130/63, pulse 72, temperature (!) 97.4 F (36.3 C), temperature source Oral, resp. rate 19, height 5\' 11"  (1.803 m), weight 72.8 kg, SpO2 100 %.  Labs: Lab Results  Component Value Date   WBC 7.6 05/25/2019   HGB 11.1 (L) 05/25/2019   HCT 33.5 (L) 05/25/2019   MCV 86.1 05/25/2019   PLT 208 05/25/2019    Recent Labs  Lab 05/22/19 1504 05/23/19 0257 05/25/19 0141  NA  137   < > 136  K 4.3   < > 3.6  CL 96*   < > 92*  CO2 24   < > 31  BUN 70*   < > 60*  CREATININE 3.12*   < > 3.04*  CALCIUM 9.5   < > 8.7*  PROT 7.4  --   --   BILITOT 1.2  --   --   ALKPHOS 52  --   --   ALT 19  --   --   AST 25  --   --   GLUCOSE 115*   < > 102*   < > = values in this interval not displayed.   No results found for: CHOL, HDL, LDLCALC,  TRIG BNP (last 3 results) Recent Labs    05/22/19 1504  BNP 2,794.1*    ProBNP (last 3 results) Recent Labs    10/21/18 1639 03/06/19 0943 03/12/19 1228  PROBNP 6,802* 12,634* 7,736*     Diagnostic Studies/Procedures   NM CARDIAC AMYLOID TUMOR LOC INFLAM SPECT 1 DAY  Result Date: 05/25/2019 CLINICAL DATA:  HEART FAILURE. CONCERN FOR CARDIAC AMYLOIDOSIS. EXAM: NUCLEAR MEDICINE TUMOR LOCALIZATION. PYP CARDIAC AMYLOIDOSIS SCAN WITH SPECT TECHNIQUE: Following intravenous administration of radiopharmaceutical, anterior planar images of the chest were obtained. Regions of interest were placed on the heart and contralateral chest wall for quantitative assessment. Additional SPECT imaging of the chest was obtained. RADIOPHARMACEUTICALS:  21.5 mCi TECHNETIUM 99 PYROPHOSPHATE FINDINGS: Planar Visual assessment: Anterior planar imaging demonstrates radiotracer uptake within the heart less than uptake within the adjacent ribs (Grade 1). Quantitative assessment : Quantitative assessment of the cardiac uptake compared to the contralateral chest wall is equal to 1.6 (H/CL = 1.6). SPECT assessment: SPECT imaging of the chest demonstrates mild radiotracer accumulation within the LEFT ventricle. IMPRESSION: Visual and quantitative assessment (grade 1, H/CLL equal 1.6) is equivocal for transthyretin amyloidosis. Consider repeat exam in the future. Electronically Signed   By: Suzy Bouchard M.D.   On: 05/25/2019 08:40    Discharge Medications   Allergies as of 05/25/2019   No Known Allergies     Medication List    STOP taking these medications   furosemide 20 MG tablet Commonly known as: LASIX     TAKE these medications   amiodarone 200 MG tablet Commonly known as: PACERONE Take 1 tablet (200 mg total) by mouth daily. What changed:   how much to take  how to take this  when to take this  additional instructions   apixaban 2.5 MG Tabs tablet Commonly known as: Eliquis Take 1 tablet  (2.5 mg total) by mouth 2 (two) times daily.   calcium-vitamin D 500-200 MG-UNIT tablet Commonly known as: OSCAL WITH D Take 1 tablet by mouth daily.   Co Q 10 100 MG Caps Take 300 mg by mouth daily.   fish oil-omega-3 fatty acids 1000 MG capsule Take 1 g by mouth daily.   gabapentin 300 MG capsule Commonly known as: NEURONTIN Take 300 mg by mouth 2 (two) times daily as needed (Nerve pain).   glipiZIDE 10 MG tablet Commonly known as: GLUCOTROL Take 10 mg by mouth 2 (two) times daily before a meal.   metFORMIN 500 MG tablet Commonly known as: GLUCOPHAGE Take 2 tablets (1,000 mg total) by mouth 2 (two) times daily with a meal.   metoprolol succinate 25 MG 24 hr tablet Commonly known as: TOPROL-XL Take 1 tablet (25 mg total) by mouth daily.   MULTI-DAY VITAMINS PO  Take 1 tablet by mouth daily.   omeprazole 20 MG capsule Commonly known as: PRILOSEC Take 20 mg by mouth 2 (two) times daily before a meal.   simvastatin 20 MG tablet Commonly known as: ZOCOR Take 20 mg by mouth at bedtime.   torsemide 20 MG tablet Commonly known as: DEMADEX Take 2 tablets (40 mg total) by mouth 2 (two) times daily.   Turmeric 450 MG Caps Take 450 mg by mouth daily.   vitamin C 250 MG tablet Commonly known as: ASCORBIC ACID Take 250 mg by mouth 2 (two) times daily.   Vitamin D3 50 MCG (2000 UT) Tabs Take 2,000 Units by mouth daily. 50 mcg   vitamin E 1000 UNIT capsule Take 1,000 Units by mouth every other day.       Disposition   The patient will be discharged in stable condition to home. Discharge Instructions    (HEART FAILURE PATIENTS) Call MD:  Anytime you have any of the following symptoms: 1) 3 pound weight gain in 24 hours or 5 pounds in 1 week 2) shortness of breath, with or without a dry hacking cough 3) swelling in the hands, feet or stomach 4) if you have to sleep on extra pillows at night in order to breathe.   Complete by: As directed    Diet - low sodium heart  healthy   Complete by: As directed    Heart Failure patients record your daily weight using the same scale at the same time of day   Complete by: As directed    Increase activity slowly   Complete by: As directed      Follow-up Information    [facility] Follow up on 06/03/2019.   Specialty: Cardiology Why: at 1000. Garage Code [id number] Contact information: [address] [id number] [place] [phone]            Duration of Discharge Encounter: Greater than 35 minutes   Signed, Darrick Grinder NP-C  05/25/2019, 10:44 AM

## 2019-05-25 NOTE — TOC Progression Note (Signed)
Transition of Care (TOC) - Progression Note    Patient Details  Name: [name]. MRN: [id number] Date of Birth: 1935/07/16  Transition of Care [facility] CM/SW Contact  Zenon Mayo, RN Phone Number: 05/25/2019, 11:11 AM  Clinical Narrative:    Patient is for dc today, he goes to the HF clinic. Has follow up apt at HF clinic.       Expected Discharge Plan and Services           Expected Discharge Date: 05/25/19                                     Social Determinants of Health (SDOH) Interventions    Readmission Risk Interventions No flowsheet data found.

## 2019-05-25 NOTE — Progress Notes (Addendum)
Patient ID: [name]., male   DOB: 1935/06/06, 84 y.o.   MRN: [id number]     Advanced Heart Failure Rounding Note  PCP-Cardiologist: [name], MD   Subjective:   Yesterday milrinone was cut back to 0.125 mcg.  Remains on amiodarone 30 mg per hour.   Denies SOB.    Objective:   Weight Range: 72.8 kg Body mass index is 22.38 kg/m.   Vital Signs:   Temp:  [97.4 F (36.3 C)-97.9 F (36.6 C)] 97.4 F (36.3 C) (03/01 0700) Pulse Rate:  [52-72] 72 (03/01 0700) Resp:  [9-19] 19 (03/01 0700) BP: (114-138)/(52-75) 130/63 (03/01 0700) SpO2:  [92 %-100 %] 100 % (03/01 0700) Weight:  [72.8 kg] 72.8 kg (03/01 0300) Last BM Date: 05/21/19  Weight change: Filed Weights   05/23/19 0601 05/24/19 0300 05/25/19 0300  Weight: 74.8 kg 72.9 kg 72.8 kg    Intake/Output:   Intake/Output Summary (Last 24 hours) at 05/25/2019 0752 Last data filed at 05/25/2019 0300 Gross per 24 hour  Intake 648.93 ml  Output 1500 ml  Net -851.07 ml      Physical Exam    .General:  Standing n the room. Well appearing. No resp difficulty HEENT: normal Neck: supple. no JVD. Carotids 2+ bilat; no bruits. No lymphadenopathy or thryomegaly appreciated. Cor: PMI nondisplaced. Regular rate & rhythm. No rubs, gallops or murmurs. Lungs: clear Abdomen: soft, nontender, nondistended. No hepatosplenomegaly. No bruits or masses. Good bowel sounds. Extremities: no cyanosis, clubbing, rash, R and LLE trace edema Neuro: alert & orientedx3, cranial nerves grossly intact. moves all 4 extremities w/o difficulty. Affect pleasant   Telemetry   NSR 70s with PVCs.   Labs    CBC Recent Labs    05/24/19 0234 05/25/19 0141  WBC 8.2 7.6  NEUTROABS 6.6 6.0  HGB 10.8* 11.1*  HCT 32.5* 33.5*  MCV 85.5 86.1  PLT 200 [id number]   Basic Metabolic Panel Recent Labs    05/24/19 0234 05/25/19 0141  NA 136 136  K 3.9 3.6  CL 94* 92*  CO2 28 31  GLUCOSE 149* 102*  BUN 66* 60*  CREATININE 2.89* 3.04*   CALCIUM 8.8* 8.7*  MG 2.1 2.1   Liver Function Tests Recent Labs    05/22/19 1504  AST 25  ALT 19  ALKPHOS 52  BILITOT 1.2  PROT 7.4  ALBUMIN 3.6   No results for input(s): LIPASE, AMYLASE in the last 72 hours. Cardiac Enzymes No results for input(s): CKTOTAL, CKMB, CKMBINDEX, TROPONINI in the last 72 hours.  BNP: BNP (last 3 results) Recent Labs    05/22/19 1504  BNP 2,794.1*    ProBNP (last 3 results) Recent Labs    10/21/18 1639 03/06/19 0943 03/12/19 1228  PROBNP 6,802* [id number]* 7,736*     D-Dimer No results for input(s): DDIMER in the last 72 hours. Hemoglobin A1C Recent Labs    05/23/19 0257  HGBA1C 7.6*   Fasting Lipid Panel No results for input(s): CHOL, HDL, LDLCALC, TRIG, CHOLHDL, LDLDIRECT in the last 72 hours. Thyroid Function Tests Recent Labs    05/22/19 1504  TSH 3.789    Other results:   Imaging    No results found.   Medications:     Scheduled Medications: . apixaban  2.5 mg Oral BID  . insulin aspart  0-5 Units Subcutaneous QHS  . insulin aspart  0-9 Units Subcutaneous TID WC  . metoprolol succinate  25 mg Oral Daily  . pantoprazole  40 mg  Oral Daily  . simvastatin  20 mg Oral QHS  . sodium chloride flush  3 mL Intravenous Q12H  . torsemide  40 mg Oral BID    Infusions: . sodium chloride    . sodium chloride 10 mL/hr at 05/22/19 1125  . sodium chloride    . amiodarone 30 mg/hr (05/24/19 1900)  . milrinone 0.125 mcg/kg/min (05/24/19 1900)    PRN Medications: sodium chloride, sodium chloride, acetaminophen, gabapentin, ondansetron (ZOFRAN) IV, sodium chloride flush, sodium chloride flush   Assessment/Plan   1. Acute on chronic systolic CHF: Ischemic cardiomyopathy. Echo in 8/20 with EF 20-25%, moderately decreased RV systolic function. St Jude ICD. NYHA class III symptoms. He has had recalcitrant volume overload. This is complicated by CKD stage 3 with recent AKI, creatinine in 3's. I was concerned about  possible low output HF with cardiorenal syndrome. Therefore, RHC was done 2/26.  This showed elevated right and left heart filling pressure with mixed pulmonary venous/pulmonary arterial hypertension.  Cardiac output was low, but not markedly low.  I admitted him for IV diuresis with use of milrinone. He did very well again yesterday with excellent UOP and weight trending down.  He now looks near-euvolemic.  Creatinine 3.04.  Volume status stable. Continue torsemide 40 mg bid.  -He will stay off Entresto and spironolactone with elevated creatinine. - Continue Toprol XL 25 mg daily.  - Narrow QRS, not candidate for upgrade to CRT. - Low voltage ECG, will get PYP scan while inpatient => pending read.  Check myeloma panel and urine immunofixation.  - Age and renal function will probably rule out LVAD placement.  2. CAD: S/p CABG 2007, patent grafts on LHC in 2016. No exertional chest pain.  - Given stable CAD and Eliquis use,he is not on ASA. - Continue statin.  3. Atrial fibrillation: Paroxysmal. He is now back in NSR.  Stop amio drip. Resume amiodarone 200 mg daily.    4. Right chronic pleural effusion:Only small effusion on recent CXR. 5. CKD stage 3with recent AKI: Suspect cardiorenal syndrome.   Creatinine stable 3.04    Home later today. Discussed med changes.  Length of Stay: 3  Amy Clegg, NP  05/25/2019, 7:52 AM  Advanced Heart Failure Team Pager [phone] (M-F; 7a - 4p)  Please contact [facility] for night-coverage after hours (4p -7a ) and weekends on [email or web address]  Patient seen with NP, agree with the above note.  He is stable this morning, creatinine remains around 3.  Volume status much improved, no JVD or edema.  Stop milrinone today.  Stop IV amiodarone, restart amiodarone 200 mg daily.  Will send him home on torsemide 40 mg bid for now.  Will need close followup in CHF clinic.   [name] 05/25/2019 8:41 AM

## 2019-05-25 NOTE — Plan of Care (Signed)

## 2019-05-26 LAB — MULTIPLE MYELOMA PANEL, SERUM
Albumin SerPl Elph-Mcnc: 3.3 g/dL (ref 2.9–4.4)
Albumin/Glob SerPl: 1.2 (ref 0.7–1.7)
Alpha 1: 0.2 g/dL (ref 0.0–0.4)
Alpha2 Glob SerPl Elph-Mcnc: 0.7 g/dL (ref 0.4–1.0)
B-Globulin SerPl Elph-Mcnc: 0.8 g/dL (ref 0.7–1.3)
Gamma Glob SerPl Elph-Mcnc: 1 g/dL (ref 0.4–1.8)
Globulin, Total: 2.8 g/dL (ref 2.2–3.9)
IgA: 176 mg/dL (ref 61–437)
IgG (Immunoglobin G), Serum: 1020 mg/dL (ref 603–1613)
IgM (Immunoglobulin M), Srm: 76 mg/dL (ref 15–143)
Total Protein ELP: 6.1 g/dL (ref 6.0–8.5)

## 2019-05-27 ENCOUNTER — Ambulatory Visit (INDEPENDENT_AMBULATORY_CARE_PROVIDER_SITE_OTHER): Payer: PPO | Admitting: Physician Assistant

## 2019-05-27 ENCOUNTER — Other Ambulatory Visit: Payer: Self-pay

## 2019-05-27 VITALS — BP 120/75 | Ht 71.0 in | Wt 158.5 lb

## 2019-05-27 DIAGNOSIS — I6529 Occlusion and stenosis of unspecified carotid artery: Secondary | ICD-10-CM | POA: Diagnosis not present

## 2019-05-27 NOTE — Progress Notes (Signed)
Virtual Visit via Telephone Note    I connected with [name]. on 05/27/2019 using the [email or web address] by telephone and verified that I was speaking with the correct person using two identifiers. Patient was located at home . I am located at [facility] office.   The limitations of evaluation and management by telemedicine and the availability of in person appointments have been previously discussed with the patient and are documented in the patients chart. The patient expressed understanding and consented to proceed.  PCP: Shirline Frees, MD   Chief Complaint: Carotid stenosis   History of Present Illness: [name]. is a 84 y.o. male with asymptomatic carotid stenosis here for follow up carotid duplex and surveillance.  He denise weakness, amaurosis or TIA.    He has hx of CABG in 2007.  He has a hx of afib and on AC but converted to NSR.   He has hx of ICD/PPM in March 2019.   He was recently in the hospital secondary to CHF and volume overload.  He was discharged home in stable condition on 05/25/19 and states he feels much better than at his admission.    The pt is on a statin for cholesterol management.  The pt is diabetic.   The pt is on BB for hypertension.   Tobacco hx:  remote The pt is on a daily aspirin. Other AC:  Eliquis  Past Medical History:  Diagnosis Date  . ANEMIA, HX OF   . Bronchial pneumonia Jan. 2016  . CAD, ARTERY BYPASS GRAFT   . CAROTID ARTERY STENOSIS   . Cataract   . DIABETES MELLITUS, TYPE II   . Diabetic retinal damage of right eye ([place]) 2015  . Diverticulosis   . HYPERCHOLESTEROLEMIA   . HYPERTENSION   . Myocardial infarction [facility]) June 2007  . New onset atrial fibrillation ([place]) 06/24/2014  . PULMONARY HYPERTENSION   . RENAL INSUFFICIENCY, CHRONIC   . UNSPECIFIED THROMBOCYTOPENIA     Past Surgical History:  Procedure Laterality Date  . CARDIAC CATHETERIZATION  06/24/2014  . CARDIOVERSION [name] 05/11/2019   Procedure: CARDIOVERSION;   Surgeon: Larey Dresser, MD;  Location: [facility] ENDOSCOPY;  Service: Cardiovascular;  Laterality: [name];  . CATARACT EXTRACTION  2012   right eye  . COLONOSCOPY    . CORONARY ARTERY BYPASS GRAFT      quad bypass/2007  . EYE SURGERY    . ICD IMPLANT [name] 06/11/2017   Procedure: ICD IMPLANT;  Surgeon: Constance Haw, MD;  Location: [facility] CV LAB;  Service: Cardiovascular;  Laterality: [name];  . IR THORACENTESIS ASP PLEURAL SPACE W/IMG GUIDE  10/28/2018  . LEFT HEART CATHETERIZATION WITH CORONARY/GRAFT ANGIOGRAM [name] 06/24/2014   Procedure: LEFT HEART CATHETERIZATION WITH Beatrix Fetters;  Surgeon: Burnell Blanks, MD;  Location: [facility] CATH LAB;  Service: Cardiovascular;  Laterality: [name];  . POLYPECTOMY    . RIGHT HEART CATH [name] 05/22/2019   Procedure: RIGHT HEART CATH;  Surgeon: Larey Dresser, MD;  Location: [facility] CV LAB;  Service: Cardiovascular;  Laterality: [name];  . TONSILLECTOMY      Current Meds  Medication Sig  . amiodarone (PACERONE) 200 MG tablet Take 1 tablet (200 mg total) by mouth daily.  [name] apixaban (ELIQUIS) 2.5 MG TABS tablet Take 1 tablet (2.5 mg total) by mouth 2 (two) times daily.  . calcium-vitamin D (OSCAL WITH D) 500-200 MG-UNIT per tablet Take 1 tablet by mouth daily.   . Cholecalciferol (VITAMIN D3)  2000 UNITS TABS Take 2,000 Units by mouth daily. 50 mcg  . Coenzyme Q10 (CO Q 10) 100 MG CAPS Take 300 mg by mouth daily.   . fish oil-omega-3 fatty acids 1000 MG capsule Take 1 g by mouth daily.    [name] gabapentin (NEURONTIN) 300 MG capsule Take 300 mg by mouth 2 (two) times daily as needed (Nerve pain).   [name] glipiZIDE (GLUCOTROL) 10 MG tablet Take 10 mg by mouth 2 (two) times daily before a meal.  . metFORMIN (GLUCOPHAGE) 500 MG tablet Take 2 tablets (1,000 mg total) by mouth 2 (two) times daily with a meal.  . metoprolol succinate (TOPROL-XL) 25 MG 24 hr tablet Take 1 tablet (25 mg total) by mouth daily.  . Multiple Vitamin (MULTI-DAY VITAMINS PO) Take 1  tablet by mouth daily.   [name] omeprazole (PRILOSEC) 20 MG capsule Take 20 mg by mouth 2 (two) times daily before a meal.   . simvastatin (ZOCOR) 20 MG tablet Take 20 mg by mouth at bedtime.   . torsemide (DEMADEX) 20 MG tablet Take 2 tablets (40 mg total) by mouth 2 (two) times daily.  . Turmeric 450 MG CAPS Take 450 mg by mouth daily.   . vitamin C (ASCORBIC ACID) 250 MG tablet Take 250 mg by mouth 2 (two) times daily.   . vitamin E 1000 UNIT capsule Take 1,000 Units by mouth every other day.    12 system ROS was negative unless otherwise noted in HPI   Observations/Objective: Right carotid 235 PSV 50-79% stenosis, left < 39%   Assessment and Plan: Asymptomatic Carotid stenosis  His duplex is unchanged from previous studies.  Follow Up Instructions:   Follow up 6 months for repeat carotid duplex.   I discussed the assessment and treatment plan with the patient. The patient was provided an opportunity to ask questions and all were answered. The patient agreed with the plan and demonstrated an understanding of the instructions.   The patient was advised to call back or seek an in-person evaluation if the symptoms worsen or if the condition fails to improve as anticipated.  I spent 15 minutes with the patient via telephone encounter.   Signed, Roxy Horseman [facility] Office: [phone]  05/27/2019, 1:37 PM

## 2019-05-28 ENCOUNTER — Other Ambulatory Visit: Payer: Self-pay | Admitting: *Deleted

## 2019-05-28 DIAGNOSIS — I480 Paroxysmal atrial fibrillation: Secondary | ICD-10-CM | POA: Diagnosis not present

## 2019-05-28 DIAGNOSIS — I2510 Atherosclerotic heart disease of native coronary artery without angina pectoris: Secondary | ICD-10-CM | POA: Diagnosis not present

## 2019-05-28 DIAGNOSIS — I129 Hypertensive chronic kidney disease with stage 1 through stage 4 chronic kidney disease, or unspecified chronic kidney disease: Secondary | ICD-10-CM | POA: Diagnosis not present

## 2019-05-28 DIAGNOSIS — I429 Cardiomyopathy, unspecified: Secondary | ICD-10-CM | POA: Diagnosis not present

## 2019-05-28 DIAGNOSIS — I6523 Occlusion and stenosis of bilateral carotid arteries: Secondary | ICD-10-CM

## 2019-05-28 DIAGNOSIS — E1122 Type 2 diabetes mellitus with diabetic chronic kidney disease: Secondary | ICD-10-CM | POA: Diagnosis not present

## 2019-05-28 DIAGNOSIS — E782 Mixed hyperlipidemia: Secondary | ICD-10-CM | POA: Diagnosis not present

## 2019-05-28 DIAGNOSIS — I10 Essential (primary) hypertension: Secondary | ICD-10-CM | POA: Diagnosis not present

## 2019-06-03 ENCOUNTER — Ambulatory Visit (HOSPITAL_COMMUNITY)
Admit: 2019-06-03 | Discharge: 2019-06-03 | Disposition: A | Discharge status: Home / Self Care | Payer: PPO | Attending: Cardiology | Admitting: Cardiology

## 2019-06-03 ENCOUNTER — Other Ambulatory Visit: Payer: Self-pay

## 2019-06-03 ENCOUNTER — Encounter (HOSPITAL_COMMUNITY): Payer: Self-pay | Admitting: Cardiology

## 2019-06-03 VITALS — BP 118/64 | HR 63 | Wt 153.5 lb

## 2019-06-03 DIAGNOSIS — Z7901 Long term (current) use of anticoagulants: Secondary | ICD-10-CM | POA: Diagnosis not present

## 2019-06-03 DIAGNOSIS — I509 Heart failure, unspecified: Secondary | ICD-10-CM | POA: Diagnosis not present

## 2019-06-03 DIAGNOSIS — E782 Mixed hyperlipidemia: Secondary | ICD-10-CM | POA: Diagnosis not present

## 2019-06-03 DIAGNOSIS — I480 Paroxysmal atrial fibrillation: Secondary | ICD-10-CM | POA: Insufficient documentation

## 2019-06-03 DIAGNOSIS — I130 Hypertensive heart and chronic kidney disease with heart failure and stage 1 through stage 4 chronic kidney disease, or unspecified chronic kidney disease: Secondary | ICD-10-CM | POA: Diagnosis not present

## 2019-06-03 DIAGNOSIS — Z951 Presence of aortocoronary bypass graft: Secondary | ICD-10-CM | POA: Diagnosis not present

## 2019-06-03 DIAGNOSIS — E1122 Type 2 diabetes mellitus with diabetic chronic kidney disease: Secondary | ICD-10-CM | POA: Diagnosis not present

## 2019-06-03 DIAGNOSIS — I5022 Chronic systolic (congestive) heart failure: Secondary | ICD-10-CM

## 2019-06-03 DIAGNOSIS — I255 Ischemic cardiomyopathy: Secondary | ICD-10-CM | POA: Insufficient documentation

## 2019-06-03 DIAGNOSIS — Z87891 Personal history of nicotine dependence: Secondary | ICD-10-CM | POA: Diagnosis not present

## 2019-06-03 DIAGNOSIS — E785 Hyperlipidemia, unspecified: Secondary | ICD-10-CM | POA: Diagnosis not present

## 2019-06-03 DIAGNOSIS — N183 Chronic kidney disease, stage 3 unspecified: Secondary | ICD-10-CM | POA: Diagnosis not present

## 2019-06-03 DIAGNOSIS — J90 Pleural effusion, not elsewhere classified: Secondary | ICD-10-CM | POA: Insufficient documentation

## 2019-06-03 DIAGNOSIS — Z79899 Other long term (current) drug therapy: Secondary | ICD-10-CM | POA: Diagnosis not present

## 2019-06-03 DIAGNOSIS — I129 Hypertensive chronic kidney disease with stage 1 through stage 4 chronic kidney disease, or unspecified chronic kidney disease: Secondary | ICD-10-CM | POA: Diagnosis not present

## 2019-06-03 DIAGNOSIS — Z7984 Long term (current) use of oral hypoglycemic drugs: Secondary | ICD-10-CM | POA: Insufficient documentation

## 2019-06-03 DIAGNOSIS — D649 Anemia, unspecified: Secondary | ICD-10-CM | POA: Diagnosis not present

## 2019-06-03 DIAGNOSIS — I2510 Atherosclerotic heart disease of native coronary artery without angina pectoris: Secondary | ICD-10-CM | POA: Diagnosis not present

## 2019-06-03 DIAGNOSIS — I10 Essential (primary) hypertension: Secondary | ICD-10-CM | POA: Diagnosis not present

## 2019-06-03 LAB — COMPREHENSIVE METABOLIC PANEL
ALT: 30 U/L (ref 0–44)
AST: 35 U/L (ref 15–41)
Albumin: 3.4 g/dL — ABNORMAL LOW (ref 3.5–5.0)
Alkaline Phosphatase: 47 U/L (ref 38–126)
Anion gap: 15 (ref 5–15)
BUN: 105 mg/dL — ABNORMAL HIGH (ref 8–23)
CO2: 31 mmol/L (ref 22–32)
Calcium: 9.5 mg/dL (ref 8.9–10.3)
Chloride: 91 mmol/L — ABNORMAL LOW (ref 98–111)
Creatinine, Ser: 3.83 mg/dL — ABNORMAL HIGH (ref 0.61–1.24)
GFR calc Af Amer: 16 mL/min — ABNORMAL LOW (ref >60)
GFR calc non Af Amer: 14 mL/min — ABNORMAL LOW (ref >60)
Glucose, Bld: 201 mg/dL — ABNORMAL HIGH (ref 70–99)
Potassium: 3.4 mmol/L — ABNORMAL LOW (ref 3.5–5.1)
Sodium: 137 mmol/L (ref 135–145)
Total Bilirubin: 0.7 mg/dL (ref 0.3–1.2)
Total Protein: 6.9 g/dL (ref 6.5–8.1)

## 2019-06-03 LAB — TSH: TSH: 3.453 u[IU]/mL (ref 0.350–4.500)

## 2019-06-03 MED ORDER — TORSEMIDE 20 MG PO TABS: ORAL_TABLET | ORAL | 6 refills | Status: DC

## 2019-06-03 NOTE — Patient Instructions (Signed)
DECREASE Torsemide to 40mg  in the AM and 20mg  in the PM  Routine lab work today. Will notify you of abnormal results  You have been referred to cardiac rehab (they will call you to schedule appointment)

## 2019-06-03 NOTE — Progress Notes (Signed)
PCP: Shirline Frees, MD Cardiology: Dr. Angelena Form  84 y.o. with h/o CAD s/p CABG, ischemic cardiomyopathy, and diabetes was referred by Dr. Angelena Form for evaluation of CHF.  Patient had CABG in 2007.  Last cath in 2016 showed all 4 grafts patent.  Echo was 25-30% in 2016.  Most recent echo in 8/20 showed EF 20-25% with moderate RV dysfunction.  He has had a recurrent right pleural effusion with thoracenteses in 5/19 and 8/20.  He thinks that he has been in atrial fibrillation persistently for about a year.  He used to take amiodarone but stopped it because he "felt bad."  However, he does not think that stopping amiodarone made him feel any better.  Creatinine has been elevated, CKD stage 3.   At last appointment, I thought he was volume overloaded and increased Lasix, but his creatinine steadily rose (2.55 => 3.13 => 3.72). I stopped Entresto and he never started spironolactone.  Lasix was cut back to 60 mg daily. He says that during this time, he got his COVID vaccination and after that, he became severely nauseated for a number of days and ate/drank very little.  This eventually resolved.   On 05/11/19, I cardioverted him back to NSR.    Later in 2/21, he had RHC.  This showed elevated right and left heart filling pressures with low but not markedly low cardiac output.  He was admitted for IV diuresis.  While diuresing, he was maintained on milrinone.  This was weaned prior to discharge. PYP scan done in the hospital was equivocal for ATTR amyloidosis.   He returns for followup of CHF.  Weight is down 26 lbs since last appointment.  His breathing is much better.  No dyspnea walking up 1 flight of stairs or walking on flat ground.  No orthopnea/PND.  No lightheadedness.   Labs (12/20): K 4.5, creatinine 2.01 => 1.84  Labs (2/21): K 3.9, creatinine 2.55 => 3.13 => 3.72 Labs (3/21): hgb 11.1, K 3.6, creatinine 3.04, myeloma panel negative, urine immunofixation negative.   ECG (personally reviewed):  NSR, 1st degree AVB, old anterolateral MI.  PMH: 1. Type 2 diabetes  2. HTN 3. Hyperlipidemia 4. CAD: CABG x 4 in 2007 with LIMA-LAD, SVG-OM, sequential SVG-PLV/PDA.   - LHC (2016): Patent grafts.  5. Atrial fibrillation: Persistent.  - DCCV to NSR 2/21.  6. CKD stage 3.  7. Carotid stenosis:  - Carotid dopplers (8/20): 60-79% RICA stenosis.  8. Chronic systolic CHF: Ischemic cardiomyopathy.    - Echo (2016): EF 25-30% - Echo (2018): EF 25% - Echo (8/20): EF 20-25%, moderately decreased RV systolic function, severe LAE.  - St Jude ICD.   - RHC (3/21): mean RA 18, PA 71/27, mean PCWP 24, CI 2.16 (thermo), CI 2.2 (Fick), PVR 4.6 WU, PAPi 2.4.  - PYP scan (3/21): grade 1 but H/CL ratio 1.6.  9. Pleural effusion on right: s/p thoracentesis in 5/19 and 8/20.   Social History   Socioeconomic History  . Marital status: Married    Spouse name: Not on file  . Number of children: 2  . Years of education: Not on file  . Highest education level: Not on file  Occupational History  . Occupation: Horticulturist, commercial: RETIRED  Tobacco Use  . Smoking status: Former Smoker    Types: Cigarettes    Quit date: 03/26/1970    Years since quitting: 49.2  . Smokeless tobacco: Never Used  Substance and Sexual Activity  . Alcohol use:  Yes    Alcohol/week: 1.0 standard drinks    Types: 1 Glasses of wine per week  . Drug use: No  . Sexual activity: Not on file  Other Topics Concern  . Not on file  Social History Narrative  . Not on file   Social Determinants of Health   Financial Resource Strain:   . Difficulty of Paying Living Expenses: Not on file  Food Insecurity:   . Worried About Charity fundraiser in the Last Year: Not on file  . Ran Out of Food in the Last Year: Not on file  Transportation Needs:   . Lack of Transportation (Medical): Not on file  . Lack of Transportation (Non-Medical): Not on file  Physical Activity:   . Days of Exercise per Week: Not on file  . Minutes of  Exercise per Session: Not on file  Stress:   . Feeling of Stress : Not on file  Social Connections:   . Frequency of Communication with Friends and Family: Not on file  . Frequency of Social Gatherings with Friends and Family: Not on file  . Attends Religious Services: Not on file  . Active Member of Clubs or Organizations: Not on file  . Attends Archivist Meetings: Not on file  . Marital Status: Not on file  Intimate Partner Violence:   . Fear of Current or Ex-Partner: Not on file  . Emotionally Abused: Not on file  . Physically Abused: Not on file  . Sexually Abused: Not on file   Family History  Problem Relation Age of Onset  . Heart disease Father 32       Heart disease before age 39  . Heart attack Father   . Hypertension Father   . Hyperlipidemia Father   . Varicose Veins Father   . Stroke Father        ? not sure  . Colon cancer Maternal Aunt   . Colon cancer Maternal Uncle   . Diverticulitis Other   . Heart attack Other    ROS: All systems reviewed and negative except as per HPI.   Current Outpatient Medications  Medication Sig Dispense Refill  . amiodarone (PACERONE) 200 MG tablet Take 1 tablet (200 mg total) by mouth daily. 30 tablet 4  . apixaban (ELIQUIS) 2.5 MG TABS tablet Take 1 tablet (2.5 mg total) by mouth 2 (two) times daily. 180 tablet 1  . calcium-vitamin D (OSCAL WITH D) 500-200 MG-UNIT per tablet Take 1 tablet by mouth daily.     . Cholecalciferol (VITAMIN D3) 2000 UNITS TABS Take 2,000 Units by mouth daily. 50 mcg    . Coenzyme Q10 (CO Q 10) 100 MG CAPS Take 300 mg by mouth daily.     . fish oil-omega-3 fatty acids 1000 MG capsule Take 1 g by mouth daily.      [name] gabapentin (NEURONTIN) 300 MG capsule Take 300 mg by mouth 2 (two) times daily as needed (Nerve pain).     [name] glipiZIDE (GLUCOTROL) 10 MG tablet Take 10 mg by mouth 2 (two) times daily before a meal.    . metFORMIN (GLUCOPHAGE) 500 MG tablet Take 2 tablets (1,000 mg total) by mouth 2  (two) times daily with a meal.    . metoprolol succinate (TOPROL-XL) 25 MG 24 hr tablet Take 1 tablet (25 mg total) by mouth daily. 90 tablet 3  . Multiple Vitamin (MULTI-DAY VITAMINS PO) Take 1 tablet by mouth daily.     [name] omeprazole (  PRILOSEC) 20 MG capsule Take 20 mg by mouth 2 (two) times daily before a meal.     . simvastatin (ZOCOR) 20 MG tablet Take 20 mg by mouth at bedtime.     . torsemide (DEMADEX) 20 MG tablet Take 2 tablets (40 mg total) by mouth every morning AND 1 tablet (20 mg total) every evening. 120 tablet 6  . Turmeric 450 MG CAPS Take 450 mg by mouth daily.     . vitamin C (ASCORBIC ACID) 250 MG tablet Take 250 mg by mouth 2 (two) times daily.     . vitamin E 1000 UNIT capsule Take 1,000 Units by mouth every other day.     No current facility-administered medications for this encounter.   BP 118/64   Pulse 63   Wt 69.6 kg (153 lb 8 oz)   SpO2 99%   BMI 21.41 kg/m  General: NAD Neck: No JVD, no thyromegaly or thyroid nodule.  Lungs: Clear to auscultation bilaterally with normal respiratory effort. CV: Nondisplaced PMI.  Heart regular S1/S2, no S3/S4, no murmur.  No peripheral edema.  No carotid bruit.  Normal pedal pulses.  Abdomen: Soft, nontender, no hepatosplenomegaly, no distention.  Skin: Intact without lesions or rashes.  Neurologic: Alert and oriented x 3.  Psych: Normal affect. Extremities: No clubbing or cyanosis.  HEENT: Normal.    Assessment/Plan: 1. Chronic systolic CHF: Ischemic cardiomyopathy.  Echo in 8/20 with EF 20-25%, moderately decreased RV systolic function.  St Jude ICD.  CHF has been complicated by CKD stage 3 with recent AKI.  RHC in 2/21 showed low but not markedly low cardiac output, he was admitted for diuresis with milrinone.  Milrinone was weaned off.  He has lost 26 lbs since last appointment and looks euvolemic.  Symptoms improved, NYHA class II.   - He will stay off Entresto and spironolactone with AKI.  - Continue Toprol XL 25 mg  daily.  - I will decrease torsemide to 40 qam/20 qpm.  BMET today.  - Narrow QRS, not candidate for upgrade to CRT.  - PYP scan in 3/21 was equivocal (grade 1 but with H/CL 1.67).  Myeloma workup negative.  I will repeat PYP scan in a couple of months (5/21).  - Refer for cardiac rehab.  2. CAD: S/p CABG 2007, patent grafts on LHC in 2016. No exertional chest pain.  - Given stable CAD and Eliquis use, he is not on ASA.  - Continue statin.  3. Atrial fibrillation: Paroxysmal.  He is now back in NSR post-DCCV.  He feels much better in NSR.   - Continue amiodarone 200 mg daily.  Check LFTs and TSH today.  He will need a regular eye exam while on amiodarone.  4. Right chronic pleural effusion: Only small effusion on recent CXR.  5. CKD stage 3 with recent AKI: BMET today.   6. Carotid stenosis: Repeat dopplers in 8/21.    Followup in 1 month  [name] 06/03/2019

## 2019-06-04 ENCOUNTER — Telehealth (HOSPITAL_COMMUNITY): Payer: Self-pay

## 2019-06-04 DIAGNOSIS — I509 Heart failure, unspecified: Secondary | ICD-10-CM

## 2019-06-04 MED ORDER — TORSEMIDE 20 MG PO TABS: 40.0000 mg | ORAL_TABLET | Freq: Every day | ORAL | 6 refills | Status: DC

## 2019-06-04 NOTE — Telephone Encounter (Signed)
Pt made aware of his lab results. Aware of med change. Verbalized understanding. Lab appt made for 1 week. No further questions endorsed.

## 2019-06-04 NOTE — Telephone Encounter (Signed)
-----   Message from Larey Dresser, MD sent at 06/04/2019  2:25 PM EST ----- Hold torsemide for 1 day then decrease to 40 mg daily.  BMET in 1 week.

## 2019-06-05 ENCOUNTER — Telehealth (HOSPITAL_COMMUNITY): Payer: Self-pay

## 2019-06-05 NOTE — Telephone Encounter (Signed)
Pt insurance is active and benefits verified through [employer]. Co-pay $15.00, DED $0.00/$0.00 met, out of pocket $3,400.00/$994.25 met, co-insurance 0%. No pre-authorization required. George/[employer], 06/05/19 @ 11:16AM, LTE#[id number]  Will contact patient to see if he is interested in the Cardiac Rehab Program.

## 2019-06-05 NOTE — Telephone Encounter (Signed)
Called patient to see if he is interested in the Cardiac Rehab Program. Patient expressed interest. [name] over insurance, patient verbalized understanding.   Pt stated he was out and about at the time of the call but will call back later to schedule. Will place pt ppw back in ready to schedule black bin on the wall.

## 2019-06-05 NOTE — Telephone Encounter (Signed)
Patient called back and was interested in participating in the Cardiac Rehab Program. Patient will come in for orientation on 06/18/2019@10 :00am and will attend the 10:45am exercise class.  Mailed homework package.

## 2019-06-05 NOTE — Telephone Encounter (Signed)
Had to call pt back and change his cardiac rehab appointment times. Called patient to see if he was interested in participating in the Cardiac Rehab Program. Patient stated yes. Patient will come in for orientation on 06/30/2019@10 :00am and will attend the 11:00am exercise class.  Mailed homework package.

## 2019-06-10 ENCOUNTER — Encounter (HOSPITAL_COMMUNITY): Payer: PPO | Admitting: Cardiology

## 2019-06-11 ENCOUNTER — Ambulatory Visit (HOSPITAL_COMMUNITY)
Admission: RE | Admit: 2019-06-11 | Discharge: 2019-06-11 | Disposition: A | Discharge status: Home / Self Care | Payer: PPO | Source: Ambulatory Visit | Attending: Internal Medicine | Admitting: Internal Medicine

## 2019-06-11 ENCOUNTER — Encounter (HOSPITAL_COMMUNITY): Payer: Self-pay | Admitting: *Deleted

## 2019-06-11 ENCOUNTER — Other Ambulatory Visit: Payer: Self-pay

## 2019-06-11 DIAGNOSIS — I509 Heart failure, unspecified: Secondary | ICD-10-CM | POA: Insufficient documentation

## 2019-06-11 LAB — BASIC METABOLIC PANEL
Anion gap: 17 — ABNORMAL HIGH (ref 5–15)
BUN: 85 mg/dL — ABNORMAL HIGH (ref 8–23)
CO2: 28 mmol/L (ref 22–32)
Calcium: 9.8 mg/dL (ref 8.9–10.3)
Chloride: 93 mmol/L — ABNORMAL LOW (ref 98–111)
Creatinine, Ser: 3.64 mg/dL — ABNORMAL HIGH (ref 0.61–1.24)
GFR calc Af Amer: 17 mL/min — ABNORMAL LOW (ref >60)
GFR calc non Af Amer: 15 mL/min — ABNORMAL LOW (ref >60)
Glucose, Bld: 224 mg/dL — ABNORMAL HIGH (ref 70–99)
Potassium: 4.6 mmol/L (ref 3.5–5.1)
Sodium: 138 mmol/L (ref 135–145)

## 2019-06-16 ENCOUNTER — Ambulatory Visit (INDEPENDENT_AMBULATORY_CARE_PROVIDER_SITE_OTHER): Payer: PPO | Admitting: *Deleted

## 2019-06-16 DIAGNOSIS — H43812 Vitreous degeneration, left eye: Secondary | ICD-10-CM | POA: Diagnosis not present

## 2019-06-16 DIAGNOSIS — I255 Ischemic cardiomyopathy: Secondary | ICD-10-CM

## 2019-06-16 DIAGNOSIS — H35371 Puckering of macula, right eye: Secondary | ICD-10-CM | POA: Diagnosis not present

## 2019-06-16 DIAGNOSIS — E113593 Type 2 diabetes mellitus with proliferative diabetic retinopathy without macular edema, bilateral: Secondary | ICD-10-CM | POA: Diagnosis not present

## 2019-06-16 DIAGNOSIS — H3582 Retinal ischemia: Secondary | ICD-10-CM | POA: Diagnosis not present

## 2019-06-16 LAB — CUP PACEART REMOTE DEVICE CHECK
Battery Remaining Longevity: 83 mo
Battery Remaining Percentage: 82 %
Battery Voltage: 2.99 V
Brady Statistic RV Percent Paced: 1 %
Date Time Interrogation Session: 20210323020016
HighPow Impedance: 71 Ohm
HighPow Impedance: 71 Ohm
Implantable Lead Implant Date: 20190319
Implantable Lead Location: 753860
Implantable Pulse Generator Implant Date: 20190319
Lead Channel Impedance Value: 350 Ohm
Lead Channel Pacing Threshold Amplitude: 0.5 V
Lead Channel Pacing Threshold Pulse Width: 0.5 ms
Lead Channel Sensing Intrinsic Amplitude: 8.6 mV
Lead Channel Setting Pacing Amplitude: 2.5 V
Lead Channel Setting Pacing Pulse Width: 0.5 ms
Lead Channel Setting Sensing Sensitivity: 0.5 mV
Pulse Gen Serial Number: 9811279

## 2019-06-17 NOTE — Progress Notes (Signed)
ICD Remote  

## 2019-06-18 ENCOUNTER — Ambulatory Visit (HOSPITAL_COMMUNITY): Payer: PPO

## 2019-06-22 ENCOUNTER — Encounter (HOSPITAL_COMMUNITY): Payer: PPO

## 2019-06-23 DIAGNOSIS — I429 Cardiomyopathy, unspecified: Secondary | ICD-10-CM | POA: Diagnosis not present

## 2019-06-24 ENCOUNTER — Encounter (HOSPITAL_COMMUNITY): Payer: PPO

## 2019-06-26 ENCOUNTER — Encounter (HOSPITAL_COMMUNITY): Payer: PPO

## 2019-06-28 ENCOUNTER — Telehealth (HOSPITAL_COMMUNITY): Payer: Self-pay

## 2019-06-29 ENCOUNTER — Telehealth (HOSPITAL_COMMUNITY): Payer: Self-pay | Admitting: *Deleted

## 2019-06-29 ENCOUNTER — Encounter (HOSPITAL_COMMUNITY): Payer: PPO

## 2019-06-29 NOTE — Telephone Encounter (Signed)
PC to pt to confirm appt for CR orientation and complete health history.  No answer.  VM left.

## 2019-06-29 NOTE — Telephone Encounter (Signed)
Cardiac Rehab Medication Review by a Pharmacist  Does the patient feel that his/her medications are working for him/her?  yes  Has the patient been experiencing any side effects to the medications prescribed?  no  Does the patient measure his/her own blood pressure or blood glucose at home?  no   Does the patient have any problems obtaining medications due to transportation or finances?   no  Understanding of regimen: good Understanding of indications: good Potential of compliance: good  Pharmacist comments: This patient has no complaints about his medications, reports good adherence, and no issues obtaining any of his medications at this time.   [name], PharmD PGY1 Pharmacy Resident

## 2019-06-30 ENCOUNTER — Telehealth (HOSPITAL_COMMUNITY): Payer: Self-pay | Admitting: *Deleted

## 2019-06-30 ENCOUNTER — Encounter (HOSPITAL_COMMUNITY): Payer: Self-pay

## 2019-06-30 ENCOUNTER — Other Ambulatory Visit: Payer: Self-pay

## 2019-06-30 ENCOUNTER — Encounter (HOSPITAL_COMMUNITY)
Admission: RE | Admit: 2019-06-30 | Discharge: 2019-06-30 | Disposition: A | Discharge status: Home / Self Care | Payer: PPO | Source: Ambulatory Visit | Attending: Cardiology | Admitting: Cardiology

## 2019-06-30 VITALS — BP 96/50 | HR 50 | Temp 96.8°F | Resp 18 | Ht 70.0 in | Wt 155.2 lb

## 2019-06-30 DIAGNOSIS — I5022 Chronic systolic (congestive) heart failure: Secondary | ICD-10-CM | POA: Insufficient documentation

## 2019-06-30 DIAGNOSIS — E1122 Type 2 diabetes mellitus with diabetic chronic kidney disease: Secondary | ICD-10-CM | POA: Insufficient documentation

## 2019-06-30 DIAGNOSIS — I130 Hypertensive heart and chronic kidney disease with heart failure and stage 1 through stage 4 chronic kidney disease, or unspecified chronic kidney disease: Secondary | ICD-10-CM | POA: Insufficient documentation

## 2019-06-30 DIAGNOSIS — N184 Chronic kidney disease, stage 4 (severe): Secondary | ICD-10-CM | POA: Insufficient documentation

## 2019-06-30 DIAGNOSIS — Z7901 Long term (current) use of anticoagulants: Secondary | ICD-10-CM | POA: Insufficient documentation

## 2019-06-30 DIAGNOSIS — Z951 Presence of aortocoronary bypass graft: Secondary | ICD-10-CM | POA: Insufficient documentation

## 2019-06-30 DIAGNOSIS — Z79899 Other long term (current) drug therapy: Secondary | ICD-10-CM | POA: Insufficient documentation

## 2019-06-30 DIAGNOSIS — Z7984 Long term (current) use of oral hypoglycemic drugs: Secondary | ICD-10-CM | POA: Insufficient documentation

## 2019-06-30 DIAGNOSIS — I2510 Atherosclerotic heart disease of native coronary artery without angina pectoris: Secondary | ICD-10-CM | POA: Insufficient documentation

## 2019-06-30 NOTE — Progress Notes (Signed)
Cardiac Individual Treatment Plan  Patient Details  Name: [name]. MRN: [id number] Date of Birth: 16-Dec-1935 Referring Provider:     CARDIAC REHAB PHASE II ORIENTATION from 06/30/2019 in [place]  Referring Provider  Larey Dresser, MD      Initial Encounter Date:    CARDIAC REHAB PHASE II ORIENTATION from 06/30/2019 in [place]  Date  06/30/19      Visit Diagnosis: Chronic systolic congestive heart failure ([place])  Patient's Home Medications on Admission:  Current Outpatient Medications:  .  amiodarone (PACERONE) 200 MG tablet, Take 1 tablet (200 mg total) by mouth daily., Disp: 30 tablet, Rfl: 4 .  apixaban (ELIQUIS) 2.5 MG TABS tablet, Take 1 tablet (2.5 mg total) by mouth 2 (two) times daily., Disp: 180 tablet, Rfl: 1 .  calcium-vitamin D (OSCAL WITH D) 500-200 MG-UNIT per tablet, Take 1 tablet by mouth daily. , Disp: , Rfl:  .  Cholecalciferol (VITAMIN D3) 2000 UNITS TABS, Take 2,000 Units by mouth daily. 50 mcg, Disp: , Rfl:  .  Coenzyme Q10 (CO Q 10) 100 MG CAPS, Take 300 mg by mouth daily. , Disp: , Rfl:  .  fish oil-omega-3 fatty acids 1000 MG capsule, Take 1 g by mouth daily.  , Disp: , Rfl:  .  gabapentin (NEURONTIN) 300 MG capsule, Take 300 mg by mouth 2 (two) times daily as needed (Nerve pain). , Disp: , Rfl:  .  glipiZIDE (GLUCOTROL) 10 MG tablet, Take 10 mg by mouth 2 (two) times daily before a meal., Disp: , Rfl:  .  metFORMIN (GLUCOPHAGE) 500 MG tablet, Take 2 tablets (1,000 mg total) by mouth 2 (two) times daily with a meal., Disp: , Rfl:  .  metoprolol succinate (TOPROL-XL) 25 MG 24 hr tablet, Take 1 tablet (25 mg total) by mouth daily., Disp: 90 tablet, Rfl: 3 .  Multiple Vitamin (MULTI-DAY VITAMINS PO), Take 1 tablet by mouth daily. , Disp: , Rfl:  .  omeprazole (PRILOSEC) 20 MG capsule, Take 20 mg by mouth 2 (two) times daily before a meal. , Disp: , Rfl:  .  simvastatin (ZOCOR) 20 MG  tablet, Take 20 mg by mouth at bedtime. , Disp: , Rfl:  .  torsemide (DEMADEX) 20 MG tablet, Take 2 tablets (40 mg total) by mouth daily., Disp: 60 tablet, Rfl: 6 .  Turmeric 450 MG CAPS, Take 450 mg by mouth daily. , Disp: , Rfl:  .  vitamin C (ASCORBIC ACID) 250 MG tablet, Take 250 mg by mouth 2 (two) times daily. , Disp: , Rfl:  .  vitamin E 1000 UNIT capsule, Take 1,000 Units by mouth every other day., Disp: , Rfl:   Past Medical History: Past Medical History:  Diagnosis Date  . ANEMIA, HX OF   . Bronchial pneumonia Jan. 2016  . CAD, ARTERY BYPASS GRAFT   . CAROTID ARTERY STENOSIS   . Cataract   . DIABETES MELLITUS, TYPE II   . Diabetic retinal damage of right eye ([place]) 2015  . Diverticulosis   . HYPERCHOLESTEROLEMIA   . HYPERTENSION   . Myocardial infarction [facility]) June 2007  . New onset atrial fibrillation ([place]) 06/24/2014  . PULMONARY HYPERTENSION   . RENAL INSUFFICIENCY, CHRONIC   . UNSPECIFIED THROMBOCYTOPENIA     Tobacco Use: Social History   Tobacco Use  Smoking Status Former Smoker  . Types: Cigarettes  . Quit date: 03/26/1970  . Years since quitting: 57.2  Smokeless Tobacco Never Used    Labs: Recent Review Flowsheet Data    Labs for ITP Cardiac and Pulmonary Rehab Latest Ref Rng & Units 05/22/2019 05/22/2019 05/23/2019   Hemoglobin A1c 4.8 - 5.6 % - - 7.6(H)   HCO3 20.0 - 28.0 mmol/L 28.0 27.7 -   TCO2 22 - 32 mmol/L 29 29 -   O2SAT % 55.0 58.0 -      Capillary Blood Glucose: Lab Results  Component Value Date   GLUCAP 251 (H) 05/25/2019   GLUCAP 165 (H) 05/25/2019   GLUCAP 251 (H) 05/24/2019   GLUCAP 150 (H) 05/24/2019   GLUCAP 66 (L) 05/24/2019     Exercise Target Goals: Exercise Program Goal: Individual exercise prescription set using results from initial 6 min walk test and THRR while considering  patient's activity barriers and safety.   Exercise Prescription Goal: Initial exercise prescription builds to 30-45 minutes a day of aerobic  activity, 2-3 days per week.  Home exercise guidelines will be given to patient during program as part of exercise prescription that the participant will acknowledge.  Activity Barriers & Risk Stratification: Activity Barriers & Cardiac Risk Stratification - 06/30/19 1034      Activity Barriers & Cardiac Risk Stratification   Activity Barriers  Shortness of Breath;Balance Concerns    Cardiac Risk Stratification  High       6 Minute Walk: 6 Minute Walk    Row Name 06/30/19 1044         6 Minute Walk   Phase  Initial     Distance  854 feet     Walk Time  6 minutes     # of Rest Breaks  0     MPH  1.62     METS  1.58     RPE  11     Perceived Dyspnea   0     VO2 Peak  5.54     Symptoms  No     Resting HR  48 bpm     Resting BP  96/50     Resting Oxygen Saturation   100 %     Exercise Oxygen Saturation  during 6 min walk  100 %     Max Ex. HR  69 bpm     Max Ex. BP  127/59     2 Minute Post BP  136/56        Oxygen Initial Assessment:   Oxygen Re-Evaluation:   Oxygen Discharge (Final Oxygen Re-Evaluation):   Initial Exercise Prescription: Initial Exercise Prescription - 06/30/19 1200      Date of Initial Exercise RX and Referring Provider   Date  06/30/19    Referring Provider  Larey Dresser, MD    Expected Discharge Date  08/28/19      Recumbant Bike   Level  2    Watts  10    Minutes  15    METs  2.45      Arm Ergometer   Level  1    Watts  10    Minutes  15    METs  1.75      Prescription Details   Frequency (times per week)  3    Duration  Progress to 30 minutes of continuous aerobic without signs/symptoms of physical distress      Intensity   THRR 40-80% of Max Heartrate  56-111    Ratings of Perceived Exertion  11-13    Perceived Dyspnea  0-4  Progression   Progression  Continue to progress workloads to maintain intensity without signs/symptoms of physical distress.      Resistance Training   Training Prescription  Yes     Weight  3lbs.    Reps  10-15       Perform Capillary Blood Glucose checks as needed.  Exercise Prescription Changes:   Exercise Comments:   Exercise Goals and Review:  Exercise Goals    Row Name 06/30/19 1035             Exercise Goals   Increase Physical Activity  Yes       Intervention  Provide advice, education, support and counseling about physical activity/exercise needs.;Develop an individualized exercise prescription for aerobic and resistive training based on initial evaluation findings, risk stratification, comorbidities and participant's personal goals.       Expected Outcomes  Short Term: Attend rehab on a regular basis to increase amount of physical activity.;Long Term: Exercising regularly at least 3-5 days a week.;Long Term: Add in home exercise to make exercise part of routine and to increase amount of physical activity.       Increase Strength and Stamina  Yes       Intervention  Provide advice, education, support and counseling about physical activity/exercise needs.;Develop an individualized exercise prescription for aerobic and resistive training based on initial evaluation findings, risk stratification, comorbidities and participant's personal goals.       Expected Outcomes  Short Term: Increase workloads from initial exercise prescription for resistance, speed, and METs.;Short Term: Perform resistance training exercises routinely during rehab and add in resistance training at home;Long Term: Improve cardiorespiratory fitness, muscular endurance and strength as measured by increased METs and functional capacity (6MWT)       Able to understand and use rate of perceived exertion (RPE) scale  Yes       Intervention  Provide education and explanation on how to use RPE scale       Expected Outcomes  Short Term: Able to use RPE daily in rehab to express subjective intensity level;Long Term:  Able to use RPE to guide intensity level when exercising independently        Knowledge and understanding of Target Heart Rate Range (THRR)  Yes       Intervention  Provide education and explanation of THRR including how the numbers were predicted and where they are located for reference       Expected Outcomes  Short Term: Able to state/look up THRR;Long Term: Able to use THRR to govern intensity when exercising independently;Short Term: Able to use daily as guideline for intensity in rehab       Able to check pulse independently  Yes       Intervention  Provide education and demonstration on how to check pulse in carotid and radial arteries.;Review the importance of being able to check your own pulse for safety during independent exercise       Expected Outcomes  Short Term: Able to explain why pulse checking is important during independent exercise;Long Term: Able to check pulse independently and accurately       Understanding of Exercise Prescription  Yes       Intervention  Provide education, explanation, and written materials on patient's individual exercise prescription       Expected Outcomes  Short Term: Able to explain program exercise prescription;Long Term: Able to explain home exercise prescription to exercise independently          Exercise Goals  Re-Evaluation :   Discharge Exercise Prescription (Final Exercise Prescription Changes):   Nutrition:  Target Goals: Understanding of nutrition guidelines, daily intake of sodium 1500mg , cholesterol 200mg , calories 30% from fat and 7% or less from saturated fats, daily to have 5 or more servings of fruits and vegetables.  Biometrics: Pre Biometrics - 06/30/19 1015      Pre Biometrics   Waist Circumference  34 inches    Hip Circumference  38.5 inches    Waist to Hip Ratio  0.88 %    Triceps Skinfold  9 mm    % Body Fat  21 %    Grip Strength  37 kg    Flexibility  0 in    Single Leg Stand  0 seconds        Nutrition Therapy Plan and Nutrition Goals:   Nutrition Assessments:   Nutrition Goals  Re-Evaluation:   Nutrition Goals Re-Evaluation:   Nutrition Goals Discharge (Final Nutrition Goals Re-Evaluation):   Psychosocial: Target Goals: Acknowledge presence or absence of significant depression and/or stress, maximize coping skills, provide positive support system. Participant is able to verbalize types and ability to use techniques and skills needed for reducing stress and depression.  Initial Review & Psychosocial Screening: Initial Psych Review & Screening - 06/30/19 1129      Initial Review   Current issues with  Current Stress Concerns    Source of Stress Concerns  Family;Chronic Illness;Unable to perform yard/household activities    Comments  Mr. Somerville states he has a disabled son that is bed/chair bound, that lives in the home. Mr. Blanchard states his wife is not healthy herself and unable to assist with sons care. Son has CAPS however Mr. Witucki states he was up last night with son because CAPS worker did not show up. "we have some good caregivers and some bad ones". "Thank goodness I am not a depressed person". Mr. Mcfarlane states he does have family and friends that will provide meals for the family as well as emotional support. He denies need for intervention at this time.      Family Dynamics   Good Support System?  Yes    Concerns  Inappropriate over/under dependence on family/friends    Comments  under dependence related to sons care      Barriers   Psychosocial barriers to participate in program  The patient should benefit from training in stress management and relaxation.      Screening Interventions   Interventions  Encouraged to exercise;Provide feedback about the scores to participant;To provide support and resources with identified psychosocial needs    Expected Outcomes  Short Term goal: Utilizing psychosocial counselor, staff and physician to assist with identification of specific Stressors or current issues interfering with healing process. Setting desired  goal for each stressor or current issue identified.;Long Term Goal: Stressors or current issues are controlled or eliminated.;Short Term goal: Identification and review with participant of any Quality of Life or Depression concerns found by scoring the questionnaire.;Long Term goal: The participant improves quality of Life and PHQ9 Scores as seen by post scores and/or verbalization of changes       Quality of Life Scores:  Scores of 19 and below usually indicate a poorer quality of life in these areas.  A difference of  2-3 points is a clinically meaningful difference.  A difference of 2-3 points in the total score of the Quality of Life Index has been associated with significant improvement in overall quality of life,  self-image, physical symptoms, and general health in studies assessing change in quality of life.  PHQ-9: Recent Review Flowsheet Data    Depression screen [facility] 2/9 06/30/2019   Decreased Interest 0   Down, Depressed, Hopeless 0   PHQ - 2 Score 0     Interpretation of Total Score  Total Score Depression Severity:  1-4 = Minimal depression, 5-9 = Mild depression, 10-14 = Moderate depression, 15-19 = Moderately severe depression, 20-27 = Severe depression   Psychosocial Evaluation and Intervention:   Psychosocial Re-Evaluation:   Psychosocial Discharge (Final Psychosocial Re-Evaluation):   Vocational Rehabilitation: Provide vocational rehab assistance to qualifying candidates.   Vocational Rehab Evaluation & Intervention:   Education: Education Goals: Education classes will be provided on a weekly basis, covering required topics. Participant will state understanding/return demonstration of topics presented.  Learning Barriers/Preferences:   Education Topics: Count Your Pulse:  -Group instruction provided by verbal instruction, demonstration, patient participation and written materials to support subject.  Instructors address importance of being able to find your  pulse and how to count your pulse when at home without a heart monitor.  Patients get hands on experience counting their pulse with staff help and individually.   Heart Attack, Angina, and Risk Factor Modification:  -Group instruction provided by verbal instruction, video, and written materials to support subject.  Instructors address signs and symptoms of angina and heart attacks.    Also discuss risk factors for heart disease and how to make changes to improve heart health risk factors.   Functional Fitness:  -Group instruction provided by verbal instruction, demonstration, patient participation, and written materials to support subject.  Instructors address safety measures for doing things around the house.  Discuss how to get up and down off the floor, how to pick things up properly, how to safely get out of a chair without assistance, and balance training.   Meditation and Mindfulness:  -Group instruction provided by verbal instruction, patient participation, and written materials to support subject.  Instructor addresses importance of mindfulness and meditation practice to help reduce stress and improve awareness.  Instructor also leads participants through a meditation exercise.    Stretching for Flexibility and Mobility:  -Group instruction provided by verbal instruction, patient participation, and written materials to support subject.  Instructors lead participants through series of stretches that are designed to increase flexibility thus improving mobility.  These stretches are additional exercise for major muscle groups that are typically performed during regular warm up and cool down.   Hands Only CPR:  -Group verbal, video, and participation provides a basic overview of AHA guidelines for community CPR. Role-play of emergencies allow participants the opportunity to practice calling for help and chest compression technique with discussion of AED use.   Hypertension: -Group verbal  and written instruction that provides a basic overview of hypertension including the most recent diagnostic guidelines, risk factor reduction with self-care instructions and medication management.    Nutrition I class: Heart Healthy Eating:  -Group instruction provided by PowerPoint slides, verbal discussion, and written materials to support subject matter. The instructor gives an explanation and review of the Therapeutic Lifestyle Changes diet recommendations, which includes a discussion on lipid goals, dietary fat, sodium, fiber, plant stanol/sterol esters, sugar, and the components of a well-balanced, healthy diet.   Nutrition II class: Lifestyle Skills:  -Group instruction provided by PowerPoint slides, verbal discussion, and written materials to support subject matter. The instructor gives an explanation and review of label reading, grocery shopping for heart  health, heart healthy recipe modifications, and ways to make healthier choices when eating out.   Diabetes Question & Answer:  -Group instruction provided by PowerPoint slides, verbal discussion, and written materials to support subject matter. The instructor gives an explanation and review of diabetes co-morbidities, pre- and post-prandial blood glucose goals, pre-exercise blood glucose goals, signs, symptoms, and treatment of hypoglycemia and hyperglycemia, and foot care basics.   Diabetes Blitz:  -Group instruction provided by PowerPoint slides, verbal discussion, and written materials to support subject matter. The instructor gives an explanation and review of the physiology behind type 1 and type 2 diabetes, diabetes medications and rational behind using different medications, pre- and post-prandial blood glucose recommendations and Hemoglobin A1c goals, diabetes diet, and exercise including blood glucose guidelines for exercising safely.    Portion Distortion:  -Group instruction provided by PowerPoint slides, verbal discussion,  written materials, and food models to support subject matter. The instructor gives an explanation of serving size versus portion size, changes in portions sizes over the last 20 years, and what consists of a serving from each food group.   Stress Management:  -Group instruction provided by verbal instruction, video, and written materials to support subject matter.  Instructors review role of stress in heart disease and how to cope with stress positively.     Exercising on Your Own:  -Group instruction provided by verbal instruction, power point, and written materials to support subject.  Instructors discuss benefits of exercise, components of exercise, frequency and intensity of exercise, and end points for exercise.  Also discuss use of nitroglycerin and activating EMS.  Review options of places to exercise outside of rehab.  Review guidelines for sex with heart disease.   Cardiac Drugs I:  -Group instruction provided by verbal instruction and written materials to support subject.  Instructor reviews cardiac drug classes: antiplatelets, anticoagulants, beta blockers, and statins.  Instructor discusses reasons, side effects, and lifestyle considerations for each drug class.   Cardiac Drugs II:  -Group instruction provided by verbal instruction and written materials to support subject.  Instructor reviews cardiac drug classes: angiotensin converting enzyme inhibitors (ACE-I), angiotensin II receptor blockers (ARBs), nitrates, and calcium channel blockers.  Instructor discusses reasons, side effects, and lifestyle considerations for each drug class.   Anatomy and Physiology of the Circulatory System:  Group verbal and written instruction and models provide basic cardiac anatomy and physiology, with the coronary electrical and arterial systems. Review of: AMI, Angina, Valve disease, Heart Failure, Peripheral Artery Disease, Cardiac Arrhythmia, Pacemakers, and the ICD.   Other Education:  -Group  or individual verbal, written, or video instructions that support the educational goals of the cardiac rehab program.   Holiday Eating Survival Tips:  -Group instruction provided by PowerPoint slides, verbal discussion, and written materials to support subject matter. The instructor gives patients tips, tricks, and techniques to help them not only survive but enjoy the holidays despite the onslaught of food that accompanies the holidays.   Knowledge Questionnaire Score:   Core Components/Risk Factors/Patient Goals at Admission: Personal Goals and Risk Factors at Admission - 06/30/19 1210      Core Components/Risk Factors/Patient Goals on Admission   Diabetes  Yes    Intervention  Provide education about signs/symptoms and action to take for hypo/hyperglycemia.;Provide education about proper nutrition, including hydration, and aerobic/resistive exercise prescription along with prescribed medications to achieve blood glucose in normal ranges: Fasting glucose 65-99 mg/dL    Expected Outcomes  Short Term: Participant verbalizes understanding of the signs/symptoms  and immediate care of hyper/hypoglycemia, proper foot care and importance of medication, aerobic/resistive exercise and nutrition plan for blood glucose control.;Long Term: Attainment of HbA1C < 7%.    Heart Failure  Yes    Intervention  Provide a combined exercise and nutrition program that is supplemented with education, support and counseling about heart failure. Directed toward relieving symptoms such as shortness of breath, decreased exercise tolerance, and extremity edema.    Expected Outcomes  Improve functional capacity of life;Short term: Attendance in program 2-3 days a week with increased exercise capacity. Reported lower sodium intake. Reported increased fruit and vegetable intake. Reports medication compliance.;Short term: Daily weights obtained and reported for increase. Utilizing diuretic protocols set by physician.;Long term:  Adoption of self-care skills and reduction of barriers for early signs and symptoms recognition and intervention leading to self-care maintenance.    Hypertension  Yes    Intervention  Provide education on lifestyle modifcations including regular physical activity/exercise, weight management, moderate sodium restriction and increased consumption of fresh fruit, vegetables, and low fat dairy, alcohol moderation, and smoking cessation.;Monitor prescription use compliance.    Expected Outcomes  Short Term: Continued assessment and intervention until BP is < 140/28mm HG in hypertensive participants. < 130/15mm HG in hypertensive participants with diabetes, heart failure or chronic kidney disease.;Long Term: Maintenance of blood pressure at goal levels.    Lipids  Yes    Intervention  Provide education and support for participant on nutrition & aerobic/resistive exercise along with prescribed medications to achieve LDL 70mg , HDL >40mg .    Expected Outcomes  Short Term: Participant states understanding of desired cholesterol values and is compliant with medications prescribed. Participant is following exercise prescription and nutrition guidelines.;Long Term: Cholesterol controlled with medications as prescribed, with individualized exercise RX and with personalized nutrition plan. Value goals: LDL < 70mg , HDL > 40 mg.    Personal Goal Other  Yes    Personal Goal  Improve balance.    Intervention  Provide exercise, stretching, and resistance training guidelines to help improve strength and functional capacity to help with balance.    Expected Outcomes  Patient will increase strength and stamina to help improve balance.       Core Components/Risk Factors/Patient Goals Review:    Core Components/Risk Factors/Patient Goals at Discharge (Final Review):    ITP Comments: ITP Comments    Row Name 06/30/19 1012           ITP Comments  Dr. Fransico Him Medical Director Cardiac Re[facility]           Comments: Patient attended orientation on 06/30/2019 to review rules and guidelines for program.  Completed 6 minute walk test, Intitial ITP, and exercise prescription.  VSS. Telemetry-SB rate 46 at rest, increased to 56 at max exertion during 6 min walk test.  Asymptomatic. Telemetry strips and 6 min walk test faxed to cardiology office. Message left on provider line and awaiting call back. Safety measures and social distancing in place per CDC guidelines.

## 2019-06-30 NOTE — Telephone Encounter (Signed)
[name] with cardiac rehab called to report during orientation pt had a low heart rate. Heart rate was 50, pts bp fine, and pt was "completely asymptomatic".    Routed to [name]

## 2019-07-01 ENCOUNTER — Encounter (HOSPITAL_COMMUNITY): Payer: PPO

## 2019-07-03 ENCOUNTER — Encounter (HOSPITAL_COMMUNITY): Payer: PPO

## 2019-07-06 ENCOUNTER — Other Ambulatory Visit: Payer: Self-pay

## 2019-07-06 ENCOUNTER — Encounter (HOSPITAL_COMMUNITY): Payer: PPO

## 2019-07-06 ENCOUNTER — Encounter (HOSPITAL_COMMUNITY)
Admission: RE | Admit: 2019-07-06 | Discharge: 2019-07-06 | Disposition: A | Discharge status: Home / Self Care | Payer: PPO | Source: Ambulatory Visit | Attending: Cardiology | Admitting: Cardiology

## 2019-07-06 VITALS — Wt 162.0 lb

## 2019-07-06 DIAGNOSIS — Z951 Presence of aortocoronary bypass graft: Secondary | ICD-10-CM | POA: Diagnosis not present

## 2019-07-06 DIAGNOSIS — Z7984 Long term (current) use of oral hypoglycemic drugs: Secondary | ICD-10-CM | POA: Diagnosis not present

## 2019-07-06 DIAGNOSIS — Z7901 Long term (current) use of anticoagulants: Secondary | ICD-10-CM | POA: Diagnosis not present

## 2019-07-06 DIAGNOSIS — Z79899 Other long term (current) drug therapy: Secondary | ICD-10-CM | POA: Diagnosis not present

## 2019-07-06 DIAGNOSIS — I5022 Chronic systolic (congestive) heart failure: Secondary | ICD-10-CM

## 2019-07-06 DIAGNOSIS — I130 Hypertensive heart and chronic kidney disease with heart failure and stage 1 through stage 4 chronic kidney disease, or unspecified chronic kidney disease: Secondary | ICD-10-CM | POA: Diagnosis not present

## 2019-07-06 DIAGNOSIS — I2510 Atherosclerotic heart disease of native coronary artery without angina pectoris: Secondary | ICD-10-CM | POA: Diagnosis not present

## 2019-07-06 DIAGNOSIS — E1122 Type 2 diabetes mellitus with diabetic chronic kidney disease: Secondary | ICD-10-CM | POA: Diagnosis not present

## 2019-07-06 DIAGNOSIS — N184 Chronic kidney disease, stage 4 (severe): Secondary | ICD-10-CM | POA: Diagnosis not present

## 2019-07-06 LAB — GLUCOSE, CAPILLARY
Glucose-Capillary: 173 mg/dL — ABNORMAL HIGH (ref 70–99)
Glucose-Capillary: 147 mg/dL — ABNORMAL HIGH (ref 70–99)

## 2019-07-06 NOTE — Progress Notes (Signed)
Daily Session Note  Patient Details  Name: [name]. MRN: [id number] Date of Birth: [birth year] Referring Provider:     CARDIAC REHAB PHASE II ORIENTATION from 06/30/2019 in [facility]  Referring Provider  Larey Dresser, MD      Encounter Date: 07/06/2019  Check In: Session Check In - 07/06/19 1106      Check-In   Supervising physician immediately available to respond to emergencies  [facility] immediately available    Physician(s)  Dr. Doristine Bosworth    Location  [facility]    Staff Present  Barnet Pall, RN, Luisa Hart, RN, Deland Pretty, MS, ACSM CEP, Exercise Physiologist;Dalton Kris Mouton, MS, Exercise Physiologist    Virtual Visit  No    Medication changes reported      No    Fall or balance concerns reported     No    Tobacco Cessation  No Change    Warm-up and Cool-down  Performed as group-led instruction    Resistance Training Performed  Yes    VAD Patient?  No    PAD/SET Patient?  No      Pain Assessment   Currently in Pain?  No/denies    Multiple Pain Sites  No       Capillary Blood Glucose: Results for orders placed or performed during the hospital encounter of 07/06/19 (from the past 24 hour(s))  Glucose, capillary     Status: Abnormal   Collection Time: 07/06/19 10:59 AM  Result Value Ref Range   Glucose-Capillary 173 (H) 70 - 99 mg/dL  Glucose, capillary     Status: Abnormal   Collection Time: 07/06/19 11:52 AM  Result Value Ref Range   Glucose-Capillary 147 (H) 70 - 99 mg/dL   POCT Glucose - 07/06/19 1227      POCT Blood Glucose   Pre-Exercise  173 mg/dL    Post-Exercise  147 mg/dL      Exercise Prescription Changes - 07/06/19 1200      Response to Exercise   Blood Pressure (Admit)  102/58    Blood Pressure (Exercise)  114/70    Blood Pressure (Exit)  118/70    Heart Rate (Admit)  53 bpm    Heart Rate (Exercise)  66 bpm    Heart Rate (Exit)  49 bpm    Rating of  Perceived Exertion (Exercise)  13    Duration  Continue with 30 min of aerobic exercise without signs/symptoms of physical distress.    Intensity  THRR unchanged      Progression   Progression  Continue to progress workloads to maintain intensity without signs/symptoms of physical distress.      Resistance Training   Training Prescription  Yes    Weight  3 lbs    Reps  10-15      Recumbant Bike   Level  2    Minutes  15    METs  2.6      Arm Ergometer   Level  1    Minutes  15       Social History   Tobacco Use  Smoking Status Former Smoker  . Types: Cigarettes  . Quit date: 03/26/1970  . Years since quitting: 49.3  Smokeless Tobacco Never Used    Goals Met:  No report of cardiac concerns or symptoms Strength training completed today  Goals Unmet:  Not Applicable  Comments: Pt started cardiac rehab today.  Pt tolerated light exercise without difficulty.  VSS, telemetry-Sinus Rhythm, asymptomatic.  Medication list reconciled. Pt denies barriers to medicaiton compliance.  PSYCHOSOCIAL ASSESSMENT:  PHQ-0. Pt exhibits positive coping skills, hopeful outlook with supportive family.[name] does take care of his disabled son .    Pt oriented to exercise equipment and routine.    Understanding verbalized. [name] did seated weights and stretches. Barnet Pall, RN,BSN 07/06/2019 4:58 PM  Dr. Fransico Him is Medical Director for Cardiac Rehab at [facility].

## 2019-07-08 ENCOUNTER — Encounter (HOSPITAL_COMMUNITY): Payer: PPO

## 2019-07-08 ENCOUNTER — Encounter (HOSPITAL_COMMUNITY)
Admission: RE | Admit: 2019-07-08 | Discharge: 2019-07-08 | Disposition: A | Discharge status: Home / Self Care | Payer: PPO | Source: Ambulatory Visit | Attending: Cardiology | Admitting: Cardiology

## 2019-07-08 ENCOUNTER — Other Ambulatory Visit: Payer: Self-pay

## 2019-07-08 DIAGNOSIS — I130 Hypertensive heart and chronic kidney disease with heart failure and stage 1 through stage 4 chronic kidney disease, or unspecified chronic kidney disease: Secondary | ICD-10-CM | POA: Diagnosis not present

## 2019-07-08 DIAGNOSIS — I5022 Chronic systolic (congestive) heart failure: Secondary | ICD-10-CM

## 2019-07-08 LAB — GLUCOSE, CAPILLARY
Glucose-Capillary: 149 mg/dL — ABNORMAL HIGH (ref 70–99)
Glucose-Capillary: 111 mg/dL — ABNORMAL HIGH (ref 70–99)

## 2019-07-10 ENCOUNTER — Encounter (HOSPITAL_COMMUNITY)
Admission: RE | Admit: 2019-07-10 | Discharge: 2019-07-10 | Disposition: A | Discharge status: Home / Self Care | Payer: PPO | Source: Ambulatory Visit | Attending: Cardiology | Admitting: Cardiology

## 2019-07-10 ENCOUNTER — Encounter (HOSPITAL_COMMUNITY): Payer: PPO

## 2019-07-10 ENCOUNTER — Other Ambulatory Visit: Payer: Self-pay

## 2019-07-10 DIAGNOSIS — I5022 Chronic systolic (congestive) heart failure: Secondary | ICD-10-CM

## 2019-07-10 DIAGNOSIS — I130 Hypertensive heart and chronic kidney disease with heart failure and stage 1 through stage 4 chronic kidney disease, or unspecified chronic kidney disease: Secondary | ICD-10-CM | POA: Diagnosis not present

## 2019-07-13 ENCOUNTER — Telehealth (HOSPITAL_COMMUNITY): Payer: Self-pay | Admitting: *Deleted

## 2019-07-13 ENCOUNTER — Ambulatory Visit: Payer: PPO | Admitting: Cardiovascular Disease

## 2019-07-13 ENCOUNTER — Encounter (HOSPITAL_COMMUNITY): Payer: PPO

## 2019-07-13 ENCOUNTER — Encounter: Payer: Self-pay | Admitting: Cardiovascular Disease

## 2019-07-13 ENCOUNTER — Other Ambulatory Visit: Payer: Self-pay

## 2019-07-13 VITALS — BP 120/70 | HR 66 | Ht 71.0 in | Wt 166.4 lb

## 2019-07-13 DIAGNOSIS — E7800 Pure hypercholesterolemia, unspecified: Secondary | ICD-10-CM | POA: Diagnosis not present

## 2019-07-13 DIAGNOSIS — I5022 Chronic systolic (congestive) heart failure: Secondary | ICD-10-CM

## 2019-07-13 DIAGNOSIS — I4819 Other persistent atrial fibrillation: Secondary | ICD-10-CM

## 2019-07-13 DIAGNOSIS — I2510 Atherosclerotic heart disease of native coronary artery without angina pectoris: Secondary | ICD-10-CM | POA: Diagnosis not present

## 2019-07-13 DIAGNOSIS — I255 Ischemic cardiomyopathy: Secondary | ICD-10-CM | POA: Diagnosis not present

## 2019-07-13 NOTE — Patient Instructions (Signed)

## 2019-07-13 NOTE — Progress Notes (Signed)
Chief Complaint  Patient presents with  . Follow-up    CAD     History of Present Illness: 84 yo male with history of atrial fibrillation, ischemic cardiomyopathy s/p ICD placement, chronic systolic CHF, DM, HTN, HLD, CAD s/p 4V CABG in 2007 and carotid artery disease here today for follow up. He underwent a 4V CABG in 2007 (LIMA to LAD, SVG to OM, SVG sequential to PLA/PDA). Carotid artery disease followed in [facility]. Echo 06/17/14 showed LVEF=25-30%, akinesis of the inferoseptum and apex. No significant valve disease. Cardiac cath 05/27/14 with severe native vessel CAD and 4 patent bypass grafts. He was noted to be in atrial fibrillation the day of the cardiac cath and was started on Eliquis. He has tolerated the Eliquis well. I saw him in October 2018 and he c/o dyspnea with exertion and worsened fatigue. Echo 01/17/17 with LVEF=25%, diffuse hypokinesis of the septal and apical walls. The LV cavity is severely dilated. Severe MAC. Bilateral atrial enlargement. PA pressure 56 mmHg. He was referred to Dr. Lennie Odor in the EP clinic and an ICD was placed in March 2019. He was incidentally found to have a right pleural effusion at the time of his ICD placement and had thoracentesis May 2019. This has been followed in the pulmonary clinic. He was seen in our office October 21, 2018 by Lyda Jester, PA with c/o worsened dyspnea and lower extremity edema. Lasix was increased and he was seen back in the pulmonary office. He underwent repeat thoracentesis in August 2020. He has since followed up with Dr. Vaughan Browner. Echo 10/29/18 with LVEF=20-25%. No significant valve disease. He was seen in our office December 2020 by Robbie Lis, PA-C and was up 8 lbs with evidence of volume overload. He was diuresed but has worsened renal failure. He has been followed in the Advanced Heart Failure clinic by Dr. Aundra Dubin since February 2020. Cardioversion February 2021. He was admitted to [facility] 2021 for diuresis with milrinone  following right heart cath. Amiodarone was started.   He is here today for follow up of his coronary artery disease. The patient denies any chest pain, dyspnea, palpitations, lower extremity edema, orthopnea, PND, dizziness, near syncope or syncope. Overall feeling well.   Primary Care Physician: Shirline Frees, MD  Past Medical History:  Diagnosis Date  . ANEMIA, HX OF   . Bronchial pneumonia Jan. 2016  . CAD, ARTERY BYPASS GRAFT   . CAROTID ARTERY STENOSIS   . Cataract   . DIABETES MELLITUS, TYPE II   . Diabetic retinal damage of right eye ([place]) 2015  . Diverticulosis   . HYPERCHOLESTEROLEMIA   . HYPERTENSION   . Myocardial infarction [facility]) June 2007  . New onset atrial fibrillation ([place]) 06/24/2014  . PULMONARY HYPERTENSION   . RENAL INSUFFICIENCY, CHRONIC   . UNSPECIFIED THROMBOCYTOPENIA     Past Surgical History:  Procedure Laterality Date  . CARDIAC CATHETERIZATION  06/24/2014  . CARDIOVERSION [name] 05/11/2019   Procedure: CARDIOVERSION;  Surgeon: Larey Dresser, MD;  Location: [facility] ENDOSCOPY;  Service: Cardiovascular;  Laterality: [name];  . CATARACT EXTRACTION  2012   right eye  . COLONOSCOPY    . CORONARY ARTERY BYPASS GRAFT      quad bypass/2007  . EYE SURGERY    . ICD IMPLANT [name] 06/11/2017   Procedure: ICD IMPLANT;  Surgeon: Constance Haw, MD;  Location: [facility] CV LAB;  Service: Cardiovascular;  Laterality: [name];  . IR THORACENTESIS ASP PLEURAL SPACE W/IMG GUIDE  10/28/2018  .  LEFT HEART CATHETERIZATION WITH CORONARY/GRAFT ANGIOGRAM [name] 06/24/2014   Procedure: LEFT HEART CATHETERIZATION WITH Beatrix Fetters;  Surgeon: Burnell Blanks, MD;  Location: [facility] CATH LAB;  Service: Cardiovascular;  Laterality: [name];  . POLYPECTOMY    . RIGHT HEART CATH [name] 05/22/2019   Procedure: RIGHT HEART CATH;  Surgeon: Larey Dresser, MD;  Location: [facility] CV LAB;  Service: Cardiovascular;  Laterality: [name];  . TONSILLECTOMY      Current Outpatient  Medications  Medication Sig Dispense Refill  . amiodarone (PACERONE) 200 MG tablet Take 1 tablet (200 mg total) by mouth daily. 30 tablet 4  . apixaban (ELIQUIS) 2.5 MG TABS tablet Take 1 tablet (2.5 mg total) by mouth 2 (two) times daily. 180 tablet 1  . calcium-vitamin D (OSCAL WITH D) 500-200 MG-UNIT per tablet Take 1 tablet by mouth daily.     . Cholecalciferol (VITAMIN D3) 2000 UNITS TABS Take 2,000 Units by mouth daily. 50 mcg    . Coenzyme Q10 (CO Q 10) 100 MG CAPS Take 300 mg by mouth daily.     . fish oil-omega-3 fatty acids 1000 MG capsule Take 1 g by mouth daily.      [name] gabapentin (NEURONTIN) 300 MG capsule Take 300 mg by mouth 2 (two) times daily as needed (Nerve pain).     [name] glipiZIDE (GLUCOTROL) 10 MG tablet Take 10 mg by mouth 2 (two) times daily before a meal.    . metFORMIN (GLUCOPHAGE) 1000 MG tablet Take 1,000 mg by mouth 2 (two) times daily.    . metoprolol succinate (TOPROL-XL) 25 MG 24 hr tablet Take 1 tablet (25 mg total) by mouth daily. 90 tablet 3  . Multiple Vitamin (MULTI-DAY VITAMINS PO) Take 1 tablet by mouth daily.     [name] omeprazole (PRILOSEC) 20 MG capsule Take 20 mg by mouth 2 (two) times daily before a meal.     . simvastatin (ZOCOR) 20 MG tablet Take 20 mg by mouth at bedtime.     . torsemide (DEMADEX) 20 MG tablet Take 2 tablets (40 mg total) by mouth daily. 60 tablet 6  . Turmeric 450 MG CAPS Take 450 mg by mouth daily.     . vitamin C (ASCORBIC ACID) 250 MG tablet Take 250 mg by mouth 2 (two) times daily.     . vitamin E 1000 UNIT capsule Take 1,000 Units by mouth every other day.     No current facility-administered medications for this visit.    No Known Allergies  Social History   Socioeconomic History  . Marital status: Married    Spouse name: Not on file  . Number of children: 2  . Years of education: Not on file  . Highest education level: Not on file  Occupational History  . Occupation: Horticulturist, commercial: RETIRED  Tobacco Use  .  Smoking status: Former Smoker    Types: Cigarettes    Quit date: 03/26/1970    Years since quitting: 49.3  . Smokeless tobacco: Never Used  Substance and Sexual Activity  . Alcohol use: Yes    Alcohol/week: 1.0 standard drinks    Types: 1 Glasses of wine per week  . Drug use: No  . Sexual activity: Not on file  Other Topics Concern  . Not on file  Social History Narrative  . Not on file   Social Determinants of Health   Financial Resource Strain:   . Difficulty of Paying Living Expenses:   Food Insecurity:   .  Worried About Charity fundraiser in the Last Year:   . Arboriculturist in the Last Year:   Transportation Needs: No Transportation Needs  . Lack of Transportation (Medical): No  . Lack of Transportation (Non-Medical): No  Physical Activity: Inactive  . Days of Exercise per Week: 0 days  . Minutes of Exercise per Session: 0 min  Stress:   . Feeling of Stress :   Social Connections:   . Frequency of Communication with Friends and Family:   . Frequency of Social Gatherings with Friends and Family:   . Attends Religious Services:   . Active Member of Clubs or Organizations:   . Attends Archivist Meetings:   [name] Marital Status:   Intimate Partner Violence:   . Fear of Current or Ex-Partner:   . Emotionally Abused:   [name] Physically Abused:   . Sexually Abused:     Family History  Problem Relation Age of Onset  . Heart disease Father 50       Heart disease before age 86  . Heart attack Father   . Hypertension Father   . Hyperlipidemia Father   . Varicose Veins Father   . Stroke Father        ? not sure  . Colon cancer Maternal Aunt   . Colon cancer Maternal Uncle   . Diverticulitis Other   . Heart attack Other     Review of Systems:  As stated in the HPI and otherwise negative.   BP 120/70   Pulse 66   Ht _0  (1.803 m)   Wt 166 lb 6.4 oz (75.5 kg)   SpO2 97%   BMI 23.21 kg/m   Physical Examination:  General: Well developed, well  nourished, NAD  HEENT: OP clear, mucus membranes moist  SKIN: warm, dry. No rashes. Neuro: No focal deficits  Musculoskeletal: Muscle strength 5/5 all ext  Psychiatric: Mood and affect normal  Neck: No JVD, no carotid bruits, no thyromegaly, no lymphadenopathy.  Lungs:Clear bilaterally, no wheezes, rhonci, crackles Cardiovascular: Regular rate and rhythm. No murmurs, gallops or rubs. Abdomen:Soft. Bowel sounds present. Non-tender.  Extremities: No lower extremity edema. Pulses are 2 + in the bilateral DP/PT.  Echo 10/29/18:  1. The left ventricle has severely reduced systolic function, with an ejection fraction of 20-25%. The cavity size was normal. Left ventricular diastolic function could not be evaluated secondary to atrial fibrillation.  2. The right ventricle has moderately reduced systolic function. The cavity was mildly enlarged. There is no increase in right ventricular wall thickness.  3. Left atrial size was severely dilated.  4. Right atrial size was mildly dilated.  5. Mildly thickened tricuspid valve leaflets.  6. The aortic valve is grossly normal. No stenosis of the aortic valve. Mild to moderate aortic annular calcification noted.  7. The mitral valve is grossly normal. Mild thickening of the mitral valve leaflet. Mild calcification of the mitral valve leaflet.  8. The tricuspid valve is grossly normal.  9. The aorta is normal in size and structure. 10. The aortic root and ascending aorta are normal in size and structure. 11. The inferior vena cava was dilated in size with <50% respiratory variability. 12. The interatrial septum was not assessed.   EKG:  EKG is not ordered today. The ekg ordered today demonstrates   Recent Labs: 03/12/2019: NT-Pro BNP 7,736 05/22/2019: B Natriuretic Peptide 2,794.1 05/25/2019: Hemoglobin 11.1; Magnesium 2.1; Platelets 208 06/03/2019: ALT 30; TSH 3.453 06/11/2019: BUN 85;  Creatinine, Ser 3.64; Potassium 4.6; Sodium 138   Lipid Panel    Lipid Panel  No results found for: CHOL, TRIG, HDL, CHOLHDL, VLDL, LDLCALC, LDLDIRECT, LABVLDL Followed in primary care   Wt Readings from Last 3 Encounters:  07/13/19 166 lb 6.4 oz (75.5 kg)  07/06/19 162 lb 0.6 oz (73.5 kg)  06/30/19 155 lb 3.3 oz (70.4 kg)    Other studies Reviewed: Additional studies/ records that were reviewed today include: . Review of the above records demonstrates:  Assessment and Plan:   1. CAD s/p CABG without angina: No chest pain. Continue statin and beta blocker. No ASA since he is on Eliquis.      2. Hyperlipidemia: Lipids followed in primary care. LDL 69 August 202. Continue statin  3. Carotid artery disease: Followed in [facility].    4. HTN: BP controlled. Continue current therapy  5. Ischemic Cardiomyopathy/Chronic systolic CHF: Most recent echo August 2020 with LVEF=25%. He is now followed by Dr. Aundra Dubin in the [facility]. ICD in place. Continue torsemide and close f/u with Dr. Aundra Dubin.    6. Atrial fibrillation, persistent: Sinus today on exam. DCCV February 2021. Continue amiodarone, Toprol and Eliquis.        Current medicines are reviewed at length with the patient today.  The patient does not have concerns regarding medicines.  The following changes have been made:   Labs/ tests ordered today include:   No orders of the defined types were placed in this encounter.   Disposition:   FU with me in one year.   Signed, [name], MD 07/13/2019 10:51 AM    [address], [place], [place]  [address] Phone: [phone]; Fax: [phone]

## 2019-07-13 NOTE — Telephone Encounter (Signed)
He can increase torsemide back to 40 mg bid with BMET in 1 week and office followup.

## 2019-07-13 NOTE — Telephone Encounter (Signed)
Pt already has office visit scheduled for 4/26. Called pt no answer/left vm requesting return call

## 2019-07-13 NOTE — Telephone Encounter (Signed)
Mickel Baas from [facility] called and said she monitors patients weight and he is up 5lbs since Friday. Pt was seen by [facility] today but no increase/changes to diuretic.   Routed to [facility] for advice   Mickel Baas call back # [phone]

## 2019-07-14 MED ORDER — TORSEMIDE 20 MG PO TABS: 40.0000 mg | ORAL_TABLET | Freq: Two times a day (BID) | ORAL | 6 refills | Status: DC

## 2019-07-14 NOTE — Telephone Encounter (Signed)
Called and spoke with [name] she is aware and will communicate with pt.

## 2019-07-15 ENCOUNTER — Encounter (HOSPITAL_COMMUNITY): Payer: PPO

## 2019-07-15 ENCOUNTER — Other Ambulatory Visit: Payer: Self-pay

## 2019-07-15 ENCOUNTER — Telehealth (HOSPITAL_COMMUNITY): Payer: Self-pay | Admitting: Family Medicine

## 2019-07-15 ENCOUNTER — Other Ambulatory Visit (HOSPITAL_COMMUNITY): Payer: Self-pay | Admitting: Cardiology

## 2019-07-15 ENCOUNTER — Telehealth (HOSPITAL_COMMUNITY): Payer: Self-pay | Admitting: Cardiology

## 2019-07-15 ENCOUNTER — Encounter (HOSPITAL_COMMUNITY)
Admission: RE | Admit: 2019-07-15 | Discharge: 2019-07-15 | Disposition: A | Discharge status: Home / Self Care | Payer: PPO | Source: Ambulatory Visit | Attending: Cardiology | Admitting: Cardiology

## 2019-07-15 DIAGNOSIS — N183 Chronic kidney disease, stage 3 unspecified: Secondary | ICD-10-CM | POA: Diagnosis not present

## 2019-07-15 DIAGNOSIS — I480 Paroxysmal atrial fibrillation: Secondary | ICD-10-CM | POA: Diagnosis not present

## 2019-07-15 DIAGNOSIS — I2510 Atherosclerotic heart disease of native coronary artery without angina pectoris: Secondary | ICD-10-CM | POA: Diagnosis not present

## 2019-07-15 DIAGNOSIS — I5022 Chronic systolic (congestive) heart failure: Secondary | ICD-10-CM

## 2019-07-15 DIAGNOSIS — E1122 Type 2 diabetes mellitus with diabetic chronic kidney disease: Secondary | ICD-10-CM | POA: Diagnosis not present

## 2019-07-15 DIAGNOSIS — I130 Hypertensive heart and chronic kidney disease with heart failure and stage 1 through stage 4 chronic kidney disease, or unspecified chronic kidney disease: Secondary | ICD-10-CM | POA: Diagnosis not present

## 2019-07-15 DIAGNOSIS — I10 Essential (primary) hypertension: Secondary | ICD-10-CM | POA: Diagnosis not present

## 2019-07-15 DIAGNOSIS — E782 Mixed hyperlipidemia: Secondary | ICD-10-CM | POA: Diagnosis not present

## 2019-07-15 DIAGNOSIS — D649 Anemia, unspecified: Secondary | ICD-10-CM | POA: Diagnosis not present

## 2019-07-15 DIAGNOSIS — I129 Hypertensive chronic kidney disease with stage 1 through stage 4 chronic kidney disease, or unspecified chronic kidney disease: Secondary | ICD-10-CM | POA: Diagnosis not present

## 2019-07-15 MED ORDER — METOPROLOL SUCCINATE ER 25 MG PO TB24: 12.5000 mg | ORAL_TABLET | Freq: Every day | ORAL | 3 refills | Status: DC

## 2019-07-15 NOTE — Telephone Encounter (Signed)
Outpatient Telephone Call  Location: [facility]  Reason for Call: Fatigue and Bradycardia  Patient Profile: 84 y/o male w/ CAD s/p CABG in 1898, chronic systolic HF 2/2 ICM LVEF 42-10%, s/p ICD, PAF s/p recent DCCV 2/21, AAD therapy w/ amiodarone and chronic a/c w/ Eliquis.   HPI: received call from Verdis Frederickson, exercise physiologist, w/ outpatient cardiac rehab regarding fatigue and bradycardia. EKG shows sinus bradycardia w/ 1st degree AV block, 43 bpm. No blocks (presonally reviewed).   Only symptom is fatigue. I spoke w/ pt. He denies chest pain, has chronic exertional dyspnea at baseline but not any worse. Denies dizziness, syncope/ near syncope. He is on 25 mg of Toprol XL, which he has already taken today. Also on amiodarone 200 mg daily. BP stable at CR, 312O systolic.    Recommendations: - reduce metoprolol to 12.5 mg daily  - he has BP monitor at home and will monitor pulse rates closely and will call if any persistent HRs < 50 bpm or if development of new/worsening symptoms.  - he has close f/u w/ Dr. Aundra Dubin on 4/26, which he plans to keep - may need to reduce amiodarone dose if bradycardia persists. Will keep at 200 mg daily for now until f/u visit.  - may require complete discontinuation of metoprolol if no improvement   Lyda Jester, PA-C 07/15/2019

## 2019-07-15 NOTE — Telephone Encounter (Signed)
If HR still in 40s on lower Toprol XL, can decrease amiodarone to 100 mg daily.

## 2019-07-15 NOTE — Progress Notes (Signed)
Incomplete Session Note  Patient Details  Name: [name]. MRN: [id number] Date of Birth: 23-Jul-1935 Referring Provider:     CARDIAC REHAB PHASE II ORIENTATION from 06/30/2019 in [facility]  Referring Provider  Larey Dresser, MD      [name] did not complete his rehab session.  Patient reports that he has felt weak since Monday afternoon and does not want to participate in cardiac rehab any longer. Blood pressure 110/62 heart rate 43. Oxygen saturation 99% on room air. Patient was placed on zoll sinus brady noted in the 40's. 12 lead ECG obtained. Ellen Henri PA paged and notified. [place] reviewed the 12 lead ECG and instructed the patient to cut his Toprol XL to 12.5 once a day via telephone.. Patient states understanding. [name] denies feeling lightheaded or dizzy but knows to go to the ED if he starts feeling bad. Exit heart rate 56. Patient walked to the car without complaints or symptoms. Patient instructed to hold off on exercising until he follows up with Dr Aundra Dubin on Monday.Barnet Pall, RN,BSN 07/15/2019 12:17 PM

## 2019-07-17 ENCOUNTER — Encounter (HOSPITAL_COMMUNITY): Payer: PPO

## 2019-07-20 ENCOUNTER — Encounter (HOSPITAL_COMMUNITY): Payer: Self-pay | Admitting: Cardiology

## 2019-07-20 ENCOUNTER — Encounter (HOSPITAL_COMMUNITY): Payer: PPO

## 2019-07-20 ENCOUNTER — Ambulatory Visit (HOSPITAL_COMMUNITY)
Admission: RE | Admit: 2019-07-20 | Discharge: 2019-07-20 | Disposition: A | Discharge status: Home / Self Care | Payer: PPO | Source: Ambulatory Visit | Attending: Cardiology | Admitting: Cardiology

## 2019-07-20 ENCOUNTER — Encounter (HOSPITAL_COMMUNITY)
Admission: RE | Admit: 2019-07-20 | Discharge: 2019-07-20 | Disposition: A | Discharge status: Home / Self Care | Payer: PPO | Source: Ambulatory Visit | Attending: Cardiology | Admitting: Cardiology

## 2019-07-20 ENCOUNTER — Other Ambulatory Visit: Payer: Self-pay

## 2019-07-20 VITALS — BP 120/50 | HR 61 | Wt 162.8 lb

## 2019-07-20 DIAGNOSIS — J90 Pleural effusion, not elsewhere classified: Secondary | ICD-10-CM | POA: Insufficient documentation

## 2019-07-20 DIAGNOSIS — Z951 Presence of aortocoronary bypass graft: Secondary | ICD-10-CM | POA: Diagnosis not present

## 2019-07-20 DIAGNOSIS — N184 Chronic kidney disease, stage 4 (severe): Secondary | ICD-10-CM | POA: Insufficient documentation

## 2019-07-20 DIAGNOSIS — I480 Paroxysmal atrial fibrillation: Secondary | ICD-10-CM | POA: Diagnosis not present

## 2019-07-20 DIAGNOSIS — I2510 Atherosclerotic heart disease of native coronary artery without angina pectoris: Secondary | ICD-10-CM | POA: Insufficient documentation

## 2019-07-20 DIAGNOSIS — I130 Hypertensive heart and chronic kidney disease with heart failure and stage 1 through stage 4 chronic kidney disease, or unspecified chronic kidney disease: Secondary | ICD-10-CM | POA: Insufficient documentation

## 2019-07-20 DIAGNOSIS — I255 Ischemic cardiomyopathy: Secondary | ICD-10-CM | POA: Diagnosis not present

## 2019-07-20 DIAGNOSIS — Z79899 Other long term (current) drug therapy: Secondary | ICD-10-CM | POA: Diagnosis not present

## 2019-07-20 DIAGNOSIS — Z7984 Long term (current) use of oral hypoglycemic drugs: Secondary | ICD-10-CM | POA: Diagnosis not present

## 2019-07-20 DIAGNOSIS — E1122 Type 2 diabetes mellitus with diabetic chronic kidney disease: Secondary | ICD-10-CM | POA: Diagnosis not present

## 2019-07-20 DIAGNOSIS — Z8249 Family history of ischemic heart disease and other diseases of the circulatory system: Secondary | ICD-10-CM | POA: Insufficient documentation

## 2019-07-20 DIAGNOSIS — Z7901 Long term (current) use of anticoagulants: Secondary | ICD-10-CM | POA: Diagnosis not present

## 2019-07-20 DIAGNOSIS — I5022 Chronic systolic (congestive) heart failure: Secondary | ICD-10-CM | POA: Diagnosis not present

## 2019-07-20 DIAGNOSIS — Z8349 Family history of other endocrine, nutritional and metabolic diseases: Secondary | ICD-10-CM | POA: Insufficient documentation

## 2019-07-20 DIAGNOSIS — E785 Hyperlipidemia, unspecified: Secondary | ICD-10-CM | POA: Diagnosis not present

## 2019-07-20 DIAGNOSIS — Z87891 Personal history of nicotine dependence: Secondary | ICD-10-CM | POA: Insufficient documentation

## 2019-07-20 LAB — COMPREHENSIVE METABOLIC PANEL
ALT: 18 U/L (ref 0–44)
AST: 22 U/L (ref 15–41)
Albumin: 3.5 g/dL (ref 3.5–5.0)
Alkaline Phosphatase: 50 U/L (ref 38–126)
Anion gap: 14 (ref 5–15)
BUN: 80 mg/dL — ABNORMAL HIGH (ref 8–23)
CO2: 28 mmol/L (ref 22–32)
Calcium: 9.2 mg/dL (ref 8.9–10.3)
Chloride: 93 mmol/L — ABNORMAL LOW (ref 98–111)
Creatinine, Ser: 3.55 mg/dL — ABNORMAL HIGH (ref 0.61–1.24)
GFR calc Af Amer: 18 mL/min — ABNORMAL LOW (ref >60)
GFR calc non Af Amer: 15 mL/min — ABNORMAL LOW (ref >60)
Glucose, Bld: 214 mg/dL — ABNORMAL HIGH (ref 70–99)
Potassium: 4.3 mmol/L (ref 3.5–5.1)
Sodium: 135 mmol/L (ref 135–145)
Total Bilirubin: 0.8 mg/dL (ref 0.3–1.2)
Total Protein: 6.8 g/dL (ref 6.5–8.1)

## 2019-07-20 LAB — GLUCOSE, CAPILLARY: Glucose-Capillary: 149 mg/dL — ABNORMAL HIGH (ref 70–99)

## 2019-07-20 MED ORDER — TORSEMIDE 20 MG PO TABS: ORAL_TABLET | ORAL | 6 refills | Status: DC

## 2019-07-20 NOTE — Progress Notes (Signed)
PCP: Shirline Frees, MD Cardiology: Dr. Angelena Form  84 y.o. with h/o CAD s/p CABG, ischemic cardiomyopathy, and diabetes was referred by Dr. Angelena Form for evaluation of CHF.  Patient had CABG in 2007.  Last cath in 2016 showed all 4 grafts patent.  Echo was 25-30% in 2016.  Most recent echo in 8/20 showed EF 20-25% with moderate RV dysfunction.  He has had a recurrent right pleural effusion with thoracenteses in 5/19 and 8/20.  He thinks that he has been in atrial fibrillation persistently for about a year.  He used to take amiodarone but stopped it because he "felt bad."  However, he does not think that stopping amiodarone made him feel any better.  Creatinine has been elevated, CKD stage 3.   At last appointment, I thought he was volume overloaded and increased Lasix, but his creatinine steadily rose (2.55 => 3.13 => 3.72). I stopped Entresto and he never started spironolactone.  Lasix was cut back to 60 mg daily. He says that during this time, he got his COVID vaccination and after that, he became severely nauseated for a number of days and ate/drank very little.  This eventually resolved.   On 05/11/19, I cardioverted him back to NSR.    Later in 2/21, he had RHC.  This showed elevated right and left heart filling pressures with low but not markedly low cardiac output.  He was admitted for IV diuresis.  While diuresing, he was maintained on milrinone.  This was weaned prior to discharge. PYP scan done in the hospital was equivocal for ATTR amyloidosis.   He returns for followup of CHF.  Weight is up about 9 lbs.  He continues to fatigue easily.  Poor balance.  Generally does ok walking on flat ground.  Doing cardiac rehab, recommended to use walker for balance.  No chest pain.  HR was low (40s) in cardiac rehab, Toprol XL was decreased. HR in 60s today.  No orthopnea/PND.    St Jude device interrogation: No VT, decreased thoracic impedance.   Labs (12/20): K 4.5, creatinine 2.01 => 1.84  Labs  (2/21): K 3.9, creatinine 2.55 => 3.13 => 3.72 Labs (3/21): hgb 11.1, K 3.6 => 4.6, creatinine 3.04 => 3.64, myeloma panel negative, urine immunofixation negative. TSH and LFTs normal.   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 Type 2 diabetes  2. HTN 3. Hyperlipidemia 4. CAD: CABG x 4 in 2007 with LIMA-LAD, SVG-OM, sequential SVG-PLV/PDA.   - LHC (2016): Patent grafts.  5. Atrial fibrillation: Persistent.  - DCCV to NSR 2/21.  6. CKD stage 4.  7. Carotid stenosis:  - Carotid dopplers (8/20): 60-79% RICA stenosis.  8. Chronic systolic CHF: Ischemic cardiomyopathy.    - Echo (2016): EF 25-30% - Echo (2018): EF 25% - Echo (8/20): EF 20-25%, moderately decreased RV systolic function, severe LAE.  - St Jude ICD.   - RHC (3/21): mean RA 18, PA 71/27, mean PCWP 24, CI 2.16 (thermo), CI 2.2 (Fick), PVR 4.6 WU, PAPi 2.4.  - PYP scan (3/21): grade 1 but H/CL ratio 1.6.  9. Pleural effusion on right: s/p thoracentesis in 5/19 and 8/20.   Social History   Socioeconomic History  . Marital status: Married    Spouse name: Not on file  . Number of children: 2  . Years of education: Not on file  . Highest education level: Not on file  Occupational History  . Occupation: Horticulturist, commercial: RETIRED  Tobacco Use  . Smoking status: Former Smoker    Types: Cigarettes    Quit date: 03/26/1970    Years since quitting: 49.3  . Smokeless tobacco: Never Used  Substance and Sexual Activity  . Alcohol use: Yes    Alcohol/week: 1.0 standard drinks    Types: 1 Glasses of wine per week  . Drug use: No  . Sexual activity: Not on file  Other Topics Concern  . Not on file  Social History Narrative  . Not on file   Social Determinants of Health   Financial Resource Strain:   . Difficulty of Paying Living Expenses:   Food Insecurity:   . Worried About Charity fundraiser in the Last Year:   . Arboriculturist in the Last Year:   Transportation Needs:  No Transportation Needs  . Lack of Transportation (Medical): No  . Lack of Transportation (Non-Medical): No  Physical Activity: Inactive  . Days of Exercise per Week: 0 days  . Minutes of Exercise per Session: 0 min  Stress:   . Feeling of Stress :   Social Connections:   . Frequency of Communication with Friends and Family:   . Frequency of Social Gatherings with Friends and Family:   . Attends Religious Services:   . Active Member of Clubs or Organizations:   . Attends Archivist Meetings:   [name] Marital Status:   Intimate Partner Violence:   . Fear of Current or Ex-Partner:   . Emotionally Abused:   [name] Physically Abused:   . Sexually Abused:    Family History  Problem Relation Age of Onset  . Heart disease Father 35       Heart disease before age 49  . Heart attack Father   . Hypertension Father   . Hyperlipidemia Father   . Varicose Veins Father   . Stroke Father        ? not sure  . Colon cancer Maternal Aunt   . Colon cancer Maternal Uncle   . Diverticulitis Other   . Heart attack Other    ROS: All systems reviewed and negative except as per HPI.   Current Outpatient Medications  Medication Sig Dispense Refill  . amiodarone (PACERONE) 200 MG tablet Take 1 tablet (200 mg total) by mouth daily. 30 tablet 4  . apixaban (ELIQUIS) 2.5 MG TABS tablet Take 1 tablet (2.5 mg total) by mouth 2 (two) times daily. 180 tablet 1  . calcium-vitamin D (OSCAL WITH D) 500-200 MG-UNIT per tablet Take 1 tablet by mouth daily.     . Cholecalciferol (VITAMIN D3) 2000 UNITS TABS Take 2,000 Units by mouth daily. 50 mcg    . Coenzyme Q10 (CO Q 10) 100 MG CAPS Take 300 mg by mouth daily.     . fish oil-omega-3 fatty acids 1000 MG capsule Take 1 g by mouth daily.      [name] gabapentin (NEURONTIN) 300 MG capsule Take 300 mg by mouth 2 (two) times daily as needed (Nerve pain).     [name] glipiZIDE (GLUCOTROL) 10 MG tablet Take 10 mg by mouth 2 (two) times daily before a meal.    . metFORMIN  (GLUCOPHAGE) 1000 MG tablet Take 1,000 mg by mouth 2 (two) times daily.    . metoprolol succinate (TOPROL-XL) 25 MG 24 hr tablet Take 0.5 tablets (12.5 mg total) by mouth daily. 90 tablet 3  . Multiple Vitamin (MULTI-DAY VITAMINS PO) Take 1 tablet by mouth daily.     [name]  omeprazole (PRILOSEC) 20 MG capsule Take 20 mg by mouth 2 (two) times daily before a meal.     . simvastatin (ZOCOR) 20 MG tablet Take 20 mg by mouth at bedtime.     . torsemide (DEMADEX) 20 MG tablet Take 3 tablets (60 mg total) by mouth every morning AND 2 tablets (40 mg total) every evening. 155 tablet 6  . Turmeric 450 MG CAPS Take 450 mg by mouth daily.     . vitamin C (ASCORBIC ACID) 250 MG tablet Take 250 mg by mouth daily.     . vitamin E 1000 UNIT capsule Take 1,000 Units by mouth every other day.     No current facility-administered medications for this encounter.   BP (!) 120/50   Pulse 61   Wt 73.8 kg (162 lb 12.8 oz)   SpO2 96%   BMI 22.71 kg/m  General: NAD Neck: JVP 8-9 cm, no thyromegaly or thyroid nodule.  Lungs: Clear to auscultation bilaterally with normal respiratory effort. CV: Nondisplaced PMI.  Heart regular S1/S2, no S3/S4, no murmur.  1+ edema 1/2 to knees bilaterally.  No carotid bruit.  Normal pedal pulses.  Abdomen: Soft, nontender, no hepatosplenomegaly, no distention.  Skin: Intact without lesions or rashes.  Neurologic: Alert and oriented x 3.  Psych: Normal affect. Extremities: No clubbing or cyanosis.  HEENT: Normal.    Assessment/Plan: 1. Chronic systolic CHF: Ischemic cardiomyopathy.  Echo in 8/20 with EF 20-25%, moderately decreased RV systolic function.  St Jude ICD.  CHF has been complicated by CKD stage 3 with recent AKI.  RHC in 2/21 showed low but not markedly low cardiac output, he was admitted for diuresis with milrinone.  Milrinone was weaned off.  Weight is up and he is volume overloaded with decreased impedance by [place]. NYHA class III.   - He will stay off Entresto and  spironolactone with elevated creatinine.  - Continue Toprol XL 12.5 mg daily (decreased due to bradycardia).  - Increase torsemide to 60 mg bid x 1 day then 60 qam/40 qpm.  BMET today and again in 10 days.   - Narrow QRS, not candidate for upgrade to CRT.  - PYP scan in 3/21 was equivocal (grade 1 but with H/CL 1.67).  Myeloma workup negative.  I will repeat PYP scan in 5/21.  - Continue cardiac rehab.  2. CAD: S/p CABG 2007, patent grafts on LHC in 2016. No exertional chest pain.  - Given stable CAD and Eliquis use, he is not on ASA.  - Continue statin.  3. Atrial fibrillation: Paroxysmal.  He remains in NSR today.   - Continue amiodarone 200 mg daily.  Check LFTs today, recent TSH was normal.  He will need a regular eye exam while on amiodarone.  4. Right chronic pleural effusion: Only small effusion on recent CXR.  5. CKD stage 4: BMET today.   - Nephrology referral.  6. Carotid stenosis: Repeat dopplers in 8/21.    Followup in 1 month with NP/PA to reassess volume status.   [name] 07/20/2019

## 2019-07-20 NOTE — Patient Instructions (Signed)
INCREASE Torsemide to 60mg  twice a day for 1 day THEN Take 60mg  (3 tabs) in the morning AND 40mg  (2 tabs) in the evening each day after that.     Labs today and repeat in 10 days We will only contact you if something comes back abnormal or we need to make some changes. Otherwise no news is good news!   You will be referred to the [facility] for your kidney disease. They will call you to schedule this appointment.    You have been ordered a PYP Scan.  This is done in the Radiology Department of [facility].  When you come for this test please plan to be there 2-3 hours.   Your physician recommends that you schedule a follow-up appointment in: 1 month with the Nurse Practitioner/Physician Assistant    Please call office at [phone] option [phone] if you have any questions or concerns.   At the [facility], you and your health needs are our priority. As part of our continuing mission to provide you with exceptional heart care, we have created designated Provider Care Teams. These Care Teams include your primary Cardiologist (physician) and Advanced Practice Providers (APPs- Physician Assistants and Nurse Practitioners) who all work together to provide you with the care you need, when you need it.   You may see any of the following providers on your designated Care Team at your next follow up: [name] Dr Glori Bickers . Dr [name] . Darrick Grinder, NP . Lyda Jester, PA . Audry Riles, PharmD   Please be sure to bring in all your medications bottles to every appointment.

## 2019-07-20 NOTE — Telephone Encounter (Signed)
Pt was seen in office today

## 2019-07-20 NOTE — Progress Notes (Signed)
[name] 84 y.o. male Nutrition Note  Visit Diagnosis: Chronic systolic congestive heart failure [facility])   Past Medical History:  Diagnosis Date  . ANEMIA, HX OF   . Bronchial pneumonia Jan. 2016  . CAD, ARTERY BYPASS GRAFT   . CAROTID ARTERY STENOSIS   . Cataract   . DIABETES MELLITUS, TYPE II   . Diabetic retinal damage of right eye ([place]) 2015  . Diverticulosis   . HYPERCHOLESTEROLEMIA   . HYPERTENSION   . Myocardial infarction [facility]) June 2007  . New onset atrial fibrillation ([place]) 06/24/2014  . PULMONARY HYPERTENSION   . RENAL INSUFFICIENCY, CHRONIC   . UNSPECIFIED THROMBOCYTOPENIA      Medications reviewed.   Current Outpatient Medications:  .  amiodarone (PACERONE) 200 MG tablet, Take 1 tablet (200 mg total) by mouth daily., Disp: 30 tablet, Rfl: 4 .  apixaban (ELIQUIS) 2.5 MG TABS tablet, Take 1 tablet (2.5 mg total) by mouth 2 (two) times daily., Disp: 180 tablet, Rfl: 1 .  calcium-vitamin D (OSCAL WITH D) 500-200 MG-UNIT per tablet, Take 1 tablet by mouth daily. , Disp: , Rfl:  .  Cholecalciferol (VITAMIN D3) 2000 UNITS TABS, Take 2,000 Units by mouth daily. 50 mcg, Disp: , Rfl:  .  Coenzyme Q10 (CO Q 10) 100 MG CAPS, Take 300 mg by mouth daily. , Disp: , Rfl:  .  fish oil-omega-3 fatty acids 1000 MG capsule, Take 1 g by mouth daily.  , Disp: , Rfl:  .  gabapentin (NEURONTIN) 300 MG capsule, Take 300 mg by mouth 2 (two) times daily as needed (Nerve pain). , Disp: , Rfl:  .  glipiZIDE (GLUCOTROL) 10 MG tablet, Take 10 mg by mouth 2 (two) times daily before a meal., Disp: , Rfl:  .  metFORMIN (GLUCOPHAGE) 1000 MG tablet, Take 1,000 mg by mouth 2 (two) times daily., Disp: , Rfl:  .  metoprolol succinate (TOPROL-XL) 25 MG 24 hr tablet, Take 0.5 tablets (12.5 mg total) by mouth daily., Disp: 90 tablet, Rfl: 3 .  Multiple Vitamin (MULTI-DAY VITAMINS PO), Take 1 tablet by mouth daily. , Disp: , Rfl:  .  omeprazole (PRILOSEC) 20 MG capsule, Take 20 mg by mouth 2 (two)  times daily before a meal. , Disp: , Rfl:  .  simvastatin (ZOCOR) 20 MG tablet, Take 20 mg by mouth at bedtime. , Disp: , Rfl:  .  torsemide (DEMADEX) 20 MG tablet, Take 3 tablets (60 mg total) by mouth every morning AND 2 tablets (40 mg total) every evening., Disp: 155 tablet, Rfl: 6 .  Turmeric 450 MG CAPS, Take 450 mg by mouth daily. , Disp: , Rfl:  .  vitamin C (ASCORBIC ACID) 250 MG tablet, Take 250 mg by mouth daily. , Disp: , Rfl:  .  vitamin E 1000 UNIT capsule, Take 1,000 Units by mouth every other day., Disp: , Rfl:    Ht Readings from Last 1 Encounters:  07/13/19 5\' 11"  (1.803 m)     Wt Readings from Last 3 Encounters:  07/20/19 162 lb 12.8 oz (73.8 kg)  07/13/19 166 lb 6.4 oz (75.5 kg)  07/06/19 162 lb 0.6 oz (73.5 kg)     There is no height or weight on file to calculate BMI.   Social History   Tobacco Use  Smoking Status Former Smoker  . Types: Cigarettes  . Quit date: 03/26/1970  . Years since quitting: 49.3  Smokeless Tobacco Never Used     No results found  for: CHOL No results found for: HDL No results found for: LDLCALC No results found for: TRIG   Lab Results  Component Value Date   HGBA1C 7.6 (H) 05/23/2019     CBG (last 3)  Recent Labs    07/20/19 1043  GLUCAP 149*     Nutrition Note  Spoke with pt. Nutrition Plan and Nutrition Survey goals reviewed with pt. Pt is following a Heart Healthy diet.   Pt has Type 2 Diabetes. Last A1c indicates blood glucose well-controlled. Pt checks CBG's 0 times a day. He now has a glucometer and states he will bring it in next session to learn how to use it. He has not checked CBGs in several years.   Pt with dx of CHF. Per discussion, pt does not use canned/convenience foods often. Pt does add salt to food. Pt does not eat out frequently.   Pt expressed understanding of the information reviewed.  Nutrition Diagnosis ? Inadequate oral intake related to poor appetite as evidenced by pt report of loss of  taste, specific foods tasting bad, and weight loss 30 lbs in past year  Nutrition Intervention ? Pt's individual nutrition plan reviewed with pt. ? Benefits of adopting Heart Healthy diet discussed when Medficts reviewed.   ? Continue client-centered nutrition education by RD, as part of interdisciplinary care.  Goal(s) ? Pt to limit sodium to 2000 mg/day  ? Pt to continue daily weights  ? Pt to drink 1-2 Glucerna per day especially when skipping a meal   Plan:   Will provide client-centered nutrition education as part of interdisciplinary care  Monitor and evaluate progress toward nutrition goal with team.   Michaele Offer, MS, RDN, LDN

## 2019-07-21 ENCOUNTER — Telehealth (HOSPITAL_COMMUNITY): Payer: Self-pay

## 2019-07-21 NOTE — Telephone Encounter (Signed)
Referral to [facility] faxed with supporting documents. Confirmation received.

## 2019-07-22 ENCOUNTER — Other Ambulatory Visit: Payer: Self-pay

## 2019-07-22 ENCOUNTER — Encounter (HOSPITAL_COMMUNITY): Payer: PPO

## 2019-07-22 ENCOUNTER — Encounter (HOSPITAL_COMMUNITY)
Admission: RE | Admit: 2019-07-22 | Discharge: 2019-07-22 | Disposition: A | Discharge status: Home / Self Care | Payer: PPO | Source: Ambulatory Visit | Attending: Cardiology | Admitting: Cardiology

## 2019-07-22 DIAGNOSIS — I5022 Chronic systolic (congestive) heart failure: Secondary | ICD-10-CM

## 2019-07-22 DIAGNOSIS — I130 Hypertensive heart and chronic kidney disease with heart failure and stage 1 through stage 4 chronic kidney disease, or unspecified chronic kidney disease: Secondary | ICD-10-CM | POA: Diagnosis not present

## 2019-07-22 LAB — GLUCOSE, CAPILLARY: Glucose-Capillary: 177 mg/dL — ABNORMAL HIGH (ref 70–99)

## 2019-07-23 DIAGNOSIS — I429 Cardiomyopathy, unspecified: Secondary | ICD-10-CM | POA: Diagnosis not present

## 2019-07-24 ENCOUNTER — Encounter (HOSPITAL_COMMUNITY): Payer: PPO

## 2019-07-24 ENCOUNTER — Encounter (HOSPITAL_COMMUNITY)
Admission: RE | Admit: 2019-07-24 | Discharge: 2019-07-24 | Disposition: A | Discharge status: Home / Self Care | Payer: PPO | Source: Ambulatory Visit | Attending: Cardiology | Admitting: Cardiology

## 2019-07-24 ENCOUNTER — Other Ambulatory Visit: Payer: Self-pay

## 2019-07-24 DIAGNOSIS — I130 Hypertensive heart and chronic kidney disease with heart failure and stage 1 through stage 4 chronic kidney disease, or unspecified chronic kidney disease: Secondary | ICD-10-CM | POA: Diagnosis not present

## 2019-07-24 DIAGNOSIS — I5022 Chronic systolic (congestive) heart failure: Secondary | ICD-10-CM

## 2019-07-24 LAB — GLUCOSE, CAPILLARY: Glucose-Capillary: 85 mg/dL (ref 70–99)

## 2019-07-24 NOTE — Progress Notes (Signed)
Incomplete Session Note  Patient Details  Name: [name]. MRN: [id number] Date of Birth: March 20, 1936 Referring Provider:     CARDIAC REHAB PHASE II ORIENTATION from 06/30/2019 in [place]  Referring Provider  Larey Dresser, MD      [name] did not complete his rehab session.  CBG 85. No exercise per protocol. Patient says that he checked his CBG prior to leaving his home it was 85. Patient was given Ginger Ale and graham crackers. [name] plans to return to exercise on Monday.Barnet Pall, RN,BSN 07/24/2019 2:03 PM

## 2019-07-27 ENCOUNTER — Encounter (HOSPITAL_COMMUNITY): Payer: PPO

## 2019-07-29 ENCOUNTER — Other Ambulatory Visit: Payer: Self-pay

## 2019-07-29 ENCOUNTER — Encounter (HOSPITAL_COMMUNITY): Payer: PPO

## 2019-07-29 ENCOUNTER — Encounter (HOSPITAL_COMMUNITY)
Admission: RE | Admit: 2019-07-29 | Discharge: 2019-07-29 | Disposition: A | Discharge status: Home / Self Care | Payer: PPO | Source: Ambulatory Visit | Attending: Cardiology | Admitting: Cardiology

## 2019-07-29 ENCOUNTER — Ambulatory Visit (HOSPITAL_COMMUNITY)
Admission: RE | Admit: 2019-07-29 | Discharge: 2019-07-29 | Disposition: A | Discharge status: Home / Self Care | Payer: PPO | Source: Ambulatory Visit | Attending: Cardiology | Admitting: Cardiology

## 2019-07-29 ENCOUNTER — Encounter (HOSPITAL_BASED_OUTPATIENT_CLINIC_OR_DEPARTMENT_OTHER)
Admission: RE | Admit: 2019-07-29 | Discharge: 2019-07-29 | Disposition: A | Discharge status: Home / Self Care | Payer: PPO | Source: Ambulatory Visit | Attending: Cardiology | Admitting: Cardiology

## 2019-07-29 DIAGNOSIS — I2510 Atherosclerotic heart disease of native coronary artery without angina pectoris: Secondary | ICD-10-CM | POA: Insufficient documentation

## 2019-07-29 DIAGNOSIS — I5022 Chronic systolic (congestive) heart failure: Secondary | ICD-10-CM | POA: Diagnosis not present

## 2019-07-29 DIAGNOSIS — I509 Heart failure, unspecified: Secondary | ICD-10-CM | POA: Diagnosis not present

## 2019-07-29 DIAGNOSIS — Z951 Presence of aortocoronary bypass graft: Secondary | ICD-10-CM | POA: Insufficient documentation

## 2019-07-29 DIAGNOSIS — E119 Type 2 diabetes mellitus without complications: Secondary | ICD-10-CM | POA: Insufficient documentation

## 2019-07-29 DIAGNOSIS — Z79899 Other long term (current) drug therapy: Secondary | ICD-10-CM | POA: Insufficient documentation

## 2019-07-29 DIAGNOSIS — I255 Ischemic cardiomyopathy: Secondary | ICD-10-CM | POA: Diagnosis not present

## 2019-07-29 LAB — BASIC METABOLIC PANEL
Anion gap: 15 (ref 5–15)
BUN: 94 mg/dL — ABNORMAL HIGH (ref 8–23)
CO2: 29 mmol/L (ref 22–32)
Calcium: 9.5 mg/dL (ref 8.9–10.3)
Chloride: 93 mmol/L — ABNORMAL LOW (ref 98–111)
Creatinine, Ser: 3.75 mg/dL — ABNORMAL HIGH (ref 0.61–1.24)
GFR calc Af Amer: 16 mL/min — ABNORMAL LOW (ref >60)
GFR calc non Af Amer: 14 mL/min — ABNORMAL LOW (ref >60)
Glucose, Bld: 202 mg/dL — ABNORMAL HIGH (ref 70–99)
Potassium: 4.3 mmol/L (ref 3.5–5.1)
Sodium: 137 mmol/L (ref 135–145)

## 2019-07-29 MED ORDER — TECHNETIUM TC 99M PYROPHOSPHATE
22.7000 | Freq: Once | INTRAVENOUS | Status: AC | PRN
  Administered 2019-07-29: 22.7 via INTRAVENOUS
  Filled 2019-07-29: qty 23

## 2019-07-30 ENCOUNTER — Other Ambulatory Visit (HOSPITAL_COMMUNITY): Payer: PPO

## 2019-07-30 ENCOUNTER — Encounter (HOSPITAL_COMMUNITY): Payer: Self-pay | Admitting: *Deleted

## 2019-07-30 ENCOUNTER — Telehealth (HOSPITAL_COMMUNITY): Payer: Self-pay | Admitting: *Deleted

## 2019-07-30 DIAGNOSIS — I5022 Chronic systolic (congestive) heart failure: Secondary | ICD-10-CM

## 2019-07-30 NOTE — Telephone Encounter (Signed)
[name] said that he had lab work completed and was not able to attend. [name] plans to return to exercise tomorrow.Barnet Pall, RN,BSN 07/30/2019 12:49 PM

## 2019-07-30 NOTE — Progress Notes (Signed)
Cardiac Individual Treatment Plan  Patient Details  Name: [name]. MRN: [id number] Date of Birth: [birth year] Referring Provider:     CARDIAC REHAB PHASE II ORIENTATION from 06/30/2019 in [place]  Referring Provider  Larey Dresser, MD      Initial Encounter Date:    CARDIAC REHAB PHASE II ORIENTATION from 06/30/2019 in [place]  Date  06/30/19      Visit Diagnosis: Chronic systolic congestive heart failure ([place])  Patient's Home Medications on Admission:  Current Outpatient Medications:  .  amiodarone (PACERONE) 200 MG tablet, Take 1 tablet (200 mg total) by mouth daily., Disp: 30 tablet, Rfl: 4 .  apixaban (ELIQUIS) 2.5 MG TABS tablet, Take 1 tablet (2.5 mg total) by mouth 2 (two) times daily., Disp: 180 tablet, Rfl: 1 .  calcium-vitamin D (OSCAL WITH D) 500-200 MG-UNIT per tablet, Take 1 tablet by mouth daily. , Disp: , Rfl:  .  Cholecalciferol (VITAMIN D3) 2000 UNITS TABS, Take 2,000 Units by mouth daily. 50 mcg, Disp: , Rfl:  .  Coenzyme Q10 (CO Q 10) 100 MG CAPS, Take 300 mg by mouth daily. , Disp: , Rfl:  .  fish oil-omega-3 fatty acids 1000 MG capsule, Take 1 g by mouth daily.  , Disp: , Rfl:  .  gabapentin (NEURONTIN) 300 MG capsule, Take 300 mg by mouth 2 (two) times daily as needed (Nerve pain). , Disp: , Rfl:  .  glipiZIDE (GLUCOTROL) 10 MG tablet, Take 10 mg by mouth 2 (two) times daily before a meal., Disp: , Rfl:  .  metFORMIN (GLUCOPHAGE) 1000 MG tablet, Take 1,000 mg by mouth 2 (two) times daily., Disp: , Rfl:  .  metoprolol succinate (TOPROL-XL) 25 MG 24 hr tablet, Take 0.5 tablets (12.5 mg total) by mouth daily., Disp: 90 tablet, Rfl: 3 .  Multiple Vitamin (MULTI-DAY VITAMINS PO), Take 1 tablet by mouth daily. , Disp: , Rfl:  .  omeprazole (PRILOSEC) 20 MG capsule, Take 20 mg by mouth 2 (two) times daily before a meal. , Disp: , Rfl:  .  simvastatin (ZOCOR) 20 MG tablet, Take 20 mg by mouth  at bedtime. , Disp: , Rfl:  .  torsemide (DEMADEX) 20 MG tablet, Take 3 tablets (60 mg total) by mouth every morning AND 2 tablets (40 mg total) every evening., Disp: 155 tablet, Rfl: 6 .  Turmeric 450 MG CAPS, Take 450 mg by mouth daily. , Disp: , Rfl:  .  vitamin C (ASCORBIC ACID) 250 MG tablet, Take 250 mg by mouth daily. , Disp: , Rfl:  .  vitamin E 1000 UNIT capsule, Take 1,000 Units by mouth every other day., Disp: , Rfl:   Past Medical History: Past Medical History:  Diagnosis Date  . ANEMIA, HX OF   . Bronchial pneumonia Jan. 2016  . CAD, ARTERY BYPASS GRAFT   . CAROTID ARTERY STENOSIS   . Cataract   . DIABETES MELLITUS, TYPE II   . Diabetic retinal damage of right eye ([place]) 2015  . Diverticulosis   . HYPERCHOLESTEROLEMIA   . HYPERTENSION   . Myocardial infarction [facility]) June 2007  . New onset atrial fibrillation ([place]) 06/24/2014  . PULMONARY HYPERTENSION   . RENAL INSUFFICIENCY, CHRONIC   . UNSPECIFIED THROMBOCYTOPENIA     Tobacco Use: Social History   Tobacco Use  Smoking Status Former Smoker  . Types: Cigarettes  . Quit date: 03/26/1970  . Years since quitting: 27.3  Smokeless Tobacco Never Used    Labs: Recent Review Flowsheet Data    Labs for ITP Cardiac and Pulmonary Rehab Latest Ref Rng & Units 05/22/2019 05/22/2019 05/23/2019   Hemoglobin A1c 4.8 - 5.6 % - - 7.6(H)   HCO3 20.0 - 28.0 mmol/L 28.0 27.7 -   TCO2 22 - 32 mmol/L 29 29 -   O2SAT % 55.0 58.0 -      Capillary Blood Glucose: Lab Results  Component Value Date   GLUCAP 85 07/24/2019   GLUCAP 177 (H) 07/22/2019   GLUCAP 149 (H) 07/20/2019   GLUCAP 111 (H) 07/08/2019   GLUCAP 149 (H) 07/08/2019   POCT Glucose    Row Name 07/06/19 1227             POCT Blood Glucose   Pre-Exercise  173 mg/dL       Post-Exercise  147 mg/dL          Exercise Target Goals: Exercise Program Goal: Individual exercise prescription set using results from initial 6 min walk test and THRR while considering   patient's activity barriers and safety.   Exercise Prescription Goal: Starting with aerobic activity 30 plus minutes a day, 3 days per week for initial exercise prescription. Provide home exercise prescription and guidelines that participant acknowledges understanding prior to discharge.  Activity Barriers & Risk Stratification: Activity Barriers & Cardiac Risk Stratification - 06/30/19 1034      Activity Barriers & Cardiac Risk Stratification   Activity Barriers  Shortness of Breath;Balance Concerns    Cardiac Risk Stratification  High       6 Minute Walk: 6 Minute Walk    Row Name 06/30/19 1044         6 Minute Walk   Phase  Initial     Distance  854 feet     Walk Time  6 minutes     # of Rest Breaks  0     MPH  1.62     METS  1.58     RPE  11     Perceived Dyspnea   0     VO2 Peak  5.54     Symptoms  No     Resting HR  48 bpm     Resting BP  96/50     Resting Oxygen Saturation   100 %     Exercise Oxygen Saturation  during 6 min walk  100 %     Max Ex. HR  69 bpm     Max Ex. BP  127/59     2 Minute Post BP  136/56        Oxygen Initial Assessment:   Oxygen Re-Evaluation:   Oxygen Discharge (Final Oxygen Re-Evaluation):   Initial Exercise Prescription: Initial Exercise Prescription - 06/30/19 1200      Date of Initial Exercise RX and Referring Provider   Date  06/30/19    Referring Provider  Larey Dresser, MD    Expected Discharge Date  08/28/19      Recumbant Bike   Level  2    Watts  10    Minutes  15    METs  2.45      Arm Ergometer   Level  1    Watts  10    Minutes  15    METs  1.75      Prescription Details   Frequency (times per week)  3    Duration  Progress to 30 minutes  of continuous aerobic without signs/symptoms of physical distress      Intensity   THRR 40-80% of Max Heartrate  56-111    Ratings of Perceived Exertion  11-13    Perceived Dyspnea  0-4      Progression   Progression  Continue to progress workloads to  maintain intensity without signs/symptoms of physical distress.      Resistance Training   Training Prescription  Yes    Weight  3lbs.    Reps  10-15       Perform Capillary Blood Glucose checks as needed.  Exercise Prescription Changes:  Exercise Prescription Changes    Row Name 07/06/19 1200 07/20/19 1413           Response to Exercise   Blood Pressure (Admit)  102/58  116/68      Blood Pressure (Exercise)  114/70  104/72      Blood Pressure (Exit)  118/70  118/60      Heart Rate (Admit)  53 bpm  50 bpm      Heart Rate (Exercise)  66 bpm  55 bpm      Heart Rate (Exit)  49 bpm  56 bpm      Rating of Perceived Exertion (Exercise)  13  13      Duration  Continue with 30 min of aerobic exercise without signs/symptoms of physical distress.  Continue with 30 min of aerobic exercise without signs/symptoms of physical distress.      Intensity  THRR unchanged  THRR unchanged        Progression   Progression  Continue to progress workloads to maintain intensity without signs/symptoms of physical distress.  Continue to progress workloads to maintain intensity without signs/symptoms of physical distress.      Average METs  --  1.7        Resistance Training   Training Prescription  Yes  Yes      Weight  3 lbs  3 lbs      Reps  10-15  10-15      Time  --  10 Minutes        Recumbant Bike   Level  2  2      Watts  --  75      Minutes  15  30      METs  2.6  1.7        Arm Ergometer   Level  1  --      Minutes  15  --         Exercise Comments:  Exercise Comments    Row Name 07/06/19 1224 07/30/19 1338         Exercise Comments  Pt completed his first day of exercise in cardiac rehab. Pt tolerated exercise well with no complaints.  Pt has been tolerating exercise fairly okay. Pt has been feeling weak. Will follow up with pt to see if he feels like exercising at home is feasible. Will recommend chair exercises for home exercise.         Exercise Goals and  Review:  Exercise Goals    Row Name 06/30/19 1035             Exercise Goals   Increase Physical Activity  Yes       Intervention  Provide advice, education, support and counseling about physical activity/exercise needs.;Develop an individualized exercise prescription for aerobic and resistive training based on initial evaluation findings, risk stratification, comorbidities and participant's  personal goals.       Expected Outcomes  Short Term: Attend rehab on a regular basis to increase amount of physical activity.;Long Term: Exercising regularly at least 3-5 days a week.;Long Term: Add in home exercise to make exercise part of routine and to increase amount of physical activity.       Increase Strength and Stamina  Yes       Intervention  Provide advice, education, support and counseling about physical activity/exercise needs.;Develop an individualized exercise prescription for aerobic and resistive training based on initial evaluation findings, risk stratification, comorbidities and participant's personal goals.       Expected Outcomes  Short Term: Increase workloads from initial exercise prescription for resistance, speed, and METs.;Short Term: Perform resistance training exercises routinely during rehab and add in resistance training at home;Long Term: Improve cardiorespiratory fitness, muscular endurance and strength as measured by increased METs and functional capacity (6MWT)       Able to understand and use rate of perceived exertion (RPE) scale  Yes       Intervention  Provide education and explanation on how to use RPE scale       Expected Outcomes  Short Term: Able to use RPE daily in rehab to express subjective intensity level;Long Term:  Able to use RPE to guide intensity level when exercising independently       Knowledge and understanding of Target Heart Rate Range (THRR)  Yes       Intervention  Provide education and explanation of THRR including how the numbers were predicted and  where they are located for reference       Expected Outcomes  Short Term: Able to state/look up THRR;Long Term: Able to use THRR to govern intensity when exercising independently;Short Term: Able to use daily as guideline for intensity in rehab       Able to check pulse independently  Yes       Intervention  Provide education and demonstration on how to check pulse in carotid and radial arteries.;Review the importance of being able to check your own pulse for safety during independent exercise       Expected Outcomes  Short Term: Able to explain why pulse checking is important during independent exercise;Long Term: Able to check pulse independently and accurately       Understanding of Exercise Prescription  Yes       Intervention  Provide education, explanation, and written materials on patient's individual exercise prescription       Expected Outcomes  Short Term: Able to explain program exercise prescription;Long Term: Able to explain home exercise prescription to exercise independently          Exercise Goals Re-Evaluation :    Discharge Exercise Prescription (Final Exercise Prescription Changes): Exercise Prescription Changes - 07/20/19 1413      Response to Exercise   Blood Pressure (Admit)  116/68    Blood Pressure (Exercise)  104/72    Blood Pressure (Exit)  118/60    Heart Rate (Admit)  50 bpm    Heart Rate (Exercise)  55 bpm    Heart Rate (Exit)  56 bpm    Rating of Perceived Exertion (Exercise)  13    Duration  Continue with 30 min of aerobic exercise without signs/symptoms of physical distress.    Intensity  THRR unchanged      Progression   Progression  Continue to progress workloads to maintain intensity without signs/symptoms of physical distress.    Average METs  1.7      Resistance Training   Training Prescription  Yes    Weight  3 lbs    Reps  10-15    Time  10 Minutes      Recumbant Bike   Level  2    Watts  75    Minutes  30    METs  1.7        Nutrition:  Target Goals: Understanding of nutrition guidelines, daily intake of sodium 1500mg , cholesterol 200mg , calories 30% from fat and 7% or less from saturated fats, daily to have 5 or more servings of fruits and vegetables.  Biometrics: Pre Biometrics - 06/30/19 1015      Pre Biometrics   Waist Circumference  34 inches    Hip Circumference  38.5 inches    Waist to Hip Ratio  0.88 %    Triceps Skinfold  9 mm    % Body Fat  21 %    Grip Strength  37 kg    Flexibility  0 in    Single Leg Stand  0 seconds        Nutrition Therapy Plan and Nutrition Goals:   Nutrition Assessments: Nutrition Assessments - 07/16/19 1121      MEDFICTS Scores   Pre Score  27       Nutrition Goals Re-Evaluation:   Nutrition Goals Discharge (Final Nutrition Goals Re-Evaluation):   Psychosocial: Target Goals: Acknowledge presence or absence of significant depression and/or stress, maximize coping skills, provide positive support system. Participant is able to verbalize types and ability to use techniques and skills needed for reducing stress and depression.  Initial Review & Psychosocial Screening: Initial Psych Review & Screening - 06/30/19 1129      Initial Review   Current issues with  Current Stress Concerns    Source of Stress Concerns  Family;Chronic Illness;Unable to perform yard/household activities    Comments  Mr. Cretella states he has a disabled son that is bed/chair bound, that lives in the home. Mr. Simson states his wife is not healthy herself and unable to assist with sons care. Son has CAPS however Mr. Russman states he was up last night with son because CAPS worker did not show up. "we have some good caregivers and some bad ones". "Thank goodness I am not a depressed person". Mr. Haberman states he does have family and friends that will provide meals for the family as well as emotional support. He denies need for intervention at this time.      Family Dynamics   Good  Support System?  Yes    Concerns  Inappropriate over/under dependence on family/friends    Comments  under dependence related to sons care      Barriers   Psychosocial barriers to participate in program  The patient should benefit from training in stress management and relaxation.      Screening Interventions   Interventions  Encouraged to exercise;Provide feedback about the scores to participant;To provide support and resources with identified psychosocial needs    Expected Outcomes  Short Term goal: Utilizing psychosocial counselor, staff and physician to assist with identification of specific Stressors or current issues interfering with healing process. Setting desired goal for each stressor or current issue identified.;Long Term Goal: Stressors or current issues are controlled or eliminated.;Short Term goal: Identification and review with participant of any Quality of Life or Depression concerns found by scoring the questionnaire.;Long Term goal: The participant improves quality of Life and PHQ9 Scores  as seen by post scores and/or verbalization of changes       Quality of Life Scores: Quality of Life - 07/09/19 1626      Quality of Life   Select  Quality of Life      Quality of Life Scores   Health/Function Pre  22.47 %    Socioeconomic Pre  23.58 %    Psych/Spiritual Pre  23.5 %    Family Pre  19.2 %    GLOBAL Pre  22.39 %      Scores of 19 and below usually indicate a poorer quality of life in these areas.  A difference of  2-3 points is a clinically meaningful difference.  A difference of 2-3 points in the total score of the Quality of Life Index has been associated with significant improvement in overall quality of life, self-image, physical symptoms, and general health in studies assessing change in quality of life.  PHQ-9: Recent Review Flowsheet Data    Depression screen [facility] 2/9 06/30/2019   Decreased Interest 0   Down, Depressed, Hopeless 0   PHQ - 2 Score 0      Interpretation of Total Score  Total Score Depression Severity:  1-4 = Minimal depression, 5-9 = Mild depression, 10-14 = Moderate depression, 15-19 = Moderately severe depression, 20-27 = Severe depression   Psychosocial Evaluation and Intervention:   Psychosocial Re-Evaluation: Psychosocial Re-Evaluation    Row Name 07/06/19 1650 07/30/19 1433 07/30/19 1439         Psychosocial Re-Evaluation   Current issues with  Current Stress Concerns  Current Stress Concerns  --     Comments  --  [name] continues to have stress concerns regarding his own health and his family, wife and son  --     Expected Outcomes  --  Will continue to offer emotional support as needed  --     Interventions  Encouraged to attend Cardiac Rehabilitation for the exercise  Encouraged to attend Cardiac Rehabilitation for the exercise  --     Continue Psychosocial Services   --  Follow up required by staff  --     Comments  Mr. Hemmelgarn states he has a disabled son that is bed/chair bound, that lives in the home. Mr. Boehle states his wife is not healthy herself and unable to assist with sons care. Son has CAPS however Mr. Staller states he was up last night with son because CAPS worker did not show up. "we have some good caregivers and some bad ones". "Thank goodness I am not a depressed person". Mr. Swamy states he does have family and friends that will provide meals for the family as well as emotional support. He denies need for intervention at this time.  [name] continues to deny the need for intervention at this time.  [name] says the he is in the process of moving to [facility] in the fall.  [name] continues to deny the need for intervention at this time.  [name] says the he is in the process of moving to [facility] in the fall.       Initial Review   Source of Stress Concerns  Family;Chronic Illness;Unable to perform yard/household activities  Family;Chronic Illness;Unable to perform yard/household activities  --         Psychosocial Discharge (Final Psychosocial Re-Evaluation): Psychosocial Re-Evaluation - 07/30/19 1439      Psychosocial Re-Evaluation   Comments  [name] continues to deny the need for intervention at this time.  [name] says the he is in the process of moving to [facility] in the fall.       Vocational Rehabilitation: Provide vocational rehab assistance to qualifying candidates.   Vocational Rehab Evaluation & Intervention:   Education: Education Goals: Education classes will be provided on a weekly basis, covering required topics. Participant will state understanding/return demonstration of topics presented.  Learning Barriers/Preferences:   Education Topics: Hypertension, Hypertension Reduction -Define heart disease and high blood pressure. Discus how high blood pressure affects the body and ways to reduce high blood pressure.   Exercise and Your Heart -Discuss why it is important to exercise, the FITT principles of exercise, normal and abnormal responses to exercise, and how to exercise safely.   Angina -Discuss definition of angina, causes of angina, treatment of angina, and how to decrease risk of having angina.   Cardiac Medications -Review what the following cardiac medications are used for, how they affect the body, and side effects that may occur when taking the medications.  Medications include Aspirin, Beta blockers, calcium channel blockers, ACE Inhibitors, angiotensin receptor blockers, diuretics, digoxin, and antihyperlipidemics.   Congestive Heart Failure -Discuss the definition of CHF, how to live with CHF, the signs and symptoms of CHF, and how keep track of weight and sodium intake.   Heart Disease and Intimacy -Discus the effect sexual activity has on the heart, how changes occur during intimacy as we age, and safety during sexual activity.   Smoking Cessation / COPD -Discuss different methods to quit smoking, the health benefits of quitting smoking,  and the definition of COPD.   Nutrition I: Fats -Discuss the types of cholesterol, what cholesterol does to the heart, and how cholesterol levels can be controlled.   Nutrition II: Labels -Discuss the different components of food labels and how to read food label   Heart Parts/Heart Disease and PAD -Discuss the anatomy of the heart, the pathway of blood circulation through the heart, and these are affected by heart disease.   Stress I: Signs and Symptoms -Discuss the causes of stress, how stress may lead to anxiety and depression, and ways to limit stress.   Stress II: Relaxation -Discuss different types of relaxation techniques to limit stress.   Warning Signs of Stroke / TIA -Discuss definition of a stroke, what the signs and symptoms are of a stroke, and how to identify when someone is having stroke.   Knowledge Questionnaire Score: Knowledge Questionnaire Score - 07/06/19 1649      Knowledge Questionnaire Score   Pre Score  20/24       Core Components/Risk Factors/Patient Goals at Admission: Personal Goals and Risk Factors at Admission - 07/06/19 1650      Core Components/Risk Factors/Patient Goals on Admission    Weight Management  Yes;Weight Maintenance    Intervention  Weight Management: Develop a combined nutrition and exercise program designed to reach desired caloric intake, while maintaining appropriate intake of nutrient and fiber, sodium and fats, and appropriate energy expenditure required for the weight goal.;Weight Management: Provide education and appropriate resources to help participant work on and attain dietary goals.    Diabetes  Yes    Intervention  Provide education about signs/symptoms and action to take for hypo/hyperglycemia.;Provide education about proper nutrition, including hydration, and aerobic/resistive exercise prescription along with prescribed medications to achieve blood glucose in normal ranges: Fasting glucose 65-99 mg/dL    Expected  Outcomes  Short Term: Participant verbalizes understanding of the signs/symptoms and immediate care of hyper/hypoglycemia, proper  foot care and importance of medication, aerobic/resistive exercise and nutrition plan for blood glucose control.;Long Term: Attainment of HbA1C < 7%.    Heart Failure  Yes    Intervention  Provide a combined exercise and nutrition program that is supplemented with education, support and counseling about heart failure. Directed toward relieving symptoms such as shortness of breath, decreased exercise tolerance, and extremity edema.    Expected Outcomes  Improve functional capacity of life;Short term: Attendance in program 2-3 days a week with increased exercise capacity. Reported lower sodium intake. Reported increased fruit and vegetable intake. Reports medication compliance.;Short term: Daily weights obtained and reported for increase. Utilizing diuretic protocols set by physician.;Long term: Adoption of self-care skills and reduction of barriers for early signs and symptoms recognition and intervention leading to self-care maintenance.    Hypertension  Yes    Intervention  Provide education on lifestyle modifcations including regular physical activity/exercise, weight management, moderate sodium restriction and increased consumption of fresh fruit, vegetables, and low fat dairy, alcohol moderation, and smoking cessation.;Monitor prescription use compliance.    Expected Outcomes  Short Term: Continued assessment and intervention until BP is < 140/30mm HG in hypertensive participants. < 130/23mm HG in hypertensive participants with diabetes, heart failure or chronic kidney disease.;Long Term: Maintenance of blood pressure at goal levels.    Lipids  Yes    Intervention  Provide education and support for participant on nutrition & aerobic/resistive exercise along with prescribed medications to achieve LDL 70mg , HDL >40mg .    Expected Outcomes  Short Term: Participant states  understanding of desired cholesterol values and is compliant with medications prescribed. Participant is following exercise prescription and nutrition guidelines.;Long Term: Cholesterol controlled with medications as prescribed, with individualized exercise RX and with personalized nutrition plan. Value goals: LDL < 70mg , HDL > 40 mg.    Personal Goal Other  Yes    Personal Goal  Improve balance.    Intervention  Provide exercise, stretching, and resistance training guidelines to help improve strength and functional capacity to help with balance.       Core Components/Risk Factors/Patient Goals Review:  Goals and Risk Factor Review    Row Name 07/06/19 1652 07/30/19 1436           Core Components/Risk Factors/Patient Goals Review   Personal Goals Review  Weight Management/Obesity;Heart Failure;Diabetes;Stress  Weight Management/Obesity;Heart Failure;Diabetes;Stress      Review  [name] completed light exerccise today at cardiac rehab without difficulty  [name] has had some issures with brady cardia and low CBG on last session. [name] enjoyes exercise when he attends. Patient remains deconditioned but gives good effort with exercise      Expected Outcomes  [name] will continue to participate in phase 2 cardiac rehab for exercise, nutrition and lifestyle modifications  [name] will continue to participate in phase 2 cardiac rehab for exercise, nutrition and lifestyle modifications         Core Components/Risk Factors/Patient Goals at Discharge (Final Review):  Goals and Risk Factor Review - 07/30/19 1436      Core Components/Risk Factors/Patient Goals Review   Personal Goals Review  Weight Management/Obesity;Heart Failure;Diabetes;Stress    Review  [name] has had some issures with brady cardia and low CBG on last session. [name] enjoyes exercise when he attends. Patient remains deconditioned but gives good effort with exercise    Expected Outcomes  [name] will continue to participate in phase 2 cardiac rehab for  exercise, nutrition and lifestyle modifications       ITP  Comments: ITP Comments    Row Name 06/30/19 1012 07/30/19 1430         ITP Comments  Dr. Fransico Him Medical Director [facility]  30 Day ITP Review. [name] has had good participation in exercise when he has been able to attend. [name] reports feeling better since his metropolol dose has been decreased.         Comments: See ITP comments.Barnet Pall, RN,BSN 07/30/2019 2:42 PM

## 2019-07-31 ENCOUNTER — Encounter (HOSPITAL_COMMUNITY): Payer: PPO

## 2019-07-31 ENCOUNTER — Encounter (HOSPITAL_COMMUNITY)
Admission: RE | Admit: 2019-07-31 | Discharge: 2019-07-31 | Disposition: A | Discharge status: Home / Self Care | Payer: PPO | Source: Ambulatory Visit | Attending: Cardiology | Admitting: Cardiology

## 2019-07-31 ENCOUNTER — Other Ambulatory Visit: Payer: Self-pay

## 2019-07-31 DIAGNOSIS — Z7901 Long term (current) use of anticoagulants: Secondary | ICD-10-CM | POA: Insufficient documentation

## 2019-07-31 DIAGNOSIS — Z79899 Other long term (current) drug therapy: Secondary | ICD-10-CM | POA: Insufficient documentation

## 2019-07-31 DIAGNOSIS — I130 Hypertensive heart and chronic kidney disease with heart failure and stage 1 through stage 4 chronic kidney disease, or unspecified chronic kidney disease: Secondary | ICD-10-CM | POA: Insufficient documentation

## 2019-07-31 DIAGNOSIS — I2510 Atherosclerotic heart disease of native coronary artery without angina pectoris: Secondary | ICD-10-CM | POA: Diagnosis not present

## 2019-07-31 DIAGNOSIS — I5022 Chronic systolic (congestive) heart failure: Secondary | ICD-10-CM | POA: Insufficient documentation

## 2019-07-31 DIAGNOSIS — E1122 Type 2 diabetes mellitus with diabetic chronic kidney disease: Secondary | ICD-10-CM | POA: Diagnosis not present

## 2019-07-31 DIAGNOSIS — N184 Chronic kidney disease, stage 4 (severe): Secondary | ICD-10-CM | POA: Insufficient documentation

## 2019-07-31 DIAGNOSIS — Z7984 Long term (current) use of oral hypoglycemic drugs: Secondary | ICD-10-CM | POA: Diagnosis not present

## 2019-07-31 DIAGNOSIS — Z951 Presence of aortocoronary bypass graft: Secondary | ICD-10-CM | POA: Insufficient documentation

## 2019-07-31 LAB — GLUCOSE, CAPILLARY: Glucose-Capillary: 165 mg/dL — ABNORMAL HIGH (ref 70–99)

## 2019-08-03 ENCOUNTER — Encounter (HOSPITAL_COMMUNITY): Payer: PPO

## 2019-08-03 ENCOUNTER — Telehealth (HOSPITAL_COMMUNITY): Payer: Self-pay | Admitting: *Deleted

## 2019-08-03 ENCOUNTER — Ambulatory Visit (HOSPITAL_COMMUNITY): Payer: PPO

## 2019-08-03 NOTE — Telephone Encounter (Signed)
Spoke with [name] he checked his blood sugar at home it was 88. [name] said that he ate breakfast and feels fine. [name] will not exercise today per protocol for CBG less than 90. [name] plans to return to exercise on Wednesday.Barnet Pall, RN,BSN 08/03/2019 10:30 AM

## 2019-08-05 ENCOUNTER — Encounter (HOSPITAL_COMMUNITY)
Admission: RE | Admit: 2019-08-05 | Discharge: 2019-08-05 | Disposition: A | Discharge status: Home / Self Care | Payer: PPO | Source: Ambulatory Visit | Attending: Cardiology | Admitting: Cardiology

## 2019-08-05 ENCOUNTER — Encounter (HOSPITAL_COMMUNITY): Payer: PPO

## 2019-08-05 ENCOUNTER — Other Ambulatory Visit: Payer: Self-pay

## 2019-08-05 DIAGNOSIS — I5022 Chronic systolic (congestive) heart failure: Secondary | ICD-10-CM

## 2019-08-05 DIAGNOSIS — I130 Hypertensive heart and chronic kidney disease with heart failure and stage 1 through stage 4 chronic kidney disease, or unspecified chronic kidney disease: Secondary | ICD-10-CM | POA: Diagnosis not present

## 2019-08-07 ENCOUNTER — Encounter (HOSPITAL_COMMUNITY)
Admission: RE | Admit: 2019-08-07 | Discharge: 2019-08-07 | Disposition: A | Discharge status: Home / Self Care | Payer: PPO | Source: Ambulatory Visit | Attending: Cardiology | Admitting: Cardiology

## 2019-08-07 ENCOUNTER — Other Ambulatory Visit: Payer: Self-pay

## 2019-08-07 ENCOUNTER — Encounter (HOSPITAL_COMMUNITY): Payer: PPO

## 2019-08-07 DIAGNOSIS — I130 Hypertensive heart and chronic kidney disease with heart failure and stage 1 through stage 4 chronic kidney disease, or unspecified chronic kidney disease: Secondary | ICD-10-CM | POA: Diagnosis not present

## 2019-08-07 DIAGNOSIS — I5022 Chronic systolic (congestive) heart failure: Secondary | ICD-10-CM

## 2019-08-07 NOTE — Progress Notes (Addendum)
Discharge Progress Report  Patient Details  Name: [name]. MRN: [id number] Date of Birth: 1936/03/06 Referring Provider:     CARDIAC REHAB PHASE II ORIENTATION from 06/30/2019 in [facility]  Referring Provider  Larey Dresser, MD       Number of Visits: 8  Reason for Discharge:  Early Exit:  [name] is in the process of downsizing and moving his son to a facility  Smoking History:  Social History   Tobacco Use  Smoking Status Former Smoker  . Types: Cigarettes  . Quit date: 03/26/1970  . Years since quitting: 49.4  Smokeless Tobacco Never Used    Diagnosis:  Chronic systolic congestive heart failure ([place])  ADL UCSD:   Initial Exercise Prescription: Initial Exercise Prescription - 06/30/19 1200      Date of Initial Exercise RX and Referring Provider   Date  06/30/19    Referring Provider  Larey Dresser, MD    Expected Discharge Date  08/28/19      Recumbant Bike   Level  2    Watts  10    Minutes  15    METs  2.45      Arm Ergometer   Level  1    Watts  10    Minutes  15    METs  1.75      Prescription Details   Frequency (times per week)  3    Duration  Progress to 30 minutes of continuous aerobic without signs/symptoms of physical distress      Intensity   THRR 40-80% of Max Heartrate  56-111    Ratings of Perceived Exertion  11-13    Perceived Dyspnea  0-4      Progression   Progression  Continue to progress workloads to maintain intensity without signs/symptoms of physical distress.      Resistance Training   Training Prescription  Yes    Weight  3lbs.    Reps  10-15       Discharge Exercise Prescription (Final Exercise Prescription Changes): Exercise Prescription Changes - 08/07/19 0752      Response to Exercise   Blood Pressure (Admit)  112/45    Blood Pressure (Exercise)  118/78    Blood Pressure (Exit)  118/58    Heart Rate (Admit)  67 bpm    Heart Rate (Exercise)  102 bpm    Heart Rate  (Exit)  57 bpm    Rating of Perceived Exertion (Exercise)  13    Perceived Dyspnea (Exercise)  0    Symptoms  None    Comments  Pt's last day of exercise     Duration  Continue with 30 min of aerobic exercise without signs/symptoms of physical distress.    Intensity  THRR unchanged      Progression   Progression  Continue to progress workloads to maintain intensity without signs/symptoms of physical distress.    Average METs  2      Resistance Training   Training Prescription  Yes    Weight  3 lbs    Reps  10-15    Time  10 Minutes      Recumbant Bike   Level  2    Watts  75    Minutes  30    METs  2       Functional Capacity: 6 Minute Walk    Row Name 06/30/19 1044  6 Minute Walk   Phase  Initial     Distance  854 feet     Walk Time  6 minutes     # of Rest Breaks  0     MPH  1.62     METS  1.58     RPE  11     Perceived Dyspnea   0     VO2 Peak  5.54     Symptoms  No     Resting HR  48 bpm     Resting BP  96/50     Resting Oxygen Saturation   100 %     Exercise Oxygen Saturation  during 6 min walk  100 %     Max Ex. HR  69 bpm     Max Ex. BP  127/59     2 Minute Post BP  136/56        Psychological, QOL, Others - Outcomes: PHQ 2/9: Depression screen [facility] 2/9 08/07/2019 06/30/2019  Decreased Interest 0 0  Down, Depressed, Hopeless 0 0  PHQ - 2 Score 0 0  Some recent data might be hidden    Quality of Life: Quality of Life - 07/09/19 1626      Quality of Life   Select  Quality of Life      Quality of Life Scores   Health/Function Pre  22.47 %    Socioeconomic Pre  23.58 %    Psych/Spiritual Pre  23.5 %    Family Pre  19.2 %    GLOBAL Pre  22.39 %       Personal Goals: Goals established at orientation with interventions provided to work toward goal. Personal Goals and Risk Factors at Admission - 07/06/19 1650      Core Components/Risk Factors/Patient Goals on Admission    Weight Management  Yes;Weight Maintenance    Intervention   Weight Management: Develop a combined nutrition and exercise program designed to reach desired caloric intake, while maintaining appropriate intake of nutrient and fiber, sodium and fats, and appropriate energy expenditure required for the weight goal.;Weight Management: Provide education and appropriate resources to help participant work on and attain dietary goals.    Diabetes  Yes    Intervention  Provide education about signs/symptoms and action to take for hypo/hyperglycemia.;Provide education about proper nutrition, including hydration, and aerobic/resistive exercise prescription along with prescribed medications to achieve blood glucose in normal ranges: Fasting glucose 65-99 mg/dL    Expected Outcomes  Short Term: Participant verbalizes understanding of the signs/symptoms and immediate care of hyper/hypoglycemia, proper foot care and importance of medication, aerobic/resistive exercise and nutrition plan for blood glucose control.;Long Term: Attainment of HbA1C < 7%.    Heart Failure  Yes    Intervention  Provide a combined exercise and nutrition program that is supplemented with education, support and counseling about heart failure. Directed toward relieving symptoms such as shortness of breath, decreased exercise tolerance, and extremity edema.    Expected Outcomes  Improve functional capacity of life;Short term: Attendance in program 2-3 days a week with increased exercise capacity. Reported lower sodium intake. Reported increased fruit and vegetable intake. Reports medication compliance.;Short term: Daily weights obtained and reported for increase. Utilizing diuretic protocols set by physician.;Long term: Adoption of self-care skills and reduction of barriers for early signs and symptoms recognition and intervention leading to self-care maintenance.    Hypertension  Yes    Intervention  Provide education on lifestyle modifcations including regular physical activity/exercise, weight  management,  moderate sodium restriction and increased consumption of fresh fruit, vegetables, and low fat dairy, alcohol moderation, and smoking cessation.;Monitor prescription use compliance.    Expected Outcomes  Short Term: Continued assessment and intervention until BP is < 140/46mm HG in hypertensive participants. < 130/34mm HG in hypertensive participants with diabetes, heart failure or chronic kidney disease.;Long Term: Maintenance of blood pressure at goal levels.    Lipids  Yes    Intervention  Provide education and support for participant on nutrition & aerobic/resistive exercise along with prescribed medications to achieve LDL 70mg , HDL >40mg .    Expected Outcomes  Short Term: Participant states understanding of desired cholesterol values and is compliant with medications prescribed. Participant is following exercise prescription and nutrition guidelines.;Long Term: Cholesterol controlled with medications as prescribed, with individualized exercise RX and with personalized nutrition plan. Value goals: LDL < 70mg , HDL > 40 mg.    Personal Goal Other  Yes    Personal Goal  Improve balance.    Intervention  Provide exercise, stretching, and resistance training guidelines to help improve strength and functional capacity to help with balance.        Personal Goals Discharge: Goals and Risk Factor Review    Row Name 07/06/19 1652 07/30/19 1436 08/07/19 1211         Core Components/Risk Factors/Patient Goals Review   Personal Goals Review  Weight Management/Obesity;Heart Failure;Diabetes;Stress  Weight Management/Obesity;Heart Failure;Diabetes;Stress  Weight Management/Obesity;Heart Failure;Diabetes;Stress     Review  [name] completed light exerccise today at cardiac rehab without difficulty  [name] has had some issures with brady cardia and low CBG on last session. [name] enjoyes exercise when he attends. Patient remains deconditioned but gives good effort with exercise  [name]'s vital signs were sstalbe the last  few sessions. [name] completes cardiac rehab on 08/07/19 and hopefully will keep active at home     Expected Outcomes  [name] will continue to participate in phase 2 cardiac rehab for exercise, nutrition and lifestyle modifications  [name] will continue to participate in phase 2 cardiac rehab for exercise, nutrition and lifestyle modifications  [name] will continue to be active follow nutrtion and lifestyle modifications upon completion of phase 2 cardiac rehab.        Exercise Goals and Review: Exercise Goals    Row Name 06/30/19 1035             Exercise Goals   Increase Physical Activity  Yes       Intervention  Provide advice, education, support and counseling about physical activity/exercise needs.;Develop an individualized exercise prescription for aerobic and resistive training based on initial evaluation findings, risk stratification, comorbidities and participant's personal goals.       Expected Outcomes  Short Term: Attend rehab on a regular basis to increase amount of physical activity.;Long Term: Exercising regularly at least 3-5 days a week.;Long Term: Add in home exercise to make exercise part of routine and to increase amount of physical activity.       Increase Strength and Stamina  Yes       Intervention  Provide advice, education, support and counseling about physical activity/exercise needs.;Develop an individualized exercise prescription for aerobic and resistive training based on initial evaluation findings, risk stratification, comorbidities and participant's personal goals.       Expected Outcomes  Short Term: Increase workloads from initial exercise prescription for resistance, speed, and METs.;Short Term: Perform resistance training exercises routinely during rehab and add in resistance training at home;Long Term: Improve cardiorespiratory fitness, muscular  endurance and strength as measured by increased METs and functional capacity (6MWT)       Able to understand and use rate of  perceived exertion (RPE) scale  Yes       Intervention  Provide education and explanation on how to use RPE scale       Expected Outcomes  Short Term: Able to use RPE daily in rehab to express subjective intensity level;Long Term:  Able to use RPE to guide intensity level when exercising independently       Knowledge and understanding of Target Heart Rate Range (THRR)  Yes       Intervention  Provide education and explanation of THRR including how the numbers were predicted and where they are located for reference       Expected Outcomes  Short Term: Able to state/look up THRR;Long Term: Able to use THRR to govern intensity when exercising independently;Short Term: Able to use daily as guideline for intensity in rehab       Able to check pulse independently  Yes       Intervention  Provide education and demonstration on how to check pulse in carotid and radial arteries.;Review the importance of being able to check your own pulse for safety during independent exercise       Expected Outcomes  Short Term: Able to explain why pulse checking is important during independent exercise;Long Term: Able to check pulse independently and accurately       Understanding of Exercise Prescription  Yes       Intervention  Provide education, explanation, and written materials on patient's individual exercise prescription       Expected Outcomes  Short Term: Able to explain program exercise prescription;Long Term: Able to explain home exercise prescription to exercise independently          Exercise Goals Re-Evaluation:   Nutrition & Weight - Outcomes: Pre Biometrics - 06/30/19 1015      Pre Biometrics   Waist Circumference  34 inches    Hip Circumference  38.5 inches    Waist to Hip Ratio  0.88 %    Triceps Skinfold  9 mm    % Body Fat  21 %    Grip Strength  37 kg    Flexibility  0 in    Single Leg Stand  0 seconds        Nutrition:   Nutrition Discharge: Nutrition Assessments - 07/16/19 1121       MEDFICTS Scores   Pre Score  27       Education Questionnaire Score: Knowledge Questionnaire Score - 07/06/19 1649      Knowledge Questionnaire Score   Pre Score  20/24       [name] attended 8 exercise sessions between 07/06/19-08/07/19. [name]'s attendance was fair. [name] had some issues with brady cardia and did not feel as though he progresses as he did when he completed cardiac rehab previously. [name] completed the program early as he prepares to downsize and move his disabled son to a skilled care facility.Barnet Pall, RN,BSN 09/01/2019 2:07 PM

## 2019-08-10 ENCOUNTER — Encounter (HOSPITAL_COMMUNITY): Payer: PPO

## 2019-08-12 ENCOUNTER — Encounter (HOSPITAL_COMMUNITY): Payer: PPO

## 2019-08-13 ENCOUNTER — Ambulatory Visit (HOSPITAL_BASED_OUTPATIENT_CLINIC_OR_DEPARTMENT_OTHER)
Admission: RE | Admit: 2019-08-13 | Discharge: 2019-08-13 | Disposition: A | Discharge status: Home / Self Care | Payer: PPO | Source: Ambulatory Visit | Attending: Adult Health | Admitting: Adult Health

## 2019-08-13 ENCOUNTER — Encounter (HOSPITAL_COMMUNITY): Payer: Self-pay

## 2019-08-13 ENCOUNTER — Other Ambulatory Visit: Payer: Self-pay

## 2019-08-13 VITALS — BP 122/68 | HR 97 | Wt 140.0 lb

## 2019-08-13 DIAGNOSIS — Z7984 Long term (current) use of oral hypoglycemic drugs: Secondary | ICD-10-CM | POA: Diagnosis not present

## 2019-08-13 DIAGNOSIS — Z20822 Contact with and (suspected) exposure to covid-19: Secondary | ICD-10-CM | POA: Diagnosis not present

## 2019-08-13 DIAGNOSIS — E1165 Type 2 diabetes mellitus with hyperglycemia: Secondary | ICD-10-CM | POA: Diagnosis not present

## 2019-08-13 DIAGNOSIS — N185 Chronic kidney disease, stage 5: Secondary | ICD-10-CM | POA: Diagnosis not present

## 2019-08-13 DIAGNOSIS — I480 Paroxysmal atrial fibrillation: Secondary | ICD-10-CM | POA: Diagnosis not present

## 2019-08-13 DIAGNOSIS — D631 Anemia in chronic kidney disease: Secondary | ICD-10-CM | POA: Diagnosis not present

## 2019-08-13 DIAGNOSIS — E7800 Pure hypercholesterolemia, unspecified: Secondary | ICD-10-CM | POA: Diagnosis not present

## 2019-08-13 DIAGNOSIS — E869 Volume depletion, unspecified: Secondary | ICD-10-CM | POA: Diagnosis not present

## 2019-08-13 DIAGNOSIS — I129 Hypertensive chronic kidney disease with stage 1 through stage 4 chronic kidney disease, or unspecified chronic kidney disease: Secondary | ICD-10-CM | POA: Diagnosis not present

## 2019-08-13 DIAGNOSIS — N179 Acute kidney failure, unspecified: Secondary | ICD-10-CM | POA: Diagnosis not present

## 2019-08-13 DIAGNOSIS — Z79899 Other long term (current) drug therapy: Secondary | ICD-10-CM | POA: Diagnosis not present

## 2019-08-13 DIAGNOSIS — I509 Heart failure, unspecified: Secondary | ICD-10-CM | POA: Diagnosis not present

## 2019-08-13 DIAGNOSIS — E785 Hyperlipidemia, unspecified: Secondary | ICD-10-CM | POA: Diagnosis not present

## 2019-08-13 DIAGNOSIS — E1122 Type 2 diabetes mellitus with diabetic chronic kidney disease: Secondary | ICD-10-CM | POA: Diagnosis not present

## 2019-08-13 DIAGNOSIS — I43 Cardiomyopathy in diseases classified elsewhere: Secondary | ICD-10-CM | POA: Diagnosis not present

## 2019-08-13 DIAGNOSIS — I252 Old myocardial infarction: Secondary | ICD-10-CM | POA: Diagnosis not present

## 2019-08-13 DIAGNOSIS — T502X5A Adverse effect of carbonic-anhydrase inhibitors, benzothiadiazides and other diuretics, initial encounter: Secondary | ICD-10-CM | POA: Diagnosis not present

## 2019-08-13 DIAGNOSIS — Z87891 Personal history of nicotine dependence: Secondary | ICD-10-CM | POA: Diagnosis not present

## 2019-08-13 DIAGNOSIS — I272 Pulmonary hypertension, unspecified: Secondary | ICD-10-CM | POA: Diagnosis not present

## 2019-08-13 DIAGNOSIS — I255 Ischemic cardiomyopathy: Secondary | ICD-10-CM

## 2019-08-13 DIAGNOSIS — Z7901 Long term (current) use of anticoagulants: Secondary | ICD-10-CM | POA: Diagnosis not present

## 2019-08-13 DIAGNOSIS — R001 Bradycardia, unspecified: Secondary | ICD-10-CM | POA: Diagnosis not present

## 2019-08-13 DIAGNOSIS — I5022 Chronic systolic (congestive) heart failure: Secondary | ICD-10-CM

## 2019-08-13 DIAGNOSIS — I130 Hypertensive heart and chronic kidney disease with heart failure and stage 1 through stage 4 chronic kidney disease, or unspecified chronic kidney disease: Secondary | ICD-10-CM | POA: Diagnosis not present

## 2019-08-13 DIAGNOSIS — E782 Mixed hyperlipidemia: Secondary | ICD-10-CM | POA: Diagnosis not present

## 2019-08-13 DIAGNOSIS — I4891 Unspecified atrial fibrillation: Secondary | ICD-10-CM | POA: Diagnosis not present

## 2019-08-13 DIAGNOSIS — N183 Chronic kidney disease, stage 3 unspecified: Secondary | ICD-10-CM | POA: Diagnosis not present

## 2019-08-13 DIAGNOSIS — N184 Chronic kidney disease, stage 4 (severe): Secondary | ICD-10-CM | POA: Diagnosis not present

## 2019-08-13 DIAGNOSIS — E1129 Type 2 diabetes mellitus with other diabetic kidney complication: Secondary | ICD-10-CM | POA: Diagnosis not present

## 2019-08-13 DIAGNOSIS — Z951 Presence of aortocoronary bypass graft: Secondary | ICD-10-CM | POA: Diagnosis not present

## 2019-08-13 DIAGNOSIS — D649 Anemia, unspecified: Secondary | ICD-10-CM | POA: Diagnosis not present

## 2019-08-13 DIAGNOSIS — I132 Hypertensive heart and chronic kidney disease with heart failure and with stage 5 chronic kidney disease, or end stage renal disease: Secondary | ICD-10-CM | POA: Diagnosis not present

## 2019-08-13 DIAGNOSIS — I2510 Atherosclerotic heart disease of native coronary artery without angina pectoris: Secondary | ICD-10-CM | POA: Diagnosis not present

## 2019-08-13 DIAGNOSIS — Z95810 Presence of automatic (implantable) cardiac defibrillator: Secondary | ICD-10-CM | POA: Diagnosis not present

## 2019-08-13 DIAGNOSIS — I10 Essential (primary) hypertension: Secondary | ICD-10-CM | POA: Diagnosis not present

## 2019-08-13 DIAGNOSIS — R42 Dizziness and giddiness: Secondary | ICD-10-CM | POA: Diagnosis not present

## 2019-08-13 LAB — CBC
HCT: 33.2 % — ABNORMAL LOW (ref 39.0–52.0)
Hemoglobin: 10.9 g/dL — ABNORMAL LOW (ref 13.0–17.0)
MCH: 30.8 pg (ref 26.0–34.0)
MCHC: 32.8 g/dL (ref 30.0–36.0)
MCV: 93.8 fL (ref 80.0–100.0)
Platelets: 257 10*3/uL (ref 150–400)
RBC: 3.54 MIL/uL — ABNORMAL LOW (ref 4.22–5.81)
RDW: 18.1 % — ABNORMAL HIGH (ref 11.5–15.5)
WBC: 9.2 10*3/uL (ref 4.0–10.5)
nRBC: 0 % (ref 0.0–0.2)

## 2019-08-13 LAB — BASIC METABOLIC PANEL
Anion gap: 18 — ABNORMAL HIGH (ref 5–15)
BUN: 111 mg/dL — ABNORMAL HIGH (ref 8–23)
CO2: 29 mmol/L (ref 22–32)
Calcium: 9.6 mg/dL (ref 8.9–10.3)
Chloride: 89 mmol/L — ABNORMAL LOW (ref 98–111)
Creatinine, Ser: 5.1 mg/dL — ABNORMAL HIGH (ref 0.61–1.24)
GFR calc Af Amer: 11 mL/min — ABNORMAL LOW (ref >60)
GFR calc non Af Amer: 10 mL/min — ABNORMAL LOW (ref >60)
Glucose, Bld: 66 mg/dL — ABNORMAL LOW (ref 70–99)
Potassium: 3.9 mmol/L (ref 3.5–5.1)
Sodium: 136 mmol/L (ref 135–145)

## 2019-08-13 MED ORDER — TORSEMIDE 20 MG PO TABS: ORAL_TABLET | ORAL | 6 refills | Status: DC

## 2019-08-13 NOTE — Patient Instructions (Signed)
HOLD Torsemide for 3 days  RESTART Torsemide at decreased dose (take 40mg  in the AM and 20mg  in the PM).  Keep follow up appointment next week

## 2019-08-13 NOTE — Progress Notes (Signed)
PCP: Shirline Frees, MD Cardiology: Dr. Angelena Form  84 y.o. with h/o CAD s/p CABG, ischemic cardiomyopathy, and diabetes was referred by Dr. Angelena Form for evaluation of CHF.  Patient had CABG in 2007.  Last cath in 2016 showed all 4 grafts patent.  Echo was 25-30% in 2016.  Most recent echo in 8/20 showed EF 20-25% with moderate RV dysfunction.  He has had a recurrent right pleural effusion with thoracenteses in 5/19 and 8/20.  He thinks that he has been in atrial fibrillation persistently for about a year.  He used to take amiodarone but stopped it because he "felt bad."  However, he does not think that stopping amiodarone made him feel any better.  Creatinine has been elevated, CKD stage 3.   At last appointment, I thought he was volume overloaded and increased Lasix, but his creatinine steadily rose (2.55 => 3.13 => 3.72). I stopped Entresto and he never started spironolactone.  Lasix was cut back to 60 mg daily. He says that during this time, he got his COVID vaccination and after that, he became severely nauseated for a number of days and ate/drank very little.  This eventually resolved.   On 05/11/19, cardioverted him back to NSR.    Later in 2/21, he had RHC.  This showed elevated right and left heart filling pressures with low but not markedly low cardiac output.  He was admitted for IV diuresis.  While diuresing, he was maintained on milrinone.  This was weaned prior to discharge. PYP scan done in the hospital was equivocal for ATTR amyloidosis.   Today he returns for an acute HF visit due to 20 pound weight loss. Overall feeling terrible. Complaining of dizziness and poor appetite. Denies SOB/PND/Orthopnea. Appetite fair. No fever or chills. Weight at home has  gone down from 160--->140 pounds. Taking all medications.  Labs (12/20): K 4.5, creatinine 2.01 => 1.84  Labs (2/21): K 3.9, creatinine 2.55 => 3.13 => 3.72 Labs (3/21): hgb 11.1, K 3.6 => 4.6, creatinine 3.04 => 3.64, myeloma panel  negative, urine immunofixation negative. TSH and LFTs normal.   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 Type 2 diabetes  2. HTN 3. Hyperlipidemia 4. CAD: CABG x 4 in 2007 with LIMA-LAD, SVG-OM, sequential SVG-PLV/PDA.   - LHC (2016): Patent grafts.  5. Atrial fibrillation: Persistent.  - DCCV to NSR 2/21.  6. CKD stage 4.  7. Carotid stenosis:  - Carotid dopplers (8/20): 60-79% RICA stenosis.  8. Chronic systolic CHF: Ischemic cardiomyopathy.    - Echo (2016): EF 25-30% - Echo (2018): EF 25% - Echo (8/20): EF 20-25%, moderately decreased RV systolic function, severe LAE.  - St Jude ICD.   - RHC (3/21): mean RA 18, PA 71/27, mean PCWP 24, CI 2.16 (thermo), CI 2.2 (Fick), PVR 4.6 WU, PAPi 2.4.  - PYP scan (3/21): grade 1 but H/CL ratio 1.6.  9. Pleural effusion on right: s/p thoracentesis in 5/19 and 8/20.   Social History   Socioeconomic History  . Marital status: Married    Spouse name: Not on file  . Number of children: 2  . Years of education: Not on file  . Highest education level: Not on file  Occupational History  . Occupation: Horticulturist, commercial: RETIRED  Tobacco Use  . Smoking status: Former Smoker    Types: Cigarettes    Quit date: 03/26/1970    Years since quitting: 49.4  . Smokeless tobacco: Never Used  Substance and Sexual Activity  . Alcohol use: Yes    Alcohol/week: 1.0 standard drinks    Types: 1 Glasses of wine per week  . Drug use: No  . Sexual activity: Not on file  Other Topics Concern  . Not on file  Social History Narrative  . Not on file   Social Determinants of Health   Financial Resource Strain:   . Difficulty of Paying Living Expenses:   Food Insecurity:   . Worried About Charity fundraiser in the Last Year:   . Arboriculturist in the Last Year:   Transportation Needs: No Transportation Needs  . Lack of Transportation (Medical): No  . Lack of Transportation (Non-Medical): No  Physical  Activity: Inactive  . Days of Exercise per Week: 0 days  . Minutes of Exercise per Session: 0 min  Stress:   . Feeling of Stress :   Social Connections:   . Frequency of Communication with Friends and Family:   . Frequency of Social Gatherings with Friends and Family:   . Attends Religious Services:   . Active Member of Clubs or Organizations:   . Attends Archivist Meetings:   [name] Marital Status:   Intimate Partner Violence:   . Fear of Current or Ex-Partner:   . Emotionally Abused:   [name] Physically Abused:   . Sexually Abused:    Family History  Problem Relation Age of Onset  . Heart disease Father 108       Heart disease before age 26  . Heart attack Father   . Hypertension Father   . Hyperlipidemia Father   . Varicose Veins Father   . Stroke Father        ? not sure  . Colon cancer Maternal Aunt   . Colon cancer Maternal Uncle   . Diverticulitis Other   . Heart attack Other    ROS: All systems reviewed and negative except as per HPI.   Current Outpatient Medications  Medication Sig Dispense Refill  . amiodarone (PACERONE) 200 MG tablet Take 1 tablet (200 mg total) by mouth daily. 30 tablet 4  . apixaban (ELIQUIS) 2.5 MG TABS tablet Take 1 tablet (2.5 mg total) by mouth 2 (two) times daily. 180 tablet 1  . calcium-vitamin D (OSCAL WITH D) 500-200 MG-UNIT per tablet Take 1 tablet by mouth daily.     . Cholecalciferol (VITAMIN D3) 2000 UNITS TABS Take 2,000 Units by mouth daily. 50 mcg    . Coenzyme Q10 (CO Q 10) 100 MG CAPS Take 300 mg by mouth daily.     . fish oil-omega-3 fatty acids 1000 MG capsule Take 1 g by mouth daily.      [name] gabapentin (NEURONTIN) 300 MG capsule Take 300 mg by mouth 2 (two) times daily as needed (Nerve pain).     [name] glipiZIDE (GLUCOTROL) 10 MG tablet Take 10 mg by mouth 2 (two) times daily before a meal.    . metFORMIN (GLUCOPHAGE) 1000 MG tablet Take 1,000 mg by mouth 2 (two) times daily.    . metoprolol succinate (TOPROL-XL) 25 MG 24 hr  tablet Take 0.5 tablets (12.5 mg total) by mouth daily. 90 tablet 3  . Multiple Vitamin (MULTI-DAY VITAMINS PO) Take 1 tablet by mouth daily.     [name] omeprazole (PRILOSEC) 20 MG capsule Take 20 mg by mouth 2 (two) times daily before a meal.     . simvastatin (ZOCOR) 20 MG tablet Take 20 mg by  mouth at bedtime.     . torsemide (DEMADEX) 20 MG tablet Take 3 tablets (60 mg total) by mouth every morning AND 2 tablets (40 mg total) every evening. 155 tablet 6  . Turmeric 450 MG CAPS Take 450 mg by mouth daily.     . vitamin C (ASCORBIC ACID) 250 MG tablet Take 250 mg by mouth daily.     . vitamin E 1000 UNIT capsule Take 1,000 Units by mouth every other day.     No current facility-administered medications for this encounter.   BP 122/68   Pulse 97   SpO2 (!) 68%   Wt Readings from Last 3 Encounters:  07/20/19 73.8 kg (162 lb 12.8 oz)  07/13/19 75.5 kg (166 lb 6.4 oz)  07/06/19 73.5 kg (162 lb 0.6 oz)   General:  Elderly. Arrived in a wheel chair. Appears weak. No resp difficulty HEENT: normal Neck: supple. no JVD. Carotids 2+ bilat; no bruits. No lymphadenopathy or thryomegaly appreciated. Cor: PMI nondisplaced. Regular rate & rhythm. No rubs, gallops or murmurs. Lungs: clear Abdomen: soft, nontender, nondistended. No hepatosplenomegaly. No bruits or masses. Good bowel sounds. Extremities: no cyanosis, clubbing, rash, edema Neuro: alert & orientedx3, cranial nerves grossly intact. moves all 4 extremities w/o difficulty. Affect pleasant\    Assessment/Plan: 1. Chronic systolic CHF: Ischemic cardiomyopathy.  Echo in 8/20 with EF 20-25%, moderately decreased RV systolic function.  St Jude ICD.  CHF has been complicated by CKD stage 3 with recent AKI.  RHC in 2/21 showed low but not markedly low cardiac output, he was admitted for diuresis with milrinone.  Milrinone was weaned off.   Weight down 20 pounds from lasix visit. Reds Clip 27%. Volume depleted.  - Give 1 liter NS now. Hold torsemide  for the next 3 days then he will start torsemide 40 mg/20 mg.   - He will stay off Entresto and spironolactone with elevated creatinine.  - Continue Toprol XL 12.5 mg daily (decreased due to bradycardia).  - Narrow QRS, not candidate for upgrade to CRT.  - PYP scan in 3/21 was equivocal (grade 1 but with H/CL 1.67).  Myeloma workup negative.  I will repeat PYP scan in 5/21.  - Check BMET now.  2. CAD: S/p CABG 2007, patent grafts on LHC in 2016. No exertional chest pain.  - Given stable CAD and Eliquis use, he is not on ASA.  - Continue statin.  3. Atrial fibrillation: Paroxysmal.  He remains in NSR today.   - Continue amiodarone 200 mg daily.  Check LFTs today, recent TSH was normal.  He will need a regular eye exam while on amiodarone.  4. Right chronic pleural effusion: Only small effusion on recent CXR.  5. AKI on CKD stage 4: Creatinine up from 3.5>5.1 .  Discussed he needs to avoid NSAIDS.  - Nephrology referral.  6. Carotid stenosis: Repeat dopplers in 8/21.    Give 1 liter NS now. Check CBC and BMET now. CBC ok. Renal function elevated.  Has AKI. Discussed with Dr Aundra Dubin.   Greater than 50% of the (total minutes 45) visit spent in counseling/coordination of care regarding the above.  Follow up next week. Check Reds Clip and BMET.     Laniya Friedl NP_C  08/13/2019

## 2019-08-14 ENCOUNTER — Encounter (HOSPITAL_COMMUNITY): Payer: PPO

## 2019-08-14 ENCOUNTER — Encounter (HOSPITAL_COMMUNITY): Payer: Self-pay | Admitting: Internal Medicine

## 2019-08-14 ENCOUNTER — Inpatient Hospital Stay (HOSPITAL_COMMUNITY)
Admission: EM | Admit: 2019-08-14 | Discharge: 2019-08-16 | DRG: 683 | Disposition: A | Discharge status: Home / Self Care | Payer: PPO | Attending: Infectious Disease | Admitting: Infectious Disease

## 2019-08-14 ENCOUNTER — Other Ambulatory Visit: Payer: Self-pay

## 2019-08-14 ENCOUNTER — Emergency Department (HOSPITAL_COMMUNITY): Payer: PPO

## 2019-08-14 ENCOUNTER — Telehealth (HOSPITAL_COMMUNITY): Payer: Self-pay | Admitting: *Deleted

## 2019-08-14 DIAGNOSIS — I255 Ischemic cardiomyopathy: Secondary | ICD-10-CM | POA: Diagnosis present

## 2019-08-14 DIAGNOSIS — I5022 Chronic systolic (congestive) heart failure: Secondary | ICD-10-CM | POA: Diagnosis present

## 2019-08-14 DIAGNOSIS — I129 Hypertensive chronic kidney disease with stage 1 through stage 4 chronic kidney disease, or unspecified chronic kidney disease: Secondary | ICD-10-CM | POA: Diagnosis not present

## 2019-08-14 DIAGNOSIS — I480 Paroxysmal atrial fibrillation: Secondary | ICD-10-CM | POA: Diagnosis present

## 2019-08-14 DIAGNOSIS — Z7901 Long term (current) use of anticoagulants: Secondary | ICD-10-CM | POA: Diagnosis not present

## 2019-08-14 DIAGNOSIS — D631 Anemia in chronic kidney disease: Secondary | ICD-10-CM | POA: Diagnosis present

## 2019-08-14 DIAGNOSIS — I43 Cardiomyopathy in diseases classified elsewhere: Secondary | ICD-10-CM | POA: Diagnosis present

## 2019-08-14 DIAGNOSIS — N184 Chronic kidney disease, stage 4 (severe): Secondary | ICD-10-CM | POA: Diagnosis not present

## 2019-08-14 DIAGNOSIS — E782 Mixed hyperlipidemia: Secondary | ICD-10-CM | POA: Diagnosis not present

## 2019-08-14 DIAGNOSIS — Z7984 Long term (current) use of oral hypoglycemic drugs: Secondary | ICD-10-CM

## 2019-08-14 DIAGNOSIS — Z20822 Contact with and (suspected) exposure to covid-19: Secondary | ICD-10-CM | POA: Diagnosis present

## 2019-08-14 DIAGNOSIS — E11649 Type 2 diabetes mellitus with hypoglycemia without coma: Secondary | ICD-10-CM | POA: Diagnosis not present

## 2019-08-14 DIAGNOSIS — I10 Essential (primary) hypertension: Secondary | ICD-10-CM | POA: Diagnosis present

## 2019-08-14 DIAGNOSIS — Z95810 Presence of automatic (implantable) cardiac defibrillator: Secondary | ICD-10-CM | POA: Diagnosis not present

## 2019-08-14 DIAGNOSIS — N185 Chronic kidney disease, stage 5: Secondary | ICD-10-CM | POA: Diagnosis present

## 2019-08-14 DIAGNOSIS — I132 Hypertensive heart and chronic kidney disease with heart failure and with stage 5 chronic kidney disease, or end stage renal disease: Secondary | ICD-10-CM | POA: Diagnosis present

## 2019-08-14 DIAGNOSIS — E785 Hyperlipidemia, unspecified: Secondary | ICD-10-CM | POA: Diagnosis present

## 2019-08-14 DIAGNOSIS — I272 Pulmonary hypertension, unspecified: Secondary | ICD-10-CM | POA: Diagnosis present

## 2019-08-14 DIAGNOSIS — E1165 Type 2 diabetes mellitus with hyperglycemia: Secondary | ICD-10-CM | POA: Diagnosis present

## 2019-08-14 DIAGNOSIS — E1122 Type 2 diabetes mellitus with diabetic chronic kidney disease: Secondary | ICD-10-CM | POA: Diagnosis present

## 2019-08-14 DIAGNOSIS — Z79899 Other long term (current) drug therapy: Secondary | ICD-10-CM

## 2019-08-14 DIAGNOSIS — I25810 Atherosclerosis of coronary artery bypass graft(s) without angina pectoris: Secondary | ICD-10-CM | POA: Diagnosis present

## 2019-08-14 DIAGNOSIS — D649 Anemia, unspecified: Secondary | ICD-10-CM | POA: Diagnosis not present

## 2019-08-14 DIAGNOSIS — T502X5A Adverse effect of carbonic-anhydrase inhibitors, benzothiadiazides and other diuretics, initial encounter: Secondary | ICD-10-CM | POA: Diagnosis present

## 2019-08-14 DIAGNOSIS — Z951 Presence of aortocoronary bypass graft: Secondary | ICD-10-CM | POA: Diagnosis not present

## 2019-08-14 DIAGNOSIS — E11319 Type 2 diabetes mellitus with unspecified diabetic retinopathy without macular edema: Secondary | ICD-10-CM | POA: Diagnosis present

## 2019-08-14 DIAGNOSIS — E7800 Pure hypercholesterolemia, unspecified: Secondary | ICD-10-CM | POA: Diagnosis present

## 2019-08-14 DIAGNOSIS — E869 Volume depletion, unspecified: Secondary | ICD-10-CM | POA: Diagnosis present

## 2019-08-14 DIAGNOSIS — E1129 Type 2 diabetes mellitus with other diabetic kidney complication: Secondary | ICD-10-CM | POA: Diagnosis present

## 2019-08-14 DIAGNOSIS — R42 Dizziness and giddiness: Secondary | ICD-10-CM

## 2019-08-14 DIAGNOSIS — I429 Cardiomyopathy, unspecified: Secondary | ICD-10-CM

## 2019-08-14 DIAGNOSIS — Z87891 Personal history of nicotine dependence: Secondary | ICD-10-CM | POA: Diagnosis not present

## 2019-08-14 DIAGNOSIS — I252 Old myocardial infarction: Secondary | ICD-10-CM | POA: Diagnosis not present

## 2019-08-14 DIAGNOSIS — N179 Acute kidney failure, unspecified: Principal | ICD-10-CM

## 2019-08-14 DIAGNOSIS — N183 Chronic kidney disease, stage 3 unspecified: Secondary | ICD-10-CM | POA: Diagnosis not present

## 2019-08-14 DIAGNOSIS — I2510 Atherosclerotic heart disease of native coronary artery without angina pectoris: Secondary | ICD-10-CM | POA: Diagnosis present

## 2019-08-14 LAB — SARS CORONAVIRUS 2 BY RT PCR (HOSPITAL ORDER, PERFORMED IN ~~LOC~~ HOSPITAL LAB): SARS Coronavirus 2: NEGATIVE

## 2019-08-14 LAB — CBG MONITORING, ED
Glucose-Capillary: 29 mg/dL — CL (ref 70–99)
Glucose-Capillary: 40 mg/dL — CL (ref 70–99)
Glucose-Capillary: 43 mg/dL — CL (ref 70–99)
Glucose-Capillary: 137 mg/dL — ABNORMAL HIGH (ref 70–99)

## 2019-08-14 LAB — CBC
HCT: 32.8 % — ABNORMAL LOW (ref 39.0–52.0)
Hemoglobin: 10.6 g/dL — ABNORMAL LOW (ref 13.0–17.0)
MCH: 30 pg (ref 26.0–34.0)
MCHC: 32.3 g/dL (ref 30.0–36.0)
MCV: 92.9 fL (ref 80.0–100.0)
Platelets: 231 10*3/uL (ref 150–400)
RBC: 3.53 MIL/uL — ABNORMAL LOW (ref 4.22–5.81)
RDW: 18.3 % — ABNORMAL HIGH (ref 11.5–15.5)
WBC: 8.8 10*3/uL (ref 4.0–10.5)
nRBC: 0 % (ref 0.0–0.2)

## 2019-08-14 LAB — BASIC METABOLIC PANEL
Anion gap: 16 — ABNORMAL HIGH (ref 5–15)
BUN: 105 mg/dL — ABNORMAL HIGH (ref 8–23)
CO2: 27 mmol/L (ref 22–32)
Calcium: 8.9 mg/dL (ref 8.9–10.3)
Chloride: 90 mmol/L — ABNORMAL LOW (ref 98–111)
Creatinine, Ser: 5.11 mg/dL — ABNORMAL HIGH (ref 0.61–1.24)
GFR calc Af Amer: 11 mL/min — ABNORMAL LOW (ref >60)
GFR calc non Af Amer: 10 mL/min — ABNORMAL LOW (ref >60)
Glucose, Bld: 120 mg/dL — ABNORMAL HIGH (ref 70–99)
Potassium: 4.1 mmol/L (ref 3.5–5.1)
Sodium: 133 mmol/L — ABNORMAL LOW (ref 135–145)

## 2019-08-14 LAB — URINALYSIS, ROUTINE W REFLEX MICROSCOPIC
Bacteria, UA: NONE SEEN
Bilirubin Urine: NEGATIVE
Glucose, UA: NEGATIVE mg/dL
Hgb urine dipstick: NEGATIVE
Ketones, ur: NEGATIVE mg/dL
Leukocytes,Ua: NEGATIVE
Nitrite: NEGATIVE
Protein, ur: 30 mg/dL — AB
Specific Gravity, Urine: 1.011 (ref 1.005–1.030)
pH: 7 (ref 5.0–8.0)

## 2019-08-14 LAB — GLUCOSE, CAPILLARY: Glucose-Capillary: 172 mg/dL — ABNORMAL HIGH (ref 70–99)

## 2019-08-14 MED ORDER — ONDANSETRON HCL 4 MG PO TABS: 4.0000 mg | ORAL_TABLET | Freq: Four times a day (QID) | ORAL | Status: DC | PRN

## 2019-08-14 MED ORDER — METOPROLOL SUCCINATE ER 25 MG PO TB24
12.5000 mg | ORAL_TABLET | Freq: Every day | ORAL | Status: DC
  Administered 2019-08-15 – 2019-08-16 (×2): 12.5 mg via ORAL
  Filled 2019-08-14 (×2): qty 1

## 2019-08-14 MED ORDER — ONDANSETRON HCL 4 MG/2ML IJ SOLN: 4.0000 mg | Freq: Four times a day (QID) | INTRAMUSCULAR | Status: DC | PRN

## 2019-08-14 MED ORDER — AMIODARONE HCL 200 MG PO TABS
200.0000 mg | ORAL_TABLET | Freq: Every day | ORAL | Status: DC
  Administered 2019-08-15 – 2019-08-16 (×2): 200 mg via ORAL
  Filled 2019-08-14 (×2): qty 1

## 2019-08-14 MED ORDER — DEXTROSE 50 % IV SOLN
1.0000 | Freq: Once | INTRAVENOUS | Status: AC
  Administered 2019-08-14: 50 mL via INTRAVENOUS
  Filled 2019-08-14: qty 50

## 2019-08-14 MED ORDER — SODIUM CHLORIDE 0.9 % IV SOLN: INTRAVENOUS | Status: DC

## 2019-08-14 MED ORDER — APIXABAN 2.5 MG PO TABS
2.5000 mg | ORAL_TABLET | Freq: Two times a day (BID) | ORAL | Status: DC
  Administered 2019-08-14 – 2019-08-16 (×4): 2.5 mg via ORAL
  Filled 2019-08-14 (×4): qty 1

## 2019-08-14 MED ORDER — SIMVASTATIN 20 MG PO TABS
20.0000 mg | ORAL_TABLET | Freq: Every day | ORAL | Status: DC
  Administered 2019-08-14 – 2019-08-15 (×2): 20 mg via ORAL
  Filled 2019-08-14 (×2): qty 1

## 2019-08-14 MED ORDER — SODIUM CHLORIDE 0.9% FLUSH
3.0000 mL | Freq: Once | INTRAVENOUS | Status: AC
  Administered 2019-08-14: 3 mL via INTRAVENOUS

## 2019-08-14 MED ORDER — SODIUM CHLORIDE 0.9 % IV SOLN: Freq: Once | INTRAVENOUS | Status: AC

## 2019-08-14 NOTE — ED Provider Notes (Signed)
[facility] EMERGENCY DEPARTMENT Provider Note   CSN: [id number] Arrival date & time: 08/14/19  1339     History Chief Complaint  Patient presents with  . Dizziness    [name] Bogert. is a 84 y.o. male.  Patient sent from primary care doctor's office for concern for dehydration.  Patient recently has been on increased dose of his diuretic for the last several weeks.  Had blood work done yesterday at cardiology and creatinine was elevated above his baseline.  He has been having dizziness with change of positions.  The history is provided by the patient.  Near Syncope This is a new problem. The current episode started more than 2 days ago. The problem occurs daily. The problem has not changed since onset.Pertinent negatives include no chest pain, no abdominal pain, no headaches and no shortness of breath. Nothing aggravates the symptoms. He has tried nothing for the symptoms. The treatment provided no relief.       Past Medical History:  Diagnosis Date  . ANEMIA, HX OF   . Bronchial pneumonia Jan. 2016  . CAD, ARTERY BYPASS GRAFT   . CAROTID ARTERY STENOSIS   . Cataract   . DIABETES MELLITUS, TYPE II   . Diabetic retinal damage of right eye ([place]) 2015  . Diverticulosis   . HYPERCHOLESTEROLEMIA   . HYPERTENSION   . Myocardial infarction [facility]) June 2007  . New onset atrial fibrillation ([place]) 06/24/2014  . PULMONARY HYPERTENSION   . RENAL INSUFFICIENCY, CHRONIC   . UNSPECIFIED THROMBOCYTOPENIA     Patient Active Problem List   Diagnosis Date Noted  . Acute renal failure (ARF) ([place]) 08/14/2019  . CHF (congestive heart failure) ([place]) 05/22/2019  . Pleural effusion 10/13/2018  . Dyspnea 06/02/2018  . Ischemic cardiomyopathy 06/11/2017  . Cardiomyopathy- new drop in EF- ? secondary to new AF 06/25/2014  . Coronary artery disease involving coronary bypass graft of native heart with unstable angina pectoris ([place])   . Atrial fibrillation with rapid  ventricular response ([place]) 06/24/2014  . UNSPECIFIED THROMBOCYTOPENIA 07/26/2008  . Pulmonary hypertension-PA 51 mmHg  07/26/2008  . Type 2 diabetes mellitus with renal manifestations, controlled ([place]) 07/22/2008  . Dyslipidemia 07/22/2008  . Essential hypertension 07/22/2008  . 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 PVD - moderate bilateral carotid disease 07/22/2008  . Chronic renal insufficiency, stage III (moderate) 07/22/2008  . ANEMIA, HX OF 07/22/2008    Past Surgical History:  Procedure Laterality Date  . CARDIAC CATHETERIZATION  06/24/2014  . CARDIOVERSION [name] 05/11/2019   Procedure: CARDIOVERSION;  Surgeon: Larey Dresser, MD;  Location: [facility] ENDOSCOPY;  Service: Cardiovascular;  Laterality: [name];  . CATARACT EXTRACTION  2012   right eye  . COLONOSCOPY    . CORONARY ARTERY BYPASS GRAFT      quad bypass/2007  . EYE SURGERY    . ICD IMPLANT [name] 06/11/2017   Procedure: ICD IMPLANT;  Surgeon: Constance Haw, MD;  Location: [facility] CV LAB;  Service: Cardiovascular;  Laterality: [name];  . IR THORACENTESIS ASP PLEURAL SPACE W/IMG GUIDE  10/28/2018  . LEFT HEART CATHETERIZATION WITH CORONARY/GRAFT ANGIOGRAM [name] 06/24/2014   Procedure: LEFT HEART CATHETERIZATION WITH Beatrix Fetters;  Surgeon: Burnell Blanks, MD;  Location: [facility] CATH LAB;  Service: Cardiovascular;  Laterality: [name];  . POLYPECTOMY    . RIGHT HEART CATH [name] 05/22/2019   Procedure: RIGHT HEART CATH;  Surgeon: Larey Dresser, MD;  Location: [facility] CV LAB;  Service: Cardiovascular;  Laterality: [name];  . TONSILLECTOMY         Family History  Problem Relation Age of Onset  . Heart disease Father 28       Heart disease before age 7  . Heart attack Father   . Hypertension Father   . Hyperlipidemia Father   . Varicose Veins Father   . Stroke Father        ? not sure  . Colon cancer Maternal Aunt   . Colon cancer Maternal Uncle   . Diverticulitis Other   . Heart attack  Other     Social History   Tobacco Use  . Smoking status: Former Smoker    Types: Cigarettes    Quit date: 03/26/1970    Years since quitting: 49.4  . Smokeless tobacco: Never Used  Substance Use Topics  . Alcohol use: Yes    Alcohol/week: 1.0 standard drinks    Types: 1 Glasses of wine per week  . Drug use: No    Home Medications Prior to Admission medications   Medication Sig Start Date End Date Taking? Authorizing Provider  amiodarone (PACERONE) 200 MG tablet Take 1 tablet (200 mg total) by mouth daily. 05/25/19   Clegg, Amy D, NP  apixaban (ELIQUIS) 2.5 MG TABS tablet Take 1 tablet (2.5 mg total) by mouth 2 (two) times daily. 03/31/19   Burnell Blanks, MD  calcium-vitamin D (OSCAL WITH D) 500-200 MG-UNIT per tablet Take 1 tablet by mouth daily.     [provider]  Cholecalciferol (VITAMIN D3) 2000 UNITS TABS Take 2,000 Units by mouth daily. 50 mcg    [provider]  Coenzyme Q10 (CO Q 10) 100 MG CAPS Take 300 mg by mouth daily.     [provider]  fish oil-omega-3 fatty acids 1000 MG capsule Take 1 g by mouth daily.      [provider]  gabapentin (NEURONTIN) 300 MG capsule Take 300 mg by mouth 2 (two) times daily as needed (Nerve pain).  02/09/19   [provider]  glipiZIDE (GLUCOTROL) 10 MG tablet Take 10 mg by mouth 2 (two) times daily before a meal.    [provider]  metFORMIN (GLUCOPHAGE) 1000 MG tablet Take 1,000 mg by mouth 2 (two) times daily. 07/08/19   [provider]  metoprolol succinate (TOPROL-XL) 25 MG 24 hr tablet Take 0.5 tablets (12.5 mg total) by mouth daily. 07/15/19   Lyda Jester M, PA-C  Multiple Vitamin (MULTI-DAY VITAMINS PO) Take 1 tablet by mouth daily.     [provider]  omeprazole (PRILOSEC) 20 MG capsule Take 20 mg by mouth 2 (two) times daily before a meal.  11/15/10   [provider]  simvastatin (ZOCOR) 20 MG tablet Take 20 mg by mouth at bedtime.   10/12/10   [provider]  torsemide (DEMADEX) 20 MG tablet Take 2 tablets (40 mg total) by mouth every morning AND 1 tablet (20 mg total) every evening. 08/13/19   Clegg, Amy D, NP  Turmeric 450 MG CAPS Take 450 mg by mouth daily.     [provider]  vitamin C (ASCORBIC ACID) 250 MG tablet Take 250 mg by mouth daily.     [provider]  vitamin E 1000 UNIT capsule Take 1,000 Units by mouth every other day.    [provider]    Allergies    Patient has no known allergies.  Review of Systems   Review of  Systems  Constitutional: Negative for chills and fever.  HENT: Negative for ear pain and sore throat.   Eyes: Negative for pain and visual disturbance.  Respiratory: Negative for cough and shortness of breath.   Cardiovascular: Positive for near-syncope. Negative for chest pain and palpitations.  Gastrointestinal: Negative for abdominal pain and vomiting.  Genitourinary: Negative for dysuria and hematuria.  Musculoskeletal: Negative for arthralgias and back pain.  Skin: Negative for color change and rash.  Neurological: Positive for light-headedness. Negative for dizziness, seizures, syncope, facial asymmetry and headaches.  All other systems reviewed and are negative.   Physical Exam Updated Vital Signs  ED Triage Vitals  Enc Vitals Group     BP 08/14/19 1347 129/60     Pulse Rate 08/14/19 1347 (!) 51     Resp 08/14/19 1347 14     Temp 08/14/19 1347 (!) 97.3 F (36.3 C)     Temp Source 08/14/19 1347 Oral     SpO2 08/14/19 1347 100 %     Weight 08/14/19 1348 142 lb (64.4 kg)     Height 08/14/19 1348 5\' 11"  (1.803 m)     Head Circumference --      Peak Flow --      Pain Score --      Pain Loc --      Pain Edu? --      Excl. in [place]? --     Physical Exam Vitals and nursing note reviewed.  Constitutional:      General: He is not in acute distress.    Appearance: He is well-developed. He is not ill-appearing.  HENT:     Head:  Normocephalic and atraumatic.     Nose: Nose normal.     Mouth/Throat:     Mouth: Mucous membranes are dry.  Eyes:     Extraocular Movements: Extraocular movements intact.     Conjunctiva/sclera: Conjunctivae normal.     Pupils: Pupils are equal, round, and reactive to light.  Cardiovascular:     Rate and Rhythm: Normal rate and regular rhythm.     Pulses: Normal pulses.     Heart sounds: Normal heart sounds. No murmur.  Pulmonary:     Effort: Pulmonary effort is normal. No respiratory distress.     Breath sounds: Normal breath sounds.  Abdominal:     General: There is no distension.     Palpations: Abdomen is soft.     Tenderness: There is no abdominal tenderness.  Musculoskeletal:        General: No tenderness. Normal range of motion.     Cervical back: Normal range of motion and neck supple.  Skin:    General: Skin is warm and dry.  Neurological:     General: No focal deficit present.     Mental Status: He is alert and oriented to person, place, and time.     Cranial Nerves: No cranial nerve deficit.     Sensory: No sensory deficit.     Motor: No weakness.     Coordination: Coordination normal.     Comments: 5+ out of 5 strength throughout, normal sensation, no drift, normal finger-to-nose finger, normal speech  Psychiatric:        Mood and Affect: Mood normal.     ED Results / Procedures / Treatments   Labs (all labs ordered are listed, but only abnormal results are displayed) Labs Reviewed  BASIC METABOLIC PANEL - Abnormal; Notable for the following components:      Result  Value   Sodium 133 (*)    Chloride 90 (*)    Glucose, Bld 120 (*)    BUN 105 (*)    Creatinine, Ser 5.11 (*)    GFR calc non Af Amer 10 (*)    GFR calc Af Amer 11 (*)    Anion gap 16 (*)    All other components within normal limits  CBC - Abnormal; Notable for the following components:   RBC 3.53 (*)    Hemoglobin 10.6 (*)    HCT 32.8 (*)    RDW 18.3 (*)    All other components within  normal limits  URINALYSIS, ROUTINE W REFLEX MICROSCOPIC - Abnormal; Notable for the following components:   Protein, ur 30 (*)    All other components within normal limits  SARS CORONAVIRUS 2 BY RT PCR (HOSPITAL ORDER, [facility])  CBG MONITORING, ED    EKG EKG Interpretation  Date/Time:  Friday Aug 14 2019 13:55:40 EDT Ventricular Rate:  50 PR Interval:  214 QRS Duration: 116 QT Interval:  520 QTC Calculation: 474 R Axis:   -123 Text Interpretation: Sinus bradycardia with 1st degree A-V block Right ventricular hypertrophy with repolarization abnormality Lateral infarct , age undetermined Abnormal ECG Confirmed by Lennice Sites [phone]) on 08/14/2019 6:22:24 PM   Radiology DG Chest Portable 1 View  Result Date: 08/14/2019 CLINICAL DATA:  Dizziness EXAM: PORTABLE CHEST 1 VIEW COMPARISON:  05/22/2019 FINDINGS: Cardiac shadow remains enlarged. Defibrillator is again noted and stable. Postsurgical changes are seen. Aortic calcifications are noted. Chronic right-sided pleural effusion and atelectatic changes are noted. No new focal abnormality is seen. IMPRESSION: Chronic changes in the right base.  No acute abnormality noted. Electronically Signed   By: Inez Catalina M.D.   On: 08/14/2019 19:33    Procedures Procedures (including critical care time)  Medications Ordered in ED Medications  sodium chloride flush (NS) 0.9 % injection 3 mL (has no administration in time range)  0.9 %  sodium chloride infusion (has no administration in time range)    ED Course  I have reviewed the triage vital signs and the nursing notes.  Pertinent labs & imaging results that were available during my care of the patient were reviewed by me and considered in my medical decision making (see chart for details).    MDM Rules/Calculators/A&P                      [name] Linhares. is an 84 year old male with history of diabetes, heart failure, CAD who presents to the ED with  dizziness.  Patient with unremarkable vitals.  No fever.  Patient was sent by primary care doctor due to concern for dehydration.  Has been on increased dose of torsemide over the last several weeks.  Had blood work done yesterday and showed increased creatinine from baseline of 3.5-5.1.  Patient having orthostatic symptoms.  Has normal neurological exam.  Low concern for stroke.  Lab work today shows continued elevation in creatinine at 5.1.  Likely needs IV hydration and monitoring.  Otherwise electrolytes are unremarkable.  Chest x-ray shows no signs of infection.  No pleural effusion.  No signs of volume overload on exam.  Patient clinically appears dehydrated.  Dry mucous membranes.  Will start him on IV fluids and patient to be admitted to medicine for further care.  This chart was dictated using voice recognition software.  Despite best efforts to proofread,  errors can occur  which can change the documentation meaning.    Final Clinical Impression(s) / ED Diagnoses Final diagnoses:  AKI (acute kidney injury) ([place])  Dizziness    Rx / DC Orders ED Discharge Orders    None       Lennice Sites, DO 08/14/19 1942

## 2019-08-14 NOTE — ED Notes (Signed)
MD made aware of CBG

## 2019-08-14 NOTE — H&P (Signed)
History and Physical    [name] YQM:[id number] DOB: 03-Dec-1935 DOA: 08/14/2019  PCP: Shirline Frees, MD  Patient coming from: Home.  Chief Complaint: Worsening renal function.  HPI: [name]. is a 84 y.o. male with history of CAD status post CABG, ischemic cardiomyopathy status post AICD placement, chronic kidney disease stage IV, diabetes mellitus type 2 and atrial fibrillation on amiodarone was recently increased on his torsemide dose due to worsening CHF.  Patient also was found to have worsening renal function and was referred to nephrology as outpatient.  Over the last couple of days patient has been feeling weak and dizzy prickly on standing.  Still makes urine.  Denies chest pain or shortness of breath.  Did not lose consciousness.  Had gone to cardiology office yesterday and over there patient received 1 L IV fluids and was having labs done which showed worsening renal function creatinine increased from 3.752 weeks ago and is around 5.1 and was referred to the ER.  ED Course: In the ER patient was not in distress not hypoxic Covid test was negative chest x-ray unremarkable UA was largely unremarkable except for 1430.  Patient was started on IV fluids for acute on chronic kidney disease admitted for further management.  Rest of the labs show a stable hemoglobin.  Patient was hypoglycemic in the ER was given D50.  Review of Systems: As per HPI, rest all negative.   Past Medical History:  Diagnosis Date  . ANEMIA, HX OF   . Bronchial pneumonia Jan. 2016  . CAD, ARTERY BYPASS GRAFT   . CAROTID ARTERY STENOSIS   . Cataract   . DIABETES MELLITUS, TYPE II   . Diabetic retinal damage of right eye ([place]) 2015  . Diverticulosis   . HYPERCHOLESTEROLEMIA   . HYPERTENSION   . Myocardial infarction [facility]) June 2007  . New onset atrial fibrillation ([place]) 06/24/2014  . PULMONARY HYPERTENSION   . RENAL INSUFFICIENCY, CHRONIC   . UNSPECIFIED THROMBOCYTOPENIA     Past  Surgical History:  Procedure Laterality Date  . CARDIAC CATHETERIZATION  06/24/2014  . CARDIOVERSION [name] 05/11/2019   Procedure: CARDIOVERSION;  Surgeon: Larey Dresser, MD;  Location: [facility] ENDOSCOPY;  Service: Cardiovascular;  Laterality: [name];  . CATARACT EXTRACTION  2012   right eye  . COLONOSCOPY    . CORONARY ARTERY BYPASS GRAFT      quad bypass/2007  . EYE SURGERY    . ICD IMPLANT [name] 06/11/2017   Procedure: ICD IMPLANT;  Surgeon: Constance Haw, MD;  Location: [place] CV LAB;  Service: Cardiovascular;  Laterality: [name];  . IR THORACENTESIS ASP PLEURAL SPACE W/IMG GUIDE  10/28/2018  . LEFT HEART CATHETERIZATION WITH CORONARY/GRAFT ANGIOGRAM [name] 06/24/2014   Procedure: LEFT HEART CATHETERIZATION WITH Beatrix Fetters;  Surgeon: Burnell Blanks, MD;  Location: [facility] CATH LAB;  Service: Cardiovascular;  Laterality: [name];  . POLYPECTOMY    . RIGHT HEART CATH [name] 05/22/2019   Procedure: RIGHT HEART CATH;  Surgeon: Larey Dresser, MD;  Location: [place] CV LAB;  Service: Cardiovascular;  Laterality: [name];  . TONSILLECTOMY       reports that he quit smoking about 49 years ago. His smoking use included cigarettes. He has never used smokeless tobacco. He reports current alcohol use of about 1.0 standard drinks of alcohol per week. He reports that he does not use drugs.  No Known Allergies  Family History  Problem Relation Age of Onset  . Heart disease  Father 54       Heart disease before age 19  . Heart attack Father   . Hypertension Father   . Hyperlipidemia Father   . Varicose Veins Father   . Stroke Father        ? not sure  . Colon cancer Maternal Aunt   . Colon cancer Maternal Uncle   . Diverticulitis Other   . Heart attack Other     Prior to Admission medications   Medication Sig Start Date End Date Taking? Authorizing Provider  amiodarone (PACERONE) 200 MG tablet Take 1 tablet (200 mg total) by mouth daily. 05/25/19   Clegg, Amy D, NP  apixaban  (ELIQUIS) 2.5 MG TABS tablet Take 1 tablet (2.5 mg total) by mouth 2 (two) times daily. 03/31/19   Burnell Blanks, MD  calcium-vitamin D (OSCAL WITH D) 500-200 MG-UNIT per tablet Take 1 tablet by mouth daily.     [provider]  Cholecalciferol (VITAMIN D3) 2000 UNITS TABS Take 2,000 Units by mouth daily. 50 mcg    [provider]  Coenzyme Q10 (CO Q 10) 100 MG CAPS Take 300 mg by mouth daily.     [provider]  fish oil-omega-3 fatty acids 1000 MG capsule Take 1 g by mouth daily.      [provider]  gabapentin (NEURONTIN) 300 MG capsule Take 300 mg by mouth 2 (two) times daily as needed (Nerve pain).  02/09/19   [provider]  glipiZIDE (GLUCOTROL) 10 MG tablet Take 10 mg by mouth 2 (two) times daily before a meal.    [provider]  metFORMIN (GLUCOPHAGE) 1000 MG tablet Take 1,000 mg by mouth 2 (two) times daily. 07/08/19   [provider]  metoprolol succinate (TOPROL-XL) 25 MG 24 hr tablet Take 0.5 tablets (12.5 mg total) by mouth daily. 07/15/19   Lyda Jester M, PA-C  Multiple Vitamin (MULTI-DAY VITAMINS PO) Take 1 tablet by mouth daily.     [provider]  omeprazole (PRILOSEC) 20 MG capsule Take 20 mg by mouth 2 (two) times daily before a meal.  11/15/10   [provider]  simvastatin (ZOCOR) 20 MG tablet Take 20 mg by mouth at bedtime.  10/12/10   [provider]  torsemide (DEMADEX) 20 MG tablet Take 2 tablets (40 mg total) by mouth every morning AND 1 tablet (20 mg total) every evening. 08/13/19   Clegg, Amy D, NP  Turmeric 450 MG CAPS Take 450 mg by mouth daily.     [provider]  vitamin C (ASCORBIC ACID) 250 MG tablet Take 250 mg by mouth daily.     [provider]  vitamin E 1000 UNIT capsule Take 1,000 Units by mouth every other day.    [provider]    Physical Exam: Constitutional: Moderately built and nourished. Vitals:   08/14/19 2015  08/14/19 2030 08/14/19 2043 08/14/19 2126  BP: (!) 142/80 (!) 148/72  (!) 141/73  Pulse: 69 67  68  Resp: 12 17  16   Temp:   97.8 F (36.6 C) 97.8 F (36.6 C)  TempSrc:   Oral Oral  SpO2: 100% 100%  100%  Weight:      Height:       Eyes: Anicteric no pallor. ENMT: No discharge from the ears eyes nose or mouth. Neck: No mass felt.  No neck rigidity. Respiratory: No rhonchi or crepitations. Cardiovascular: S1-S2 heard. Abdomen: Soft nontender bowel sounds present. Musculoskeletal: No edema. Skin:  No rash. Neurologic: Alert awake oriented to time place and person.  Moves all extremities. Psychiatric: Appears normal.  Normal affect.   Labs on Admission: I have personally reviewed following labs and imaging studies  CBC: Recent Labs  Lab 08/13/19 1044 08/14/19 1401  WBC 9.2 8.8  HGB 10.9* 10.6*  HCT 33.2* 32.8*  MCV 93.8 92.9  PLT 257 962   Basic Metabolic Panel: Recent Labs  Lab 08/13/19 1044 08/14/19 1401  NA 136 133*  K 3.9 4.1  CL 89* 90*  CO2 29 27  GLUCOSE 66* 120*  BUN 111* 105*  CREATININE 5.10* 5.11*  CALCIUM 9.6 8.9   GFR: Estimated Creatinine Clearance: 10.3 mL/min (A) (by C-G formula based on SCr of 5.11 mg/dL (H)). Liver Function Tests: No results for input(s): AST, ALT, ALKPHOS, BILITOT, PROT, ALBUMIN in the last 168 hours. No results for input(s): LIPASE, AMYLASE in the last 168 hours. No results for input(s): AMMONIA in the last 168 hours. Coagulation Profile: No results for input(s): INR, PROTIME in the last 168 hours. Cardiac Enzymes: No results for input(s): CKTOTAL, CKMB, CKMBINDEX, TROPONINI in the last 168 hours. BNP (last 3 results) Recent Labs    10/21/18 1639 03/06/19 0943 03/12/19 1228  PROBNP 6,802* 12,634* 7,736*   HbA1C: No results for input(s): HGBA1C in the last 72 hours. CBG: Recent Labs  Lab 08/14/19 1947 08/14/19 1949 08/14/19 2014 08/14/19 2041 08/14/19 2143  GLUCAP 43* 40* 29* 137* 172*   Lipid  Profile: No results for input(s): CHOL, HDL, LDLCALC, TRIG, CHOLHDL, LDLDIRECT in the last 72 hours. Thyroid Function Tests: No results for input(s): TSH, T4TOTAL, FREET4, T3FREE, THYROIDAB in the last 72 hours. Anemia Panel: No results for input(s): VITAMINB12, FOLATE, FERRITIN, TIBC, IRON, RETICCTPCT in the last 72 hours. Urine analysis:    Component Value Date/Time   COLORURINE YELLOW 08/14/2019 1810   APPEARANCEUR CLEAR 08/14/2019 1810   LABSPEC 1.011 08/14/2019 1810   PHURINE 7.0 08/14/2019 1810   GLUCOSEU NEGATIVE 08/14/2019 1810   HGBUR NEGATIVE 08/14/2019 1810   [place] 08/14/2019 1810   KETONESUR NEGATIVE 08/14/2019 1810   PROTEINUR 30 (A) 08/14/2019 1810   NITRITE NEGATIVE 08/14/2019 1810   LEUKOCYTESUR NEGATIVE 08/14/2019 1810   Sepsis Labs: @LABRCNTIP (procalcitonin:4,lacticidven:4) ) Recent Results (from the past 240 hour(s))  SARS Coronavirus 2 by RT PCR (hospital order, performed in [place] hospital lab) Nasopharyngeal Nasopharyngeal Swab     Status: None   Collection Time: 08/14/19  7:45 PM   Specimen: Nasopharyngeal Swab  Result Value Ref Range Status   SARS Coronavirus 2 NEGATIVE NEGATIVE Final    Comment: (NOTE) SARS-CoV-2 target nucleic acids are NOT DETECTED. The SARS-CoV-2 RNA is generally detectable in upper and lower respiratory specimens during the acute phase of infection. The lowest concentration of SARS-CoV-2 viral copies this assay can detect is 250 copies / mL. A negative result does not preclude SARS-CoV-2 infection and should not be used as the sole basis for treatment or other patient management decisions.  A negative result may occur with improper specimen collection / handling, submission of specimen other than nasopharyngeal swab, presence of viral mutation(s) within the areas targeted by this assay, and inadequate number of viral copies (<250 copies / mL). A negative result must be combined with clinical observations,  patient history, and epidemiological information. Fact Sheet for Patients:   StrictlyIdeas.no Fact Sheet for Healthcare Providers: BankingDealers.co.za This test is not yet approved or cleared  by the [place] FDA and has been authorized  for detection and/or diagnosis of SARS-CoV-2 by FDA under an Emergency Use Authorization (EUA).  This EUA will remain in effect (meaning this test can be used) for the duration of the COVID-19 declaration under Section 564(b)(1) of the Act, 21 U.S.C. section 360bbb-3(b)(1), unless the authorization is terminated or revoked sooner. Performed at [facility], [place] [address], [place], [place] [address]      Radiological Exams on Admission: DG Chest Portable 1 View  Result Date: 08/14/2019 CLINICAL DATA:  Dizziness EXAM: PORTABLE CHEST 1 VIEW COMPARISON:  05/22/2019 FINDINGS: Cardiac shadow remains enlarged. Defibrillator is again noted and stable. Postsurgical changes are seen. Aortic calcifications are noted. Chronic right-sided pleural effusion and atelectatic changes are noted. No new focal abnormality is seen. IMPRESSION: Chronic changes in the right base.  No acute abnormality noted. Electronically Signed   By: Inez Catalina M.D.   On: 08/14/2019 19:33    EKG: Independently reviewed.  Sinus bradycardia heart rate around minute.  Assessment/Plan Principal Problem:   Acute renal failure (ARF) (HCC) Active Problems:   Type 2 diabetes mellitus with renal manifestations, controlled ([place])   Essential hypertension   CABG x 4 '07, patent grafts 2010, 06/24/14   Pulmonary hypertension-PA 51 mmHg    Cardiomyopathy- new drop in EF- ? secondary to new AF   ARF (acute renal failure) ([place])    1. Acute on chronic kidney disease stage V -will hold off patient's torsemide and gently hydrate.  Check renal ultrasound will need nephrology consult in the morning.  Closely follow intake output and metabolic  panel. 2. Hyperglycemia with history of diabetes mellitus in the setting of taking glipizide with worsening renal function.  Will hold off antidiabetic medications probably has to be discontinued.  Closely follow CBGs. 3. History of A. fib on amiodarone heart rate is around 50 bpm.  Closely monitoring telemetry.  Patient is on Eliquis.  If creatinine further worsen may have to change Eliquis to heparin/Coumadin. 4. CAD status post CABG denies any chest pain. 5. Anemia likely from renal disease follow CBC. 6. Ischemic cardiomyopathy presently receiving fluids due to renal failure.  Given the markedly worsening renal function will need close monitoring for any further deterioration in inpatient status.   DVT prophylaxis: Apixaban. Code Status: Full code. Family Communication: Discussed with patient. Disposition Plan: Home. Consults called: None. Admission status: Inpatient.   Rise Patience MD [facility] Pager [phone].  If 7PM-7AM, please contact night-coverage [email or web address] Password [email or web address]  08/14/2019, 11:17 PM

## 2019-08-14 NOTE — ED Triage Notes (Signed)
Pt arrives to ED with care taker who reports pt has been on torsemide for CHF and they feel he is now dehydration and for the last 2 days has felt light headed and weak. Pt is alert and ox4. No neuro sx

## 2019-08-14 NOTE — Telephone Encounter (Signed)
Referral form, last two office notes, labs, and ekg faxed to [place] [facility] yesterday and again this morning. Original referral faxed 4.26. Traci called this morning and said she would try to have patient seen as soon as possible but they work on a rating system. Depending on what the provider ranks patient determines the urgency of appt. Traci spoke with pts daughter she is aware.

## 2019-08-15 ENCOUNTER — Inpatient Hospital Stay (HOSPITAL_COMMUNITY): Payer: PPO

## 2019-08-15 ENCOUNTER — Encounter (HOSPITAL_COMMUNITY): Payer: Self-pay | Admitting: Internal Medicine

## 2019-08-15 DIAGNOSIS — I255 Ischemic cardiomyopathy: Secondary | ICD-10-CM

## 2019-08-15 LAB — CBC WITH DIFFERENTIAL/PLATELET
Abs Immature Granulocytes: 0.02 10*3/uL (ref 0.00–0.07)
Basophils Absolute: 0 10*3/uL (ref 0.0–0.1)
Basophils Relative: 1 %
Eosinophils Absolute: 0.3 10*3/uL (ref 0.0–0.5)
Eosinophils Relative: 4 %
HCT: 30 % — ABNORMAL LOW (ref 39.0–52.0)
Hemoglobin: 9.7 g/dL — ABNORMAL LOW (ref 13.0–17.0)
Immature Granulocytes: 0 %
Lymphocytes Relative: 7 %
Lymphs Abs: 0.4 10*3/uL — ABNORMAL LOW (ref 0.7–4.0)
MCH: 30.1 pg (ref 26.0–34.0)
MCHC: 32.3 g/dL (ref 30.0–36.0)
MCV: 93.2 fL (ref 80.0–100.0)
Monocytes Absolute: 0.6 10*3/uL (ref 0.1–1.0)
Monocytes Relative: 9 %
Neutro Abs: 4.8 10*3/uL (ref 1.7–7.7)
Neutrophils Relative %: 79 %
Platelets: 208 10*3/uL (ref 150–400)
RBC: 3.22 MIL/uL — ABNORMAL LOW (ref 4.22–5.81)
RDW: 18.1 % — ABNORMAL HIGH (ref 11.5–15.5)
WBC: 6.1 10*3/uL (ref 4.0–10.5)
nRBC: 0 % (ref 0.0–0.2)

## 2019-08-15 LAB — COMPREHENSIVE METABOLIC PANEL
ALT: 17 U/L (ref 0–44)
AST: 21 U/L (ref 15–41)
Albumin: 2.8 g/dL — ABNORMAL LOW (ref 3.5–5.0)
Alkaline Phosphatase: 39 U/L (ref 38–126)
Anion gap: 13 (ref 5–15)
BUN: 94 mg/dL — ABNORMAL HIGH (ref 8–23)
CO2: 30 mmol/L (ref 22–32)
Calcium: 8.8 mg/dL — ABNORMAL LOW (ref 8.9–10.3)
Chloride: 95 mmol/L — ABNORMAL LOW (ref 98–111)
Creatinine, Ser: 4.57 mg/dL — ABNORMAL HIGH (ref 0.61–1.24)
GFR calc Af Amer: 13 mL/min — ABNORMAL LOW (ref >60)
GFR calc non Af Amer: 11 mL/min — ABNORMAL LOW (ref >60)
Glucose, Bld: 118 mg/dL — ABNORMAL HIGH (ref 70–99)
Potassium: 4.1 mmol/L (ref 3.5–5.1)
Sodium: 138 mmol/L (ref 135–145)
Total Bilirubin: 1.1 mg/dL (ref 0.3–1.2)
Total Protein: 6 g/dL — ABNORMAL LOW (ref 6.5–8.1)

## 2019-08-15 LAB — GLUCOSE, CAPILLARY
Glucose-Capillary: 118 mg/dL — ABNORMAL HIGH (ref 70–99)
Glucose-Capillary: 134 mg/dL — ABNORMAL HIGH (ref 70–99)
Glucose-Capillary: 177 mg/dL — ABNORMAL HIGH (ref 70–99)
Glucose-Capillary: 183 mg/dL — ABNORMAL HIGH (ref 70–99)
Glucose-Capillary: 66 mg/dL — ABNORMAL LOW (ref 70–99)
Glucose-Capillary: 70 mg/dL (ref 70–99)
Glucose-Capillary: 87 mg/dL (ref 70–99)

## 2019-08-15 MED ORDER — RENA-VITE PO TABS
1.0000 | ORAL_TABLET | Freq: Every day | ORAL | Status: DC
  Administered 2019-08-15: 1 via ORAL
  Filled 2019-08-15: qty 1

## 2019-08-15 MED ORDER — ENSURE ENLIVE PO LIQD
237.0000 mL | Freq: Two times a day (BID) | ORAL | Status: DC
  Administered 2019-08-15 – 2019-08-16 (×2): 237 mL via ORAL

## 2019-08-15 NOTE — Discharge Instructions (Signed)

## 2019-08-15 NOTE — Consult Note (Signed)
Renal Service Consult Note [facility]  [name] 08/15/2019 [name] Requesting Physician:  Dr Barth Kirks  Reason for Consult:  Renal failure HPI: The patient is a 84 y.o. year-old w/ hx of pHTN, atrial fib, MI, HTN, HL, DM2 w/ complications, CAD sp CABG, PNA, anemia, ICD implant presented on 5/21 to ED w/ feeling lightheaded and weak, possibly dehydrated per pt's caretaker. Pt was recently had his diuretic dose increased due to worsening CHF. No SOB or CP. Went to cardiology office and rec'd 1 liter IVF's and labs showed worsening creat at 5.1 up from 3.75 a few wks ago. Pt was sent to hospital for admission. Creat here was 5.10 as well and today is down to 4.57 w/ IVF's. Making urine.  Asked to see for renal failure.   Pt seen in room. No SOB, swelling. Lightheadedness is improving , still very thirsty/ dry mouth. No abd pain, no n/v/d. No voidign issues.   +hx of diab retinopathy treated w/ injections in R eye  Pt grew up in [place]/ [place] area, has 2 grown children and 2 grown grandchildren.  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   ROS  denies CP  no joint pain   no HA  no blurry vision  no rash  no diarrhea  no nausea/ vomiting  no dysuria  no difficulty voiding  no change in urine color    Past Medical History  Past Medical History:  Diagnosis Date  . ANEMIA, HX OF   . Bronchial pneumonia Jan. 2016  . CAD, ARTERY BYPASS GRAFT   . CAROTID ARTERY STENOSIS   . Cataract   . DIABETES MELLITUS, TYPE II   . Diabetic retinal damage of right eye ([place]) 2015  . Diverticulosis   . HYPERCHOLESTEROLEMIA   . HYPERTENSION   . Myocardial infarction [facility]) June 2007  . New onset atrial fibrillation ([employer]) 06/24/2014  . PULMONARY HYPERTENSION   . RENAL INSUFFICIENCY, CHRONIC   . UNSPECIFIED THROMBOCYTOPENIA    Past Surgical History  Past Surgical History:  Procedure Laterality Date  . CARDIAC CATHETERIZATION  06/24/2014  . CARDIOVERSION [name]  05/11/2019   Procedure: CARDIOVERSION;  Surgeon: Larey Dresser, MD;  [facility] ENDOSCOPY;  Service: Cardiovascular;  Laterality: [name];  . CATARACT EXTRACTION  2012   right eye  . COLONOSCOPY    . CORONARY ARTERY BYPASS GRAFT      quad bypass/2007  . EYE SURGERY    . ICD IMPLANT [name] 06/11/2017   Procedure: ICD IMPLANT;  Surgeon: Constance Haw, MD;  Location: [place] CV LAB;  Service: Cardiovascular;  Laterality: [name];  . IR THORACENTESIS ASP PLEURAL SPACE W/IMG GUIDE  10/28/2018  . LEFT HEART CATHETERIZATION WITH CORONARY/GRAFT ANGIOGRAM [name] 06/24/2014   Procedure: LEFT HEART CATHETERIZATION WITH Beatrix Fetters;  Surgeon: Burnell Blanks, MD;  Location: [facility] CATH LAB;  Service: Cardiovascular;  Laterality: [name];  . POLYPECTOMY    . RIGHT HEART CATH [name] 05/22/2019   Procedure: RIGHT HEART CATH;  Surgeon: Larey Dresser, MD;  Location: [place] CV LAB;  Service: Cardiovascular;  Laterality: [name];  . TONSILLECTOMY     Family History  Family History  Problem Relation Age of Onset  . Heart disease Father 41       Heart disease before age 24  . Heart attack Father   . Hypertension Father   . Hyperlipidemia Father   . Varicose Veins Father   . Stroke Father        ?  not sure  . Colon cancer Maternal Aunt   . Colon cancer Maternal Uncle   . Diverticulitis Other   . Heart attack Other    Social History  reports that he quit smoking about 49 years ago. His smoking use included cigarettes. He has never used smokeless tobacco. He reports current alcohol use of about 1.0 standard drinks of alcohol per week. He reports that he does not use drugs. Allergies No Known Allergies Home medications Prior to Admission medications   Medication Sig Start Date End Date Taking? Authorizing Provider  amiodarone (PACERONE) 200 MG tablet Take 1 tablet (200 mg total) by mouth daily. 05/25/19  Yes Clegg, Amy D, NP  apixaban (ELIQUIS) 2.5 MG TABS tablet Take 1 tablet (2.5 mg total) by  mouth 2 (two) times daily. 03/31/19  Yes Burnell Blanks, MD  calcium-vitamin D (OSCAL WITH D) 500-200 MG-UNIT per tablet Take 1 tablet by mouth daily.    Yes [provider]  Cholecalciferol (VITAMIN D3) 2000 UNITS TABS Take 2,000 Units by mouth daily. 50 mcg   Yes [provider]  Coenzyme Q10 (CO Q 10) 100 MG CAPS Take 300 mg by mouth daily.    Yes [provider]  fish oil-omega-3 fatty acids 1000 MG capsule Take 1 g by mouth daily.     Yes [provider]  glipiZIDE (GLUCOTROL) 10 MG tablet Take 10 mg by mouth 2 (two) times daily before a meal.   Yes [provider]  metFORMIN (GLUCOPHAGE) 1000 MG tablet Take 1,000 mg by mouth 2 (two) times daily. 07/08/19  Yes [provider]  metoprolol succinate (TOPROL-XL) 25 MG 24 hr tablet Take 0.5 tablets (12.5 mg total) by mouth daily. 07/15/19  Yes Lyda Jester M, PA-C  Multiple Vitamin (MULTI-DAY VITAMINS PO) Take 1 tablet by mouth daily.    Yes [provider]  omeprazole (PRILOSEC) 20 MG capsule Take 20 mg by mouth 2 (two) times daily before a meal.  11/15/10  Yes [provider]  simvastatin (ZOCOR) 20 MG tablet Take 20 mg by mouth at bedtime.  10/12/10  Yes [provider]  torsemide (DEMADEX) 20 MG tablet Take 2 tablets (40 mg total) by mouth every morning AND 1 tablet (20 mg total) every evening. 08/13/19  Yes Clegg, Amy D, NP  Turmeric 450 MG CAPS Take 450 mg by mouth daily.    Yes [provider]  vitamin C (ASCORBIC ACID) 250 MG tablet Take 250 mg by mouth daily.    Yes [provider]  vitamin E 1000 UNIT capsule Take 1,000 Units by mouth every other day.   Yes [provider]     Vitals:   08/15/19 0126 08/15/19 0514 08/15/19 0821 08/15/19 1820  BP: (!) 115/59 121/62 (!) 142/60 (!) 128/59  Pulse: (!) 53 (!) 55 68 65  Resp: _0 Temp: (!) 97.5 F (36.4 C) 97.8 F (36.6 C) 97.7 F (36.5 C) (!) 97.5 F (36.4 C)   TempSrc:      SpO2: 100% 99% 100% 100%  Weight:      Height:       Exam Gen alert, some muscle wasting No rash, cyanosis or gangrene Sclera anicteric, throat clear and dry  No jvd or bruits Chest clear bilat to bases no rales, wheezing or bronchial BS RRR no MRG Abd soft ntnd no mass or ascites +bs GU normal male MS no joint effusions or deformity Ext no LE or UE edema,  no wounds or ulcers  Neuro is alert, Ox 3 , nf    Date   Creat  eGFR  2010- 16  1.1- 1.2   2018- 19  1.22- 1.42 45- 55  2020   1.34- 2.01 30- 45  feb 2021  2.55- 3.72 14- 16 Jun 2019  3.04- 3.83 14- 12 Jul 2019  3.55  15  Jul 29 2019  3.75  14  may 20  5.10  Aug 15 2019  4.57  11   Home meds:  - amio 200/ eliquis 2.5 bid/ toprol xl 12.5/ zocor 20 hs/ demadex 40 am+ 20 pm  - metformin 1gm bid/ glipizide 10 bid  - prilosec 20 bid  - prn's/ vitamins/ supplements   UA 5/21 - negative, prot 30, 0-5 wbc, no rbc  feb 2021 > SPEP/IFE - no M-protein noted  march 2021  > urine IE - no monoclonal protein noted    Renal US 08/15/19 > 9.6 / 11.7 cm  + ^'d echo c/w chron medical dz, no hydro    CXR 5/21 - no acute disease    Last ECHO aug 2020 - LVEF 20- 25%, reduced RV fxn  Assessment/ Plan: 1. Renal failure - acute on CKD IV. Acute component clearly due to overdiuresis and vol depletion. No uremic signs, no indication for RRT. Creat improving w/ IVF"s, agree w/ present management. Chronic kidney failure component has had significant progession w/ decline in eGFR from 50 cc/min in 2019 to eGFR 15 cc/min last month. UA 30 prot o/w neg. Renal US w/ chronic changes. Suspect combination DM/ HTN + more recently cardiorenal effects from decline in cardiac function. Will need renal f/u after dc. Agree w/ current management. Will follow.  2. Ischemic CM - EF 20-25% last echo, f/b CHF team 3. DM2 w/ retinopathy 4. HTN 5. Atrial fib 6. CAD sp CABG 2007 7. Anemia of ckd - most likely, Hb 10, follow      Kelly Splinter   MD 08/15/2019, 6:47 PM  Recent Labs  Lab 08/14/19 1401 08/15/19 0337  WBC 8.8 6.1  HGB 10.6* 9.7*   Recent Labs  Lab 08/14/19 1401 08/15/19 0337  K 4.1 4.1  BUN 105* 94*  CREATININE 5.11* 4.57*  CALCIUM 8.9 8.8*

## 2019-08-15 NOTE — Plan of Care (Signed)
  Problem: Education: Goal: Knowledge of General Education information will improve Description: Including pain rating scale, medication(s)/side effects and non-pharmacologic comfort measures Outcome: Progressing   Problem: Education: Goal: Knowledge of disease and its progression will improve Outcome: Progressing

## 2019-08-15 NOTE — Progress Notes (Signed)
New Admission Note: ? Arrival Method: Stretcher Mental Orientation: Alert and Oriented x4 Telemetry: Box [id number] Assessment: Completed Skin: Refer to flowsheet IV: Left Wrist  Pain: 0/10 Tubes: None Safety Measures: Safety Fall Prevention Plan discussed with patient. Admission: Completed 5 [place] Orientation: Patient has been orientated to the room, unit and the staff. Family: None Orders have been reviewed and are being implemented. Will continue to monitor the patient. Call light has been placed within reach and bed alarm has been activated.  ? Milagros Loll, RN  Phone Number: [phone]

## 2019-08-15 NOTE — Progress Notes (Addendum)
PROGRESS NOTE    [name].  AJO:[id number] DOB: 08-05-1935 DOA: 08/14/2019 PCP: Shirline Frees, MD    Chief Complaint  Patient presents with  . Dizziness    Brief Narrative:  [name]. is a 84 y.o. male with history of CAD status post CABG, ischemic cardiomyopathy status post AICD placement, chronic kidney disease stage IV, diabetes mellitus type 2 and atrial fibrillation on amiodarone was recently increased on his torsemide dose due to worsening CHF.  Patient also was found to have worsening renal function and was referred to nephrology as outpatient.  Over the last couple of days patient has been feeling weak and dizzy on standing.  He says a few days ago he had runny stools which has now resolved. Still makes urine.  Denies chest pain or shortness of breath.  Did not lose consciousness.  Had gone to cardiology office and over there patient received 1 L IV fluids and was having labs done which showed worsening renal function creatinine increased from 3.752 weeks ago to 5.1 and was referred to the ER.  ED Course: In the ER patient was not in distress not hypoxic Covid test was negative chest x-ray unremarkable UA was largely unremarkable.  Patient was started on IV fluids for acute on chronic kidney disease admitted for further management.  Rest of the labs show stable hemoglobin.  Patient was hypoglycemic in the ER was given D50.   Assessment & Plan:   Principal Problem:   Acute renal failure (ARF) (HCC) Active Problems:   Type 2 diabetes mellitus with renal manifestations, controlled ([place])   Essential hypertension   CABG x 4 '07, patent grafts 2010, 06/24/14   Pulmonary hypertension-PA 51 mmHg    Cardiomyopathy- new drop in EF- ? secondary to new AF   ARF (acute renal failure) ([place])   1. Acute on chronic kidney disease stage V - Holding torsemide.  Continue IV hydration. Check renal ultrasound.  Consulted nephrology.  Closely follow BUN/creatinine, intake  output.  Avoid nephrotoxic medications 2. Hypoglycemia with history of diabetes mellitus in the setting of taking glipizide with worsening renal function.  He was hypoglycemic in the ED.  Holding oral hypoglycemics in the setting of worsening renal failure.  Closely follow CBGs. 3. History of A. fib on amiodarone heart rate is around 50 bpm.  Monitor on telemetry.  Patient is on Eliquis.  If creatinine further worsen may have to change Eliquis to heparin/Coumadin. 4. CAD status post CABG denies any chest pain. 5. Anemia likely from renal disease.  Follow CBC. 6. Ischemic cardiomyopathy: presently receiving fluids due to renal failure.  DVT prophylaxis: Apixaban. Code Status: Full code. Family Communication: Discussed with patient.  No family at bedside. Disposition Plan: Home. Consults called:  Nephrology Admission status: Inpatient.  Dispo: The patient is from: Home              Anticipated d/c is to: Home              Anticipated d/c date is: 2 days              Patient currently is not medically stable to d/c.   Subjective: He is complaining of some dizziness.  Denies having any nausea, vomiting, diarrhea.  Denies having any chest pain or shortness of breath.  Objective: Vitals:   08/14/19 2126 08/15/19 0126 08/15/19 0514 08/15/19 0821  BP: (!) 141/73 (!) 115/59 121/62 (!) 142/60  Pulse: 68 (!) 53 (!) 55 68  Resp: 16 18 18 18   Temp: 97.8 F (36.6 C) (!) 97.5 F (36.4 C) 97.8 F (36.6 C) 97.7 F (36.5 C)  TempSrc: Oral     SpO2: 100% 100% 99% 100%  Weight:      Height:        Intake/Output Summary (Last 24 hours) at 08/15/2019 1553 Last data filed at 08/15/2019 1000 Gross per 24 hour  Intake 1816.53 ml  Output 1450 ml  Net 366.53 ml   Filed Weights   08/14/19 1348  Weight: 64.4 kg    Examination:  General exam: Appears calm and comfortable  Respiratory system: Clear to auscultation. Respiratory effort normal. Cardiovascular system: S1 & S2 heard, RRR. No  murmur. No pedal edema. Gastrointestinal system: Abdomen is nondistended, soft and nontender. No masses felt. Normal bowel sounds heard. Central nervous system: Alert and oriented. No focal neurological deficits. Extremities: Symmetric 5 x 5 power. Skin: No rashes, lesions or ulcers Psychiatry: Judgement and insight appear normal. Mood & affect appropriate.     Data Reviewed: I have personally reviewed following labs and imaging studies  CBC: Recent Labs  Lab 08/13/19 1044 08/14/19 1401 08/15/19 0337  WBC 9.2 8.8 6.1  NEUTROABS  --   --  4.8  HGB 10.9* 10.6* 9.7*  HCT 33.2* 32.8* 30.0*  MCV 93.8 92.9 93.2  PLT 257 231 109    Basic Metabolic Panel: Recent Labs  Lab 08/13/19 1044 08/14/19 1401 08/15/19 0337  NA 136 133* 138  K 3.9 4.1 4.1  CL 89* 90* 95*  CO2 29 27 30   GLUCOSE 66* 120* 118*  BUN 111* 105* 94*  CREATININE 5.10* 5.11* 4.57*  CALCIUM 9.6 8.9 8.8*    GFR: Estimated Creatinine Clearance: 11.5 mL/min (A) (by C-G formula based on SCr of 4.57 mg/dL (H)).  Liver Function Tests: Recent Labs  Lab 08/15/19 0337  AST 21  ALT 17  ALKPHOS 39  BILITOT 1.1  PROT 6.0*  ALBUMIN 2.8*    CBG: Recent Labs  Lab 08/15/19 0039 08/15/19 0514 08/15/19 0719 08/15/19 0806 08/15/19 1119  GLUCAP 183* 70 66* 134* 87     Recent Results (from the past 240 hour(s))  SARS Coronavirus 2 by RT PCR (hospital order, performed in [facility] hospital lab) Nasopharyngeal Nasopharyngeal Swab     Status: None   Collection Time: 08/14/19  7:45 PM   Specimen: Nasopharyngeal Swab  Result Value Ref Range Status   SARS Coronavirus 2 NEGATIVE NEGATIVE Final    Comment: (NOTE) SARS-CoV-2 target nucleic acids are NOT DETECTED. The SARS-CoV-2 RNA is generally detectable in upper and lower respiratory specimens during the acute phase of infection. The lowest concentration of SARS-CoV-2 viral copies this assay can detect is 250 copies / mL. A negative result does not preclude  SARS-CoV-2 infection and should not be used as the sole basis for treatment or other patient management decisions.  A negative result may occur with improper specimen collection / handling, submission of specimen other than nasopharyngeal swab, presence of viral mutation(s) within the areas targeted by this assay, and inadequate number of viral copies (<250 copies / mL). A negative result must be combined with clinical observations, patient history, and epidemiological information. Fact Sheet for Patients:   StrictlyIdeas.no Fact Sheet for Healthcare Providers: BankingDealers.co.za This test is not yet approved or cleared  by the [place] FDA and has been authorized for detection and/or diagnosis of SARS-CoV-2 by FDA under an Emergency Use Authorization (EUA).  This EUA will remain  in effect (meaning this test can be used) for the duration of the COVID-19 declaration under Section 564(b)(1) of the Act, 21 U.S.C. section 360bbb-3(b)(1), unless the authorization is terminated or revoked sooner. Performed at [facility], [place] [address], [place], [place] [address]          Radiology Studies: US RENAL  Result Date: 08/15/2019 CLINICAL DATA:  Acute renal failure EXAM: RENAL / URINARY TRACT ULTRASOUND COMPLETE COMPARISON:  None. FINDINGS: Right Kidney: Renal measurements: 9.6 x 5.2 x 4.8 cm = volume: 125.3 mL. Increased cortical echogenicity. No hydronephrosis. Left Kidney: Renal measurements: 11.7 x 5.3 x 5.5 cm = volume: 179.7 mL. Increased echogenicity. 1.6 cm cyst. No hydronephrosis. Bladder: Thickened irregular bladder wall. There is a somewhat nodular appearance to the posterior bladder wall as seen on image 25. Other: Right pleural effusion. IMPRESSION: 1. The kidneys demonstrate increased cortical echogenicity consistent with medical renal disease. No hydronephrosis. 2. 1.6 cm cyst in the left kidney. 3. Thickened irregular  bladder wall. A bladder wall nodule/mass is not excluded posteriorly as seen on image 25. Recanalization could better evaluate this region. Electronically Signed   By: Dorise Bullion III M.D   On: 08/15/2019 12:08   DG Chest Portable 1 View  Result Date: 08/14/2019 CLINICAL DATA:  Dizziness EXAM: PORTABLE CHEST 1 VIEW COMPARISON:  05/22/2019 FINDINGS: Cardiac shadow remains enlarged. Defibrillator is again noted and stable. Postsurgical changes are seen. Aortic calcifications are noted. Chronic right-sided pleural effusion and atelectatic changes are noted. No new focal abnormality is seen. IMPRESSION: Chronic changes in the right base.  No acute abnormality noted. Electronically Signed   By: Inez Catalina M.D.   On: 08/14/2019 19:33        Scheduled Meds: . amiodarone  200 mg Oral Daily  . apixaban  2.5 mg Oral BID  . feeding supplement (ENSURE ENLIVE)  237 mL Oral BID BM  . metoprolol succinate  12.5 mg Oral Daily  . multivitamin  1 tablet Oral QHS  . simvastatin  20 mg Oral QHS   Continuous Infusions: . sodium chloride 100 mL/hr at 08/15/19 0639     LOS: 1 day    Yaakov Guthrie, MD [facility]   To contact the attending provider between 7A-7P or the covering provider during after hours 7P-7A, please log into the web site [email or web address] and access using universal [place] password for that web site. If you do not have the password, please call the hospital operator.  08/15/2019, 3:53 PM

## 2019-08-15 NOTE — Progress Notes (Signed)
Initial Nutrition Assessment  DOCUMENTATION CODES:   Not applicable  INTERVENTION:   Ensure Enlive po BID, each supplement provides 350 kcal and 20 grams of protein  Recommend liberalizing diet to 2g sodium only; if po intake inadequate on follow-up, recommend Regular diet  Add Rena-Vit daily   NUTRITION DIAGNOSIS:   Inadequate oral intake related to decreased appetite as evidenced by per patient/family report, percent weight loss.  GOAL:   Patient will meet greater than or equal to 90% of their needs  MONITOR:   PO intake, Supplement acceptance, Labs, Weight trends  REASON FOR ASSESSMENT:   Malnutrition Screening Tool    ASSESSMENT:   84 yo male admitted with acute on chronic CKD IV. PMH includes CAD/CABG, ischemic CM s/p AICD, CHF, CKD IV, DM, HTN   RD working remotely.  Attempted to reach pt via telephone but no answer.   Pt ate 100% at breakfast this AM  Current weight 64.4 kg; per weight encounters, appears pt has experienced weight loss over the past several months.   Wt Readings from Last 10 Encounters:  08/14/19 64.4 kg  08/13/19 63.5 kg  07/20/19 73.8 kg  07/13/19 75.5 kg  07/06/19 73.5 kg  06/30/19 70.4 kg  06/03/19 69.6 kg  05/27/19 71.9 kg  05/25/19 72.8 kg  05/20/19 81.2 kg    Noted pt seen by Cardiac Rehab RD on 07/20/2019 and diet education provided.   Noted episode of hypoglycemia this AM. Pt not currently on any insulin/oral DM meds  Lab Results  Component Value Date   HGBA1C 7.6 (H) 05/23/2019     Labs: CBGs 66-183, Creatinine 4.57, BUN 94, potassium 4.1 (wdl) Meds: NS at 100 ml/hr x 24 hours  NUTRITION - FOCUSED PHYSICAL EXAM:  Unable to assess  Diet Order:   Diet Order            Diet 2 gram sodium Room service appropriate? Yes; Fluid consistency: Thin  Diet effective now              EDUCATION NEEDS:   Not appropriate for education at this time  Skin:  Skin Assessment: Reviewed RN Assessment  Last BM:   5/21  Height:   Ht Readings from Last 1 Encounters:  08/14/19 5\' 11"  (1.803 m)    Weight:   Wt Readings from Last 1 Encounters:  08/14/19 64.4 kg    BMI:  Body mass index is 19.8 kg/m.  Estimated Nutritional Needs:   Kcal:  1700-1900 kcals  Protein:  80-90 g  Fluid:  >/= 1.7 L   Kerman Passey MS, RDN, LDN, CNSC RD Pager Number and RD On-Call Pager Number Located in [place]

## 2019-08-16 LAB — GLUCOSE, CAPILLARY
Glucose-Capillary: 101 mg/dL — ABNORMAL HIGH (ref 70–99)
Glucose-Capillary: 103 mg/dL — ABNORMAL HIGH (ref 70–99)
Glucose-Capillary: 139 mg/dL — ABNORMAL HIGH (ref 70–99)
Glucose-Capillary: 183 mg/dL — ABNORMAL HIGH (ref 70–99)

## 2019-08-16 LAB — BASIC METABOLIC PANEL
Anion gap: 10 (ref 5–15)
BUN: 71 mg/dL — ABNORMAL HIGH (ref 8–23)
CO2: 28 mmol/L (ref 22–32)
Calcium: 8.6 mg/dL — ABNORMAL LOW (ref 8.9–10.3)
Chloride: 102 mmol/L (ref 98–111)
Creatinine, Ser: 3.48 mg/dL — ABNORMAL HIGH (ref 0.61–1.24)
GFR calc Af Amer: 18 mL/min — ABNORMAL LOW (ref >60)
GFR calc non Af Amer: 16 mL/min — ABNORMAL LOW (ref >60)
Glucose, Bld: 129 mg/dL — ABNORMAL HIGH (ref 70–99)
Potassium: 4 mmol/L (ref 3.5–5.1)
Sodium: 140 mmol/L (ref 135–145)

## 2019-08-16 NOTE — Progress Notes (Signed)
[facility] Progress Note  Subjective: feels and looks much better, creat 3.5 today   Vitals:   08/15/19 1820 08/15/19 2048 08/16/19 0546 08/16/19 0916  BP: (!) 128/59 128/65 128/72 123/63  Pulse: 65 65 61 61  Resp: 18 16 18 18   Temp: (!) 97.5 F (36.4 C) 98.5 F (36.9 C) 98.1 F (36.7 C) (!) 97.4 F (36.3 C)  TempSrc:  Oral Oral Oral  SpO2: 100% 100% 99% 100%  Weight:  67.9 kg    Height:        Exam: Gen alert, looks much better Sclera anicteric, throat moist today  No jvd or bruits Chest clear bilat  RRR no MRG Abd soft ntnd no mass or ascites +bs Ext no leg or UE edema Neuro is alert, Ox 3 , nf    Date                           Creat               eGFR  2010- 16                     1.1- 1.2              2018- 19                     1.22- 1.42        45- 55  2020                           1.34- 2.01        30- 45  feb 2021                     2.55- 3.72        14- 16 Jun 2019                    3.04- 3.83        14- 12 Jul 2019                     3.55                 15  Jul 29 2019                3.75                 14  may 20                       5.10  Aug 15 2019              4.57                 11   Home meds:  - amio 200/ eliquis 2.5 bid/ toprol xl 12.5/ zocor 20 hs/ demadex 40 am+ 20 pm  - metformin 1gm bid/ glipizide 10 bid  - prilosec 20 bid  - prn's/ vitamins/ supplements   UA 5/21 - negative, prot 30, 0-5 wbc, no rbc  feb 2021 > SPEP/IFE - no M-protein noted  march 2021  > urine IE - no monoclonal protein noted    Renal US 08/15/19 > 9.6 / 11.7 cm  + ^'d echo c/w chron medical dz, no hydro    CXR 5/21 -  no acute disease    Last ECHO aug 2020 - LVEF 20- 25%, reduced RV fxn  Assessment/ Plan: 1. AKI  - due to overdiuresis and vol depletion. Peak creat here 5.10, now improved to baseline 3.5 today w/ IVF"s.  Pt looks much better / hydrated today. OK for dc. Would dc off of diuretics for now and have him f/u w/ cardiology within  1 week.  Will sign off.   2. CKD IV - f/l creat 3.0- 3.5. Renal function has undergone sig decline in eGFR from 50 cc/min in 2019 to eGFR 15 cc/min last month. UA 30 prot o/w neg. Renal US w/ chronic changes. Suspect combination DM/ HTN + more recently cardiorenal effects from decline in cardiac function. We will contact patient at home to arrange f/u in our office.  3. Ischemic CM - EF 20-25% last echo, f/b CHF team 4. DM2 w/ retinopathy 5. HTN 6. Atrial fib 7. CAD sp CABG 2007 8. Anemia of ckd - most likely, Hb 10, follow     Rob Najwa Spillane 08/16/2019, 12:55 PM   Recent Labs  Lab 08/14/19 1401 08/14/19 1401 08/15/19 0337 08/16/19 0405  K 4.1   < > 4.1 4.0  BUN 105*   < > 94* 71*  CREATININE 5.11*   < > 4.57* 3.48*  CALCIUM 8.9   < > 8.8* 8.6*  HGB 10.6*  --  9.7*  --    < > = values in this interval not displayed.   Inpatient medications: . amiodarone  200 mg Oral Daily  . apixaban  2.5 mg Oral BID  . feeding supplement (ENSURE ENLIVE)  237 mL Oral BID BM  . metoprolol succinate  12.5 mg Oral Daily  . multivitamin  1 tablet Oral QHS  . simvastatin  20 mg Oral QHS   . sodium chloride 100 mL/hr at 08/16/19 0230   ondansetron **OR** ondansetron (ZOFRAN) IV

## 2019-08-16 NOTE — Discharge Summary (Signed)
Physician Discharge Summary  [name]. IRJ:[id number] DOB: 06/21/1935 DOA: 08/14/2019  PCP: Shirline Frees, MD  Admit date: 08/14/2019 Discharge date: 08/16/2019  Admitted From: Home Disposition: Home  Recommendations for Outpatient Follow-up:  1. Follow up with PCP in 1-2 weeks 2. Please obtain BMP/CBC in one week   Home Health: No Equipment/Devices:No  Discharge Condition: Stable CODE STATUS: Full Diet recommendation: Heart Healthy / Carb Modified  Brief/Interim Summary: [name] Pittmanis a 84 y.o.malewithhistory of CAD status post CABG, ischemic cardiomyopathy status post AICD placement, chronic kidney disease stage IV, diabetes mellitus type 2 and atrial fibrillation on amiodarone was recently increased on his torsemide dose due to worsening CHF. He was found to have worsening renal function and was referred to nephrology as outpatient. Over the last couple of days patient had been feeling weak and dizzy on standing.  He says a few days ago he had runny stools which has now resolved.Still makes urine. Had gone to cardiology office and over there patient received 1 L IV fluids and was having labs done which showed worsening renal function creatinine increased from 3.752 weeks ago to 5.1 and was referred to the ER.  ED Course:In the ER patient was not in distress not hypoxic. Covid test was negative. chest x-ray unremarkable. UA was largely unremarkable. Patient was started on IV fluids for acute on chronic kidney disease admitted for further management. Stable hemoglobin. Patient was hypoglycemic in the ER was given D50.  Following is hospital course in the problem list format:  1. Acute on chronic kidney disease stage V- torsemide was held.   He was given IV hydration. Renal ultrasound showing findings consistent with medical renal disease, thickened irregular bladder wall. Consulted nephrology.  His creatinine at the time of admission was 5.11.  Now improved  to 3.48.  He was seen by nephrology and okay to be discharged home.  Will discontinue the torsemide.  He will follow up with nephrology clinic outpatient. 2. Hypoglycemia: Diabetes mellitus in the setting of taking glipizide with worsening renal function. He was hypoglycemic in the ED.  Holding oral hypoglycemics in the setting of worsening renal failure. His blood glucose here has been normal.  Due to hypoglycemia holding the oral hypoglycemics.  Advised to monitor his blood glucose and if high at home he can restart his medications. Advised to follow-up with his primary care physician in regard to his diabetes. 3. History of A. Fib: Continue amiodarone, Eliquis.  Advised to follow-up with his cardiologist outpatient. 4. CAD status post CABG denies any chest pain.  Continue home medications. 5. Anemia likely from renal disease.    Hemoglobin stable. 6. Ischemic cardiomyopathy:  Stable at this time.  Continue home medications.  Discharge Diagnoses:  Principal Problem:   Acute renal failure (ARF) ([place]) Active Problems:   Type 2 diabetes mellitus with renal manifestations, controlled ([place])   Essential hypertension   CABG x 4 '07, patent grafts 2010, 06/24/14   Pulmonary hypertension-PA 51 mmHg    Cardiomyopathy- new drop in EF- ? secondary to new AF   ARF (acute renal failure) [facility])  Discharge Instructions Patient says he feels better and is wanting to be discharged home.  He has been seen by nephrology and they are okay with him being discharged home and follow-up outpatient.  Advised to follow-up with his cardiologist outpatient and primary care physician.  I tried to call his daughter but unable to reach.  Discharge plan of care discussed with the patient and  he verbalized understanding.  Allergies as of 08/16/2019   No Known Allergies     Medication List    STOP taking these medications   glipiZIDE 10 MG tablet Commonly known as: GLUCOTROL   metFORMIN 1000 MG tablet Commonly known  as: GLUCOPHAGE   torsemide 20 MG tablet Commonly known as: DEMADEX     TAKE these medications   amiodarone 200 MG tablet Commonly known as: PACERONE Take 1 tablet (200 mg total) by mouth daily.   apixaban 2.5 MG Tabs tablet Commonly known as: Eliquis Take 1 tablet (2.5 mg total) by mouth 2 (two) times daily.   calcium-vitamin D 500-200 MG-UNIT tablet Commonly known as: OSCAL WITH D Take 1 tablet by mouth daily.   Co Q 10 100 MG Caps Take 300 mg by mouth daily.   fish oil-omega-3 fatty acids 1000 MG capsule Take 1 g by mouth daily.   metoprolol succinate 25 MG 24 hr tablet Commonly known as: TOPROL-XL Take 0.5 tablets (12.5 mg total) by mouth daily.   MULTI-DAY VITAMINS PO Take 1 tablet by mouth daily.   omeprazole 20 MG capsule Commonly known as: PRILOSEC Take 20 mg by mouth 2 (two) times daily before a meal.   simvastatin 20 MG tablet Commonly known as: ZOCOR Take 20 mg by mouth at bedtime.   Turmeric 450 MG Caps Take 450 mg by mouth daily.   vitamin C 250 MG tablet Commonly known as: ASCORBIC ACID Take 250 mg by mouth daily.   Vitamin D3 50 MCG (2000 UT) Tabs Take 2,000 Units by mouth daily. 50 mcg   vitamin E 1000 UNIT capsule Take 1,000 Units by mouth every other day.       No Known Allergies  Consultations:  Nephrology   Procedures/Studies: NM CARDIAC AMYLOID TUMOR LOC INFLAM SPECT 1 DAY  Result Date: 07/29/2019 CLINICAL DATA:  84 year old male. HEART FAILURE. CONCERN FOR CARDIAC AMYLOIDOSIS. EXAM: NUCLEAR MEDICINE TUMOR LOCALIZATION. PYP CARDIAC AMYLOIDOSIS SCAN WITH SPECT TECHNIQUE: Following intravenous administration of radiopharmaceutical, anterior planar images of the chest were obtained. Regions of interest were placed on the heart and contralateral chest wall for quantitative assessment. Additional SPECT imaging of the chest was obtained. RADIOPHARMACEUTICALS:  20.7 mCi TECHNETIUM 99 PYROPHOSPHATE FINDINGS: Planar Visual assessment:  Anterior planar imaging demonstrates radiotracer uptake within the heart less than than uptake within the adjacent ribs (Grade 1). Quantitative assessment : Quantitative assessment of the cardiac uptake compared to the contralateral chest wall is equal to 1.2 (H/CL = 1.2). SPECT assessment: SPECT imaging of the chest demonstrates minimal radiotracer accumulation within the LEFT ventricle. IMPRESSION: Visual and quantitative assessment (grade 1, H/CL equal 1.2) are NOTt suggestive of transthyretin amyloidosis. Electronically Signed   By: Suzy Bouchard M.D.   On: 07/29/2019 16:10   US RENAL  Result Date: 08/15/2019 CLINICAL DATA:  Acute renal failure EXAM: RENAL / URINARY TRACT ULTRASOUND COMPLETE COMPARISON:  None. FINDINGS: Right Kidney: Renal measurements: 9.6 x 5.2 x 4.8 cm = volume: 125.3 mL. Increased cortical echogenicity. No hydronephrosis. Left Kidney: Renal measurements: 11.7 x 5.3 x 5.5 cm = volume: 179.7 mL. Increased echogenicity. 1.6 cm cyst. No hydronephrosis. Bladder: Thickened irregular bladder wall. There is a somewhat nodular appearance to the posterior bladder wall as seen on image 25. Other: Right pleural effusion. IMPRESSION: 1. The kidneys demonstrate increased cortical echogenicity consistent with medical renal disease. No hydronephrosis. 2. 1.6 cm cyst in the left kidney. 3. Thickened irregular bladder wall. A bladder wall nodule/mass is not excluded posteriorly  as seen on image 25. Recanalization could better evaluate this region. Electronically Signed   By: Dorise Bullion III M.D   On: 08/15/2019 12:08   DG Chest Portable 1 View  Result Date: 08/14/2019 CLINICAL DATA:  Dizziness EXAM: PORTABLE CHEST 1 VIEW COMPARISON:  05/22/2019 FINDINGS: Cardiac shadow remains enlarged. Defibrillator is again noted and stable. Postsurgical changes are seen. Aortic calcifications are noted. Chronic right-sided pleural effusion and atelectatic changes are noted. No new focal abnormality is seen.  IMPRESSION: Chronic changes in the right base.  No acute abnormality noted. Electronically Signed   By: Inez Catalina M.D.   On: 08/14/2019 19:33     Subjective: Denies having any complaints at this time.  Discharge Exam: Vitals:   08/16/19 0546 08/16/19 0916  BP: 128/72 123/63  Pulse: 61 61  Resp: 18 18  Temp: 98.1 F (36.7 C) (!) 97.4 F (36.3 C)  SpO2: 99% 100%   Vitals:   08/15/19 1820 08/15/19 2048 08/16/19 0546 08/16/19 0916  BP: (!) 128/59 128/65 128/72 123/63  Pulse: 65 65 61 61  Resp: 18 16 18 18   Temp: (!) 97.5 F (36.4 C) 98.5 F (36.9 C) 98.1 F (36.7 C) (!) 97.4 F (36.3 C)  TempSrc:  Oral Oral Oral  SpO2: 100% 100% 99% 100%  Weight:  67.9 kg    Height:        General: Pt is alert, awake, not in acute distress Cardiovascular: RRR, S1/S2 +, no rubs, no gallops Respiratory: CTA bilaterally, no wheezing, no rhonchi Abdominal: Soft, NT, ND, bowel sounds + Extremities: no edema, no cyanosis    The results of significant diagnostics from this hospitalization (including imaging, microbiology, ancillary and laboratory) are listed below for reference.     Microbiology: Recent Results (from the past 240 hour(s))  SARS Coronavirus 2 by RT PCR (hospital order, performed in [facility] hospital lab) Nasopharyngeal Nasopharyngeal Swab     Status: None   Collection Time: 08/14/19  7:45 PM   Specimen: Nasopharyngeal Swab  Result Value Ref Range Status   SARS Coronavirus 2 NEGATIVE NEGATIVE Final    Comment: (NOTE) SARS-CoV-2 target nucleic acids are NOT DETECTED. The SARS-CoV-2 RNA is generally detectable in upper and lower respiratory specimens during the acute phase of infection. The lowest concentration of SARS-CoV-2 viral copies this assay can detect is 250 copies / mL. A negative result does not preclude SARS-CoV-2 infection and should not be used as the sole basis for treatment or other patient management decisions.  A negative result may occur  with improper specimen collection / handling, submission of specimen other than nasopharyngeal swab, presence of viral mutation(s) within the areas targeted by this assay, and inadequate number of viral copies (<250 copies / mL). A negative result must be combined with clinical observations, patient history, and epidemiological information. Fact Sheet for Patients:   StrictlyIdeas.no Fact Sheet for Healthcare Providers: BankingDealers.co.za This test is not yet approved or cleared  by the [place] FDA and has been authorized for detection and/or diagnosis of SARS-CoV-2 by FDA under an Emergency Use Authorization (EUA).  This EUA will remain in effect (meaning this test can be used) for the duration of the COVID-19 declaration under Section 564(b)(1) of the Act, 21 U.S.C. section 360bbb-3(b)(1), unless the authorization is terminated or revoked sooner. Performed at [facility], [place] [address] [place], [place] [address]      Labs: BNP (last 3 results) Recent Labs    05/22/19 1504  BNP  3,976.7*   Basic Metabolic Panel: Recent Labs  Lab 08/13/19 1044 08/14/19 1401 08/15/19 0337 08/16/19 0405  NA 136 133* 138 140  K 3.9 4.1 4.1 4.0  CL 89* 90* 95* 102  CO2 29 27 30 28   GLUCOSE 66* 120* 118* 129*  BUN 111* 105* 94* 71*  CREATININE 5.10* 5.11* 4.57* 3.48*  CALCIUM 9.6 8.9 8.8* 8.6*   Liver Function Tests: Recent Labs  Lab 08/15/19 0337  AST 21  ALT 17  ALKPHOS 39  BILITOT 1.1  PROT 6.0*  ALBUMIN 2.8*   No results for input(s): LIPASE, AMYLASE in the last 168 hours. No results for input(s): AMMONIA in the last 168 hours. CBC: Recent Labs  Lab 08/13/19 1044 08/14/19 1401 08/15/19 0337  WBC 9.2 8.8 6.1  NEUTROABS  --   --  4.8  HGB 10.9* 10.6* 9.7*  HCT 33.2* 32.8* 30.0*  MCV 93.8 92.9 93.2  PLT 257 231 208   Cardiac Enzymes: No results for input(s): CKTOTAL, CKMB, CKMBINDEX, TROPONINI in the last  168 hours. BNP: Invalid input(s): POCBNP CBG: Recent Labs  Lab 08/15/19 2045 08/16/19 0057 08/16/19 0419 08/16/19 0709 08/16/19 1140  GLUCAP 177* 103* 139* 101* 183*   D-Dimer No results for input(s): DDIMER in the last 72 hours. Hgb A1c No results for input(s): HGBA1C in the last 72 hours. Lipid Profile No results for input(s): CHOL, HDL, LDLCALC, TRIG, CHOLHDL, LDLDIRECT in the last 72 hours. Thyroid function studies No results for input(s): TSH, T4TOTAL, T3FREE, THYROIDAB in the last 72 hours.  Invalid input(s): FREET3 Anemia work up No results for input(s): VITAMINB12, FOLATE, FERRITIN, TIBC, IRON, RETICCTPCT in the last 72 hours. Urinalysis    Component Value Date/Time   COLORURINE YELLOW 08/14/2019 1810   APPEARANCEUR CLEAR 08/14/2019 1810   LABSPEC 1.011 08/14/2019 1810   PHURINE 7.0 08/14/2019 1810   GLUCOSEU NEGATIVE 08/14/2019 1810   HGBUR NEGATIVE 08/14/2019 1810   BILIRUBINUR NEGATIVE 08/14/2019 1810   KETONESUR NEGATIVE 08/14/2019 1810   PROTEINUR 30 (A) 08/14/2019 1810   NITRITE NEGATIVE 08/14/2019 1810   LEUKOCYTESUR NEGATIVE 08/14/2019 1810   Sepsis Labs Invalid input(s): PROCALCITONIN,  WBC,  LACTICIDVEN Microbiology Recent Results (from the past 240 hour(s))  SARS Coronavirus 2 by RT PCR (hospital order, performed in [facility] hospital lab) Nasopharyngeal Nasopharyngeal Swab     Status: None   Collection Time: 08/14/19  7:45 PM   Specimen: Nasopharyngeal Swab  Result Value Ref Range Status   SARS Coronavirus 2 NEGATIVE NEGATIVE Final    Comment: (NOTE) SARS-CoV-2 target nucleic acids are NOT DETECTED. The SARS-CoV-2 RNA is generally detectable in upper and lower respiratory specimens during the acute phase of infection. The lowest concentration of SARS-CoV-2 viral copies this assay can detect is 250 copies / mL. A negative result does not preclude SARS-CoV-2 infection and should not be used as the sole basis for treatment or other patient  management decisions.  A negative result may occur with improper specimen collection / handling, submission of specimen other than nasopharyngeal swab, presence of viral mutation(s) within the areas targeted by this assay, and inadequate number of viral copies (<250 copies / mL). A negative result must be combined with clinical observations, patient history, and epidemiological information. Fact Sheet for Patients:   StrictlyIdeas.no Fact Sheet for Healthcare Providers: BankingDealers.co.za This test is not yet approved or cleared  by the [place] FDA and has been authorized for detection and/or diagnosis of SARS-CoV-2 by FDA under an Emergency Use Authorization (  EUA).  This EUA will remain in effect (meaning this test can be used) for the duration of the COVID-19 declaration under Section 564(b)(1) of the Act, 21 U.S.C. section 360bbb-3(b)(1), unless the authorization is terminated or revoked sooner. Performed at [facility], [place] [address], [place], [place] [address]      Time coordinating discharge: Over 30 minutes  SIGNED:   Yaakov Guthrie, MD  [facility] 08/16/2019, 1:53 PM Pager on amion  If 7PM-7AM, please contact night-coverage [email or web address] Password [email or web address]

## 2019-08-16 NOTE — Plan of Care (Signed)
  Problem: Education: Goal: Knowledge of General Education information will improve Description: Including pain rating scale, medication(s)/side effects and non-pharmacologic comfort measures Outcome: Completed/Met   Problem: Clinical Measurements: Goal: Will remain free from infection Outcome: Completed/Met Goal: Respiratory complications will improve Outcome: Completed/Met

## 2019-08-17 ENCOUNTER — Encounter (HOSPITAL_COMMUNITY): Payer: PPO

## 2019-08-17 ENCOUNTER — Telehealth (HOSPITAL_COMMUNITY): Payer: Self-pay | Admitting: *Deleted

## 2019-08-17 NOTE — Progress Notes (Signed)
Also get BMET on Thursday or Friday

## 2019-08-17 NOTE — Telephone Encounter (Signed)
2nd VM left on daughters VM. Left detailed message with medication changes and asked that she return my call so that I know she received and understood my message.

## 2019-08-17 NOTE — Progress Notes (Signed)
Left VM for pt's daughter.  

## 2019-08-17 NOTE — Telephone Encounter (Signed)
pts daughter left vm to inform us that patient was admitted and discharged over the weekend for AKI. She requests a call back concerning pts care. Pt has an appt on Thursday. I routed pts discharge summary to [facility] and asked if any changes needed to be made before appt on Thursday.

## 2019-08-17 NOTE — Progress Notes (Signed)
Pt has pending appt 5/27 in app clinic

## 2019-08-17 NOTE — Progress Notes (Signed)
He should not stop torsemide altogether or he will build fluid back up again.  Needs to restart torsemide 40 qam/20 qpm until he sees me back in office.

## 2019-08-17 NOTE — Telephone Encounter (Signed)
Called pts daughter no answer/left VM  "He should not stop torsemide altogether or he will build fluid back up again.  Needs to restart torsemide 40 qam/20 qpm until he sees me back in office." -Dr.Dalton Aundra Dubin

## 2019-08-19 ENCOUNTER — Encounter (HOSPITAL_COMMUNITY): Payer: PPO

## 2019-08-19 LAB — GLUCOSE, CAPILLARY: Glucose-Capillary: 40 mg/dL — CL (ref 70–99)

## 2019-08-20 ENCOUNTER — Encounter (HOSPITAL_COMMUNITY): Payer: Self-pay

## 2019-08-20 ENCOUNTER — Other Ambulatory Visit: Payer: Self-pay

## 2019-08-20 ENCOUNTER — Ambulatory Visit (HOSPITAL_COMMUNITY)
Admission: RE | Admit: 2019-08-20 | Discharge: 2019-08-20 | Disposition: A | Discharge status: Home / Self Care | Payer: PPO | Source: Ambulatory Visit | Attending: Cardiology | Admitting: Cardiology

## 2019-08-20 VITALS — BP 110/62 | HR 57 | Wt 152.2 lb

## 2019-08-20 DIAGNOSIS — Z79899 Other long term (current) drug therapy: Secondary | ICD-10-CM | POA: Diagnosis not present

## 2019-08-20 DIAGNOSIS — N184 Chronic kidney disease, stage 4 (severe): Secondary | ICD-10-CM | POA: Diagnosis not present

## 2019-08-20 DIAGNOSIS — I5022 Chronic systolic (congestive) heart failure: Secondary | ICD-10-CM | POA: Diagnosis not present

## 2019-08-20 DIAGNOSIS — Z8349 Family history of other endocrine, nutritional and metabolic diseases: Secondary | ICD-10-CM | POA: Diagnosis not present

## 2019-08-20 DIAGNOSIS — I2510 Atherosclerotic heart disease of native coronary artery without angina pectoris: Secondary | ICD-10-CM | POA: Diagnosis not present

## 2019-08-20 DIAGNOSIS — I429 Cardiomyopathy, unspecified: Secondary | ICD-10-CM | POA: Diagnosis not present

## 2019-08-20 DIAGNOSIS — Z8249 Family history of ischemic heart disease and other diseases of the circulatory system: Secondary | ICD-10-CM | POA: Diagnosis not present

## 2019-08-20 DIAGNOSIS — Z95810 Presence of automatic (implantable) cardiac defibrillator: Secondary | ICD-10-CM | POA: Insufficient documentation

## 2019-08-20 DIAGNOSIS — E785 Hyperlipidemia, unspecified: Secondary | ICD-10-CM | POA: Diagnosis not present

## 2019-08-20 DIAGNOSIS — E1122 Type 2 diabetes mellitus with diabetic chronic kidney disease: Secondary | ICD-10-CM | POA: Diagnosis not present

## 2019-08-20 DIAGNOSIS — Z951 Presence of aortocoronary bypass graft: Secondary | ICD-10-CM | POA: Insufficient documentation

## 2019-08-20 DIAGNOSIS — I130 Hypertensive heart and chronic kidney disease with heart failure and stage 1 through stage 4 chronic kidney disease, or unspecified chronic kidney disease: Secondary | ICD-10-CM | POA: Diagnosis not present

## 2019-08-20 DIAGNOSIS — I6529 Occlusion and stenosis of unspecified carotid artery: Secondary | ICD-10-CM | POA: Diagnosis not present

## 2019-08-20 DIAGNOSIS — I255 Ischemic cardiomyopathy: Secondary | ICD-10-CM | POA: Insufficient documentation

## 2019-08-20 DIAGNOSIS — I480 Paroxysmal atrial fibrillation: Secondary | ICD-10-CM | POA: Insufficient documentation

## 2019-08-20 DIAGNOSIS — J90 Pleural effusion, not elsewhere classified: Secondary | ICD-10-CM | POA: Insufficient documentation

## 2019-08-20 DIAGNOSIS — Z87891 Personal history of nicotine dependence: Secondary | ICD-10-CM | POA: Insufficient documentation

## 2019-08-20 DIAGNOSIS — Z7901 Long term (current) use of anticoagulants: Secondary | ICD-10-CM | POA: Diagnosis not present

## 2019-08-20 NOTE — Patient Instructions (Signed)
Labs done today, your results will be available in MyChart, we will contact you for abnormal readings.  You have been referred to Sharman Cheek for monthly device checks, she will be in touch with you regarding this  Your physician recommends that you schedule a follow-up appointment in: 2-3 weeks  If you have any questions or concerns before your next appointment please send [place] a message through [place] or call our office at [phone].    TO SPEAK WITH A NURSE SELECT OPTION 2, PLEASE LEAVE A MESSAGE INCLUDING: . YOUR NAME . DATE OF BIRTH . CALL BACK NUMBER . REASON FOR CALL  YOU WILL RECEIVE A CALL BACK THE SAME DAY AS LONG AS YOU CALL BEFORE 4:00 PM  At the [facility], you and your health needs are our priority. As part of our continuing mission to provide you with exceptional heart care, we have created designated Provider Care Teams. These Care Teams include your primary Cardiologist (physician) and Advanced Practice Providers (APPs- Physician Assistants and Nurse Practitioners) who all work together to provide you with the care you need, when you need it.   You may see any of the following providers on your designated Care Team at your next follow up: [name] Dr Glori Bickers . Dr [name] . Darrick Grinder, NP . Lyda Jester, PA . Audry Riles, PharmD   Please be sure to bring in all your medications bottles to every appointment.

## 2019-08-20 NOTE — Progress Notes (Signed)
PCP: Shirline Frees, MD Cardiology: Dr. Angelena Form HF Cardiology: Dr. Aundra Dubin  84 y.o. with h/o CAD s/p CABG, ischemic cardiomyopathy, and diabetes was referred by Dr. Angelena Form for evaluation of CHF.  Patient had CABG in 2007.  Last cath in 2016 showed all 4 grafts patent.  Echo was 25-30% in 2016.  Most recent echo in 8/20 showed EF 20-25% with moderate RV dysfunction.  He has had a recurrent right pleural effusion with thoracenteses in 5/19 and 8/20.  He thinks that he has been in atrial fibrillation persistently for about a year.  He used to take amiodarone but stopped it because he "felt bad."  However, he does not think that stopping amiodarone made him feel any better.  Creatinine has been elevated, CKD stage 3.   At a prior appt, I thought he was volume overloaded and increased Lasix, but his creatinine steadily rose (2.55 => 3.13 => 3.72). I stopped Entresto and he never started spironolactone.  Lasix was cut back to 60 mg daily. He says that during this time, he got his COVID vaccination and after that, he became severely nauseated for a number of days and ate/drank very little.  This eventually resolved.   On 05/11/19, I cardioverted him back to NSR.    Later in 2/21, he had RHC.  This showed elevated right and left heart filling pressures with low but not markedly low cardiac output.  He was admitted for IV diuresis.  While diuresing, he was maintained on milrinone.  This was weaned prior to discharge. PYP scan done in the hospital was equivocal for ATTR amyloidosis. He was discharged on torsemide.   He was seen by Darrick Grinder in the office earlier this month with significant weight loss.  He was thought to be volume depleted/dehydrated and creatinine was > 5.  He got IV fluid in the clinic but was still lightheaded and weak so was admitted.  He got IV fluid in the hospital and torsemide was stopped.  Weight started to rise, so he was started back on lower dose of torsemide, 40 qam/20 qpm.    He  returns today for followup of CHF.  Last creatinine was 3.48 just prior to leaving the hospital.  This is consistent with prior baseline.  He seems to be doing ok currently, no dyspnea if he walks at a steady pace on flat ground.  No orthopnea/PND.  He is short of breath walking up an incline. Weight is down today compared to last appointment.  No lightheadedness or chest pain.   Labs (12/20): K 4.5, creatinine 2.01 => 1.84  Labs (2/21): K 3.9, creatinine 2.55 => 3.13 => 3.72 Labs (3/21): hgb 11.1, K 3.6 => 4.6, creatinine 3.04 => 3.64, myeloma panel negative, urine immunofixation negative. TSH and LFTs normal.  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 Type 2 diabetes  2. HTN 3. Hyperlipidemia 4. CAD: CABG x 4 in 2007 with LIMA-LAD, SVG-OM, sequential SVG-PLV/PDA.   - LHC (2016): Patent grafts.  5. Atrial fibrillation: Persistent.  - DCCV to NSR 2/21.  6. CKD stage 4.  7. Carotid stenosis:  - Carotid dopplers (8/20): 60-79% RICA stenosis.  8. Chronic systolic CHF: Ischemic cardiomyopathy.    - Echo (2016): EF 25-30% - Echo (2018): EF 25% - Echo (8/20): EF 20-25%, moderately  decreased RV systolic function, severe LAE.  - St Jude ICD.   - RHC (3/21): mean RA 18, PA 71/27, mean PCWP 24, CI 2.16 (thermo), CI 2.2 (Fick), PVR 4.6 WU, PAPi 2.4.  - PYP scan (3/21): grade 1 but H/CL ratio 1.6.  9. Pleural effusion on right: s/p thoracentesis in 5/19 and 8/20.   Social History   Socioeconomic History  . Marital status: Married    Spouse name: Not on file  . Number of children: 2  . Years of education: Not on file  . Highest education level: Not on file  Occupational History  . Occupation: Horticulturist, commercial: RETIRED  Tobacco Use  . Smoking status: Former Smoker    Types:  Cigarettes    Quit date: 03/26/1970    Years since quitting: 49.4  . Smokeless tobacco: Never Used  Substance and Sexual Activity  . Alcohol use: Yes    Alcohol/week: 1.0 standard drinks    Types: 1 Glasses of wine per week  . Drug use: No  . Sexual activity: Not on file  Other Topics Concern  . Not on file  Social History Narrative  . Not on file   Social Determinants of Health   Financial Resource Strain:   . Difficulty of Paying Living Expenses:   Food Insecurity:   . Worried About Charity fundraiser in the Last Year:   . Arboriculturist in the Last Year:   Transportation Needs: No Transportation Needs  . Lack of Transportation (Medical): No  . Lack of Transportation (Non-Medical): No  Physical Activity: Inactive  . Days of Exercise per Week: 0 days  . Minutes of Exercise per Session: 0 min  Stress:   . Feeling of Stress :   Social Connections:   . Frequency of Communication with Friends and Family:   . Frequency of Social Gatherings with Friends and Family:   . Attends Religious Services:   . Active Member of Clubs or Organizations:   . Attends Archivist Meetings:   [name] Marital Status:   Intimate Partner Violence:   . Fear of Current or Ex-Partner:   . Emotionally Abused:   [name] Physically Abused:   . Sexually Abused:    Family History  Problem Relation Age of Onset  . Heart disease Father 85       Heart disease before age 38  . Heart attack Father   . Hypertension Father   . Hyperlipidemia Father   . Varicose Veins Father   . Stroke Father        ? not sure  . Colon cancer Maternal Aunt   . Colon cancer Maternal Uncle   . Diverticulitis Other   . Heart attack Other    ROS: All systems reviewed and negative except as per HPI.   Current Outpatient Medications  Medication Sig Dispense Refill  . amiodarone (PACERONE) 200 MG tablet Take 1 tablet (200 mg total) by mouth daily. 30 tablet 4  . apixaban (ELIQUIS) 2.5 MG TABS tablet Take 1 tablet (2.5 mg  total) by mouth 2 (two) times daily. 180 tablet 1  . calcium-vitamin D (OSCAL WITH D) 500-200 MG-UNIT per tablet Take 1 tablet by mouth daily.     . Cholecalciferol (VITAMIN D3) 2000 UNITS TABS Take 2,000 Units by mouth daily. 50 mcg    . Coenzyme Q10 (CO Q 10) 100 MG CAPS Take 300 mg by mouth daily.     . fish oil-omega-3 fatty  acids 1000 MG capsule Take 1 g by mouth daily.      . metoprolol succinate (TOPROL-XL) 25 MG 24 hr tablet Take 0.5 tablets (12.5 mg total) by mouth daily. 90 tablet 3  . Multiple Vitamin (MULTI-DAY VITAMINS PO) Take 1 tablet by mouth daily.     [name] omeprazole (PRILOSEC) 20 MG capsule Take 20 mg by mouth 2 (two) times daily before a meal.     . simvastatin (ZOCOR) 20 MG tablet Take 20 mg by mouth at bedtime.     . torsemide (DEMADEX) 20 MG tablet Take 40mg  in the AM and 20mg  in the PM    . Turmeric 450 MG CAPS Take 450 mg by mouth daily.     . vitamin C (ASCORBIC ACID) 250 MG tablet Take 250 mg by mouth daily.     . vitamin E 1000 UNIT capsule Take 1,000 Units by mouth every other day.     No current facility-administered medications for this encounter.   BP 110/62   Pulse (!) 57   Wt 69.1 kg (152 lb 4 oz)   SpO2 99%   BMI 21.23 kg/m   Wt Readings from Last 3 Encounters:  08/20/19 69.1 kg (152 lb 4 oz)  08/15/19 67.9 kg (149 lb 11.1 oz)  08/13/19 63.5 kg (140 lb)   General: NAD Neck: JVP 8 cm, no thyromegaly or thyroid nodule.  Lungs: Clear to auscultation bilaterally with normal respiratory effort. CV: Nondisplaced PMI.  Heart regular S1/S2, no S3/S4, no murmur. Trace ankle edema.  No carotid bruit.  Normal pedal pulses.  Abdomen: Soft, nontender, no hepatosplenomegaly, no distention.  Skin: Intact without lesions or rashes.  Neurologic: Alert and oriented x 3.  Psych: Normal affect. Extremities: No clubbing or cyanosis.  HEENT: Normal.    Assessment/Plan: 1. Chronic systolic CHF: Ischemic cardiomyopathy.  Echo in 8/20 with EF 20-25%, moderately decreased  RV systolic function.  St Jude ICD.  CHF has been complicated by CKD stage 4 with recent AKI.  RHC in 2/21 showed low but not markedly low cardiac output, he was admitted for diuresis with milrinone.  Milrinone was weaned off.  On exam today, he is no more than mildly volume overloaded.  Thoracic impedance was low recently on device interrogation but is now trending up.  - Continue torsemide 40 qam/20 qpm.  - He will stay off Entresto and spironolactone with elevated creatinine.  - Continue Toprol XL 12.5 mg daily (decreased due to bradycardia).  - Narrow QRS, not candidate for upgrade to CRT.  - PYP scan in 3/21 was equivocal (grade 1 but with H/CL 1.67).  Myeloma workup negative.  I will repeat PYP scan, set up at next appointment.  - Check BMET now. - I will set him up for device monitoring with Garth Bigness.   - Long discussion with patient and daughter about difficulty managing combination of advanced CHF and CKD stage 4.  2. CAD: S/p CABG 2007, patent grafts on LHC in 2016. No exertional chest pain.  - Given stable CAD and Eliquis use, he is not on ASA.  - Continue statin.  3. Atrial fibrillation: Paroxysmal.  He remains in NSR today.   - Continue amiodarone 200 mg daily.  Check LFTs and TSH next appointment.  He will need a regular eye exam while on amiodarone.  4. Right chronic pleural effusion: Only small effusion on recent CXR.  5. CKD stage 4:  Will have to follow BMET very carefully with diuresis.  He  has a followup appt with nephrology (seen by nephrology at recent hospital admission).  6. Carotid stenosis: Repeat dopplers in 8/21.    Followup with me in 2 wks.   [name]  08/20/2019

## 2019-08-20 NOTE — Progress Notes (Signed)
Pt seen by Dr. Aundra Dubin today. See his documentation.

## 2019-08-21 ENCOUNTER — Encounter (HOSPITAL_COMMUNITY): Payer: PPO

## 2019-08-21 ENCOUNTER — Telehealth: Payer: Self-pay

## 2019-08-21 DIAGNOSIS — E1122 Type 2 diabetes mellitus with diabetic chronic kidney disease: Secondary | ICD-10-CM | POA: Diagnosis not present

## 2019-08-21 DIAGNOSIS — I429 Cardiomyopathy, unspecified: Secondary | ICD-10-CM | POA: Diagnosis not present

## 2019-08-21 DIAGNOSIS — N183 Chronic kidney disease, stage 3 unspecified: Secondary | ICD-10-CM | POA: Diagnosis not present

## 2019-08-21 DIAGNOSIS — F439 Reaction to severe stress, unspecified: Secondary | ICD-10-CM | POA: Diagnosis not present

## 2019-08-21 DIAGNOSIS — I129 Hypertensive chronic kidney disease with stage 1 through stage 4 chronic kidney disease, or unspecified chronic kidney disease: Secondary | ICD-10-CM | POA: Diagnosis not present

## 2019-08-21 DIAGNOSIS — I480 Paroxysmal atrial fibrillation: Secondary | ICD-10-CM | POA: Diagnosis not present

## 2019-08-21 DIAGNOSIS — R5381 Other malaise: Secondary | ICD-10-CM | POA: Diagnosis not present

## 2019-08-21 DIAGNOSIS — D649 Anemia, unspecified: Secondary | ICD-10-CM | POA: Diagnosis not present

## 2019-08-21 NOTE — Telephone Encounter (Signed)
-----   Message from Scarlette Calico, RN sent at 08/20/2019 10:24 AM EDT ----- Marykay Lex, he has a St Jude ICD with [employer], can you please enroll him into your checks, thanks

## 2019-08-21 NOTE — Telephone Encounter (Signed)
Patient referred to [facility] clinic by Dr Aundra Dubin.  Spoke with patient and provided ICM intro.  He agreed to monthly ICM follow up.  He has device monitor about 10 feet from bed and knows how to send manual transmissions if needed.  Advised will scheduled fluid level check on 08/31/2019.  Provided ICM number and encouraged to call if he develops any fluid accumulation symptoms.

## 2019-08-26 ENCOUNTER — Encounter (HOSPITAL_COMMUNITY): Payer: PPO

## 2019-08-28 ENCOUNTER — Encounter (HOSPITAL_COMMUNITY): Payer: PPO

## 2019-08-31 ENCOUNTER — Ambulatory Visit (INDEPENDENT_AMBULATORY_CARE_PROVIDER_SITE_OTHER): Payer: PPO

## 2019-08-31 DIAGNOSIS — I5022 Chronic systolic (congestive) heart failure: Secondary | ICD-10-CM

## 2019-08-31 DIAGNOSIS — Z95810 Presence of automatic (implantable) cardiac defibrillator: Secondary | ICD-10-CM

## 2019-08-31 LAB — CUP PACEART REMOTE DEVICE CHECK
Battery Remaining Longevity: 82 mo
Battery Remaining Percentage: 81 %
Battery Voltage: 2.99 V
Brady Statistic RV Percent Paced: 1 %
Date Time Interrogation Session: 20210607020019
HighPow Impedance: 63 Ohm
HighPow Impedance: 63 Ohm
Implantable Lead Implant Date: 20190319
Implantable Lead Location: 753860
Implantable Pulse Generator Implant Date: 20190319
Lead Channel Impedance Value: 360 Ohm
Lead Channel Pacing Threshold Amplitude: 0.5 V
Lead Channel Pacing Threshold Pulse Width: 0.5 ms
Lead Channel Sensing Intrinsic Amplitude: 11.4 mV
Lead Channel Setting Pacing Amplitude: 2.5 V
Lead Channel Setting Pacing Pulse Width: 0.5 ms
Lead Channel Setting Sensing Sensitivity: 0.5 mV
Pulse Gen Serial Number: 9811279

## 2019-09-01 NOTE — Progress Notes (Signed)
EPIC Encounter for ICM Monitoring  Patient Name: [name]. is a 84 y.o. male Date: 09/01/2019 Primary Care Physican: Shirline Frees, MD Primary Cardiologist: Aundra Dubin Electrophysiologist: Curt Bears 09/01/2019 Weight: 146.6 lbs (baseline)        Heart Failure questions reviewed.  Pt asymptomatic for fluid symptoms.  Weight is at baseline   Report: Thoracic impedance normal.  Prescribed: Torsemide 20 mg take 2 tablets (40 mg total) every AM and 20 mg every PM  Recommendations:  Encouraged to call if experiencing fluid symptoms.  Follow-up plan: ICM clinic phone appointment on 10/05/2019.   91 day device clinic remote transmission 09/15/2019.   Copy of ICM check sent to Dr. Curt Bears.   3 month ICM trend: 08/31/2019    1 Year ICM trend:       Rosalene Billings, RN 09/01/2019 3:49 PM

## 2019-09-01 NOTE — Addendum Note (Signed)
Encounter addended by: Magda Kiel, RN on: 09/01/2019 2:08 PM  Actions taken: Clinical Note Signed

## 2019-09-03 ENCOUNTER — Encounter (HOSPITAL_COMMUNITY): Payer: Self-pay | Admitting: Cardiology

## 2019-09-03 ENCOUNTER — Ambulatory Visit (HOSPITAL_COMMUNITY)
Admission: RE | Admit: 2019-09-03 | Discharge: 2019-09-03 | Disposition: A | Discharge status: Home / Self Care | Payer: PPO | Source: Ambulatory Visit | Attending: Cardiology | Admitting: Cardiology

## 2019-09-03 ENCOUNTER — Other Ambulatory Visit: Payer: Self-pay

## 2019-09-03 VITALS — BP 130/58 | HR 48 | Ht 71.0 in | Wt 150.4 lb

## 2019-09-03 DIAGNOSIS — Z8349 Family history of other endocrine, nutritional and metabolic diseases: Secondary | ICD-10-CM | POA: Diagnosis not present

## 2019-09-03 DIAGNOSIS — Z87891 Personal history of nicotine dependence: Secondary | ICD-10-CM | POA: Diagnosis not present

## 2019-09-03 DIAGNOSIS — Z951 Presence of aortocoronary bypass graft: Secondary | ICD-10-CM | POA: Diagnosis not present

## 2019-09-03 DIAGNOSIS — I255 Ischemic cardiomyopathy: Secondary | ICD-10-CM | POA: Insufficient documentation

## 2019-09-03 DIAGNOSIS — Z79899 Other long term (current) drug therapy: Secondary | ICD-10-CM | POA: Insufficient documentation

## 2019-09-03 DIAGNOSIS — I130 Hypertensive heart and chronic kidney disease with heart failure and stage 1 through stage 4 chronic kidney disease, or unspecified chronic kidney disease: Secondary | ICD-10-CM | POA: Insufficient documentation

## 2019-09-03 DIAGNOSIS — I4891 Unspecified atrial fibrillation: Secondary | ICD-10-CM

## 2019-09-03 DIAGNOSIS — I6529 Occlusion and stenosis of unspecified carotid artery: Secondary | ICD-10-CM | POA: Insufficient documentation

## 2019-09-03 DIAGNOSIS — I2510 Atherosclerotic heart disease of native coronary artery without angina pectoris: Secondary | ICD-10-CM | POA: Diagnosis not present

## 2019-09-03 DIAGNOSIS — Z7901 Long term (current) use of anticoagulants: Secondary | ICD-10-CM | POA: Insufficient documentation

## 2019-09-03 DIAGNOSIS — I5022 Chronic systolic (congestive) heart failure: Secondary | ICD-10-CM

## 2019-09-03 DIAGNOSIS — E785 Hyperlipidemia, unspecified: Secondary | ICD-10-CM | POA: Insufficient documentation

## 2019-09-03 DIAGNOSIS — E1122 Type 2 diabetes mellitus with diabetic chronic kidney disease: Secondary | ICD-10-CM | POA: Diagnosis not present

## 2019-09-03 DIAGNOSIS — J90 Pleural effusion, not elsewhere classified: Secondary | ICD-10-CM | POA: Insufficient documentation

## 2019-09-03 DIAGNOSIS — N184 Chronic kidney disease, stage 4 (severe): Secondary | ICD-10-CM | POA: Insufficient documentation

## 2019-09-03 DIAGNOSIS — Z8249 Family history of ischemic heart disease and other diseases of the circulatory system: Secondary | ICD-10-CM | POA: Diagnosis not present

## 2019-09-03 DIAGNOSIS — I480 Paroxysmal atrial fibrillation: Secondary | ICD-10-CM | POA: Diagnosis not present

## 2019-09-03 LAB — CBC
HCT: 31.2 % — ABNORMAL LOW (ref 39.0–52.0)
Hemoglobin: 10 g/dL — ABNORMAL LOW (ref 13.0–17.0)
MCH: 30.3 pg (ref 26.0–34.0)
MCHC: 32.1 g/dL (ref 30.0–36.0)
MCV: 94.5 fL (ref 80.0–100.0)
Platelets: 220 10*3/uL (ref 150–400)
RBC: 3.3 MIL/uL — ABNORMAL LOW (ref 4.22–5.81)
RDW: 17 % — ABNORMAL HIGH (ref 11.5–15.5)
WBC: 7.1 10*3/uL (ref 4.0–10.5)
nRBC: 0 % (ref 0.0–0.2)

## 2019-09-03 LAB — COMPREHENSIVE METABOLIC PANEL
ALT: 16 U/L (ref 0–44)
AST: 17 U/L (ref 15–41)
Albumin: 3.3 g/dL — ABNORMAL LOW (ref 3.5–5.0)
Alkaline Phosphatase: 80 U/L (ref 38–126)
Anion gap: 12 (ref 5–15)
BUN: 63 mg/dL — ABNORMAL HIGH (ref 8–23)
CO2: 31 mmol/L (ref 22–32)
Calcium: 9 mg/dL (ref 8.9–10.3)
Chloride: 95 mmol/L — ABNORMAL LOW (ref 98–111)
Creatinine, Ser: 3.32 mg/dL — ABNORMAL HIGH (ref 0.61–1.24)
GFR calc Af Amer: 19 mL/min — ABNORMAL LOW (ref >60)
GFR calc non Af Amer: 16 mL/min — ABNORMAL LOW (ref >60)
Glucose, Bld: 310 mg/dL — ABNORMAL HIGH (ref 70–99)
Potassium: 4 mmol/L (ref 3.5–5.1)
Sodium: 138 mmol/L (ref 135–145)
Total Bilirubin: 0.6 mg/dL (ref 0.3–1.2)
Total Protein: 6.9 g/dL (ref 6.5–8.1)

## 2019-09-03 LAB — TSH: TSH: 2.18 u[IU]/mL (ref 0.350–4.500)

## 2019-09-03 NOTE — Progress Notes (Signed)
PCP: Shirline Frees, MD Cardiology: Dr. Angelena Form HF Cardiology: Dr. Aundra Dubin  84 y.o. with h/o CAD s/p CABG, ischemic cardiomyopathy, and diabetes was referred by Dr. Angelena Form for evaluation of CHF.  Patient had CABG in 2007.  Last cath in 2016 showed all 4 grafts patent.  Echo was 25-30% in 2016.  Most recent echo in 8/20 showed EF 20-25% with moderate RV dysfunction.  He has had a recurrent right pleural effusion with thoracenteses in 5/19 and 8/20.  He thinks that he has been in atrial fibrillation persistently for about a year.  He used to take amiodarone but stopped it because he "felt bad."  However, he does not think that stopping amiodarone made him feel any better.  Creatinine has been elevated, CKD stage 3.   At a prior appt, I thought he was volume overloaded and increased Lasix, but his creatinine steadily rose (2.55 => 3.13 => 3.72). I stopped Entresto and he never started spironolactone.  Lasix was cut back to 60 mg daily. He says that during this time, he got his COVID vaccination and after that, he became severely nauseated for a number of days and ate/drank very little.  This eventually resolved.   On 05/11/19, I cardioverted him back to NSR.    Later in 2/21, he had RHC.  This showed elevated right and left heart filling pressures with low but not markedly low cardiac output.  He was admitted for IV diuresis.  While diuresing, he was maintained on milrinone.  This was weaned prior to discharge. PYP scan done in the hospital was equivocal for ATTR amyloidosis. He was discharged on torsemide.   He was seen by Darrick Grinder in 5/21 with significant weight loss.  He was thought to be volume depleted/dehydrated and creatinine was > 5.  He got IV fluid in the clinic but was still lightheaded and weak so was admitted.  He got IV fluid in the hospital and torsemide was stopped.  Weight started to rise, so he was started back on lower dose of torsemide, 40 qam/20 qpm.    He returns today for  followup of CHF.  Weight has been stable.  No dyspnea walking on flat ground.  Occasional lightheadedness with standing. No chest pain.  No orthopnea/PND.    Labs (12/20): K 4.5, creatinine 2.01 => 1.84  Labs (2/21): K 3.9, creatinine 2.55 => 3.13 => 3.72 Labs (3/21): hgb 11.1, K 3.6 => 4.6, creatinine 3.04 => 3.64, myeloma panel negative, urine immunofixation negative. TSH and LFTs normal.  Labs (5/21): K 4, creatinine 5.1 => 3.48  ECG (personally reviewed): NSR, 1st degree AVB  St Jude device interrogation: No VT, stable thoracic impedance.   PMH: 1. Type 2 diabetes  2. HTN 3. Hyperlipidemia 4. CAD: CABG x 4 in 2007 with LIMA-LAD, SVG-OM, sequential SVG-PLV/PDA.   - LHC (2016): Patent grafts.  5. Atrial fibrillation: Persistent.  - DCCV to NSR 2/21.  6. CKD stage 4.  7. Carotid stenosis:  - Carotid dopplers (8/20): 60-79% RICA stenosis.  8. Chronic systolic CHF: Ischemic cardiomyopathy.    - Echo (2016): EF 25-30% - Echo (2018): EF 25% - Echo (8/20): EF 20-25%, moderately decreased RV systolic function, severe LAE.  - St Jude ICD.   - RHC (3/21): mean RA 18, PA 71/27, mean PCWP 24, CI 2.16 (thermo), CI 2.2 (Fick), PVR 4.6 WU, PAPi 2.4.  - PYP scan (3/21): grade 1 but H/CL ratio 1.6 => equivocal for TTR amyloidosis. .  - PYP scan (  5/21): grade 1, H/CL ratio 1.2 => not suggestive of TTR amyloidosis.  9. Pleural effusion on right: s/p thoracentesis in 5/19 and 8/20.   Social History   Socioeconomic History  . Marital status: Married    Spouse name: Not on file  . Number of children: 2  . Years of education: Not on file  . Highest education level: Not on file  Occupational History  . Occupation: Horticulturist, commercial: RETIRED  Tobacco Use  . Smoking status: Former Smoker    Types: Cigarettes    Quit date: 03/26/1970    Years since quitting: 49.4  . Smokeless tobacco: Never Used  Vaping Use  . Vaping Use: Never used  Substance and Sexual Activity  . Alcohol use: Yes     Alcohol/week: 1.0 standard drink    Types: 1 Glasses of wine per week  . Drug use: No  . Sexual activity: Not on file  Other Topics Concern  . Not on file  Social History Narrative  . Not on file   Social Determinants of Health   Financial Resource Strain:   . Difficulty of Paying Living Expenses:   Food Insecurity:   . Worried About Charity fundraiser in the Last Year:   . Arboriculturist in the Last Year:   Transportation Needs: No Transportation Needs  . Lack of Transportation (Medical): No  . Lack of Transportation (Non-Medical): No  Physical Activity: Inactive  . Days of Exercise per Week: 0 days  . Minutes of Exercise per Session: 0 min  Stress:   . Feeling of Stress :   Social Connections:   . Frequency of Communication with Friends and Family:   . Frequency of Social Gatherings with Friends and Family:   . Attends Religious Services:   . Active Member of Clubs or Organizations:   . Attends Archivist Meetings:   [name] Marital Status:   Intimate Partner Violence:   . Fear of Current or Ex-Partner:   . Emotionally Abused:   [name] Physically Abused:   . Sexually Abused:    Family History  Problem Relation Age of Onset  . Heart disease Father 32       Heart disease before age 62  . Heart attack Father   . Hypertension Father   . Hyperlipidemia Father   . Varicose Veins Father   . Stroke Father        ? not sure  . Colon cancer Maternal Aunt   . Colon cancer Maternal Uncle   . Diverticulitis Other   . Heart attack Other    ROS: All systems reviewed and negative except as per HPI.   Current Outpatient Medications  Medication Sig Dispense Refill  . amiodarone (PACERONE) 200 MG tablet Take 1 tablet (200 mg total) by mouth daily. 30 tablet 4  . apixaban (ELIQUIS) 2.5 MG TABS tablet Take 1 tablet (2.5 mg total) by mouth 2 (two) times daily. 180 tablet 1  . calcium-vitamin D (OSCAL WITH D) 500-200 MG-UNIT per tablet Take 1 tablet by mouth daily.     .  Cholecalciferol (VITAMIN D3) 2000 UNITS TABS Take 2,000 Units by mouth daily. 50 mcg    . Coenzyme Q10 (CO Q 10) 100 MG CAPS Take 300 mg by mouth daily.     . fish oil-omega-3 fatty acids 1000 MG capsule Take 1 g by mouth daily.      . metoprolol succinate (TOPROL-XL) 25 MG 24 hr tablet Take  0.5 tablets (12.5 mg total) by mouth daily. 90 tablet 3  . Multiple Vitamin (MULTI-DAY VITAMINS PO) Take 1 tablet by mouth daily.     [name] omeprazole (PRILOSEC) 20 MG capsule Take 20 mg by mouth 2 (two) times daily before a meal.     . simvastatin (ZOCOR) 20 MG tablet Take 20 mg by mouth at bedtime.     . torsemide (DEMADEX) 20 MG tablet Take 40mg  in the AM and 20mg  in the PM    . Turmeric 450 MG CAPS Take 450 mg by mouth daily.     . vitamin C (ASCORBIC ACID) 250 MG tablet Take 250 mg by mouth daily.     . vitamin E 1000 UNIT capsule Take 1,000 Units by mouth every other day.     No current facility-administered medications for this encounter.   BP (!) 130/58   Pulse (!) 48   Ht 5\' 11"  (1.803 m)   Wt 68.2 kg (150 lb 6.4 oz)   SpO2 100%   BMI 20.98 kg/m   Wt Readings from Last 3 Encounters:  09/03/19 68.2 kg (150 lb 6.4 oz)  08/20/19 69.1 kg (152 lb 4 oz)  08/15/19 67.9 kg (149 lb 11.1 oz)   General: NAD Neck: No JVD, no thyromegaly or thyroid nodule.  Lungs: Clear to auscultation bilaterally with normal respiratory effort. CV: Nondisplaced PMI.  Heart regular S1/S2, no S3/S4, no murmur.  No peripheral edema.  No carotid bruit.  Normal pedal pulses.  Abdomen: Soft, nontender, no hepatosplenomegaly, no distention.  Skin: Intact without lesions or rashes.  Neurologic: Alert and oriented x 3.  Psych: Normal affect. Extremities: No clubbing or cyanosis.  HEENT: Normal.    Assessment/Plan: 1. Chronic systolic CHF: Ischemic cardiomyopathy.  Echo in 8/20 with EF 20-25%, moderately decreased RV systolic function.  St Jude ICD.  CHF has been complicated by CKD stage 4 with recent AKI.  RHC in 2/21  showed low but not markedly low cardiac output, he was admitted for diuresis with milrinone.  Milrinone was weaned off.  He is not significantly volume overloaded by exam or [facility]. NYHA class II symptoms.  - Continue torsemide 40 qam/20 qpm. BMET today.  - He will stay off Entresto and spironolactone with elevated creatinine.  - Continue Toprol XL 12.5 mg daily (decreased due to bradycardia).  - Narrow QRS, not candidate for upgrade to CRT.  - PYP scan in 5/21 not suggestive of transthyretin amyloidosis.   2. CAD: S/p CABG 2007, patent grafts on LHC in 2016. No exertional chest pain.  - Given stable CAD and Eliquis use, he is not on ASA.  - Continue statin.  3. Atrial fibrillation: Paroxysmal.  He remains in NSR today.   - Continue amiodarone 200 mg daily.  Check LFTs and TSH today.  He will need a regular eye exam while on amiodarone.  4. Right chronic pleural effusion: Only small effusion on recent CXR.  5. CKD stage 4:  Will have to follow BMET very carefully with diuresis.  He has a followup appt with nephrology (seen by nephrology at recent hospital admission).  6. Carotid stenosis: Repeat dopplers in 8/21.    Followup with NP/PA in 1 month.   [name]  09/03/2019

## 2019-09-03 NOTE — Patient Instructions (Signed)
Labs done today, your results will be available in MyChart, we will contact you for abnormal readings.  You are scheduled for a remote device check on 7/13 with Margarita Grizzle to monitor your fluid level  Your physician recommends that you schedule a follow-up appointment in: 1 month  If you have any questions or concerns before your next appointment please send [place] a message through [place] or call our office at [phone].    TO LEAVE A MESSAGE FOR THE NURSE SELECT OPTION 2, PLEASE LEAVE A MESSAGE INCLUDING: . YOUR NAME . DATE OF BIRTH . CALL BACK NUMBER . REASON FOR CALL**this is important as we prioritize the call backs  [place] AS LONG AS YOU CALL BEFORE 4:00 PM  At the [facility], you and your health needs are our priority. As part of our continuing mission to provide you with exceptional heart care, we have created designated Provider Care Teams. These Care Teams include your primary Cardiologist (physician) and Advanced Practice Providers (APPs- Physician Assistants and Nurse Practitioners) who all work together to provide you with the care you need, when you need it.   You may see any of the following providers on your designated Care Team at your next follow up: [name] Dr Glori Bickers . Dr [name] . Darrick Grinder, NP . Lyda Jester, PA . Audry Riles, PharmD   Please be sure to bring in all your medications bottles to every appointment.

## 2019-09-08 IMAGING — DX DG CHEST 2V
2 series · 2 of 2 positions shown · non-contrast
Comparison: 06/19/2017

CLINICAL DATA: RIGHT pleural effusion, follow-up, diabetes
mellitus, hypertension, former smoker, coronary artery disease post
MI and CABG, defibrillator placement

EXAM:
CHEST - 2 VIEW

[chest pa]
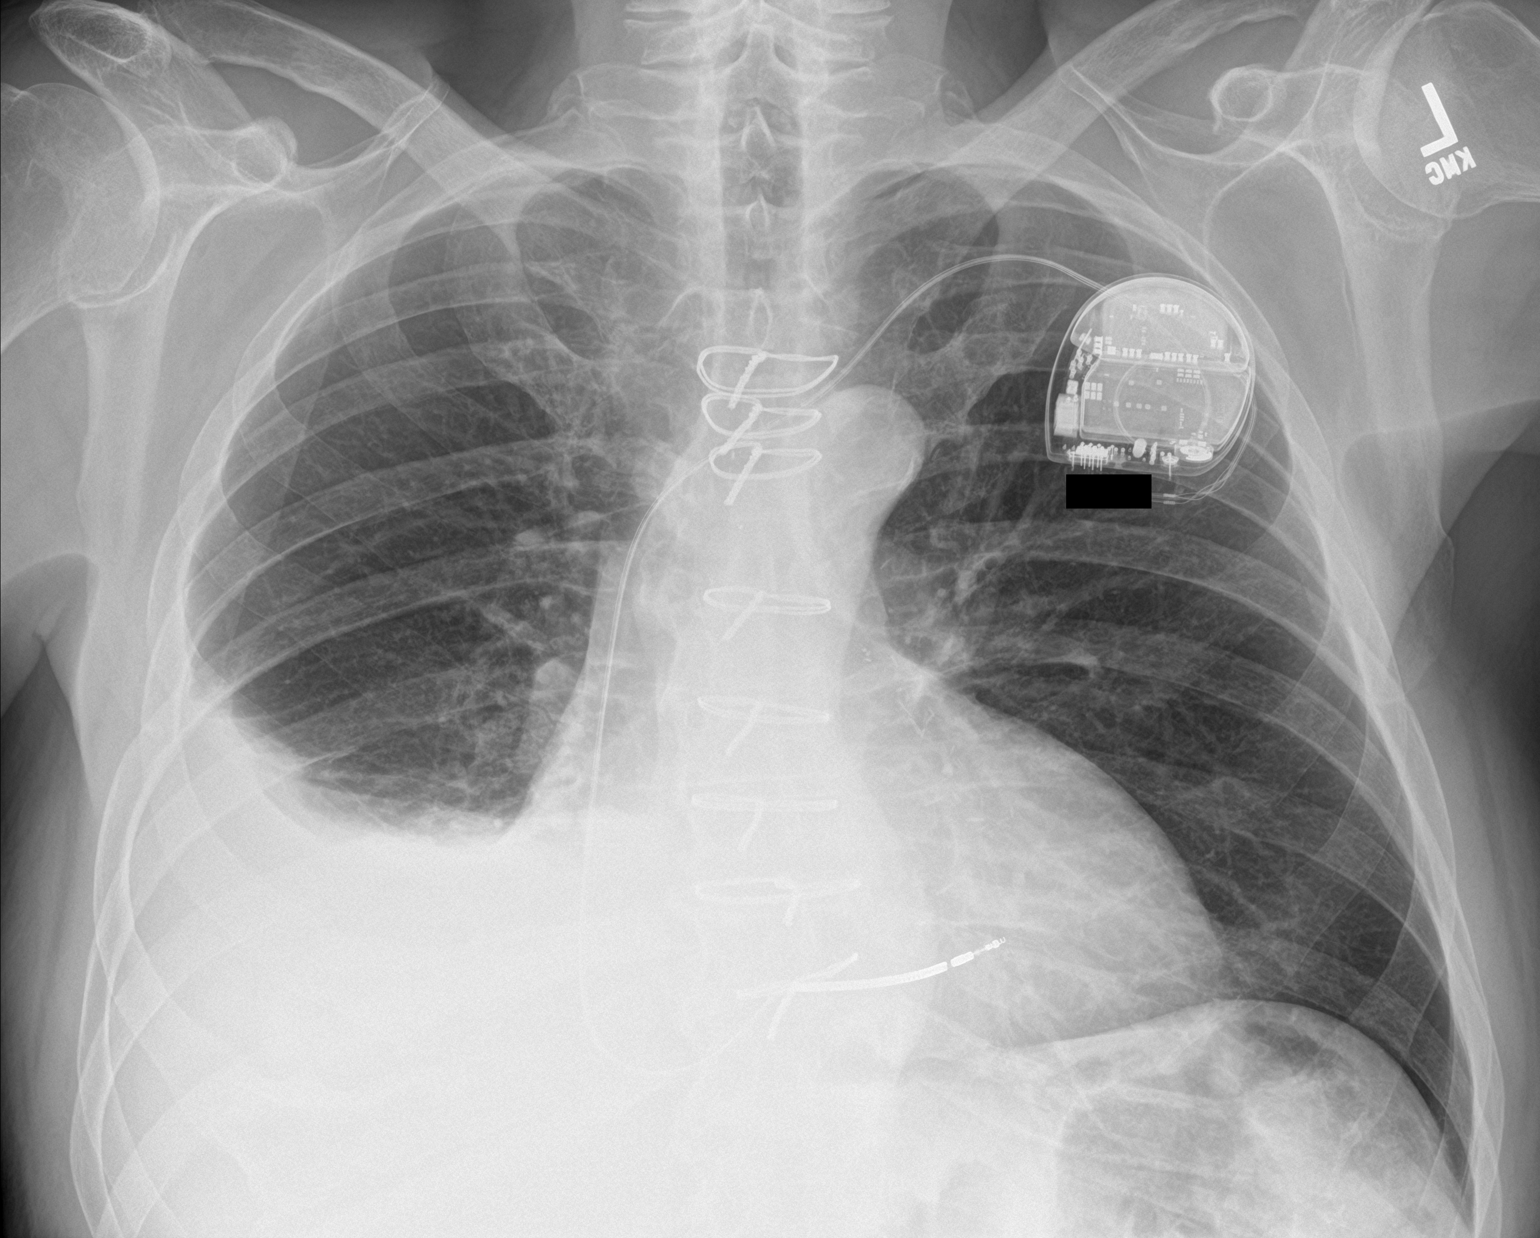

[chest lat]
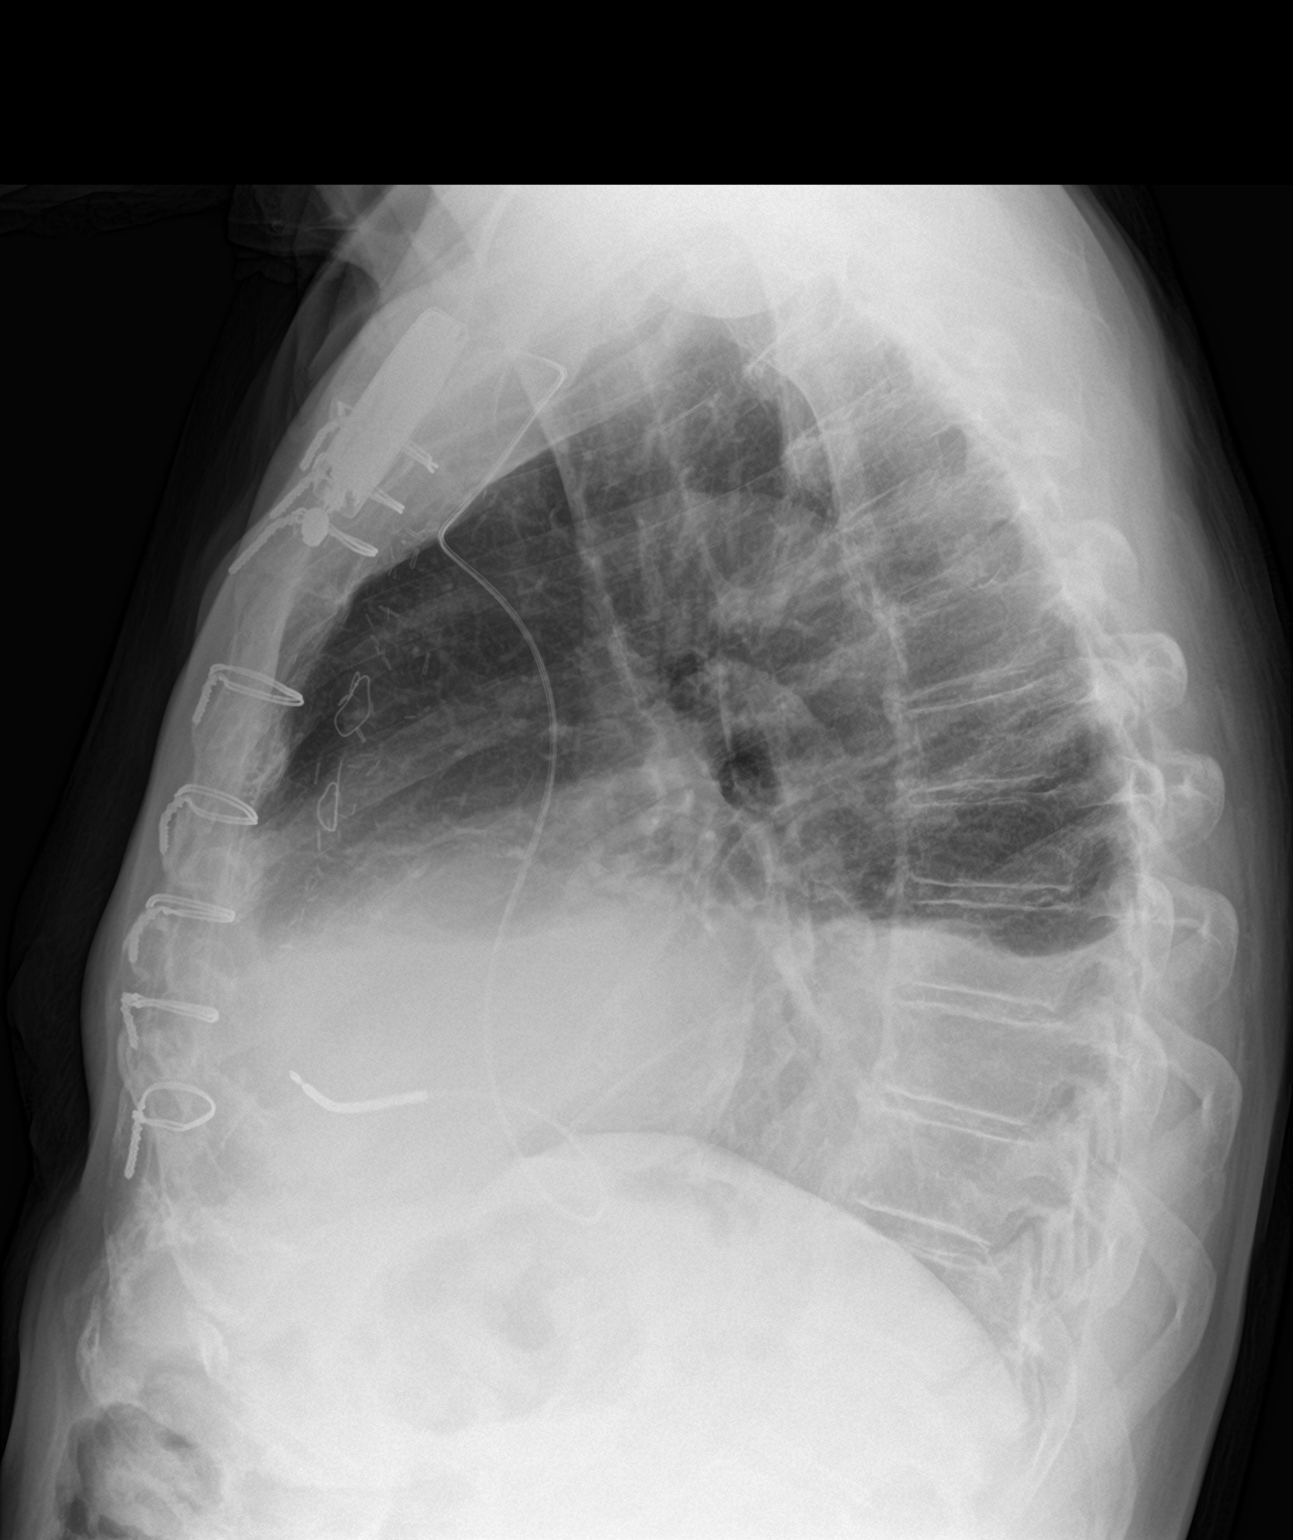

[2 of 2 positions shown; findings below may reference images not displayed]

FINDINGS: LEFT subclavian AICD lead projects at RIGHT ventricle.

Mild enlargement of cardiac silhouette.

Atherosclerotic calcifications aorta.

Mediastinal contours and pulmonary vascularity normal.

Moderate RIGHT pleural effusion slightly increased since previous
exam with associated RIGHT basilar atelectasis.

Remaining lungs clear.

No pneumothorax.

Bones unremarkable.
IMPRESSION: Moderate RIGHT pleural effusion and basilar atelectasis slightly
increased since previous exam.

## 2019-09-11 DIAGNOSIS — E1122 Type 2 diabetes mellitus with diabetic chronic kidney disease: Secondary | ICD-10-CM | POA: Diagnosis not present

## 2019-09-11 DIAGNOSIS — N179 Acute kidney failure, unspecified: Secondary | ICD-10-CM | POA: Diagnosis not present

## 2019-09-11 DIAGNOSIS — I5022 Chronic systolic (congestive) heart failure: Secondary | ICD-10-CM | POA: Diagnosis not present

## 2019-09-11 DIAGNOSIS — D631 Anemia in chronic kidney disease: Secondary | ICD-10-CM | POA: Diagnosis not present

## 2019-09-11 DIAGNOSIS — I2510 Atherosclerotic heart disease of native coronary artery without angina pectoris: Secondary | ICD-10-CM | POA: Diagnosis not present

## 2019-09-11 DIAGNOSIS — I129 Hypertensive chronic kidney disease with stage 1 through stage 4 chronic kidney disease, or unspecified chronic kidney disease: Secondary | ICD-10-CM | POA: Diagnosis not present

## 2019-09-11 DIAGNOSIS — N184 Chronic kidney disease, stage 4 (severe): Secondary | ICD-10-CM | POA: Diagnosis not present

## 2019-09-15 ENCOUNTER — Ambulatory Visit (INDEPENDENT_AMBULATORY_CARE_PROVIDER_SITE_OTHER): Payer: PPO | Admitting: *Deleted

## 2019-09-15 DIAGNOSIS — I5022 Chronic systolic (congestive) heart failure: Secondary | ICD-10-CM

## 2019-09-15 DIAGNOSIS — I255 Ischemic cardiomyopathy: Secondary | ICD-10-CM

## 2019-09-15 LAB — CUP PACEART REMOTE DEVICE CHECK
Battery Remaining Longevity: 83 mo
Battery Remaining Percentage: 81 %
Battery Voltage: 2.99 V
Brady Statistic RV Percent Paced: 1 %
Date Time Interrogation Session: 20210622020016
HighPow Impedance: 69 Ohm
HighPow Impedance: 69 Ohm
Implantable Lead Implant Date: 20190319
Implantable Lead Location: 753860
Implantable Pulse Generator Implant Date: 20190319
Lead Channel Impedance Value: 390 Ohm
Lead Channel Pacing Threshold Amplitude: 0.5 V
Lead Channel Pacing Threshold Pulse Width: 0.5 ms
Lead Channel Sensing Intrinsic Amplitude: 12 mV
Lead Channel Setting Pacing Amplitude: 2.5 V
Lead Channel Setting Pacing Pulse Width: 0.5 ms
Lead Channel Setting Sensing Sensitivity: 0.5 mV
Pulse Gen Serial Number: 9811279

## 2019-09-16 NOTE — Progress Notes (Signed)
Remote ICD transmission.   

## 2019-09-21 DIAGNOSIS — I429 Cardiomyopathy, unspecified: Secondary | ICD-10-CM | POA: Diagnosis not present

## 2019-09-22 DIAGNOSIS — I129 Hypertensive chronic kidney disease with stage 1 through stage 4 chronic kidney disease, or unspecified chronic kidney disease: Secondary | ICD-10-CM | POA: Diagnosis not present

## 2019-09-22 DIAGNOSIS — I480 Paroxysmal atrial fibrillation: Secondary | ICD-10-CM | POA: Diagnosis not present

## 2019-09-22 DIAGNOSIS — E1122 Type 2 diabetes mellitus with diabetic chronic kidney disease: Secondary | ICD-10-CM | POA: Diagnosis not present

## 2019-09-22 DIAGNOSIS — E782 Mixed hyperlipidemia: Secondary | ICD-10-CM | POA: Diagnosis not present

## 2019-09-22 DIAGNOSIS — I10 Essential (primary) hypertension: Secondary | ICD-10-CM | POA: Diagnosis not present

## 2019-09-22 DIAGNOSIS — N183 Chronic kidney disease, stage 3 unspecified: Secondary | ICD-10-CM | POA: Diagnosis not present

## 2019-09-22 DIAGNOSIS — I2510 Atherosclerotic heart disease of native coronary artery without angina pectoris: Secondary | ICD-10-CM | POA: Diagnosis not present

## 2019-09-22 DIAGNOSIS — D649 Anemia, unspecified: Secondary | ICD-10-CM | POA: Diagnosis not present

## 2019-10-01 ENCOUNTER — Telehealth (HOSPITAL_COMMUNITY): Payer: Self-pay | Admitting: *Deleted

## 2019-10-01 NOTE — Telephone Encounter (Signed)
[name] with [facility] left a VM that she faxed patients weights over. She stated pt has experienced rapid weight loss and would like Dr.McLean to review. I pulled the fax an gave it to [place].  Call back #[phone]

## 2019-10-02 ENCOUNTER — Telehealth (HOSPITAL_COMMUNITY): Payer: Self-pay | Admitting: Cardiology

## 2019-10-02 DIAGNOSIS — I5022 Chronic systolic (congestive) heart failure: Secondary | ICD-10-CM

## 2019-10-02 NOTE — Telephone Encounter (Signed)
Rapid weight loss reported by pcp notes 09/24/2019- 146 10/01/19-137  Per Dr Aundra Dubin hold diuretics over the weekend/x 2days. Return 7/12 for stat BMET  Pt aware and voiced understanding

## 2019-10-05 ENCOUNTER — Ambulatory Visit (HOSPITAL_COMMUNITY)
Admission: RE | Admit: 2019-10-05 | Discharge: 2019-10-05 | Disposition: A | Discharge status: Home / Self Care | Payer: PPO | Source: Ambulatory Visit | Attending: Cardiology | Admitting: Cardiology

## 2019-10-05 ENCOUNTER — Ambulatory Visit (INDEPENDENT_AMBULATORY_CARE_PROVIDER_SITE_OTHER): Payer: PPO

## 2019-10-05 ENCOUNTER — Other Ambulatory Visit: Payer: Self-pay

## 2019-10-05 DIAGNOSIS — I5022 Chronic systolic (congestive) heart failure: Secondary | ICD-10-CM | POA: Diagnosis not present

## 2019-10-05 DIAGNOSIS — Z95810 Presence of automatic (implantable) cardiac defibrillator: Secondary | ICD-10-CM

## 2019-10-05 LAB — BASIC METABOLIC PANEL
Anion gap: 14 (ref 5–15)
BUN: 62 mg/dL — ABNORMAL HIGH (ref 8–23)
CO2: 24 mmol/L (ref 22–32)
Calcium: 8.6 mg/dL — ABNORMAL LOW (ref 8.9–10.3)
Chloride: 92 mmol/L — ABNORMAL LOW (ref 98–111)
Creatinine, Ser: 3.09 mg/dL — ABNORMAL HIGH (ref 0.61–1.24)
GFR calc Af Amer: 21 mL/min — ABNORMAL LOW (ref >60)
GFR calc non Af Amer: 18 mL/min — ABNORMAL LOW (ref >60)
Glucose, Bld: 511 mg/dL (ref 70–99)
Potassium: 4.2 mmol/L (ref 3.5–5.1)
Sodium: 130 mmol/L — ABNORMAL LOW (ref 135–145)

## 2019-10-06 DIAGNOSIS — H6123 Impacted cerumen, bilateral: Secondary | ICD-10-CM | POA: Diagnosis not present

## 2019-10-06 DIAGNOSIS — I429 Cardiomyopathy, unspecified: Secondary | ICD-10-CM | POA: Diagnosis not present

## 2019-10-06 DIAGNOSIS — Z7984 Long term (current) use of oral hypoglycemic drugs: Secondary | ICD-10-CM | POA: Diagnosis not present

## 2019-10-06 DIAGNOSIS — I10 Essential (primary) hypertension: Secondary | ICD-10-CM | POA: Diagnosis not present

## 2019-10-06 DIAGNOSIS — E1122 Type 2 diabetes mellitus with diabetic chronic kidney disease: Secondary | ICD-10-CM | POA: Diagnosis not present

## 2019-10-06 NOTE — Progress Notes (Signed)
EPIC Encounter for ICM Monitoring  Patient Name: [name]. is a 84 y.o. male Date: 10/06/2019 Primary Care Physican: Shirline Frees, MD Primary Cardiologist: Aundra Dubin Electrophysiologist: Curt Bears 10/02/2019 Weight: 137 lbs (baseline is 146 lbs)                                                            Spoke with wife due to patient was at the PCP office because of Blood sugar lab result of 511.  Per HF clinic phone note PCP advised that patient's weight dropped from 146-137 lbs in a week.  Torsemide held x 2 days and restarted 7/12   Wife reports patient has not been feeling well.  They recently placed their son in a nursing home and they are in the process of packing so they can sell their home.  They will be moving into [place].   CorVue thoracic impedance normal but suggested dryness from 7/8-7/11  Prescribed: Torsemide 20 mg take 2 tablets (40 mg total) every AM and 20 mg every PM.  Torsemide held 7/9 & 7/10 and restarted 7/12 per Dr Claris Gladden instructions.  Recommendations:  Provided ICM number and encouraged to call for changes.   Follow-up plan: ICM clinic phone appointment on 10/14/2019 to recheck fluid levels.   91 day device clinic remote transmission 12/15/2019.    EP/Cardiology Office Visits: 10/20/2019 with Advanced HF clinic PA/NP.    Copy of ICM check sent to Dr. Curt Bears and Dr Aundra Dubin for [facility].   3 month ICM trend: 10/05/2019    1 Year ICM trend:       Rosalene Billings, RN 10/06/2019 3:00 PM

## 2019-10-07 DIAGNOSIS — E782 Mixed hyperlipidemia: Secondary | ICD-10-CM | POA: Diagnosis not present

## 2019-10-07 DIAGNOSIS — I480 Paroxysmal atrial fibrillation: Secondary | ICD-10-CM | POA: Diagnosis not present

## 2019-10-07 DIAGNOSIS — I2510 Atherosclerotic heart disease of native coronary artery without angina pectoris: Secondary | ICD-10-CM | POA: Diagnosis not present

## 2019-10-07 DIAGNOSIS — N183 Chronic kidney disease, stage 3 unspecified: Secondary | ICD-10-CM | POA: Diagnosis not present

## 2019-10-07 DIAGNOSIS — I129 Hypertensive chronic kidney disease with stage 1 through stage 4 chronic kidney disease, or unspecified chronic kidney disease: Secondary | ICD-10-CM | POA: Diagnosis not present

## 2019-10-07 DIAGNOSIS — D649 Anemia, unspecified: Secondary | ICD-10-CM | POA: Diagnosis not present

## 2019-10-07 DIAGNOSIS — I10 Essential (primary) hypertension: Secondary | ICD-10-CM | POA: Diagnosis not present

## 2019-10-07 DIAGNOSIS — E1122 Type 2 diabetes mellitus with diabetic chronic kidney disease: Secondary | ICD-10-CM | POA: Diagnosis not present

## 2019-10-12 ENCOUNTER — Other Ambulatory Visit: Payer: Self-pay

## 2019-10-12 MED ORDER — APIXABAN 2.5 MG PO TABS: 2.5000 mg | ORAL_TABLET | Freq: Two times a day (BID) | ORAL | 1 refills | Status: DC

## 2019-10-12 NOTE — Telephone Encounter (Signed)
Received faxed refill for Eliquis from [facility].  Pt last saw Dr Aundra Dubin 09/03/19, last labs 10/05/19 Creat 3.09, age 84, weight 68.2kg, based on specified criteria pt is on appropriate dosage of Eliquis 2.5mg  BID.  Will refill rx.

## 2019-10-14 ENCOUNTER — Ambulatory Visit (INDEPENDENT_AMBULATORY_CARE_PROVIDER_SITE_OTHER): Payer: PPO

## 2019-10-14 DIAGNOSIS — I5022 Chronic systolic (congestive) heart failure: Secondary | ICD-10-CM

## 2019-10-14 DIAGNOSIS — Z95810 Presence of automatic (implantable) cardiac defibrillator: Secondary | ICD-10-CM

## 2019-10-16 ENCOUNTER — Telehealth: Payer: Self-pay

## 2019-10-16 NOTE — Progress Notes (Signed)
EPIC Encounter for ICM Monitoring  Patient Name: [name]. is a 84 y.o. male Date: 10/16/2019 Primary Care Physican: Shirline Frees, MD Primary Cardiologist:McLean Electrophysiologist:Camnitz 10/16/2019 Weight:142lbs   Spoke with patient.  He reports he is feeling well at this time.  Weight has stabilized at 242 pounds which he says is baseline.  Urination with Torsemide has been good.  In the process of moving into [place].   CorVue thoracic impedance suggesting possible fluid accumulation since 10/13/2019.   Prescribed: Torsemide20 mg take 2 tablets (40 mg total) every AM and 20 mg every PM.    Recommendations: No changes and encouraged to call if experiencing any fluid symptoms.  Follow-up plan: ICM clinic phone appointment on 10/27/2019 to recheck fluid levels.   91 day device clinic remote transmission 12/15/2019.    EP/Cardiology Office Visits: 10/20/2019 with Advanced HF clinic PA/NP.    Copy of ICM check sent to Dr. Curt Bears.   3 month ICM trend: 10/14/2019    1 Year ICM trend:       Rosalene Billings, RN 10/16/2019 3:26 PM

## 2019-10-16 NOTE — Telephone Encounter (Signed)
Remote ICM transmission received.  Attempted call to patient and he returned the call.

## 2019-10-20 ENCOUNTER — Encounter (HOSPITAL_COMMUNITY): Payer: PPO

## 2019-10-21 DIAGNOSIS — Z794 Long term (current) use of insulin: Secondary | ICD-10-CM | POA: Diagnosis not present

## 2019-10-21 DIAGNOSIS — L98491 Non-pressure chronic ulcer of skin of other sites limited to breakdown of skin: Secondary | ICD-10-CM | POA: Diagnosis not present

## 2019-10-21 DIAGNOSIS — E1122 Type 2 diabetes mellitus with diabetic chronic kidney disease: Secondary | ICD-10-CM | POA: Diagnosis not present

## 2019-10-21 DIAGNOSIS — I429 Cardiomyopathy, unspecified: Secondary | ICD-10-CM | POA: Diagnosis not present

## 2019-10-26 ENCOUNTER — Other Ambulatory Visit (HOSPITAL_COMMUNITY): Payer: Self-pay | Admitting: Adult Health

## 2019-10-27 ENCOUNTER — Emergency Department (HOSPITAL_COMMUNITY): Payer: PPO

## 2019-10-27 ENCOUNTER — Ambulatory Visit: Payer: PPO

## 2019-10-27 ENCOUNTER — Encounter (HOSPITAL_COMMUNITY): Payer: Self-pay | Admitting: Emergency Medicine

## 2019-10-27 ENCOUNTER — Emergency Department (HOSPITAL_COMMUNITY)
Admission: EM | Admit: 2019-10-27 | Discharge: 2019-10-27 | Disposition: A | Discharge status: Home / Self Care | Payer: PPO | Attending: Emergency Medicine | Admitting: Emergency Medicine

## 2019-10-27 ENCOUNTER — Other Ambulatory Visit: Payer: Self-pay

## 2019-10-27 DIAGNOSIS — R5383 Other fatigue: Secondary | ICD-10-CM | POA: Insufficient documentation

## 2019-10-27 DIAGNOSIS — R55 Syncope and collapse: Secondary | ICD-10-CM | POA: Diagnosis not present

## 2019-10-27 DIAGNOSIS — R001 Bradycardia, unspecified: Secondary | ICD-10-CM | POA: Diagnosis not present

## 2019-10-27 DIAGNOSIS — R531 Weakness: Secondary | ICD-10-CM | POA: Insufficient documentation

## 2019-10-27 DIAGNOSIS — I25110 Atherosclerotic heart disease of native coronary artery with unstable angina pectoris: Secondary | ICD-10-CM | POA: Insufficient documentation

## 2019-10-27 DIAGNOSIS — Z79899 Other long term (current) drug therapy: Secondary | ICD-10-CM | POA: Diagnosis not present

## 2019-10-27 DIAGNOSIS — N189 Chronic kidney disease, unspecified: Secondary | ICD-10-CM | POA: Diagnosis not present

## 2019-10-27 DIAGNOSIS — E162 Hypoglycemia, unspecified: Secondary | ICD-10-CM | POA: Insufficient documentation

## 2019-10-27 DIAGNOSIS — J90 Pleural effusion, not elsewhere classified: Secondary | ICD-10-CM | POA: Diagnosis not present

## 2019-10-27 DIAGNOSIS — E1159 Type 2 diabetes mellitus with other circulatory complications: Secondary | ICD-10-CM | POA: Insufficient documentation

## 2019-10-27 DIAGNOSIS — Z87891 Personal history of nicotine dependence: Secondary | ICD-10-CM | POA: Diagnosis not present

## 2019-10-27 DIAGNOSIS — I5022 Chronic systolic (congestive) heart failure: Secondary | ICD-10-CM

## 2019-10-27 DIAGNOSIS — I440 Atrioventricular block, first degree: Secondary | ICD-10-CM | POA: Diagnosis not present

## 2019-10-27 DIAGNOSIS — R42 Dizziness and giddiness: Secondary | ICD-10-CM | POA: Diagnosis not present

## 2019-10-27 DIAGNOSIS — I272 Pulmonary hypertension, unspecified: Secondary | ICD-10-CM | POA: Insufficient documentation

## 2019-10-27 DIAGNOSIS — E11319 Type 2 diabetes mellitus with unspecified diabetic retinopathy without macular edema: Secondary | ICD-10-CM | POA: Insufficient documentation

## 2019-10-27 DIAGNOSIS — Z951 Presence of aortocoronary bypass graft: Secondary | ICD-10-CM | POA: Insufficient documentation

## 2019-10-27 DIAGNOSIS — E161 Other hypoglycemia: Secondary | ICD-10-CM | POA: Diagnosis not present

## 2019-10-27 DIAGNOSIS — I130 Hypertensive heart and chronic kidney disease with heart failure and stage 1 through stage 4 chronic kidney disease, or unspecified chronic kidney disease: Secondary | ICD-10-CM | POA: Insufficient documentation

## 2019-10-27 DIAGNOSIS — E1129 Type 2 diabetes mellitus with other diabetic kidney complication: Secondary | ICD-10-CM | POA: Diagnosis not present

## 2019-10-27 DIAGNOSIS — I509 Heart failure, unspecified: Secondary | ICD-10-CM | POA: Diagnosis not present

## 2019-10-27 DIAGNOSIS — Z95810 Presence of automatic (implantable) cardiac defibrillator: Secondary | ICD-10-CM

## 2019-10-27 DIAGNOSIS — Z7901 Long term (current) use of anticoagulants: Secondary | ICD-10-CM | POA: Insufficient documentation

## 2019-10-27 DIAGNOSIS — E11649 Type 2 diabetes mellitus with hypoglycemia without coma: Secondary | ICD-10-CM | POA: Diagnosis not present

## 2019-10-27 DIAGNOSIS — I4430 Unspecified atrioventricular block: Secondary | ICD-10-CM | POA: Diagnosis not present

## 2019-10-27 LAB — COMPREHENSIVE METABOLIC PANEL
ALT: 21 U/L (ref 0–44)
AST: 25 U/L (ref 15–41)
Albumin: 3 g/dL — ABNORMAL LOW (ref 3.5–5.0)
Alkaline Phosphatase: 67 U/L (ref 38–126)
Anion gap: 10 (ref 5–15)
BUN: 70 mg/dL — ABNORMAL HIGH (ref 8–23)
CO2: 30 mmol/L (ref 22–32)
Calcium: 8.8 mg/dL — ABNORMAL LOW (ref 8.9–10.3)
Chloride: 97 mmol/L — ABNORMAL LOW (ref 98–111)
Creatinine, Ser: 2.84 mg/dL — ABNORMAL HIGH (ref 0.61–1.24)
GFR calc Af Amer: 23 mL/min — ABNORMAL LOW (ref >60)
GFR calc non Af Amer: 20 mL/min — ABNORMAL LOW (ref >60)
Glucose, Bld: 152 mg/dL — ABNORMAL HIGH (ref 70–99)
Potassium: 3.5 mmol/L (ref 3.5–5.1)
Sodium: 137 mmol/L (ref 135–145)
Total Bilirubin: 0.9 mg/dL (ref 0.3–1.2)
Total Protein: 6.3 g/dL — ABNORMAL LOW (ref 6.5–8.1)

## 2019-10-27 LAB — CBC WITH DIFFERENTIAL/PLATELET
Abs Immature Granulocytes: 0.04 10*3/uL (ref 0.00–0.07)
Basophils Absolute: 0 10*3/uL (ref 0.0–0.1)
Basophils Relative: 0 %
Eosinophils Absolute: 0 10*3/uL (ref 0.0–0.5)
Eosinophils Relative: 0 %
HCT: 34.5 % — ABNORMAL LOW (ref 39.0–52.0)
Hemoglobin: 10.9 g/dL — ABNORMAL LOW (ref 13.0–17.0)
Immature Granulocytes: 1 %
Lymphocytes Relative: 7 %
Lymphs Abs: 0.5 10*3/uL — ABNORMAL LOW (ref 0.7–4.0)
MCH: 30.1 pg (ref 26.0–34.0)
MCHC: 31.6 g/dL (ref 30.0–36.0)
MCV: 95.3 fL (ref 80.0–100.0)
Monocytes Absolute: 0.4 10*3/uL (ref 0.1–1.0)
Monocytes Relative: 6 %
Neutro Abs: 6.5 10*3/uL (ref 1.7–7.7)
Neutrophils Relative %: 86 %
Platelets: 188 10*3/uL (ref 150–400)
RBC: 3.62 MIL/uL — ABNORMAL LOW (ref 4.22–5.81)
RDW: 14.6 % (ref 11.5–15.5)
WBC: 7.5 10*3/uL (ref 4.0–10.5)
nRBC: 0 % (ref 0.0–0.2)

## 2019-10-27 LAB — TROPONIN I (HIGH SENSITIVITY)
Troponin I (High Sensitivity): 27 ng/L — ABNORMAL HIGH (ref <18)
Troponin I (High Sensitivity): 30 ng/L — ABNORMAL HIGH (ref <18)

## 2019-10-27 LAB — MAGNESIUM: Magnesium: 2.5 mg/dL — ABNORMAL HIGH (ref 1.7–2.4)

## 2019-10-27 LAB — CBG MONITORING, ED: Glucose-Capillary: 101 mg/dL — ABNORMAL HIGH (ref 70–99)

## 2019-10-27 LAB — LIPASE, BLOOD: Lipase: 24 U/L (ref 11–51)

## 2019-10-27 NOTE — ED Triage Notes (Signed)
Patient arrives to ED with complaints of dizziness after waking up this morning. Pt was to weak and dizzy to get out of bed and walk around. EMS was called and found patient to be in a paced rhythm with HR at 40 and patients blood glucose to be 46. EMS gave patient 0.5mg  Atropine and 25G D10.

## 2019-10-27 NOTE — Discharge Instructions (Addendum)
You were evaluated in the Emergency Department and after careful evaluation, we did not find any emergent condition requiring admission or further testing in the hospital.  Your exam/testing today was overall reassuring.  Your symptoms seem to be due to low blood sugar.  Please return to the Emergency Department if you experience any worsening of your condition.  Thank you for allowing [place] to be a part of your care.

## 2019-10-27 NOTE — ED Provider Notes (Signed)
  Provider Note MRN:  [id number]  Arrival date & time: 10/27/19    ED Course and Medical Decision Making  Assumed care from Dr. Langston Masker at shift change.  Hypoglycemia at home, doing better, workup reassuring, awaiting second trop anticipating discharge.  Second troponin flat, appropriate for discharge.  Procedures  Final Clinical Impressions(s) / ED Diagnoses     ICD-10-CM   1. Hypoglycemia  E16.2     ED Discharge Orders    None        Discharge Instructions     You were evaluated in the Emergency Department and after careful evaluation, we did not find any emergent condition requiring admission or further testing in the hospital.  Your exam/testing today was overall reassuring.  Your symptoms seem to be due to low blood sugar.  Please return to the Emergency Department if you experience any worsening of your condition.  Thank you for allowing [place] to be a part of your care.      Barth Kirks. Sedonia Small, [place] [email or web address]    Maudie Flakes, MD 10/27/19 [phone]

## 2019-10-27 NOTE — ED Notes (Signed)
Patient verbalizes understanding of discharge instructions. Opportunity for questioning and answers were provided. Armband removed by staff, pt discharged from ED.  

## 2019-10-27 NOTE — ED Provider Notes (Signed)
[facility] EMERGENCY DEPARTMENT Provider Note   CSN: [id number] Arrival date & time: 10/27/19  1010     History Chief Complaint  Patient presents with  . Dizziness  . Bradycardia    [name]. is a 84 y.o. male history hypertension, pulmonary hypertension, A. fib, coronary artery disease status post CABG, pacemaker, type 2 diabetes on insulin (12 units tresiba in the morning, started 2 weeks ago), presented emerge department episode of dizziness.  Patient sister reports that the patient woke up today and appeared very dizzy and lethargic in the morning.  He was too weak to get out of bed.  She noted at a point he was diaphoretic.  He seemed confused.  EMS was called out to the scene and that found the patient had a heart rate of 40 and a paced rhythm and had a glucose of 46.  I gave the patient 25 g of D10 as well as 0.5 mg of atropine.  On arrival in the emergency department the patient appears tired but has no acute complaints.  He denies any lightheadedness.  He reports some general fatigue.  Denies any nausea, vomiting, chest pain.  His sister at bedside clarifies to me that he was started on insulin 2 weeks ago for persistent hyperglycemia.  He has been taking his 12 units of insulin in the morning regularly without problems.  She did not give him his insulin today (tresiba).  He has not eaten anything yet today.  He is on eliquis for A Fib  Cards - follows with Dr Darlina Guys, last visit 07/13/19, noted to have hx as below Also follows with Advanced HF clinic Dr Marigene Ehlers   HPI     Past Medical History:  Diagnosis Date  . ANEMIA, HX OF   . Bronchial pneumonia Jan. 2016  . CAD, ARTERY BYPASS GRAFT   . CAROTID ARTERY STENOSIS   . Cataract   . DIABETES MELLITUS, TYPE II   . Diabetic retinal damage of right eye ([place]) 2015  . Diverticulosis   . HYPERCHOLESTEROLEMIA   . HYPERTENSION   . Myocardial infarction [facility]) June 2007  . New onset atrial  fibrillation ([place]) 06/24/2014  . PULMONARY HYPERTENSION   . RENAL INSUFFICIENCY, CHRONIC   . UNSPECIFIED THROMBOCYTOPENIA     Patient Active Problem List   Diagnosis Date Noted  . Acute renal failure (ARF) ([place]) 08/14/2019  . ARF (acute renal failure) ([place]) 08/14/2019  . CHF (congestive heart failure) ([place]) 05/22/2019  . Pleural effusion 10/13/2018  . Dyspnea 06/02/2018  . Ischemic cardiomyopathy 06/11/2017  . Cardiomyopathy- new drop in EF- ? secondary to new AF 06/25/2014  . Coronary artery disease involving coronary bypass graft of native heart with unstable angina pectoris ([place])   . Atrial fibrillation with rapid ventricular response ([place]) 06/24/2014  . UNSPECIFIED THROMBOCYTOPENIA 07/26/2008  . Pulmonary hypertension-PA 51 mmHg  07/26/2008  . Type 2 diabetes mellitus with renal manifestations, controlled ([place]) 07/22/2008  . Dyslipidemia 07/22/2008  . Essential hypertension 07/22/2008  . 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 PVD - moderate bilateral carotid disease 07/22/2008  . Chronic renal insufficiency, stage III (moderate) 07/22/2008  . ANEMIA, HX OF 07/22/2008    Past Surgical History:  Procedure Laterality Date  . CARDIAC CATHETERIZATION  06/24/2014  . CARDIOVERSION [name] 05/11/2019   Procedure: CARDIOVERSION;  Surgeon: Larey Dresser, MD;  Location: [facility] ENDOSCOPY;  Service: Cardiovascular;  Laterality: [name];  . CATARACT EXTRACTION  2012  right eye  . COLONOSCOPY    . CORONARY ARTERY BYPASS GRAFT      quad bypass/2007  . EYE SURGERY    . ICD IMPLANT [name] 06/11/2017   Procedure: ICD IMPLANT;  Surgeon: Constance Haw, MD;  Location: [facility] CV LAB;  Service: Cardiovascular;  Laterality: [name];  . IR THORACENTESIS ASP PLEURAL SPACE W/IMG GUIDE  10/28/2018  . LEFT HEART CATHETERIZATION WITH CORONARY/GRAFT ANGIOGRAM [name] 06/24/2014   Procedure: LEFT HEART CATHETERIZATION WITH Beatrix Fetters;  Surgeon: Burnell Blanks, MD;  Location:  [facility] CATH LAB;  Service: Cardiovascular;  Laterality: [name];  . POLYPECTOMY    . RIGHT HEART CATH [name] 05/22/2019   Procedure: RIGHT HEART CATH;  Surgeon: Larey Dresser, MD;  Location: [place] CV LAB;  Service: Cardiovascular;  Laterality: [name];  . TONSILLECTOMY         Family History  Problem Relation Age of Onset  . Heart disease Father 40       Heart disease before age 44  . Heart attack Father   . Hypertension Father   . Hyperlipidemia Father   . Varicose Veins Father   . Stroke Father        ? not sure  . Colon cancer Maternal Aunt   . Colon cancer Maternal Uncle   . Diverticulitis Other   . Heart attack Other     Social History   Tobacco Use  . Smoking status: Former Smoker    Types: Cigarettes    Quit date: 03/26/1970    Years since quitting: 49.6  . Smokeless tobacco: Never Used  Vaping Use  . Vaping Use: Never used  Substance Use Topics  . Alcohol use: Yes    Alcohol/week: 1.0 standard drink    Types: 1 Glasses of wine per week  . Drug use: No    Home Medications Prior to Admission medications   Medication Sig Start Date End Date Taking? Authorizing Provider  amiodarone (PACERONE) 200 MG tablet TAKE 1 TABLET(200 MG) BY MOUTH DAILY Patient taking differently: Take 200 mg by mouth daily.  10/26/19  Yes Clegg, Amy D, NP  apixaban (ELIQUIS) 2.5 MG TABS tablet Take 1 tablet (2.5 mg total) by mouth 2 (two) times daily. 10/12/19  Yes Burnell Blanks, MD  metoprolol succinate (TOPROL-XL) 25 MG 24 hr tablet Take 0.5 tablets (12.5 mg total) by mouth daily. 07/15/19  Yes Rosita Fire, Brittainy M, PA-C  omeprazole (PRILOSEC) 20 MG capsule Take 20 mg by mouth 2 (two) times daily before a meal.  11/15/10  Yes [provider]  PREVIDENT 5000 BOOSTER PLUS 1.1 % PSTE Place 1 application onto teeth in the morning and at bedtime. 10/09/19  Yes [provider]  simvastatin (ZOCOR) 20 MG tablet Take 20 mg by mouth at bedtime.  10/12/10  Yes [provider]   SSD 1 % cream Apply 1 application topically daily. [id number] 10/21/19  Yes [provider]  torsemide (DEMADEX) 20 MG tablet Take 20-40 mg by mouth See admin instructions. Take 40mg  in the AM and 20mg  in the PM   Yes [provider]  TRESIBA FLEXTOUCH 200 UNIT/ML FlexTouch Pen Inject 12 Units into the skin in the morning. 10/16/19  Yes [provider]    Allergies    Patient has no known allergies.  Review of Systems   Review of Systems  Constitutional: Positive for fatigue. Negative for chills and fever.  HENT: Negative for ear pain and sore throat.   Eyes: Negative  for pain and visual disturbance.  Respiratory: Negative for cough and shortness of breath.   Cardiovascular: Negative for chest pain and palpitations.  Gastrointestinal: Negative for abdominal pain and vomiting.  Genitourinary: Negative for dysuria and hematuria.  Musculoskeletal: Negative for arthralgias and back pain.  Skin: Negative for color change and rash.  Neurological: Positive for dizziness, weakness and light-headedness.  Psychiatric/Behavioral: Negative for agitation and confusion.  All other systems reviewed and are negative.   Physical Exam Updated Vital Signs BP (!) 151/90 (BP Location: Right Arm)   Pulse (!) 41   Temp (!) 97.3 F (36.3 C) (Oral)   Resp 15   Ht 5\' 11"  (1.803 m)   Wt 70.3 kg   SpO2 100%   BMI 21.62 kg/m   Physical Exam Vitals and nursing note reviewed.  Constitutional:      Appearance: He is well-developed.  HENT:     Head: Normocephalic and atraumatic.  Eyes:     Conjunctiva/sclera: Conjunctivae normal.  Cardiovascular:     Rate and Rhythm: Regular rhythm. Bradycardia present.     Comments: HR 40 and paced, ranging to 50 bpm  Pulmonary:     Effort: Pulmonary effort is normal. No respiratory distress.     Breath sounds: Normal breath sounds.  Abdominal:     General: There is no distension.     Palpations: Abdomen is soft.     Tenderness: There is  no abdominal tenderness. There is no guarding.  Musculoskeletal:     Cervical back: Neck supple.  Skin:    General: Skin is warm and dry.  Neurological:     General: No focal deficit present.     Mental Status: He is alert and oriented to person, place, and time. Mental status is at baseline.     Cranial Nerves: No cranial nerve deficit.     Sensory: No sensory deficit.     Motor: No weakness.  Psychiatric:        Mood and Affect: Mood normal.        Behavior: Behavior normal.     ED Results / Procedures / Treatments   Labs (all labs ordered are listed, but only abnormal results are displayed) Labs Reviewed  COMPREHENSIVE METABOLIC PANEL - Abnormal; Notable for the following components:      Result Value   Chloride 97 (*)    Glucose, Bld 152 (*)    BUN 70 (*)    Creatinine, Ser 2.84 (*)    Calcium 8.8 (*)    Total Protein 6.3 (*)    Albumin 3.0 (*)    GFR calc non Af Amer 20 (*)    GFR calc Af Amer 23 (*)    All other components within normal limits  CBC WITH DIFFERENTIAL/PLATELET - Abnormal; Notable for the following components:   RBC 3.62 (*)    Hemoglobin 10.9 (*)    HCT 34.5 (*)    Lymphs Abs 0.5 (*)    All other components within normal limits  MAGNESIUM - Abnormal; Notable for the following components:   Magnesium 2.5 (*)    All other components within normal limits  CBG MONITORING, ED - Abnormal; Notable for the following components:   Glucose-Capillary 101 (*)    All other components within normal limits  TROPONIN I (HIGH SENSITIVITY) - Abnormal; Notable for the following components:   Troponin I (High Sensitivity) 27 (*)    All other components within normal limits  TROPONIN I (HIGH SENSITIVITY) - Abnormal; Notable for  the following components:   Troponin I (High Sensitivity) 30 (*)    All other components within normal limits  LIPASE, BLOOD  URINALYSIS, ROUTINE W REFLEX MICROSCOPIC    EKG EKG Interpretation  Date/Time:  Tuesday October 27 2019  14:45:49 EDT Ventricular Rate:  41 PR Interval:    QRS Duration: 133 QT Interval:  640 QTC Calculation: 529 R Axis:   -88 Text Interpretation: Sinus bradycardia Prolonged PR interval Nonspecific IVCD with LAD Borderline T abnormalities, anterior leads Confirmed by Octaviano Glow [phone]) on 10/27/2019 5:23:10 PM   Radiology DG Chest Portable 1 View  Result Date: 10/27/2019 CLINICAL DATA:  Near syncope EXAM: PORTABLE CHEST 1 VIEW COMPARISON:  Aug 14, 2019 and May 22, 2019 FINDINGS: There is stable scarring in the right base with questionable loculated pleural effusion in this area, stable. Lungs elsewhere are clear. Heart is mildly enlarged with pulmonary vascularity normal. Patient is status post coronary artery bypass grafting. Pacemaker lead attached to right ventricle. There is aortic atherosclerosis. No adenopathy. No bone lesions. IMPRESSION: Scarring right base with questionable chronic loculated pleural effusion in this area, stable. Lungs elsewhere clear. Stable cardiac prominence. Postoperative changes noted. Aortic Atherosclerosis (ICD10-I70.0). Electronically Signed   By: Lowella Grip III M.D.   On: 10/27/2019 12:19    Procedures Procedures (including critical care time)  Medications Ordered in ED Medications - No data to display  ED Course  I have reviewed the triage vital signs and the nursing notes.  Pertinent labs & imaging results that were available during my care of the patient were reviewed by me and considered in my medical decision making (see chart for details).  84 yo male presenting with episode of sweating, confusion at home  DDx includes symptomatic hypoglycemia vs anemia vs arrhythmia vs ACS vs other  He is clinically improved after EMS gave D10.  I gave him a sandwich and food on arrival.  My primary suspicion is for low BS at home, in the setting of being started on tresaba insulin (12 units) about 2 weeks ago by his doctor.  He did not take his  morning dose, but did not eat yet this morning.  I suspect his sugars dropped overnight.  Labs in the ED showed no significant changes from baseline.  Hgb 10.9, BUN 70 and Cr 2.84 (chronic for him).  Initial trop 27, repeat pending.  This may be chronically elevated due to CKD.    Tele shows HR 40 and paced while sleeping here, up to 50's while awake.  ECG here showed NSR, but EMS had him paced at 40 bpm.  I doubt he had symptomatic bradycardia - I suspect his pacemaker has maintained his HR above 40.  We interrogated the pacemaker and [employer] representative reported no cardiac events.  I personally reviewed his medical records and obtained additional history from his sister present at bedside.   Clinical Course as of Oct 26 1721  Tue Oct 27, 2019  1435 Cr near recent baseline, BUN also at baseline.  Glucose 152.  Pacemaker interrogated with no reported arrhythmias since last interrogation.  Patient now sleeping with HR 40 and paced.  Awaiting troponin level.   [MT]  1454 Trop 27 in setting of CKD, will need 2nd level here.   [MT]  1553 Signed out to Dr Sedonia Small pending 2nd troponin level, will try to ambulate patient.  I strongly suspect this was an episode of sympatomic hypoglycemia, advised he keeps juice at bedside and continue his current  regimen of insulin.  If sugars drop again, I asked his sister to immediately check a sugar and give juice if needed.  They verbalized understanding.   [MT]    Clinical Course User Index [MT] Marlen Mollica, Carola Rhine, MD   Final Clinical Impression(s) / ED Diagnoses Final diagnoses:  None    Rx / DC Orders ED Discharge Orders    None       Taesean Reth, Carola Rhine, MD 10/27/19 1723

## 2019-10-29 DIAGNOSIS — I10 Essential (primary) hypertension: Secondary | ICD-10-CM | POA: Diagnosis not present

## 2019-10-29 DIAGNOSIS — I2510 Atherosclerotic heart disease of native coronary artery without angina pectoris: Secondary | ICD-10-CM | POA: Diagnosis not present

## 2019-10-29 DIAGNOSIS — D649 Anemia, unspecified: Secondary | ICD-10-CM | POA: Diagnosis not present

## 2019-10-29 DIAGNOSIS — E1122 Type 2 diabetes mellitus with diabetic chronic kidney disease: Secondary | ICD-10-CM | POA: Diagnosis not present

## 2019-10-29 DIAGNOSIS — I480 Paroxysmal atrial fibrillation: Secondary | ICD-10-CM | POA: Diagnosis not present

## 2019-10-29 DIAGNOSIS — E782 Mixed hyperlipidemia: Secondary | ICD-10-CM | POA: Diagnosis not present

## 2019-10-29 DIAGNOSIS — N183 Chronic kidney disease, stage 3 unspecified: Secondary | ICD-10-CM | POA: Diagnosis not present

## 2019-10-29 DIAGNOSIS — I129 Hypertensive chronic kidney disease with stage 1 through stage 4 chronic kidney disease, or unspecified chronic kidney disease: Secondary | ICD-10-CM | POA: Diagnosis not present

## 2019-10-29 DIAGNOSIS — K219 Gastro-esophageal reflux disease without esophagitis: Secondary | ICD-10-CM | POA: Diagnosis not present

## 2019-10-30 NOTE — Progress Notes (Signed)
EPIC Encounter for ICM Monitoring  Patient Name: [name]. is a 84 y.o. male Date: 10/30/2019 Primary Care Physican: Shirline Frees, MD Primary Cardiologist:McLean Electrophysiologist:Camnitz 10/16/2019 Weight:242lbs   Spoke with patient.  He reports he is feeling well at this time.  In the process of moving into [place].   CorVue thoracic impedance returned to normal since 10/14/2019 remote transmission.   Prescribed: Torsemide20 mg take 2 tablets (40 mg total) every AM and 20 mg every PM.   Recommendations:No changes and encouraged to call if experiencing any fluid symptoms.  Follow-up plan: ICM clinic phone appointment on8/23/2021. 91 day device clinic remote transmission 12/15/2019.   EP/Cardiology Office Visits:11/26/2019 withAdvanced HF clinicPA/NP.   Copy of ICM check sent to [place]  3 month ICM trend: 10/27/2019    1 Year ICM trend:       Rosalene Billings, RN 10/30/2019 3:57 PM

## 2019-11-03 DIAGNOSIS — R809 Proteinuria, unspecified: Secondary | ICD-10-CM | POA: Diagnosis not present

## 2019-11-03 DIAGNOSIS — I5022 Chronic systolic (congestive) heart failure: Secondary | ICD-10-CM | POA: Diagnosis not present

## 2019-11-03 DIAGNOSIS — I2510 Atherosclerotic heart disease of native coronary artery without angina pectoris: Secondary | ICD-10-CM | POA: Diagnosis not present

## 2019-11-03 DIAGNOSIS — D631 Anemia in chronic kidney disease: Secondary | ICD-10-CM | POA: Diagnosis not present

## 2019-11-03 DIAGNOSIS — N179 Acute kidney failure, unspecified: Secondary | ICD-10-CM | POA: Diagnosis not present

## 2019-11-03 DIAGNOSIS — N184 Chronic kidney disease, stage 4 (severe): Secondary | ICD-10-CM | POA: Diagnosis not present

## 2019-11-03 DIAGNOSIS — E1122 Type 2 diabetes mellitus with diabetic chronic kidney disease: Secondary | ICD-10-CM | POA: Diagnosis not present

## 2019-11-03 DIAGNOSIS — I129 Hypertensive chronic kidney disease with stage 1 through stage 4 chronic kidney disease, or unspecified chronic kidney disease: Secondary | ICD-10-CM | POA: Diagnosis not present

## 2019-11-10 ENCOUNTER — Other Ambulatory Visit (HOSPITAL_COMMUNITY): Payer: Self-pay | Admitting: *Deleted

## 2019-11-10 NOTE — Discharge Instructions (Signed)

## 2019-11-11 ENCOUNTER — Inpatient Hospital Stay (HOSPITAL_COMMUNITY)
Admission: RE | Admit: 2019-11-11 | Discharge: 2019-11-11 | Disposition: A | Discharge status: Home / Self Care | Payer: PPO | Source: Ambulatory Visit | Attending: Nephrology | Admitting: Nephrology

## 2019-11-11 ENCOUNTER — Encounter (HOSPITAL_COMMUNITY): Payer: Self-pay

## 2019-11-13 DIAGNOSIS — I429 Cardiomyopathy, unspecified: Secondary | ICD-10-CM | POA: Diagnosis not present

## 2019-11-16 ENCOUNTER — Ambulatory Visit (INDEPENDENT_AMBULATORY_CARE_PROVIDER_SITE_OTHER): Payer: PPO

## 2019-11-16 DIAGNOSIS — Z95810 Presence of automatic (implantable) cardiac defibrillator: Secondary | ICD-10-CM

## 2019-11-16 DIAGNOSIS — I5022 Chronic systolic (congestive) heart failure: Secondary | ICD-10-CM

## 2019-11-18 ENCOUNTER — Encounter (HOSPITAL_COMMUNITY): Payer: PPO

## 2019-11-18 NOTE — Progress Notes (Signed)
EPIC Encounter for ICM Monitoring  Patient Name: [name]. is a 84 y.o. male Date: 11/18/2019 Primary Care Physican: Shirline Frees, MD Primary Cardiologist:McLean Electrophysiologist:Camnitz 11/18/2019 Weight:142lbs   Spoke with patient and daughter [name]. He reports he is feeling well at this time.  He will be moving into [place] in October.  Daughter reports patient is working with dietician to help manage his diabetes diet and encouraged to have dietitian to limit salt intake to 2000 mg daily if possible.  Daughter states patient occasionally has sores that open on his legs and PCP provided cream. His legs are not edematous.  Advised to discuss with at 9/2 Advanced HF clinic appointment and explained the sores could be related to a circulation problem.   CorVue thoracic impedance normal but was suggesting possible fluid accumulation from 7/21 - 8/17.  Prescribed: Torsemide20 mg take 2 tablets (40 mg total) every AM and 20 mg every PM.   Labs: 10/27/2019 Creatinine 2.84, BUN 70,   Potassium 3.5, Sodium 137, GFR 20-23 10/05/2019 Creatinine 3.09, BUN 62,   Potassium 4.2, Sodium 130, GFR 18-21  09/03/2019 Creatinine 3.32, BUN 63,   Potassium 4.0, Sodium 138, GFR 16-19  08/16/2019 Creatinine 3.48, BUN 71,   Potassium 4.0, Sodium 140, GFR 16-18  08/15/2019 Creatinine 4.57, BUN 94,   Potassium 4.1, Sodium 138, GFR 11-13  08/14/2019 Creatinine 5.11, BUN 105, Potassium 4.1, Sodium 133, GFR 10-11  08/13/2019 Creatinine 5.10, BUN 111, Potassium 3.9, Sodium 136, GFR 10-11  A complete set of results can be found in Results Review.  Recommendations:Recommendation to limit salt intake to 2000 mg daily.  Encouraged to call if experiencing any fluid symptoms.   Follow-up plan: ICM clinic phone appointment on9/27/2021. 91 day device clinic remote transmission 12/15/2019.   EP/Cardiology  Office Visits:11/26/2019 withAdvanced HF clinicPA/NP. Recall for 07/07/2020 with Dr Angelena Form.  Message sent to EP scheduler to contact patient to schedule appointment with Dr Curt Bears (last OV was 09/13/2017)  Copy of ICM check sent to [facility]   3 month ICM trend: 11/16/2019    1 Year ICM trend:       Rosalene Billings, RN 11/18/2019 11:11 AM

## 2019-11-26 ENCOUNTER — Encounter (HOSPITAL_COMMUNITY): Payer: Self-pay

## 2019-11-26 ENCOUNTER — Other Ambulatory Visit: Payer: Self-pay

## 2019-11-26 ENCOUNTER — Ambulatory Visit (HOSPITAL_COMMUNITY)
Admission: RE | Admit: 2019-11-26 | Discharge: 2019-11-26 | Disposition: A | Discharge status: Home / Self Care | Payer: PPO | Source: Ambulatory Visit | Attending: Cardiology | Admitting: Cardiology

## 2019-11-26 VITALS — BP 104/58 | HR 45 | Wt 159.0 lb

## 2019-11-26 DIAGNOSIS — I5022 Chronic systolic (congestive) heart failure: Secondary | ICD-10-CM | POA: Diagnosis not present

## 2019-11-26 DIAGNOSIS — J90 Pleural effusion, not elsewhere classified: Secondary | ICD-10-CM | POA: Insufficient documentation

## 2019-11-26 DIAGNOSIS — I130 Hypertensive heart and chronic kidney disease with heart failure and stage 1 through stage 4 chronic kidney disease, or unspecified chronic kidney disease: Secondary | ICD-10-CM | POA: Diagnosis not present

## 2019-11-26 DIAGNOSIS — N184 Chronic kidney disease, stage 4 (severe): Secondary | ICD-10-CM | POA: Diagnosis not present

## 2019-11-26 DIAGNOSIS — Z79899 Other long term (current) drug therapy: Secondary | ICD-10-CM | POA: Insufficient documentation

## 2019-11-26 DIAGNOSIS — I6529 Occlusion and stenosis of unspecified carotid artery: Secondary | ICD-10-CM

## 2019-11-26 DIAGNOSIS — Z794 Long term (current) use of insulin: Secondary | ICD-10-CM | POA: Diagnosis not present

## 2019-11-26 DIAGNOSIS — I6523 Occlusion and stenosis of bilateral carotid arteries: Secondary | ICD-10-CM | POA: Diagnosis not present

## 2019-11-26 DIAGNOSIS — I480 Paroxysmal atrial fibrillation: Secondary | ICD-10-CM | POA: Insufficient documentation

## 2019-11-26 DIAGNOSIS — Z951 Presence of aortocoronary bypass graft: Secondary | ICD-10-CM | POA: Insufficient documentation

## 2019-11-26 DIAGNOSIS — Z87891 Personal history of nicotine dependence: Secondary | ICD-10-CM | POA: Insufficient documentation

## 2019-11-26 DIAGNOSIS — I2510 Atherosclerotic heart disease of native coronary artery without angina pectoris: Secondary | ICD-10-CM

## 2019-11-26 DIAGNOSIS — Z95810 Presence of automatic (implantable) cardiac defibrillator: Secondary | ICD-10-CM | POA: Diagnosis not present

## 2019-11-26 DIAGNOSIS — E785 Hyperlipidemia, unspecified: Secondary | ICD-10-CM | POA: Insufficient documentation

## 2019-11-26 DIAGNOSIS — I440 Atrioventricular block, first degree: Secondary | ICD-10-CM | POA: Insufficient documentation

## 2019-11-26 DIAGNOSIS — R001 Bradycardia, unspecified: Secondary | ICD-10-CM

## 2019-11-26 DIAGNOSIS — Z7901 Long term (current) use of anticoagulants: Secondary | ICD-10-CM | POA: Diagnosis not present

## 2019-11-26 DIAGNOSIS — E1122 Type 2 diabetes mellitus with diabetic chronic kidney disease: Secondary | ICD-10-CM | POA: Insufficient documentation

## 2019-11-26 DIAGNOSIS — I255 Ischemic cardiomyopathy: Secondary | ICD-10-CM | POA: Insufficient documentation

## 2019-11-26 LAB — CBC
HCT: 30 % — ABNORMAL LOW (ref 39.0–52.0)
Hemoglobin: 9.2 g/dL — ABNORMAL LOW (ref 13.0–17.0)
MCH: 28.5 pg (ref 26.0–34.0)
MCHC: 30.7 g/dL (ref 30.0–36.0)
MCV: 92.9 fL (ref 80.0–100.0)
Platelets: 200 10*3/uL (ref 150–400)
RBC: 3.23 MIL/uL — ABNORMAL LOW (ref 4.22–5.81)
RDW: 14.8 % (ref 11.5–15.5)
WBC: 6.8 10*3/uL (ref 4.0–10.5)
nRBC: 0 % (ref 0.0–0.2)

## 2019-11-26 LAB — BASIC METABOLIC PANEL
Anion gap: 13 (ref 5–15)
BUN: 70 mg/dL — ABNORMAL HIGH (ref 8–23)
CO2: 29 mmol/L (ref 22–32)
Calcium: 8.4 mg/dL — ABNORMAL LOW (ref 8.9–10.3)
Chloride: 93 mmol/L — ABNORMAL LOW (ref 98–111)
Creatinine, Ser: 2.98 mg/dL — ABNORMAL HIGH (ref 0.61–1.24)
GFR calc Af Amer: 22 mL/min — ABNORMAL LOW (ref >60)
GFR calc non Af Amer: 19 mL/min — ABNORMAL LOW (ref >60)
Glucose, Bld: 157 mg/dL — ABNORMAL HIGH (ref 70–99)
Potassium: 3.7 mmol/L (ref 3.5–5.1)
Sodium: 135 mmol/L (ref 135–145)

## 2019-11-26 LAB — MAGNESIUM: Magnesium: 2.5 mg/dL — ABNORMAL HIGH (ref 1.7–2.4)

## 2019-11-26 LAB — BRAIN NATRIURETIC PEPTIDE: B Natriuretic Peptide: 1752.4 pg/mL — ABNORMAL HIGH (ref 0.0–100.0)

## 2019-11-26 MED ORDER — AMIODARONE HCL 200 MG PO TABS: 100.0000 mg | ORAL_TABLET | Freq: Every day | ORAL | 3 refills | Status: AC

## 2019-11-26 NOTE — Patient Instructions (Signed)
Stop Metoprolol  Decrease Amiodarone to 100 mg (1/2 tab) Daily  Labs done today, your results will be available in MyChart, we will contact you for abnormal readings.  Your physician has requested that you have a carotid duplex. This test is an ultrasound of the carotid arteries in your neck. It looks at blood flow through these arteries that supply the brain with blood. Allow one hour for this exam. There are no restrictions or special instructions. [facility] WILL CALL YOU TO SCHEDULE THIS  Your physician recommends that you schedule a follow-up appointment in: 3 months with Dr Aundra Dubin  If you have any questions or concerns before your next appointment please send [place] a message through [facility] or call our office at [phone].    TO LEAVE A MESSAGE FOR THE NURSE SELECT OPTION 2, PLEASE LEAVE A MESSAGE INCLUDING: . YOUR NAME . DATE OF BIRTH . CALL BACK NUMBER . REASON FOR CALL**this is important as we prioritize the call backs  [place] AS LONG AS YOU CALL BEFORE 4:00 PM  At the [facility], you and your health needs are our priority. As part of our continuing mission to provide you with exceptional heart care, we have created designated Provider Care Teams. These Care Teams include your primary Cardiologist (physician) and Advanced Practice Providers (APPs- Physician Assistants and Nurse Practitioners) who all work together to provide you with the care you need, when you need it.   You may see any of the following providers on your designated Care Team at your next follow up: [name] Dr Glori Bickers . Dr [name] . Darrick Grinder, NP . Lyda Jester, PA . Audry Riles, PharmD   Please be sure to bring in all your medications bottles to every appointment.

## 2019-11-26 NOTE — Progress Notes (Signed)
PCP: Shirline Frees, MD Cardiology: Dr. Angelena Form HF Cardiology: Dr. Aundra Dubin  84 y.o. with h/o CAD s/p CABG, ischemic cardiomyopathy, and diabetes was referred by Dr. Angelena Form for evaluation of CHF.  Patient had CABG in 2007.  Last cath in 2016 showed all 4 grafts patent.  Echo was 25-30% in 2016.  Most recent echo in 8/20 showed EF 20-25% with moderate RV dysfunction.  He has had a recurrent right pleural effusion with thoracenteses in 5/19 and 8/20.  He thinks that he has been in atrial fibrillation persistently for about a year.  He used to take amiodarone but stopped it because he "felt bad."  However, he does not think that stopping amiodarone made him feel any better.  Creatinine has been elevated, CKD stage 3.   At a prior appt, I thought he was volume overloaded and increased Lasix, but his creatinine steadily rose (2.55 => 3.13 => 3.72). I stopped Entresto and he never started spironolactone.  Lasix was cut back to 60 mg daily. He says that during this time, he got his COVID vaccination and after that, he became severely nauseated for a number of days and ate/drank very little.  This eventually resolved.   On 05/11/19, I cardioverted him back to NSR.    Later in 2/21, he had RHC.  This showed elevated right and left heart filling pressures with low but not markedly low cardiac output.  He was admitted for IV diuresis.  While diuresing, he was maintained on milrinone.  This was weaned prior to discharge. PYP scan done in the hospital was equivocal for ATTR amyloidosis. He was discharged on torsemide.   He was seen by Darrick Grinder in 5/21 with significant weight loss.  He was thought to be volume depleted/dehydrated and creatinine was > 5.  He got IV fluid in the clinic but was still lightheaded and weak so was admitted.  He got IV fluid in the hospital and torsemide was stopped.  Weight started to rise, so he was started back on lower dose of torsemide, 40 qam/20 qpm.  Had return f/u w/ Dr. Aundra Dubin in  June. Wt stable. No cardiac symptoms. Volume status was ok by CorVue.   Recently seen in [facility] ED 10/27/19 for dizziness and bradycardia. HR was 40. Glucose was 46. He was treated w/ 25 g of D10 as well as 0.5 mg of atropine. Of note, he had just been started on tresaba insulin (12 units) by his PCP. His blood glucose and HR improved. HS troponin 27>>30. Per EDP note, his St Jude device was interrogated and device was reported to be functioning properly (note, his device is programed to pace at 40 bpm). He was monitored in the ED and stabilized. Did not require admission. Insulin regimen reduced. No further hypoglycemic events at home.   He had recent remote device interrogation sent to EP clinic on 8/23. CorVue thoracic impedancewas normal but had suggesting possible fluid accumulation from 7/21 - 8/17.   He returns today for followup of CHF. Here w/ his wife. Feels ok. No significant exertional dyspnea walking on flat surfaces but SOB walking up stairs (bedroom on 2nd level). He notes chronic fatigue. No CP. Denies dizziness, no syncope/ near syncope. He does not appear to be significantly volume overloaded on exam or by CorVue, however his wt is up 8 lb since his last clinic visit in June. His pulse rate is low, in the 40s. EKG shows sinus bradycardia, 46 bpm w/ 1st degree HB and prolonged  QT/QTc 594/519 ms. BP 104/58.      Labs (12/20): K 4.5, creatinine 2.01 => 1.84  Labs (2/21): K 3.9, creatinine 2.55 => 3.13 => 3.72 Labs (3/21): hgb 11.1, K 3.6 => 4.6, creatinine 3.04 => 3.64, myeloma panel negative, urine immunofixation negative. TSH and LFTs normal.  Labs (5/21): K 4, creatinine 5.1 => 3.48 Labs (6/21): K 4.0, creatinine 3.32, hgb 10, TSH 2.18  Labs (7.21): K 4.2, creatinine 3.09, Na 130   Labs (8/21): K 3.5, creatinine 2.84, AST 25, ALT 21    ECG (personally reviewed): Sinus bradycardia 46 bpm, 1st degree AVB prolonged QT/QTc 594/519  St Jude device interrogation: impedence hugging curve  does not appear to be significantly volume overloaded by Corvue. No VT. Pacing set at 40 bpm.    PMH: 1. Type 2 diabetes  2. HTN 3. Hyperlipidemia 4. CAD: CABG x 4 in 2007 with LIMA-LAD, SVG-OM, sequential SVG-PLV/PDA.   - LHC (2016): Patent grafts.  5. Atrial fibrillation: Persistent.  - DCCV to NSR 2/21.  6. CKD stage 4.  7. Carotid stenosis:  - Carotid dopplers (8/20): 60-79% RICA stenosis.  8. Chronic systolic CHF: Ischemic cardiomyopathy.    - Echo (2016): EF 25-30% - Echo (2018): EF 25% - Echo (8/20): EF 20-25%, moderately decreased RV systolic function, severe LAE.  - St Jude ICD.   - RHC (3/21): mean RA 18, PA 71/27, mean PCWP 24, CI 2.16 (thermo), CI 2.2 (Fick), PVR 4.6 WU, PAPi 2.4.  - PYP scan (3/21): grade 1 but H/CL ratio 1.6 => equivocal for TTR amyloidosis. .  - PYP scan (5/21): grade 1, H/CL ratio 1.2 => not suggestive of TTR amyloidosis.  9. Pleural effusion on right: s/p thoracentesis in 5/19 and 8/20.   Social History   Socioeconomic History  . Marital status: Married    Spouse name: Not on file  . Number of children: 2  . Years of education: Not on file  . Highest education level: Not on file  Occupational History  . Occupation: Horticulturist, commercial: RETIRED  Tobacco Use  . Smoking status: Former Smoker    Types: Cigarettes    Quit date: 03/26/1970    Years since quitting: 49.7  . Smokeless tobacco: Never Used  Vaping Use  . Vaping Use: Never used  Substance and Sexual Activity  . Alcohol use: Yes    Alcohol/week: 1.0 standard drink    Types: 1 Glasses of wine per week  . Drug use: No  . Sexual activity: Not on file  Other Topics Concern  . Not on file  Social History Narrative  . Not on file   Social Determinants of Health   Financial Resource Strain:   . Difficulty of Paying Living Expenses: Not on file  Food Insecurity:   . Worried About Charity fundraiser in the Last Year: Not on file  . Ran Out of Food in the Last Year: Not on  file  Transportation Needs: No Transportation Needs  . Lack of Transportation (Medical): No  . Lack of Transportation (Non-Medical): No  Physical Activity: Inactive  . Days of Exercise per Week: 0 days  . Minutes of Exercise per Session: 0 min  Stress:   . Feeling of Stress : Not on file  Social Connections:   . Frequency of Communication with Friends and Family: Not on file  . Frequency of Social Gatherings with Friends and Family: Not on file  . Attends Religious Services: Not on file  .  Active Member of Clubs or Organizations: Not on file  . Attends Archivist Meetings: Not on file  . Marital Status: Not on file  Intimate Partner Violence:   . Fear of Current or Ex-Partner: Not on file  . Emotionally Abused: Not on file  . Physically Abused: Not on file  . Sexually Abused: Not on file   Family History  Problem Relation Age of Onset  . Heart disease Father 31       Heart disease before age 30  . Heart attack Father   . Hypertension Father   . Hyperlipidemia Father   . Varicose Veins Father   . Stroke Father        ? not sure  . Colon cancer Maternal Aunt   . Colon cancer Maternal Uncle   . Diverticulitis Other   . Heart attack Other    ROS: All systems reviewed and negative except as per HPI.   Current Outpatient Medications  Medication Sig Dispense Refill  . amiodarone (PACERONE) 200 MG tablet TAKE 1 TABLET(200 MG) BY MOUTH DAILY (Patient taking differently: Take 200 mg by mouth daily. ) 90 tablet 3  . apixaban (ELIQUIS) 2.5 MG TABS tablet Take 1 tablet (2.5 mg total) by mouth 2 (two) times daily. 180 tablet 1  . metoprolol succinate (TOPROL-XL) 25 MG 24 hr tablet Take 0.5 tablets (12.5 mg total) by mouth daily. 90 tablet 3  . omeprazole (PRILOSEC) 20 MG capsule Take 20 mg by mouth 2 (two) times daily before a meal.     . PREVIDENT 5000 BOOSTER PLUS 1.1 % PSTE Place 1 application onto teeth in the morning and at bedtime.    . simvastatin (ZOCOR) 20 MG tablet  Take 20 mg by mouth at bedtime.     . SSD 1 % cream Apply 1 application topically daily. [id number]    . torsemide (DEMADEX) 20 MG tablet Take 20-40 mg by mouth See admin instructions. Take 40mg  in the AM and 20mg  in the PM    . TRESIBA FLEXTOUCH 200 UNIT/ML FlexTouch Pen Inject 12 Units into the skin in the morning.     No current facility-administered medications for this encounter.   BP (!) 104/58   Pulse (!) 45   Wt 72.1 kg   SpO2 100%   BMI 22.18 kg/m   Wt Readings from Last 3 Encounters:  11/26/19 72.1 kg  10/27/19 70.3 kg  09/03/19 68.2 kg   PHYSICAL EXAM: General:  Elderly but well appearing. No respiratory difficulty HEENT: normal Neck: supple. no JVD. Carotids 2+ bilat; no bruits. No lymphadenopathy or thyromegaly appreciated. Cor: PMI nondisplaced. Regular rhythm, slow rate. No rubs, gallops or murmurs. Lungs: clear Abdomen: soft, nontender, nondistended. No hepatosplenomegaly. No bruits or masses. Good bowel sounds. Extremities: no cyanosis, clubbing, rash, trace bilateral LE edema, varicose veins bilaterally  Neuro: alert & oriented x 3, cranial nerves grossly intact. moves all 4 extremities w/o difficulty. Affect pleasant.    Assessment/Plan: 1. Chronic systolic CHF: Ischemic cardiomyopathy.  Echo in 8/20 with EF 20-25%, moderately decreased RV systolic function.  St Jude ICD.  CHF has been complicated by CKD stage 4 with recent AKI.  RHC in 2/21 showed low but not markedly low cardiac output, he was admitted for diuresis with milrinone.  Milrinone was weaned off.  He does not appear significantly volume overloaded by exam or Corvue, however his wt is up 8 lb since his previous visit in June. NYHA class II-III symptoms, stable.  -  Check BNP today to better gauge fluid status. If elevated, will further titrate torsemide to 40 mg bid. If normal range, will continue 40 qam/20 qpm - He will stay off Entresto and spironolactone with elevated creatinine.  - Check BMP today  -  Stop  blocker due to persistent bradycardia and fatigue (pulse rate in the 40s, sinus)   - Narrow QRS, not candidate for upgrade to CRT.  - PYP scan in 5/21 not suggestive of transthyretin amyloidosis.   2. CAD: S/p CABG 2007, patent grafts on LHC in 2016. No exertional chest pain.  - Given stable CAD and Eliquis use, he is not on ASA.  - Continue statin.  3. Atrial fibrillation: Paroxysmal.  He is in SR/sinus brady today. No Afib detection on device interrogation - Will reduce amiodarone to 100 mg daily given bradycardia and prolonged QT/QTc  - LFTs and TSH checked 6/21 and Ok.  He will need a regular eye exam while on amiodarone.  - he is scheduled for close f/u w/ Dr. Curt Bears in 2 weeks. Will need f/u EKG to reassess QT/QTc 4. Right chronic pleural effusion: Only small effusion on recent CXR.  5. CKD stage 4:   - check BMP today  - followed by nephrology  6. Carotid stenosis: Carotid dopplers 2/21 showed 60-79% Rt ICA stenosis and 1-39% Lt ICA stenosis    - he is asymptomatic  - VVS recommended repeat dopplers in 6 months time (8/21), will repeat study to reassess for disease progression - no ASA w/ Eliquis - continue statin therapy w/ Simvastatin, LDL goal <70. Will check lipid panel  7. Bradycardia - he has had persistent sinus bradycardia, in the 40s. Recent ED visit and required atropine. Pulse rate 45 bpm today. Sinus brady on EKG w/ 1st degree heart block. Has St Jude ICD, pacing set to kick in at 40 bmp. Recent TSH level normal. - he is symptomatic w/ fatigue. No syncope or CP  - stop metoprolol succinate (12.5 mg daily) - reduced amiodarone down to 100 mg daily (also w/ prolonged QT/QTc) - keep f/u w/ EP in 2 weeks, will need repeat EKG   F/u w/ Dr. Aundra Dubin in 2 months   Lyda Jester, PA-C  11/26/2019

## 2019-11-27 ENCOUNTER — Encounter (HOSPITAL_COMMUNITY): Payer: Self-pay

## 2019-12-15 ENCOUNTER — Ambulatory Visit (INDEPENDENT_AMBULATORY_CARE_PROVIDER_SITE_OTHER): Payer: PPO | Admitting: *Deleted

## 2019-12-15 DIAGNOSIS — I255 Ischemic cardiomyopathy: Secondary | ICD-10-CM | POA: Diagnosis not present

## 2019-12-15 DIAGNOSIS — E113593 Type 2 diabetes mellitus with proliferative diabetic retinopathy without macular edema, bilateral: Secondary | ICD-10-CM | POA: Diagnosis not present

## 2019-12-15 DIAGNOSIS — H3582 Retinal ischemia: Secondary | ICD-10-CM | POA: Diagnosis not present

## 2019-12-15 DIAGNOSIS — H43812 Vitreous degeneration, left eye: Secondary | ICD-10-CM | POA: Diagnosis not present

## 2019-12-15 DIAGNOSIS — H35371 Puckering of macula, right eye: Secondary | ICD-10-CM | POA: Diagnosis not present

## 2019-12-15 LAB — CUP PACEART REMOTE DEVICE CHECK
Battery Remaining Longevity: 81 mo
Battery Remaining Percentage: 79 %
Battery Voltage: 2.99 V
Brady Statistic RV Percent Paced: 1 %
Date Time Interrogation Session: 20210921020017
HighPow Impedance: 68 Ohm
HighPow Impedance: 68 Ohm
Implantable Lead Implant Date: 20190319
Implantable Lead Location: 753860
Implantable Pulse Generator Implant Date: 20190319
Lead Channel Impedance Value: 360 Ohm
Lead Channel Pacing Threshold Amplitude: 0.5 V
Lead Channel Pacing Threshold Pulse Width: 0.5 ms
Lead Channel Sensing Intrinsic Amplitude: 11.6 mV
Lead Channel Setting Pacing Amplitude: 2.5 V
Lead Channel Setting Pacing Pulse Width: 0.5 ms
Lead Channel Setting Sensing Sensitivity: 0.5 mV
Pulse Gen Serial Number: 9811279

## 2019-12-16 NOTE — Progress Notes (Signed)
Remote ICD transmission.   

## 2019-12-17 ENCOUNTER — Encounter: Payer: Self-pay | Admitting: Cardiology

## 2019-12-17 ENCOUNTER — Other Ambulatory Visit: Payer: Self-pay

## 2019-12-17 ENCOUNTER — Ambulatory Visit: Payer: PPO | Admitting: Cardiology

## 2019-12-17 VITALS — BP 132/64 | HR 57 | Ht 71.0 in | Wt 147.0 lb

## 2019-12-17 DIAGNOSIS — I255 Ischemic cardiomyopathy: Secondary | ICD-10-CM

## 2019-12-17 DIAGNOSIS — I429 Cardiomyopathy, unspecified: Secondary | ICD-10-CM | POA: Diagnosis not present

## 2019-12-17 NOTE — Progress Notes (Signed)
Electrophysiology Office Note   Date:  12/17/2019   ID:  [name] Ridges., DOB 03/02/1936, MRN [id number]  PCP:  Shirline Frees, MD  Cardiologist:  Angelena Form Primary Electrophysiologist:  Vallorie Niccoli Meredith Leeds, MD    No chief complaint on file.    History of Present Illness: [name] Stcyr. is a 84 y.o. male who is being seen today for the evaluation of ischemic cardiomyopathy at the request of [name]. Presenting today for electrophysiology evaluation.  Has a history of atrial fibrillation, ischemic cardiomyopathy, diabetes, hypertension, hyperlipidemia, coronary disease status post four-vessel CABG in 2007, carotid artery disease.  He has had a low ejection fractions in 2016.  In 2016 showed severe native vessel disease with 4 patent bypass grafts.  He was started on Eliquis due to atrial fibrillation.  He had a [place] ICD implanted 06/11/2017.     Today, denies symptoms of palpitations, chest pain, shortness of breath, orthopnea, PND, lower extremity edema, claudication, dizziness, presyncope, syncope, bleeding, or neurologic sequela. The patient is tolerating medications without difficulties.  Over the last year he has done well.  He has no chest pain or shortness of breath.  He is able to do all of his daily activities.  He is on Eliquis and has quite a bit of bruising.  Aside from that he has no major complaints.  Past Medical History:  Diagnosis Date  . ANEMIA, HX OF   . Bronchial pneumonia Jan. 2016  . CAD, ARTERY BYPASS GRAFT   . CAROTID ARTERY STENOSIS   . Cataract   . DIABETES MELLITUS, TYPE II   . Diabetic retinal damage of right eye ([place]) 2015  . Diverticulosis   . HYPERCHOLESTEROLEMIA   . HYPERTENSION   . Myocardial infarction [facility]) June 2007  . New onset atrial fibrillation ([place]) 06/24/2014  . PULMONARY HYPERTENSION   . RENAL INSUFFICIENCY, CHRONIC   . UNSPECIFIED THROMBOCYTOPENIA    Past Surgical History:  Procedure Laterality Date  .  CARDIAC CATHETERIZATION  06/24/2014  . CARDIOVERSION [name] 05/11/2019   Procedure: CARDIOVERSION;  Surgeon: Larey Dresser, MD;  Location[facility] ENDOSCOPY;  Service: Cardiovascular;  Laterality: [name];  . CATARACT EXTRACTION  2012   right eye  . COLONOSCOPY    . CORONARY ARTERY BYPASS GRAFT      quad bypass/2007  . EYE SURGERY    . ICD IMPLANT [name] 06/11/2017   Procedure: ICD IMPLANT;  Surgeon: Constance Haw, MD;  Location: [facility] CV LAB;  Service: Cardiovascular;  Laterality: [name];  . IR THORACENTESIS ASP PLEURAL SPACE W/IMG GUIDE  10/28/2018  . LEFT HEART CATHETERIZATION WITH CORONARY/GRAFT ANGIOGRAM [name] 06/24/2014   Procedure: LEFT HEART CATHETERIZATION WITH Beatrix Fetters;  Surgeon: Burnell Blanks, MD;  Location: [facility] CATH LAB;  Service: Cardiovascular;  Laterality: [name];  . POLYPECTOMY    . RIGHT HEART CATH [name] 05/22/2019   Procedure: RIGHT HEART CATH;  Surgeon: Larey Dresser, MD;  Location: [facility] CV LAB;  Service: Cardiovascular;  Laterality: [name];  . TONSILLECTOMY       Current Outpatient Medications  Medication Sig Dispense Refill  . amiodarone (PACERONE) 200 MG tablet Take 0.5 tablets (100 mg total) by mouth daily. 90 tablet 3  . apixaban (ELIQUIS) 2.5 MG TABS tablet Take 1 tablet (2.5 mg total) by mouth 2 (two) times daily. 180 tablet 1  . omeprazole (PRILOSEC) 20 MG capsule Take 20 mg by mouth 2 (two) times daily before a meal.     .  PREVIDENT 5000 BOOSTER PLUS 1.1 % PSTE Place 1 application onto teeth in the morning and at bedtime.    . simvastatin (ZOCOR) 20 MG tablet Take 20 mg by mouth at bedtime.     . SSD 1 % cream Apply 1 application topically daily. [id number]    . torsemide (DEMADEX) 20 MG tablet Take 20-40 mg by mouth See admin instructions. Take 40mg  in the AM and 20mg  in the PM    . TRESIBA FLEXTOUCH 200 UNIT/ML FlexTouch Pen Inject 12 Units into the skin in the morning.     No current facility-administered medications for this visit.     Allergies:   Patient has no known allergies.   Social History:  The patient  reports that he quit smoking about 49 years ago. His smoking use included cigarettes. He has never used smokeless tobacco. He reports current alcohol use of about 1.0 standard drink of alcohol per week. He reports that he does not use drugs.   Family History:  The patient's family history includes Colon cancer in his maternal aunt and maternal uncle; Diverticulitis in an other family member; Heart attack in his father and another family member; Heart disease (age of onset: 77) in his father; Hyperlipidemia in his father; Hypertension in his father; Stroke in his father; Varicose Veins in his father.   ROS:  Please see the history of present illness.   Otherwise, review of systems is positive for none.   All other systems are reviewed and negative.   PHYSICAL EXAM: VS:  BP 132/64   Pulse (!) 57   Ht 5\' 11"  (1.803 m)   Wt 147 lb (66.7 kg)   SpO2 97%   BMI 20.50 kg/m  , BMI Body mass index is 20.5 kg/m. GEN: Well nourished, well developed, in no acute distress  HEENT: normal  Neck: no JVD, carotid bruits, or masses Cardiac: RRR; no murmurs, rubs, or gallops,no edema  Respiratory:  clear to auscultation bilaterally, normal work of breathing GI: soft, nontender, nondistended, + BS MS: no deformity or atrophy  Skin: warm and dry, device site well healed Neuro:  Strength and sensation are intact Psych: euthymic mood, full affect  EKG:  EKG is ordered today. Personal review of the ekg ordered shows sinus rhythm rate 57  Personal review of the device interrogation today. Results in [place]: 03/12/2019: NT-Pro BNP 7,736 09/03/2019: TSH 2.180 10/27/2019: ALT 21 11/26/2019: B Natriuretic Peptide 1,752.4; BUN 70; Creatinine, Ser 2.98; Hemoglobin 9.2; Magnesium 2.5; Platelets 200; Potassium 3.7; Sodium 135    Lipid Panel  No results found for: CHOL, TRIG, HDL, CHOLHDL, VLDL, LDLCALC,  LDLDIRECT   Wt Readings from Last 3 Encounters:  12/17/19 147 lb (66.7 kg)  11/26/19 159 lb (72.1 kg)  10/27/19 155 lb (70.3 kg)      Other studies Reviewed: Additional studies/ records that were reviewed today include: TTE 01/17/17  Review of the above records today demonstrates:  - Left ventricle: Diffuse hypokinesis with septal and apical   akinesis LV apical band no thrombus. The cavity size was severely   dilated. Wall thickness was normal. The estimated ejection   fraction was 25%. Doppler parameters are consistent with both   elevated ventricular end-diastolic filling pressure and elevated   left atrial filling pressure. - Mitral valve: Severely calcified annulus. - Left atrium: The atrium was moderately dilated. - Right atrium: The atrium was moderately dilated. - Atrial septum: No defect or patent foramen ovale was identified. -  Pulmonary arteries: PA peak pressure: 56 mm Hg (S).   ASSESSMENT AND PLAN:  1.  Chronic systolic heart failure due to ischemic cardiomyopathy: Currently on optimal medical therapy.  Status post [place] ICD implanted 06/11/2017.  Device functioning appropriately.  No change.  2.  Coronary artery disease without angina: No current symptoms.  Plan per primary cardiology.  3.  Hypertension: Currently well controlled  4.  Persistent atrial fibrillation: Currently on Toprol-XL and Eliquis.  Amiodarone has been stopped.  Plan for a rate control strategy.  CHA2DS2-VASc of 5.  In sinus rhythm today  Current medicines are reviewed at length with the patient today.   The patient does not have concerns regarding his medicines.  The following changes were made today: None  Labs/ tests ordered today include:  Orders Placed This Encounter  Procedures  . EKG 12-Lead     Disposition:   FU with [place] Gamero 12 months  Signed, Ople Girgis Meredith Leeds, MD  12/17/2019 4:33 PM     [address] [place]  [phone] (office) [phone] (fax)

## 2019-12-21 ENCOUNTER — Ambulatory Visit (INDEPENDENT_AMBULATORY_CARE_PROVIDER_SITE_OTHER): Payer: PPO

## 2019-12-21 DIAGNOSIS — I5022 Chronic systolic (congestive) heart failure: Secondary | ICD-10-CM | POA: Diagnosis not present

## 2019-12-21 DIAGNOSIS — Z95810 Presence of automatic (implantable) cardiac defibrillator: Secondary | ICD-10-CM

## 2019-12-22 DIAGNOSIS — N183 Chronic kidney disease, stage 3 unspecified: Secondary | ICD-10-CM | POA: Diagnosis not present

## 2019-12-22 DIAGNOSIS — E1122 Type 2 diabetes mellitus with diabetic chronic kidney disease: Secondary | ICD-10-CM | POA: Diagnosis not present

## 2019-12-22 DIAGNOSIS — I2510 Atherosclerotic heart disease of native coronary artery without angina pectoris: Secondary | ICD-10-CM | POA: Diagnosis not present

## 2019-12-22 DIAGNOSIS — D649 Anemia, unspecified: Secondary | ICD-10-CM | POA: Diagnosis not present

## 2019-12-22 DIAGNOSIS — E782 Mixed hyperlipidemia: Secondary | ICD-10-CM | POA: Diagnosis not present

## 2019-12-22 DIAGNOSIS — I10 Essential (primary) hypertension: Secondary | ICD-10-CM | POA: Diagnosis not present

## 2019-12-22 DIAGNOSIS — I480 Paroxysmal atrial fibrillation: Secondary | ICD-10-CM | POA: Diagnosis not present

## 2019-12-22 DIAGNOSIS — K219 Gastro-esophageal reflux disease without esophagitis: Secondary | ICD-10-CM | POA: Diagnosis not present

## 2019-12-22 DIAGNOSIS — I129 Hypertensive chronic kidney disease with stage 1 through stage 4 chronic kidney disease, or unspecified chronic kidney disease: Secondary | ICD-10-CM | POA: Diagnosis not present

## 2019-12-23 ENCOUNTER — Telehealth: Payer: Self-pay

## 2019-12-23 NOTE — Telephone Encounter (Signed)
Remote ICM transmission received.  Attempted call to patient regarding ICM remote transmission and left detailed message per DPR.  Advised to return call for any fluid symptoms or questions. Next ICM remote transmission scheduled 01/25/2020.

## 2019-12-23 NOTE — Progress Notes (Signed)
EPIC Encounter for ICM Monitoring  Patient Name: [name]. is a 84 y.o. male Date: 12/23/2019 Primary Care Physican: Shirline Frees, MD Primary Cardiologist:[place] Electrophysiologist:[place] 11/18/2019 Weight:142lbs   Spoke with patient and reports feeling well at this time.  Denies fluid symptoms.    CorVue thoracic impedancenormal but was suggesting possible fluid accumulation from 9/19-9/25.  Prescribed: Torsemide20 mg take 2 tablets (40 mg total) every AM and 20 mg every PM.   Labs: 10/27/2019 Creatinine 2.84, BUN 70,   Potassium 3.5, Sodium 137, GFR 20-23 10/05/2019 Creatinine 3.09, BUN 62,   Potassium 4.2, Sodium 130, GFR 18-21  09/03/2019 Creatinine 3.32, BUN 63,   Potassium 4.0, Sodium 138, GFR 16-19  08/16/2019 Creatinine 3.48, BUN 71,   Potassium 4.0, Sodium 140, GFR 16-18  08/15/2019 Creatinine 4.57, BUN 94,   Potassium 4.1, Sodium 138, GFR 11-13  08/14/2019 Creatinine 5.11, BUN 105, Potassium 4.1, Sodium 133, GFR 10-11  08/13/2019 Creatinine 5.10, BUN 111, Potassium 3.9, Sodium 136, GFR 10-11  A complete set of results can be found in Results Review.  Recommendations:No changes and encouraged to call if experiencing any fluid symptoms.  Follow-up plan: ICM clinic phone appointment on11/03/2019. 91 day device clinic remote transmission 03/16/2020.   EP/Cardiology Office Visits: 03/01/2020 withDr Aundra Dubin. Recall for 07/07/2020 with Dr Angelena Form.  Last appointment with Dr Curt Bears 12/17/19 (no recall for 2022)  Copy of ICM check sent to [place]   3 month ICM trend: 12/21/2019    1 Year ICM trend:       Rosalene Billings, RN 12/23/2019 8:16 AM

## 2020-01-05 ENCOUNTER — Encounter: Payer: PPO | Attending: Family Medicine | Admitting: Skilled Nursing Facility1

## 2020-01-05 ENCOUNTER — Other Ambulatory Visit: Payer: Self-pay

## 2020-01-05 DIAGNOSIS — E1122 Type 2 diabetes mellitus with diabetic chronic kidney disease: Secondary | ICD-10-CM | POA: Diagnosis not present

## 2020-01-05 DIAGNOSIS — N184 Chronic kidney disease, stage 4 (severe): Secondary | ICD-10-CM | POA: Insufficient documentation

## 2020-01-05 NOTE — Progress Notes (Signed)
Pt states he has CHF and kidney disease. Pt states his doctor did talk to him about dialysis but he really does not want to do it. Pt states he weighs daily which sends right to his doctor. Pt states his doctor does not have him on a fluid restriction. Pt states he has really not had any increases in weight. Pts wife states his blood sugars have been so low they needed to go to the doctor. Pts wife states they go to their handicapped son to feed him so he ends up skipping meals. Pts wife states he does not eat with any kind of regularity. Pt states that is true but he also does not have any taste for anything and nothing really tastes good stating actually it has gotten better lately since getting his covid vaccine. Pt states after they looked up on the Internet how he should be eating and what their daughter told them they have been eating he has been avoiding a lot of foods he finds enjoyable which has made everything he is going through much more difficult.  Pt states they checked his blood sugar every morning and if it is over 130 they administer insulin.   Due to pts current medical conditions, age, current nutrient of concern labs actually being on the lower end, joy of food; Dietitian educated pt on adding in foods he enjoys with consistency. Pt currently is not meeting his caloric needs needing to increase his calories per meal and per day.  Pt was simply tickled at the thought of eating foods he enjoys again stating he does not have many other things to find joy.  Dietitian did advise pt if his labs increased out of range they can cut high potassium fruits/veggies to one a day or they can call/email for further guidance.  Appointment was a back and forth conversation with the pt and his wife asking many questions and his wife taking detailed notes.   Labs: Calcium 8.4 GFR 19 Creatinine 2.98  Handouts Given: Detailed Renal MyPlate with ideas   24 hr recall:  Breakfast: bowl of fruit    Lunch: glucerna   Dinner:  Beverages: soft drink, coffee (12 ounces) + half and half, crunches ice all day   Breakfast ideas:    Omelet: 1 pinch of cheese, peppers, mushroom     OR  Cold cereal with milk    OR  [place] Yogurt or almond yogurt + fruit    OR  Oatmeal + rasinins or apples    OR  Nepro low protein mixed into chocolate almond milk (your Glucerna is a great choice as well)    OR  1 pancake + 1 soy sausage patty + 1 egg  Lunch:   Salad: lettuce, tomato, onion, cucumber, peppers, carrots + edamame + croutons or crackers      Try the [employer] soy sausage   If you are not going to eat a meal drink a meal replacement shake

## 2020-01-06 DIAGNOSIS — I480 Paroxysmal atrial fibrillation: Secondary | ICD-10-CM | POA: Diagnosis not present

## 2020-01-06 DIAGNOSIS — F5089 Other specified eating disorder: Secondary | ICD-10-CM | POA: Diagnosis not present

## 2020-01-06 DIAGNOSIS — I429 Cardiomyopathy, unspecified: Secondary | ICD-10-CM | POA: Diagnosis not present

## 2020-01-06 DIAGNOSIS — L98491 Non-pressure chronic ulcer of skin of other sites limited to breakdown of skin: Secondary | ICD-10-CM | POA: Diagnosis not present

## 2020-01-06 DIAGNOSIS — Z23 Encounter for immunization: Secondary | ICD-10-CM | POA: Diagnosis not present

## 2020-01-06 DIAGNOSIS — I2510 Atherosclerotic heart disease of native coronary artery without angina pectoris: Secondary | ICD-10-CM | POA: Diagnosis not present

## 2020-01-06 DIAGNOSIS — E1122 Type 2 diabetes mellitus with diabetic chronic kidney disease: Secondary | ICD-10-CM | POA: Diagnosis not present

## 2020-01-06 DIAGNOSIS — I10 Essential (primary) hypertension: Secondary | ICD-10-CM | POA: Diagnosis not present

## 2020-01-06 DIAGNOSIS — R413 Other amnesia: Secondary | ICD-10-CM | POA: Diagnosis not present

## 2020-01-13 DIAGNOSIS — N184 Chronic kidney disease, stage 4 (severe): Secondary | ICD-10-CM | POA: Diagnosis not present

## 2020-01-20 DIAGNOSIS — I429 Cardiomyopathy, unspecified: Secondary | ICD-10-CM | POA: Diagnosis not present

## 2020-01-20 DIAGNOSIS — N184 Chronic kidney disease, stage 4 (severe): Secondary | ICD-10-CM | POA: Diagnosis not present

## 2020-01-20 DIAGNOSIS — I2510 Atherosclerotic heart disease of native coronary artery without angina pectoris: Secondary | ICD-10-CM | POA: Diagnosis not present

## 2020-01-20 DIAGNOSIS — I129 Hypertensive chronic kidney disease with stage 1 through stage 4 chronic kidney disease, or unspecified chronic kidney disease: Secondary | ICD-10-CM | POA: Diagnosis not present

## 2020-01-20 DIAGNOSIS — I5022 Chronic systolic (congestive) heart failure: Secondary | ICD-10-CM | POA: Diagnosis not present

## 2020-01-20 DIAGNOSIS — E1122 Type 2 diabetes mellitus with diabetic chronic kidney disease: Secondary | ICD-10-CM | POA: Diagnosis not present

## 2020-01-20 DIAGNOSIS — D631 Anemia in chronic kidney disease: Secondary | ICD-10-CM | POA: Diagnosis not present

## 2020-01-20 DIAGNOSIS — R809 Proteinuria, unspecified: Secondary | ICD-10-CM | POA: Diagnosis not present

## 2020-01-21 DIAGNOSIS — I429 Cardiomyopathy, unspecified: Secondary | ICD-10-CM | POA: Diagnosis not present

## 2020-01-22 ENCOUNTER — Other Ambulatory Visit: Payer: Self-pay

## 2020-01-22 ENCOUNTER — Ambulatory Visit (HOSPITAL_COMMUNITY)
Admission: RE | Admit: 2020-01-22 | Discharge: 2020-01-22 | Disposition: A | Discharge status: Home / Self Care | Payer: PPO | Source: Ambulatory Visit | Attending: Vascular Surgery | Admitting: Vascular Surgery

## 2020-01-22 ENCOUNTER — Ambulatory Visit (INDEPENDENT_AMBULATORY_CARE_PROVIDER_SITE_OTHER): Payer: PPO | Admitting: Physician Assistant

## 2020-01-22 VITALS — BP 134/65 | HR 59 | Temp 97.5°F | Resp 18 | Ht 71.0 in | Wt 139.9 lb

## 2020-01-22 DIAGNOSIS — E782 Mixed hyperlipidemia: Secondary | ICD-10-CM | POA: Diagnosis not present

## 2020-01-22 DIAGNOSIS — I6523 Occlusion and stenosis of bilateral carotid arteries: Secondary | ICD-10-CM | POA: Diagnosis not present

## 2020-01-22 DIAGNOSIS — N183 Chronic kidney disease, stage 3 unspecified: Secondary | ICD-10-CM | POA: Diagnosis not present

## 2020-01-22 DIAGNOSIS — D649 Anemia, unspecified: Secondary | ICD-10-CM | POA: Diagnosis not present

## 2020-01-22 DIAGNOSIS — I2510 Atherosclerotic heart disease of native coronary artery without angina pectoris: Secondary | ICD-10-CM | POA: Diagnosis not present

## 2020-01-22 DIAGNOSIS — I480 Paroxysmal atrial fibrillation: Secondary | ICD-10-CM | POA: Diagnosis not present

## 2020-01-22 DIAGNOSIS — I129 Hypertensive chronic kidney disease with stage 1 through stage 4 chronic kidney disease, or unspecified chronic kidney disease: Secondary | ICD-10-CM | POA: Diagnosis not present

## 2020-01-22 DIAGNOSIS — I10 Essential (primary) hypertension: Secondary | ICD-10-CM | POA: Diagnosis not present

## 2020-01-22 DIAGNOSIS — K219 Gastro-esophageal reflux disease without esophagitis: Secondary | ICD-10-CM | POA: Diagnosis not present

## 2020-01-22 DIAGNOSIS — E1122 Type 2 diabetes mellitus with diabetic chronic kidney disease: Secondary | ICD-10-CM | POA: Diagnosis not present

## 2020-01-22 NOTE — Progress Notes (Signed)
Office Note     CC:  follow up Requesting Provider:  Shirline Frees, MD  HPI: [name]. is a 84 y.o. (1935-06-24) male who presents for follow up of carotid stenosis with surveillance carotid duplex. He denies any amaurosis fugax or monocular blindness. He does have diabetic retinopathy in his right eye but this has been present for many years. He denies any facial dropping, slurred speech, weakness or numbness of upper or lower extremities. He says he does have some memory issues but overall thinks it is just related to his age. He was started on Donepezil by his PCP. He also recently was started on daily insulin. Both of these he is tolerating well.   He denies any pain in his legs when ambulating or at rest. He has no nonhealing wounds  His history has been reviewed and there are no new changes in [facility], FH, PMH that are new from his last visit  The pt is on a statin for cholesterol management.  The pt is not on a daily aspirin.   Other AC:  Eliquis The pt is  on BB for hypertension.   The pt is diabetic.  Tobacco hx: Former smoker, quit 1972  Past Medical History:  Diagnosis Date  . ANEMIA, HX OF   . Bronchial pneumonia Jan. 2016  . CAD, ARTERY BYPASS GRAFT   . CAROTID ARTERY STENOSIS   . Cataract   . DIABETES MELLITUS, TYPE II   . Diabetic retinal damage of right eye ([place]) 2015  . Diverticulosis   . HYPERCHOLESTEROLEMIA   . HYPERTENSION   . Myocardial infarction [facility]) June 2007  . New onset atrial fibrillation ([place]) 06/24/2014  . PULMONARY HYPERTENSION   . RENAL INSUFFICIENCY, CHRONIC   . UNSPECIFIED THROMBOCYTOPENIA     Past Surgical History:  Procedure Laterality Date  . CARDIAC CATHETERIZATION  06/24/2014  . CARDIOVERSION [name] 05/11/2019   Procedure: CARDIOVERSION;  Surgeon: Larey Dresser, MD;  Location: [facility] ENDOSCOPY;  Service: Cardiovascular;  Laterality: [name];  . CATARACT EXTRACTION  2012   right eye  . COLONOSCOPY    . CORONARY ARTERY BYPASS GRAFT       quad bypass/2007  . EYE SURGERY    . ICD IMPLANT [name] 06/11/2017   Procedure: ICD IMPLANT;  Surgeon: Constance Haw, MD;  Location: [place] CV LAB;  Service: Cardiovascular;  Laterality: [name];  . IR THORACENTESIS ASP PLEURAL SPACE W/IMG GUIDE  10/28/2018  . LEFT HEART CATHETERIZATION WITH CORONARY/GRAFT ANGIOGRAM [name] 06/24/2014   Procedure: LEFT HEART CATHETERIZATION WITH Beatrix Fetters;  Surgeon: Burnell Blanks, MD;  Location: [facility] CATH LAB;  Service: Cardiovascular;  Laterality: [name];  . POLYPECTOMY    . RIGHT HEART CATH [name] 05/22/2019   Procedure: RIGHT HEART CATH;  Surgeon: Larey Dresser, MD;  Location: [place] CV LAB;  Service: Cardiovascular;  Laterality: [name];  . TONSILLECTOMY      Social History   Socioeconomic History  . Marital status: Married    Spouse name: Not on file  . Number of children: 2  . Years of education: Not on file  . Highest education level: Not on file  Occupational History  . Occupation: Horticulturist, commercial: RETIRED  Tobacco Use  . Smoking status: Former Smoker    Types: Cigarettes    Quit date: 03/26/1970    Years since quitting: 49.8  . Smokeless tobacco: Never Used  Vaping Use  . Vaping Use: Never used  Substance and Sexual  Activity  . Alcohol use: Yes    Alcohol/week: 1.0 standard drink    Types: 1 Glasses of wine per week  . Drug use: No  . Sexual activity: Not on file  Other Topics Concern  . Not on file  Social History Narrative  . Not on file   Social Determinants of Health   Financial Resource Strain:   . Difficulty of Paying Living Expenses: Not on file  Food Insecurity:   . Worried About Charity fundraiser in the Last Year: Not on file  . Ran Out of Food in the Last Year: Not on file  Transportation Needs: No Transportation Needs  . Lack of Transportation (Medical): No  . Lack of Transportation (Non-Medical): No  Physical Activity: Inactive  . Days of Exercise per Week: 0 days  . Minutes of  Exercise per Session: 0 min  Stress:   . Feeling of Stress : Not on file  Social Connections:   . Frequency of Communication with Friends and Family: Not on file  . Frequency of Social Gatherings with Friends and Family: Not on file  . Attends Religious Services: Not on file  . Active Member of Clubs or Organizations: Not on file  . Attends Archivist Meetings: Not on file  . Marital Status: Not on file  Intimate Partner Violence:   . Fear of Current or Ex-Partner: Not on file  . Emotionally Abused: Not on file  . Physically Abused: Not on file  . Sexually Abused: Not on file    Family History  Problem Relation Age of Onset  . Heart disease Father 29       Heart disease before age 97  . Heart attack Father   . Hypertension Father   . Hyperlipidemia Father   . Varicose Veins Father   . Stroke Father        ? not sure  . Colon cancer Maternal Aunt   . Colon cancer Maternal Uncle   . Diverticulitis Other   . Heart attack Other     Current Outpatient Medications  Medication Sig Dispense Refill  . amiodarone (PACERONE) 200 MG tablet Take 0.5 tablets (100 mg total) by mouth daily. 90 tablet 3  . apixaban (ELIQUIS) 2.5 MG TABS tablet Take 1 tablet (2.5 mg total) by mouth 2 (two) times daily. 180 tablet 1  . insulin detemir (LEVEMIR) 100 UNIT/ML injection Inject 6 Units into the skin daily before breakfast.    . omeprazole (PRILOSEC) 20 MG capsule Take 20 mg by mouth 2 (two) times daily before a meal.     . PREVIDENT 5000 BOOSTER PLUS 1.1 % PSTE Place 1 application onto teeth in the morning and at bedtime.    . simvastatin (ZOCOR) 20 MG tablet Take 20 mg by mouth at bedtime.     . SSD 1 % cream Apply 1 application topically daily. [id number]    . torsemide (DEMADEX) 20 MG tablet Take 20-40 mg by mouth See admin instructions. Take 40mg  in the AM and 20mg  in the PM    . TRESIBA FLEXTOUCH 200 UNIT/ML FlexTouch Pen Inject 12 Units into the skin in the morning.    . donepezil  (ARICEPT) 5 MG tablet Take 5 mg by mouth at bedtime.     No current facility-administered medications for this visit.    No Known Allergies   REVIEW OF SYSTEMS:  [X]  denotes positive finding, [ ]  denotes negative finding Cardiac  Comments:  Chest pain or chest  pressure:    Shortness of breath upon exertion:    Short of breath when lying flat:    Irregular heart rhythm:        Vascular    Pain in calf, thigh, or hip brought on by ambulation:    Pain in feet at night that wakes you up from your sleep:     Blood clot in your veins:    Leg swelling:         Pulmonary    Oxygen at home:    Productive cough:     Wheezing:         Neurologic    Sudden weakness in arms or legs:     Sudden numbness in arms or legs:     Sudden onset of difficulty speaking or slurred speech:    Temporary loss of vision in one eye:     Problems with dizziness:         Gastrointestinal    Blood in stool:     Vomited blood:         Genitourinary    Burning when urinating:     Blood in urine:        Psychiatric    Major depression:         Hematologic    Bleeding problems:    Problems with blood clotting too easily:        Skin    Rashes or ulcers:        Constitutional    Fever or chills:      PHYSICAL EXAMINATION:  Vitals:   01/22/20 1251 01/22/20 1254  BP: (!) 144/69 134/65  Pulse: (!) 59 (!) 59  Resp: 18   Temp: (!) 97.5 F (36.4 C)   TempSrc: Temporal   SpO2: 100%   Weight: 139 lb 14.4 oz (63.5 kg)   Height: 5\' 11"  (1.803 m)     General:  WDWN in NAD; vital signs documented above Gait: Normal HENT: WNL, normocephalic Pulmonary: normal non-labored breathing , without wheezing Cardiac: regular HR, without  Murmurs with carotid bruit bilaterally Abdomen: soft, NT, no masses Skin: without rashes, does have significant bruising on hands, arms and legs secondary to his blood thinner Vascular Exam/Pulses:  Right Left  Radial 2+ (normal) 2+ (normal)  Ulnar 2+ (normal) 2+  (normal)  Femoral 2+ (normal) 2+ (normal)  Popliteal 2+ (normal) 2+ (normal)  DP 2+ (normal) 2+ (normal)  PT 1+ (weak) 1+ (weak)   Extremities: without ischemic changes, without Gangrene , without cellulitis; without open wounds;  Musculoskeletal: no muscle wasting or atrophy  Neurologic: A&O X 3;  No focal weakness or paresthesias are detected Psychiatric:  The pt has Normal affect.   Non-Invasive Vascular Imaging:   01/22/20 I have reviewed the carotid duplex ultrasound and he continues to have right ICA stenosis of 60-79% with PSV of 329 and EDV of 72. The PSV has increased since prior study 235. Left ICA stenosis of 1-39%. Bilateral vertebral arteries with antegrade flow and normal flow in bilateral subclavian arteries   ASSESSMENT/PLAN:: 83 y.o. male here for follow up for carotid stenosis. He remains asymptomatic. He currently is on Eliquis and statin. His duplex today shows stable Right ICA stenosis of 60-79% and left <39%. The PSV velocities in the right ICA have increased but the EDV is essentially unchanged. - reviewed signs and symptoms of stroke with the patient and should these occur he knows to seek immediate medical attention -He will follow up again  in 6 months with repeat carotid duplex   Karoline Caldwell, PA-C [facility] [phone]  Clinic MD: Dr. Stanford Breed

## 2020-01-25 ENCOUNTER — Ambulatory Visit (INDEPENDENT_AMBULATORY_CARE_PROVIDER_SITE_OTHER): Payer: PPO

## 2020-01-25 DIAGNOSIS — I5022 Chronic systolic (congestive) heart failure: Secondary | ICD-10-CM | POA: Diagnosis not present

## 2020-01-25 DIAGNOSIS — Z95810 Presence of automatic (implantable) cardiac defibrillator: Secondary | ICD-10-CM | POA: Diagnosis not present

## 2020-01-29 NOTE — Progress Notes (Signed)
EPIC Encounter for ICM Monitoring  Patient Name: [name]. is a 84 y.o. male Date: 01/29/2020 Primary Care Physican: Shirline Frees, MD Primary Cardiologist:McLean Electrophysiologist:Camnitz 01/29/2020 Weight:142lbs   Spoke with patient and reports feeling well at this time.  Denies fluid symptoms.    CorVue thoracic impedancenormal.  Prescribed: Torsemide20 mg take 2 tablets (40 mg total) every AM and 20 mg every PM.   Labs: 10/27/2019 Creatinine2.84, BUN70, Potassium 3.5, [id number], GFR20-23 10/05/2019 Creatinine3.09, BUN62, Potassium 4.2, Sodium130, OZH08-65  09/03/2019 Creatinine3.32, BUN63, Potassium 4.0, HQIONG295, MWU13-24  08/16/2019 Creatinine3.48, BUN71, Potassium 4.0, Sodium140, MWN02-72  08/15/2019 Creatinine4.57, BUN94, Potassium 4.1, Sodium138, ZDG64-40  08/14/2019 Creatinine5.11, BUN105, Potassium4.1, Sodium133, GFR10-11  08/13/2019 Creatinine5.10, BUN111, Potassium3.9, Sodium136, GFR10-11 A complete set of results can be found in Results Review.  Recommendations:No changes and encouraged to call if experiencing any fluid symptoms.  Follow-up plan: ICM clinic phone appointment on12/08/2019. 91 day device clinic remote transmission 03/16/2020.   EP/Cardiology Office Visits: 03/01/2020 withDr Aundra Dubin. Recall for 07/07/2020 with Dr Angelena Form. Last appointment with Dr Curt Bears 12/17/19 (no recall for 2022)  Copy of ICM check sent to [facility]  3 month ICM trend: 01/28/2020       Rosalene Billings, RN 01/29/2020 4:23 PM

## 2020-02-03 DIAGNOSIS — I429 Cardiomyopathy, unspecified: Secondary | ICD-10-CM | POA: Diagnosis not present

## 2020-02-12 DIAGNOSIS — I429 Cardiomyopathy, unspecified: Secondary | ICD-10-CM | POA: Diagnosis not present

## 2020-02-17 DIAGNOSIS — I429 Cardiomyopathy, unspecified: Secondary | ICD-10-CM | POA: Diagnosis not present

## 2020-02-29 ENCOUNTER — Ambulatory Visit (INDEPENDENT_AMBULATORY_CARE_PROVIDER_SITE_OTHER): Payer: PPO

## 2020-02-29 DIAGNOSIS — Z95810 Presence of automatic (implantable) cardiac defibrillator: Secondary | ICD-10-CM | POA: Diagnosis not present

## 2020-02-29 DIAGNOSIS — I5022 Chronic systolic (congestive) heart failure: Secondary | ICD-10-CM

## 2020-03-01 ENCOUNTER — Encounter (HOSPITAL_COMMUNITY): Payer: Self-pay | Admitting: Cardiology

## 2020-03-01 ENCOUNTER — Ambulatory Visit (HOSPITAL_COMMUNITY)
Admission: RE | Admit: 2020-03-01 | Discharge: 2020-03-01 | Disposition: A | Discharge status: Home / Self Care | Payer: PPO | Source: Ambulatory Visit | Attending: Cardiology | Admitting: Cardiology

## 2020-03-01 ENCOUNTER — Other Ambulatory Visit: Payer: Self-pay

## 2020-03-01 VITALS — BP 128/60 | HR 59 | Wt 143.0 lb

## 2020-03-01 DIAGNOSIS — N184 Chronic kidney disease, stage 4 (severe): Secondary | ICD-10-CM | POA: Diagnosis not present

## 2020-03-01 DIAGNOSIS — I6529 Occlusion and stenosis of unspecified carotid artery: Secondary | ICD-10-CM | POA: Diagnosis not present

## 2020-03-01 DIAGNOSIS — I4819 Other persistent atrial fibrillation: Secondary | ICD-10-CM | POA: Diagnosis not present

## 2020-03-01 DIAGNOSIS — Z951 Presence of aortocoronary bypass graft: Secondary | ICD-10-CM | POA: Insufficient documentation

## 2020-03-01 DIAGNOSIS — J90 Pleural effusion, not elsewhere classified: Secondary | ICD-10-CM | POA: Insufficient documentation

## 2020-03-01 DIAGNOSIS — Z7901 Long term (current) use of anticoagulants: Secondary | ICD-10-CM | POA: Insufficient documentation

## 2020-03-01 DIAGNOSIS — I5022 Chronic systolic (congestive) heart failure: Secondary | ICD-10-CM | POA: Diagnosis not present

## 2020-03-01 DIAGNOSIS — I130 Hypertensive heart and chronic kidney disease with heart failure and stage 1 through stage 4 chronic kidney disease, or unspecified chronic kidney disease: Secondary | ICD-10-CM | POA: Diagnosis not present

## 2020-03-01 DIAGNOSIS — Z794 Long term (current) use of insulin: Secondary | ICD-10-CM | POA: Insufficient documentation

## 2020-03-01 DIAGNOSIS — E785 Hyperlipidemia, unspecified: Secondary | ICD-10-CM | POA: Insufficient documentation

## 2020-03-01 DIAGNOSIS — Z79899 Other long term (current) drug therapy: Secondary | ICD-10-CM | POA: Insufficient documentation

## 2020-03-01 DIAGNOSIS — E1122 Type 2 diabetes mellitus with diabetic chronic kidney disease: Secondary | ICD-10-CM | POA: Diagnosis not present

## 2020-03-01 DIAGNOSIS — I255 Ischemic cardiomyopathy: Secondary | ICD-10-CM | POA: Diagnosis not present

## 2020-03-01 DIAGNOSIS — I2510 Atherosclerotic heart disease of native coronary artery without angina pectoris: Secondary | ICD-10-CM | POA: Diagnosis not present

## 2020-03-01 DIAGNOSIS — Z87891 Personal history of nicotine dependence: Secondary | ICD-10-CM | POA: Insufficient documentation

## 2020-03-01 DIAGNOSIS — Z8249 Family history of ischemic heart disease and other diseases of the circulatory system: Secondary | ICD-10-CM | POA: Diagnosis not present

## 2020-03-01 HISTORY — DX: Heart failure, unspecified: I50.9

## 2020-03-01 LAB — COMPREHENSIVE METABOLIC PANEL
ALT: 16 U/L (ref 0–44)
AST: 20 U/L (ref 15–41)
Albumin: 3.5 g/dL (ref 3.5–5.0)
Alkaline Phosphatase: 96 U/L (ref 38–126)
Anion gap: 15 (ref 5–15)
BUN: 48 mg/dL — ABNORMAL HIGH (ref 8–23)
CO2: 32 mmol/L (ref 22–32)
Calcium: 9.2 mg/dL (ref 8.9–10.3)
Chloride: 90 mmol/L — ABNORMAL LOW (ref 98–111)
Creatinine, Ser: 2.69 mg/dL — ABNORMAL HIGH (ref 0.61–1.24)
GFR, Estimated: 23 mL/min — ABNORMAL LOW (ref >60)
Glucose, Bld: 248 mg/dL — ABNORMAL HIGH (ref 70–99)
Potassium: 3.5 mmol/L (ref 3.5–5.1)
Sodium: 137 mmol/L (ref 135–145)
Total Bilirubin: 0.9 mg/dL (ref 0.3–1.2)
Total Protein: 7.2 g/dL (ref 6.5–8.1)

## 2020-03-01 LAB — CBC
HCT: 33.8 % — ABNORMAL LOW (ref 39.0–52.0)
Hemoglobin: 11.3 g/dL — ABNORMAL LOW (ref 13.0–17.0)
MCH: 29.1 pg (ref 26.0–34.0)
MCHC: 33.4 g/dL (ref 30.0–36.0)
MCV: 87.1 fL (ref 80.0–100.0)
Platelets: 238 10*3/uL (ref 150–400)
RBC: 3.88 MIL/uL — ABNORMAL LOW (ref 4.22–5.81)
RDW: 15.7 % — ABNORMAL HIGH (ref 11.5–15.5)
WBC: 7.9 10*3/uL (ref 4.0–10.5)
nRBC: 0 % (ref 0.0–0.2)

## 2020-03-01 LAB — TSH: TSH: 2.593 u[IU]/mL (ref 0.350–4.500)

## 2020-03-01 MED ORDER — VERQUVO 2.5 MG PO TABS
2.5000 mg | ORAL_TABLET | Freq: Every day | ORAL | 3 refills | Status: DC
Start: 2020-03-01 — End: 2020-03-23

## 2020-03-01 NOTE — Patient Instructions (Signed)
START Verquvo 2.5mg  (1 tablet) daily  Labs done today, your results will be available in MyChart, we will contact you for abnormal readings.  Your physician recommends that you schedule a follow-up appointment in: 3 weeks with our Pharmacist  Your physician recommends that you schedule a follow-up appointment in: 2 months with Dr.McLean with an echo  Your physician has requested that you have an echocardiogram. Echocardiography is a painless test that uses sound waves to create images of your heart. It provides your doctor with information about the size and shape of your heart and how well your heart's chambers and valves are working. This procedure takes approximately one hour. There are no restrictions for this procedure.   If you have any questions or concerns before your next appointment please send [place] a message through [facility] or call our office at [phone].    TO LEAVE A MESSAGE FOR THE NURSE SELECT OPTION 2, PLEASE LEAVE A MESSAGE INCLUDING: . YOUR NAME . DATE OF BIRTH . CALL BACK NUMBER . REASON FOR CALL**this is important as we prioritize the call backs  YOU WILL RECEIVE A CALL BACK THE SAME DAY AS LONG AS YOU CALL BEFORE 4:00 PM

## 2020-03-01 NOTE — Progress Notes (Signed)
PCP: Shirline Frees, MD Cardiology: Dr. Angelena Form HF Cardiology: Dr. Aundra Dubin  84 y.o. with h/o CAD s/p CABG, ischemic cardiomyopathy, and diabetes was referred by Dr. Angelena Form for evaluation of CHF.  Patient had CABG in 2007.  Last cath in 2016 showed all 4 grafts patent.  Echo was 25-30% in 2016.  Most recent echo in 8/20 showed EF 20-25% with moderate RV dysfunction.  He has had a recurrent right pleural effusion with thoracenteses in 5/19 and 8/20.  He thinks that he has been in atrial fibrillation persistently for about a year.  He used to take amiodarone but stopped it because he "felt bad."  However, he does not think that stopping amiodarone made him feel any better.  Creatinine has been elevated, CKD stage 3.   At a prior appt, I thought he was volume overloaded and increased Lasix, but his creatinine steadily rose (2.55 => 3.13 => 3.72). I stopped Entresto and he never started spironolactone.  Lasix was cut back to 60 mg daily. He says that during this time, he got his COVID vaccination and after that, he became severely nauseated for a number of days and ate/drank very little.  This eventually resolved.   On 05/11/19, I cardioverted him back to NSR.    Later in 2/21, he had RHC.  This showed elevated right and left heart filling pressures with low but not markedly low cardiac output.  He was admitted for IV diuresis.  While diuresing, he was maintained on milrinone.  This was weaned prior to discharge. PYP scan done in the hospital was equivocal for ATTR amyloidosis. He was discharged on torsemide.   He was seen by Darrick Grinder in 5/21 with significant weight loss.  He was thought to be volume depleted/dehydrated and creatinine was > 5.  He got IV fluid in the clinic but was still lightheaded and weak so was admitted.  He got IV fluid in the hospital and torsemide was stopped.  Weight started to rise, so he was started back on lower dose of torsemide, 40 qam/20 qpm.    He returns today for  followup of CHF.  Weight is down 16 lbs.  He is off beta blocker with bradycardia, HR around 60 today.  Short of breath with moderate exertion but generally no dyspnea walking on flat ground or up 1 flight stairs.  No orthopnea/PND.  No chest pain.  Has started Aricept due to memory issues.  Stamina has been better off beta blocker.  No palpitations, lightheadedness, or syncope.   Labs (12/20): K 4.5, creatinine 2.01 => 1.84  Labs (2/21): K 3.9, creatinine 2.55 => 3.13 => 3.72 Labs (3/21): hgb 11.1, K 3.6 => 4.6, creatinine 3.04 => 3.64, myeloma panel negative, urine immunofixation negative. TSH and LFTs normal.  Labs (5/21): K 4, creatinine 5.1 => 3.48 Labs (9/21): K 3.7, creatinine 2.98, BNP 1752  St Jude device interrogation: No VT, stable thoracic impedance, < 1% V-pacing.   PMH: 1. Type 2 diabetes  2. HTN 3. Hyperlipidemia 4. CAD: CABG x 4 in 2007 with LIMA-LAD, SVG-OM, sequential SVG-PLV/PDA.   - LHC (2016): Patent grafts.  5. Atrial fibrillation: Persistent.  - DCCV to NSR 2/21.  6. CKD stage 4.  7. Carotid stenosis:  - Carotid dopplers (8/20): 60-79% RICA stenosis.  - Carotid dopplers (10/21): 60-79% RICA stenosis.  8. Chronic systolic CHF: Ischemic cardiomyopathy.    - Echo (2016): EF 25-30% - Echo (2018): EF 25% - Echo (8/20): EF 20-25%, moderately decreased RV  systolic function, severe LAE.  - St Jude ICD.   - RHC (3/21): mean RA 18, PA 71/27, mean PCWP 24, CI 2.16 (thermo), CI 2.2 (Fick), PVR 4.6 WU, PAPi 2.4.  - PYP scan (3/21): grade 1 but H/CL ratio 1.6 => equivocal for TTR amyloidosis. .  - PYP scan (5/21): grade 1, H/CL ratio 1.2 => not suggestive of TTR amyloidosis.  9. Pleural effusion on right: s/p thoracentesis in 5/19 and 8/20.   Social History   Socioeconomic History  . Marital status: Married    Spouse name: Not on file  . Number of children: 2  . Years of education: Not on file  . Highest education level: Not on file  Occupational History  .  Occupation: Horticulturist, commercial: RETIRED  Tobacco Use  . Smoking status: Former Smoker    Types: Cigarettes    Quit date: 03/26/1970    Years since quitting: 49.9  . Smokeless tobacco: Never Used  Vaping Use  . Vaping Use: Never used  Substance and Sexual Activity  . Alcohol use: Yes    Alcohol/week: 1.0 standard drink    Types: 1 Glasses of wine per week  . Drug use: No  . Sexual activity: Not on file  Other Topics Concern  . Not on file  Social History Narrative  . Not on file   Social Determinants of Health   Financial Resource Strain:   . Difficulty of Paying Living Expenses: Not on file  Food Insecurity:   . Worried About Charity fundraiser in the Last Year: Not on file  . Ran Out of Food in the Last Year: Not on file  Transportation Needs: No Transportation Needs  . Lack of Transportation (Medical): No  . Lack of Transportation (Non-Medical): No  Physical Activity: Inactive  . Days of Exercise per Week: 0 days  . Minutes of Exercise per Session: 0 min  Stress:   . Feeling of Stress : Not on file  Social Connections:   . Frequency of Communication with Friends and Family: Not on file  . Frequency of Social Gatherings with Friends and Family: Not on file  . Attends Religious Services: Not on file  . Active Member of Clubs or Organizations: Not on file  . Attends Archivist Meetings: Not on file  . Marital Status: Not on file  Intimate Partner Violence:   . Fear of Current or Ex-Partner: Not on file  . Emotionally Abused: Not on file  . Physically Abused: Not on file  . Sexually Abused: Not on file   Family History  Problem Relation Age of Onset  . Heart disease Father 26       Heart disease before age 61  . Heart attack Father   . Hypertension Father   . Hyperlipidemia Father   . Varicose Veins Father   . Stroke Father        ? not sure  . Colon cancer Maternal Aunt   . Colon cancer Maternal Uncle   . Diverticulitis Other   . Heart  attack Other    ROS: All systems reviewed and negative except as per HPI.   Current Outpatient Medications  Medication Sig Dispense Refill  . amiodarone (PACERONE) 200 MG tablet Take 0.5 tablets (100 mg total) by mouth daily. 90 tablet 3  . apixaban (ELIQUIS) 2.5 MG TABS tablet Take 1 tablet (2.5 mg total) by mouth 2 (two) times daily. 180 tablet 1  . donepezil (ARICEPT) 5  MG tablet Take 5 mg by mouth at bedtime.    . insulin degludec (TRESIBA FLEXTOUCH) 200 UNIT/ML FlexTouch Pen Inject 6 Units into the skin daily.    [name] omeprazole (PRILOSEC) 20 MG capsule Take 20 mg by mouth 2 (two) times daily before a meal.     . PREVIDENT 5000 BOOSTER PLUS 1.1 % PSTE Place 1 application onto teeth in the morning and at bedtime.    . simvastatin (ZOCOR) 20 MG tablet Take 20 mg by mouth at bedtime.     . torsemide (DEMADEX) 20 MG tablet Take 20-40 mg by mouth See admin instructions. Take 40mg  in the AM and 20mg  in the PM    . Vericiguat (VERQUVO) 2.5 MG TABS Take 2.5 mg by mouth daily. 30 tablet 3   No current facility-administered medications for this encounter.   BP 128/60   Pulse (!) 59   Wt 64.9 kg (143 lb)   SpO2 100%   BMI 19.94 kg/m   Wt Readings from Last 3 Encounters:  03/01/20 64.9 kg (143 lb)  01/22/20 63.5 kg (139 lb 14.4 oz)  12/17/19 66.7 kg (147 lb)   General: NAD Neck: No JVD, no thyromegaly or thyroid nodule.  Lungs: Clear to auscultation bilaterally with normal respiratory effort. CV: Nondisplaced PMI.  Heart regular S1/S2, no S3/S4, no murmur.  No peripheral edema.  No carotid bruit.  Normal pedal pulses.  Abdomen: Soft, nontender, no hepatosplenomegaly, no distention.  Skin: Intact without lesions or rashes.  Neurologic: Alert and oriented x 3.  Psych: Normal affect. Extremities: No clubbing or cyanosis.  HEENT: Normal.    Assessment/Plan: 1. Chronic systolic CHF: Ischemic cardiomyopathy.  Echo in 8/20 with EF 20-25%, moderately decreased RV systolic function.  St Jude  ICD.  CHF has been complicated by CKD stage 4.  RHC in 2/21 showed low but not markedly low cardiac output, he was admitted for diuresis with milrinone.  Milrinone was weaned off.  He is not significantly volume overloaded by exam or Corvue. NYHA class II symptoms.  - Continue torsemide 40 qam/20 qpm. BMET today.  - He will stay off Entresto and spironolactone with elevated creatinine.  - He is off beta blocker with bradycardia.  - GFR is too low for SGLT2 inhibitory.  - Narrow QRS, not candidate for upgrade to CRT.  - PYP scan in 5/21 not suggestive of transthyretin amyloidosis.   - I will have him start Verquvo 2.5 mg daily.  Will followup in pharmacy clinic for titration.  - Repeat echo at followup.  2. CAD: S/p CABG 2007, patent grafts on LHC in 2016. No exertional chest pain.  - Given stable CAD and Eliquis use, he is not on ASA.  - Continue statin.  3. Atrial fibrillation: Paroxysmal.  He remains in NSR today.   - Continue amiodarone 200 mg daily.  Check LFTs and TSH today.  He will need a regular eye exam while on amiodarone.  4. Right chronic pleural effusion: Only small effusion on recent CXR.  5. CKD stage 4:  Sees nephrology.  BMET today.  6. Carotid stenosis: Repeat dopplers in 10/22.     Followup with HF pharmacist for med titration in 3 wks.  See me in 2 months with echo.    [name]  03/01/2020

## 2020-03-02 NOTE — Progress Notes (Signed)
EPIC Encounter for ICM Monitoring  Patient Name: [name]. is a 83 y.o. male Date: 03/02/2020 Primary Care Physican: Shirline Frees, MD Primary Cardiologist:McLean Electrophysiologist:Camnitz 01/29/2020 Weight:142lbs   Office visit with Dr Aundra Dubin.  CorVue thoracic impedancenormal.  Prescribed:  Torsemide20 mg take 2 tablets (40 mg total) every AM and 20 mg every PM.   Labs: 03/01/2020 Creatinine 2.69, BUN 48,   Potassium 3.5, Sodium 137 11/26/2019 Creatinine 2.98, BUN 70,   Potassium 3.7, Sodium 135, GFR 19-22 10/27/2019 Creatinine2.84, BUN70, Potassium 3.5, Sodium137, GFR20-23 10/05/2019 Creatinine3.09, BUN62, Potassium 4.2, Sodium130, KCL27-51  09/03/2019 Creatinine3.32, BUN63, Potassium 4.0, [id number], HQP59-16  08/16/2019 Creatinine3.48, BUN71, Potassium 4.0, Sodium140, BWG66-59  08/15/2019 Creatinine4.57, BUN94, Potassium 4.1, Sodium138, DJT70-17  08/14/2019 Creatinine5.11, BUN105, Potassium4.1, Sodium133, GFR10-11  08/13/2019 Creatinine5.10, BUN111, Potassium3.9, Sodium136, GFR10-11 A complete set of results can be found in Results Review.  Recommendations:Any recommendations given at 03/01/2020 OV with Dr Aundra Dubin.  Follow-up plan: ICM clinic phone appointment on1/01/2021. 91 day device clinic remote transmission12/22/2021.   EP/Cardiology Office Visits: 05/10/2020 withDr Aundra Dubin. Recall for 07/07/2020 with Dr Angelena Form.Lastappointment with Dr Camnitz9/23/21 (no recall for 2022)  Copy of ICM check sent to [facility]  3 month ICM trend: 03/01/2020    1 Year ICM trend:       [name] Billings, RN 03/02/2020 1:25 PM

## 2020-03-07 ENCOUNTER — Telehealth (HOSPITAL_COMMUNITY): Payer: Self-pay | Admitting: Pharmacy Technician

## 2020-03-07 NOTE — Telephone Encounter (Signed)
Patient Advocate Encounter   Received notification from [facility] that prior authorization for [name] is required.   PA submitted on CoverMyMeds Key  [id number] Status is pending   Will continue to follow.

## 2020-03-08 ENCOUNTER — Telehealth (HOSPITAL_COMMUNITY): Payer: Self-pay | Admitting: *Deleted

## 2020-03-08 NOTE — Telephone Encounter (Signed)
Advanced Heart Failure Patient Advocate Encounter  Prior Authorization for Truett Mainland has been approved.    Effective dates: 03/07/20 through 03/07/21  Patients co-pay is $142.46 (patient is in the coverage gap, co-pay should be different in January). Placed patient on PAN heart failure fund wait list.   Spoke with patient. He is going to call me in January and we will check the co-pay at that time and mail it to his home from [place].   If it is unaffordable, will start patient assistance paperwork.  Charlann Boxer, CPhT

## 2020-03-08 NOTE — Telephone Encounter (Signed)
Pt left VM stating he is having "unusual weakness" and would like a call back to further discuss his symptoms. I called pt back no answer/left vm requesting return call.

## 2020-03-09 ENCOUNTER — Telehealth (HOSPITAL_COMMUNITY): Payer: Self-pay

## 2020-03-09 MED ORDER — TORSEMIDE 20 MG PO TABS
40.0000 mg | ORAL_TABLET | Freq: Every day | ORAL | 3 refills | Status: DC
Start: 2020-03-09 — End: 2020-04-08

## 2020-03-09 NOTE — Telephone Encounter (Signed)
Decrease torsemide to 40 mg once daily, watch for putting on volume though.  Would make sure using Ensure or Boost bid.

## 2020-03-09 NOTE — Addendum Note (Signed)
Addended by: Shela Nevin R on: 00/37/0488 11:35 AM   Modules accepted: Orders

## 2020-03-09 NOTE — Telephone Encounter (Signed)
[name] from [employer] along with Dr. Kenton Kingfisher at [facility] are concerned about patients weight loss. They know he is seeing a registered dietician but isn't sure if the weight loss is from his diet or his CHF. They know his goal weight at one point was 140lbs. . When patient was seen in our office on 03/01/20 his weight was 143lbs.Weights from last week are listed below   03/03/20:136 03/04/20:133 03/05/20:135 03/06/20:132 03/07/20:131 03/08/20:130(A.M.) 03/08/20:129(P.M.) 03/09/20:129      CB#[id number]

## 2020-03-09 NOTE — Telephone Encounter (Signed)
[name] advised and verbalized understanding,new Rx sent into pharmacy

## 2020-03-10 DIAGNOSIS — E1122 Type 2 diabetes mellitus with diabetic chronic kidney disease: Secondary | ICD-10-CM | POA: Diagnosis not present

## 2020-03-10 DIAGNOSIS — I2510 Atherosclerotic heart disease of native coronary artery without angina pectoris: Secondary | ICD-10-CM | POA: Diagnosis not present

## 2020-03-10 DIAGNOSIS — I10 Essential (primary) hypertension: Secondary | ICD-10-CM | POA: Diagnosis not present

## 2020-03-10 DIAGNOSIS — E782 Mixed hyperlipidemia: Secondary | ICD-10-CM | POA: Diagnosis not present

## 2020-03-10 DIAGNOSIS — D649 Anemia, unspecified: Secondary | ICD-10-CM | POA: Diagnosis not present

## 2020-03-10 DIAGNOSIS — N183 Chronic kidney disease, stage 3 unspecified: Secondary | ICD-10-CM | POA: Diagnosis not present

## 2020-03-10 DIAGNOSIS — I480 Paroxysmal atrial fibrillation: Secondary | ICD-10-CM | POA: Diagnosis not present

## 2020-03-10 DIAGNOSIS — K219 Gastro-esophageal reflux disease without esophagitis: Secondary | ICD-10-CM | POA: Diagnosis not present

## 2020-03-10 DIAGNOSIS — I129 Hypertensive chronic kidney disease with stage 1 through stage 4 chronic kidney disease, or unspecified chronic kidney disease: Secondary | ICD-10-CM | POA: Diagnosis not present

## 2020-03-11 NOTE — Progress Notes (Signed)
PCP: Shirline Frees, MD Cardiology: Dr. Angelena Form HF Cardiology: Dr. Aundra Dubin  HPI:  84 y.o. with h/o CAD s/p CABG, ischemic cardiomyopathy, and diabetes was referred by Dr. Angelena Form for evaluation of CHF.  Patient had CABG in 2007.  Last cath in 2016 showed all 4 grafts patent.  Echo was 25-30% in 2016.  Most recent echo in 8/20 showed EF 20-25% with moderate RV dysfunction.  He has had a recurrent right pleural effusion with thoracenteses in 5/19 and 8/20.  He thinks that he has been in atrial fibrillation persistently for about a year.  He used to take amiodarone but stopped it because he "felt bad."  However, he does not think that stopping amiodarone made him feel any better.  Creatinine has been elevated, CKD stage 3.   At a prior appt, he was thought to be volume overloaded and his furosemide was increased, but his creatinine steadily rose (2.55 => 3.13 => 3.72). Delene Loll was stopped and he has never started spironolactone.  Furosemide was cut back to 60 mg daily. He stated that during this time, he got his COVID vaccination and after that, he became severely nauseated for a number of days and ate/drank very little.  This eventually resolved.   On 05/11/19, he was cardioverted back to NSR.    Later in 2/21, he had RHC.  This showed elevated right and left heart filling pressures with low but not markedly low cardiac output.  He was admitted for IV diuresis.  While diuresing, he was maintained on milrinone.  This was weaned prior to discharge. PYP scan done in the hospital was equivocal for ATTR amyloidosis. He was discharged on torsemide.   He was seen by Darrick Grinder in 5/21 with significant weight loss.  He was thought to be volume depleted/dehydrated and creatinine was > 5.  He got IV fluid in the clinic but was still lightheaded and weak so was admitted.  He got IV fluid in the hospital and torsemide was stopped.  Weight started to rise, so he was started back on lower dose of torsemide, 40  qam/20 qpm.    He recently returned to HF Clinic for followup of CHF on 03/01/20.  Weight was down 16 lbs.  He was off beta blocker with bradycardia, HR around 60 today.  Short of breath with moderate exertion but generally no dyspnea walking on flat ground or up 1 flight stairs.  No orthopnea/PND.  No chest pain.  Had started Aricept due to memory issues.  Stamina had been better off beta blocker.  No palpitations, lightheadedness, or syncope.    Today he returns to HF clinic for pharmacist medication titration. At last visit with MD Truett Mainland (vericiguat) 2.5 mg daily was initiated. Additionally, his torsemide was decreased to 40 mg daily on 03/09/20 by Dr. Aundra Dubin due to sustained weight loss and he was instructed to use Boost/Ensure twice daily. Overall he is feeling well today. Has had a few spells of "feeling weak" lately. These have been correlated to low BGs. He will follow up with PCP who manages his diabetes. No dizziness, lightheadedness, chest pain or palpitations. Breathing is good.  Short of breath with moderate exertion but generally no dyspnea walking on flat ground. Weight has been stable at home, but down 7 lbs since last clinic visit. Takes torsemide 40 mg daily and has not needed any extra. No LEE, PND or orthopnea. Appetite is poor, but has been using Glucerna shakes twice daily. Taking all medications as prescribed and  tolerating all medications.     HF Medications: Vericiguat 2.5 mg daily Torsemide 40 mg daily  Has the patient been experiencing any side effects to the medications prescribed?  Yes. A few episodes of hypoglycemia, likely related to reduced appetite. Patient will follow up with PCP.   Does the patient have any problems obtaining medications due to transportation or finances?   no  Understanding of regimen: good Understanding of indications: good Potential of compliance: good Patient understands to avoid NSAIDs. Patient understands to avoid decongestants.     Pertinent Lab Values (03/01/20): [name] Serum creatinine 2.69, BUN 48, Potassium 3.5, Sodium 137  Vital Signs: . Weight: 136.4 lbs (last clinic weight: 143) . Blood pressure: 110/54  . Heart rate: 66   Assessment: 1. Chronic systolic CHF: Ischemic cardiomyopathy.  Echo in 8/20 with EF 20-25%, moderately decreased RV systolic function.  St Jude ICD.  CHF has been complicated by CKD stage 4.  RHC in 2/21 showed low but not markedly low cardiac output, he was admitted for diuresis with milrinone.  Milrinone was weaned off.   - He is not significantly volume overloaded by exam or Corvue. NYHA class II symptoms.  - Continue torsemide 40 qam daily - Increase Verquvo (vericiguat) to 5 mg daily.  - He is off beta blocker with bradycardia.  - He will stay off Entresto and spironolactone with elevated creatinine.  - He is off beta blocker with bradycardia.  - GFR is too low for SGLT2 inhibitor.  - Narrow QRS, not candidate for upgrade to CRT.  - PYP scan in 5/21 not suggestive of transthyretin amyloidosis.   - Repeat echo at followup.  2. CAD: S/p CABG 2007, patent grafts on LHC in 2016. No exertional chest pain.  - Given stable CAD and Eliquis use, he is not on ASA.  - Continue statin.  3. Atrial fibrillation: Paroxysmal.  He remains in NSR today.   - Continue amiodarone 200 mg daily.  Check LFTs and TSH today.  He will need a regular eye exam while on amiodarone.  4. Right chronic pleural effusion: Only small effusion on recent CXR.  5. CKD stage 4:  Sees nephrology.   6. Carotid stenosis: Repeat dopplers in 10/22.    Plan: 1) Medication changes: Based on clinical presentation, vital signs and recent labs will increase Verquvo to 5 mg daily.  3) Follow-up: 1 month with Dr. Rush Farmer, PharmD, BCPS, [facility], CPP Heart Failure Clinic Pharmacist [phone]

## 2020-03-16 ENCOUNTER — Ambulatory Visit (INDEPENDENT_AMBULATORY_CARE_PROVIDER_SITE_OTHER): Payer: PPO

## 2020-03-16 DIAGNOSIS — I5022 Chronic systolic (congestive) heart failure: Secondary | ICD-10-CM | POA: Diagnosis not present

## 2020-03-20 LAB — CUP PACEART REMOTE DEVICE CHECK
Battery Remaining Longevity: 80 mo
Battery Remaining Percentage: 77 %
Battery Voltage: 2.98 V
Brady Statistic RV Percent Paced: 1 %
Date Time Interrogation Session: 20211222020017
HighPow Impedance: 69 Ohm
HighPow Impedance: 69 Ohm
Implantable Lead Implant Date: 20190319
Implantable Lead Location: 753860
Implantable Pulse Generator Implant Date: 20190319
Lead Channel Impedance Value: 380 Ohm
Lead Channel Pacing Threshold Amplitude: 0.75 V
Lead Channel Pacing Threshold Pulse Width: 0.5 ms
Lead Channel Sensing Intrinsic Amplitude: 12 mV
Lead Channel Setting Pacing Amplitude: 2.5 V
Lead Channel Setting Pacing Pulse Width: 0.5 ms
Lead Channel Setting Sensing Sensitivity: 0.5 mV
Pulse Gen Serial Number: 9811279

## 2020-03-23 ENCOUNTER — Ambulatory Visit (HOSPITAL_COMMUNITY)
Admission: RE | Admit: 2020-03-23 | Discharge: 2020-03-23 | Disposition: A | Discharge status: Home / Self Care | Payer: PPO | Source: Ambulatory Visit | Attending: Cardiology | Admitting: Cardiology

## 2020-03-23 ENCOUNTER — Other Ambulatory Visit: Payer: Self-pay

## 2020-03-23 VITALS — BP 110/54 | HR 66 | Wt 136.4 lb

## 2020-03-23 DIAGNOSIS — R0602 Shortness of breath: Secondary | ICD-10-CM | POA: Diagnosis not present

## 2020-03-23 DIAGNOSIS — N184 Chronic kidney disease, stage 4 (severe): Secondary | ICD-10-CM | POA: Diagnosis not present

## 2020-03-23 DIAGNOSIS — Z7901 Long term (current) use of anticoagulants: Secondary | ICD-10-CM | POA: Insufficient documentation

## 2020-03-23 DIAGNOSIS — J90 Pleural effusion, not elsewhere classified: Secondary | ICD-10-CM | POA: Insufficient documentation

## 2020-03-23 DIAGNOSIS — Z951 Presence of aortocoronary bypass graft: Secondary | ICD-10-CM | POA: Diagnosis not present

## 2020-03-23 DIAGNOSIS — I5022 Chronic systolic (congestive) heart failure: Secondary | ICD-10-CM | POA: Diagnosis not present

## 2020-03-23 DIAGNOSIS — I2510 Atherosclerotic heart disease of native coronary artery without angina pectoris: Secondary | ICD-10-CM | POA: Insufficient documentation

## 2020-03-23 DIAGNOSIS — E1122 Type 2 diabetes mellitus with diabetic chronic kidney disease: Secondary | ICD-10-CM | POA: Insufficient documentation

## 2020-03-23 DIAGNOSIS — I255 Ischemic cardiomyopathy: Secondary | ICD-10-CM | POA: Insufficient documentation

## 2020-03-23 DIAGNOSIS — I480 Paroxysmal atrial fibrillation: Secondary | ICD-10-CM | POA: Diagnosis not present

## 2020-03-23 DIAGNOSIS — Z79899 Other long term (current) drug therapy: Secondary | ICD-10-CM | POA: Insufficient documentation

## 2020-03-23 MED ORDER — VERICIGUAT 5 MG PO TABS
5.0000 mg | ORAL_TABLET | Freq: Every day | ORAL | 11 refills | Status: DC
Start: 2020-03-23 — End: 2020-04-14

## 2020-03-23 NOTE — Patient Instructions (Signed)
It was a pleasure seeing you today!  MEDICATIONS: -We are changing your medications today -Increase Vericiguat to 5 mg (1 tablet) daily. You may take 2 tablets of the 2.5 mg strength daily until you pick up the new dose.  -Call if you have questions about your medications.   NEXT APPOINTMENT: Return to clinic in 1 month with Dr. Aundra Dubin.  In general, to take care of your heart failure: -Limit your fluid intake to 2 Liters (half-gallon) per day.   -Limit your salt intake to ideally 2-3 grams (2000-3000 mg) per day. -Weigh yourself daily and record, and bring that "weight diary" to your next appointment.  (Weight gain of 2-3 pounds in 1 day typically means fluid weight.) -The medications for your heart are to help your heart and help you live longer.   -Please contact us before stopping any of your heart medications.  Call the clinic at [phone] with questions or to reschedule future appointments.

## 2020-03-25 DIAGNOSIS — I429 Cardiomyopathy, unspecified: Secondary | ICD-10-CM | POA: Diagnosis not present

## 2020-03-30 ENCOUNTER — Telehealth (HOSPITAL_COMMUNITY): Payer: Self-pay | Admitting: Pharmacy Technician

## 2020-03-30 NOTE — Progress Notes (Signed)
Remote ICD transmission.   

## 2020-03-30 NOTE — Telephone Encounter (Signed)
Called and left the patient a message regarding his Verquvo co-pay.  Current 30 day co-pay is $100, 90 days is $200.  Advised him to call back so we can discuss any financial barriers that may exist from this co-pay.  Charlann Boxer, CPhT

## 2020-04-05 ENCOUNTER — Ambulatory Visit (INDEPENDENT_AMBULATORY_CARE_PROVIDER_SITE_OTHER): Payer: PPO

## 2020-04-05 DIAGNOSIS — Z95810 Presence of automatic (implantable) cardiac defibrillator: Secondary | ICD-10-CM

## 2020-04-05 DIAGNOSIS — I5022 Chronic systolic (congestive) heart failure: Secondary | ICD-10-CM

## 2020-04-06 NOTE — Progress Notes (Signed)
Looks like he should be ok to take torsemide 20 mg daily.

## 2020-04-06 NOTE — Progress Notes (Signed)
EPIC Encounter for ICM Monitoring  Patient Name: [name]. is a 85 y.o. male Date: 04/06/2020 Primary Care Physican: Shirline Frees, MD Primary Cardiologist:[place] Electrophysiologist:Camnitz 04/06/2020 Weight:129lbs   Spoke with patient and reports feeling well at this time.  Denies fluid symptoms.   He reports that he received phone call to say he should decrease Torsemide to 20 mg daily but unable to locate a note in Epic that provides that instructions. Torsemide 40 mg daily is listed in Epic medications as well as in Audry Riles, pharmacist note.    CorVue thoracic impedancenormal.  Prescribed:  Torsemide20 mg take 2 tablets (40 mg total). 04/06/2020 Pt/Wife state patient taking 20 mg Torsemide daily as instructed by HF clinic but med list or notes do not reflect the decrease.   Labs: 03/01/2020 Creatinine 2.69, BUN 48,   Potassium 3.5, Sodium 137 11/26/2019 Creatinine 2.98, BUN 70,   Potassium 3.7, Sodium 135, GFR 19-22 10/27/2019 Creatinine2.84, BUN70, Potassium 3.5, Sodium137, GFR20-23 10/05/2019 Creatinine3.09, BUN62, Potassium 4.2, Sodium130, [id number]  09/03/2019 Creatinine3.32, BUN63, Potassium 4.0, [id number], TJQ30-09  08/16/2019 Creatinine3.48, BUN71, Potassium 4.0, Sodium140, QZR00-76  08/15/2019 Creatinine4.57, BUN94, Potassium 4.1, Sodium138, AUQ33-35  08/14/2019 Creatinine5.11, BUN105, Potassium4.1, Sodium133, GFR10-11  08/13/2019 Creatinine5.10, BUN111, Potassium3.9, Sodium136, GFR10-11 A complete set of results can be found in Results Review.  Recommendations:  No changes and encouraged to call if experiencing any fluid symptoms.  Follow-up plan: ICM clinic phone appointment on2/21/2022. 91 day device clinic remote transmission3/22/2022.   EP/Cardiology Office Visits: 05/10/2020 withDr Aundra Dubin. Recall for 07/07/2020 with Dr  Angelena Form.Lastappointment with Dr Camnitz9/23/21 (no recall for 2022)  Copy of ICM check sent to Dr.Camnitzand Dr Aundra Dubin as Juluis Rainier patient is taking Torsemide 20 mg instead of 40 mg.   3 month ICM trend: 04/05/2020.    1 Year ICM trend:       Rosalene Billings, RN 04/06/2020 4:37 PM

## 2020-04-08 MED ORDER — TORSEMIDE 20 MG PO TABS: 20.0000 mg | ORAL_TABLET | Freq: Every day | ORAL | 3 refills | Status: DC

## 2020-04-08 NOTE — Progress Notes (Addendum)
Med list updated to reflect Torsemide dosage recommendation.

## 2020-04-08 NOTE — Addendum Note (Signed)
Addended by: Rosalene Billings on: 04/08/2020 08:19 AM   Modules accepted: Orders

## 2020-04-12 ENCOUNTER — Other Ambulatory Visit (HOSPITAL_COMMUNITY): Payer: Self-pay | Admitting: Cardiology

## 2020-04-14 ENCOUNTER — Other Ambulatory Visit (HOSPITAL_COMMUNITY): Payer: Self-pay | Admitting: *Deleted

## 2020-04-14 DIAGNOSIS — N184 Chronic kidney disease, stage 4 (severe): Secondary | ICD-10-CM | POA: Diagnosis not present

## 2020-04-14 MED ORDER — VERICIGUAT 5 MG PO TABS
5.0000 mg | ORAL_TABLET | Freq: Every day | ORAL | 11 refills | Status: DC
Start: 2020-04-14 — End: 2020-04-20

## 2020-04-18 DIAGNOSIS — D631 Anemia in chronic kidney disease: Secondary | ICD-10-CM | POA: Diagnosis not present

## 2020-04-18 DIAGNOSIS — E1122 Type 2 diabetes mellitus with diabetic chronic kidney disease: Secondary | ICD-10-CM | POA: Diagnosis not present

## 2020-04-18 DIAGNOSIS — I5022 Chronic systolic (congestive) heart failure: Secondary | ICD-10-CM | POA: Diagnosis not present

## 2020-04-18 DIAGNOSIS — R809 Proteinuria, unspecified: Secondary | ICD-10-CM | POA: Diagnosis not present

## 2020-04-18 DIAGNOSIS — I10 Essential (primary) hypertension: Secondary | ICD-10-CM | POA: Diagnosis not present

## 2020-04-18 DIAGNOSIS — I2510 Atherosclerotic heart disease of native coronary artery without angina pectoris: Secondary | ICD-10-CM | POA: Diagnosis not present

## 2020-04-18 DIAGNOSIS — D649 Anemia, unspecified: Secondary | ICD-10-CM | POA: Diagnosis not present

## 2020-04-18 DIAGNOSIS — N183 Chronic kidney disease, stage 3 unspecified: Secondary | ICD-10-CM | POA: Diagnosis not present

## 2020-04-18 DIAGNOSIS — K219 Gastro-esophageal reflux disease without esophagitis: Secondary | ICD-10-CM | POA: Diagnosis not present

## 2020-04-18 DIAGNOSIS — E782 Mixed hyperlipidemia: Secondary | ICD-10-CM | POA: Diagnosis not present

## 2020-04-18 DIAGNOSIS — I129 Hypertensive chronic kidney disease with stage 1 through stage 4 chronic kidney disease, or unspecified chronic kidney disease: Secondary | ICD-10-CM | POA: Diagnosis not present

## 2020-04-18 DIAGNOSIS — N184 Chronic kidney disease, stage 4 (severe): Secondary | ICD-10-CM | POA: Diagnosis not present

## 2020-04-18 DIAGNOSIS — I480 Paroxysmal atrial fibrillation: Secondary | ICD-10-CM | POA: Diagnosis not present

## 2020-04-20 ENCOUNTER — Other Ambulatory Visit (HOSPITAL_COMMUNITY): Payer: Self-pay

## 2020-04-20 MED ORDER — VERICIGUAT 5 MG PO TABS
5.0000 mg | ORAL_TABLET | Freq: Every day | ORAL | 11 refills | Status: DC
Start: 2020-04-20 — End: 2020-05-10

## 2020-04-23 ENCOUNTER — Other Ambulatory Visit: Payer: Self-pay | Admitting: Internal Medicine

## 2020-04-24 DIAGNOSIS — I429 Cardiomyopathy, unspecified: Secondary | ICD-10-CM | POA: Diagnosis not present

## 2020-04-25 DIAGNOSIS — I429 Cardiomyopathy, unspecified: Secondary | ICD-10-CM | POA: Diagnosis not present

## 2020-05-10 ENCOUNTER — Ambulatory Visit (HOSPITAL_COMMUNITY)
Admission: RE | Admit: 2020-05-10 | Discharge: 2020-05-10 | Disposition: A | Discharge status: Home / Self Care | Payer: PPO | Source: Ambulatory Visit | Attending: Interventional Cardiology | Admitting: Interventional Cardiology

## 2020-05-10 ENCOUNTER — Encounter (HOSPITAL_COMMUNITY): Payer: Self-pay | Admitting: Cardiology

## 2020-05-10 ENCOUNTER — Other Ambulatory Visit: Payer: Self-pay

## 2020-05-10 ENCOUNTER — Ambulatory Visit (HOSPITAL_BASED_OUTPATIENT_CLINIC_OR_DEPARTMENT_OTHER)
Admission: RE | Admit: 2020-05-10 | Discharge: 2020-05-10 | Disposition: A | Discharge status: Home / Self Care | Payer: PPO | Source: Ambulatory Visit | Attending: Cardiology | Admitting: Cardiology

## 2020-05-10 VITALS — BP 120/60 | HR 64 | Wt 152.4 lb

## 2020-05-10 DIAGNOSIS — R0600 Dyspnea, unspecified: Secondary | ICD-10-CM | POA: Diagnosis not present

## 2020-05-10 DIAGNOSIS — Z79899 Other long term (current) drug therapy: Secondary | ICD-10-CM | POA: Insufficient documentation

## 2020-05-10 DIAGNOSIS — I130 Hypertensive heart and chronic kidney disease with heart failure and stage 1 through stage 4 chronic kidney disease, or unspecified chronic kidney disease: Secondary | ICD-10-CM | POA: Diagnosis not present

## 2020-05-10 DIAGNOSIS — I252 Old myocardial infarction: Secondary | ICD-10-CM | POA: Diagnosis not present

## 2020-05-10 DIAGNOSIS — I25700 Atherosclerosis of coronary artery bypass graft(s), unspecified, with unstable angina pectoris: Secondary | ICD-10-CM

## 2020-05-10 DIAGNOSIS — F0390 Unspecified dementia without behavioral disturbance: Secondary | ICD-10-CM | POA: Diagnosis not present

## 2020-05-10 DIAGNOSIS — I255 Ischemic cardiomyopathy: Secondary | ICD-10-CM | POA: Insufficient documentation

## 2020-05-10 DIAGNOSIS — J90 Pleural effusion, not elsewhere classified: Secondary | ICD-10-CM | POA: Insufficient documentation

## 2020-05-10 DIAGNOSIS — I340 Nonrheumatic mitral (valve) insufficiency: Secondary | ICD-10-CM | POA: Insufficient documentation

## 2020-05-10 DIAGNOSIS — Z8249 Family history of ischemic heart disease and other diseases of the circulatory system: Secondary | ICD-10-CM | POA: Diagnosis not present

## 2020-05-10 DIAGNOSIS — I2510 Atherosclerotic heart disease of native coronary artery without angina pectoris: Secondary | ICD-10-CM | POA: Insufficient documentation

## 2020-05-10 DIAGNOSIS — E1122 Type 2 diabetes mellitus with diabetic chronic kidney disease: Secondary | ICD-10-CM | POA: Diagnosis not present

## 2020-05-10 DIAGNOSIS — Z7901 Long term (current) use of anticoagulants: Secondary | ICD-10-CM | POA: Diagnosis not present

## 2020-05-10 DIAGNOSIS — Z794 Long term (current) use of insulin: Secondary | ICD-10-CM | POA: Diagnosis not present

## 2020-05-10 DIAGNOSIS — N184 Chronic kidney disease, stage 4 (severe): Secondary | ICD-10-CM | POA: Insufficient documentation

## 2020-05-10 DIAGNOSIS — Z87891 Personal history of nicotine dependence: Secondary | ICD-10-CM | POA: Insufficient documentation

## 2020-05-10 DIAGNOSIS — I5022 Chronic systolic (congestive) heart failure: Secondary | ICD-10-CM | POA: Diagnosis not present

## 2020-05-10 DIAGNOSIS — Z951 Presence of aortocoronary bypass graft: Secondary | ICD-10-CM | POA: Diagnosis not present

## 2020-05-10 DIAGNOSIS — I4819 Other persistent atrial fibrillation: Secondary | ICD-10-CM | POA: Insufficient documentation

## 2020-05-10 DIAGNOSIS — I272 Pulmonary hypertension, unspecified: Secondary | ICD-10-CM | POA: Insufficient documentation

## 2020-05-10 LAB — CBC
HCT: 30.7 % — ABNORMAL LOW (ref 39.0–52.0)
Hemoglobin: 9.7 g/dL — ABNORMAL LOW (ref 13.0–17.0)
MCH: 29 pg (ref 26.0–34.0)
MCHC: 31.6 g/dL (ref 30.0–36.0)
MCV: 91.9 fL (ref 80.0–100.0)
Platelets: 192 10*3/uL (ref 150–400)
RBC: 3.34 MIL/uL — ABNORMAL LOW (ref 4.22–5.81)
RDW: 17.1 % — ABNORMAL HIGH (ref 11.5–15.5)
WBC: 6.3 10*3/uL (ref 4.0–10.5)
nRBC: 0 % (ref 0.0–0.2)

## 2020-05-10 LAB — COMPREHENSIVE METABOLIC PANEL
ALT: 13 U/L (ref 0–44)
AST: 19 U/L (ref 15–41)
Albumin: 3 g/dL — ABNORMAL LOW (ref 3.5–5.0)
Alkaline Phosphatase: 78 U/L (ref 38–126)
Anion gap: 10 (ref 5–15)
BUN: 42 mg/dL — ABNORMAL HIGH (ref 8–23)
CO2: 31 mmol/L (ref 22–32)
Calcium: 8.7 mg/dL — ABNORMAL LOW (ref 8.9–10.3)
Chloride: 97 mmol/L — ABNORMAL LOW (ref 98–111)
Creatinine, Ser: 1.95 mg/dL — ABNORMAL HIGH (ref 0.61–1.24)
GFR, Estimated: 34 mL/min — ABNORMAL LOW (ref >60)
Glucose, Bld: 167 mg/dL — ABNORMAL HIGH (ref 70–99)
Potassium: 4.3 mmol/L (ref 3.5–5.1)
Sodium: 138 mmol/L (ref 135–145)
Total Bilirubin: 0.5 mg/dL (ref 0.3–1.2)
Total Protein: 6.2 g/dL — ABNORMAL LOW (ref 6.5–8.1)

## 2020-05-10 LAB — LIPID PANEL
Cholesterol: 146 mg/dL (ref 0–200)
HDL: 64 mg/dL (ref >40)
LDL Cholesterol: 75 mg/dL (ref 0–99)
Total CHOL/HDL Ratio: 2.3 RATIO
Triglycerides: 37 mg/dL (ref <150)
VLDL: 7 mg/dL (ref 0–40)

## 2020-05-10 LAB — ECHOCARDIOGRAM COMPLETE
Area-P 1/2: 2.83 cm2
S' Lateral: 4.7 cm

## 2020-05-10 LAB — BRAIN NATRIURETIC PEPTIDE: B Natriuretic Peptide: 1821.5 pg/mL — ABNORMAL HIGH (ref 0.0–100.0)

## 2020-05-10 MED ORDER — TORSEMIDE 20 MG PO TABS: ORAL_TABLET | ORAL | 3 refills | Status: DC

## 2020-05-10 MED ORDER — EMPAGLIFLOZIN 10 MG PO TABS: 10.0000 mg | ORAL_TABLET | Freq: Every day | ORAL | 3 refills | Status: DC

## 2020-05-10 MED ORDER — VERICIGUAT 10 MG PO TABS: 10.0000 mg | ORAL_TABLET | Freq: Every day | ORAL | 11 refills | Status: DC

## 2020-05-10 NOTE — Progress Notes (Signed)
Height: 5'11"    Weight:152.4lbs BMI:21.3  Today's Date:05/10/2020  STOP BANG RISK ASSESSMENT S (snore) Have you been told that you snore?     NO   T (tired) Are you often tired, fatigued, or sleepy during the day?   YES  O (obstruction) Do you stop breathing, choke, or gasp during sleep? /NO   P (pressure) Do you have or are you being treated for high blood pressure? YES/   B (BMI) Is your body index greater than 35 kg/m? /NO   A (age) Are you 83 years old or older? YES/   N (neck) Do you have a neck circumference greater than 16 inches?   YES/NO   G (gender) Are you a male? YES/   TOTAL STOP/BANG "YES" ANSWERS 4                                                                       For Office Use Only              Procedure Order Form    YES to 3+ Stop Bang questions OR two clinical symptoms - patient qualifies for WatchPAT (CPT 95800)      Clinical Notes: Will consult Sleep Specialist and refer for management of therapy due to patient increased risk of Sleep Apnea. Ordering a sleep study due to the following two clinical symptoms:  History of high blood pressure R03.0 [name]

## 2020-05-10 NOTE — Patient Instructions (Addendum)
Labs done today. We will contact you only if your labs are abnormal.  START Jardiance '10mg'$  (1 tablet) by mouth daily  INCREASE Toresmide to '40mg'$  (2 tablets) by mouth daily for 3 days THEN take for '40mg'$  (2 tablets) daily alternating with '20mg'$  (1 tablet) daily.  INCREASE Vericiguat to '10mg'$  (1 tablet) by mouth daily.  Take over the counter Colace '100mg'$  daily for 2 times daily for constipation   No other medication changes were made. Please continue all current medications as prescribed.  Your provider has recommended that you have a home sleep study.  We have provided you with the equipment in our office today. Please download the app and follow the instructions. YOUR PIN NUMBER IS: [id number]. Once you have completed the test you just dispose of the equipment, the information is automatically uploaded to [place] via blue-tooth technology. If your test is positive for sleep apnea and you need a home CPAP machine you will be contacted by Dr Theodosia Blender office [facility]) to set this up.  Your physician recommends that you schedule a follow-up appointment in: 10 days for a lab only appointment and in 3 weeks with our APP Clinic here in our office.    If you have any questions or concerns before your next appointment please send [place] a message through [place] or call our office at [phone].    TO LEAVE A MESSAGE FOR THE NURSE SELECT OPTION 2, PLEASE LEAVE A MESSAGE INCLUDING: . YOUR NAME . DATE OF BIRTH . CALL BACK NUMBER . REASON FOR CALL**this is important as we prioritize the call backs  YOU WILL RECEIVE A CALL BACK THE SAME DAY AS LONG AS YOU CALL BEFORE 4:00 PM   Do the following things EVERYDAY: 1) Weigh yourself in the morning before breakfast. Write it down and keep it in a log. 2) Take your medicines as prescribed 3) Eat low salt foods--Limit salt (sodium) to 2000 mg per day.  4) Stay as active as you can everyday 5) Limit all fluids for the day to less than 2 liters   At the [facility], you and your health needs are our priority. As part of our continuing mission to provide you with exceptional heart care, we have created designated Provider Care Teams. These Care Teams include your primary Cardiologist (physician) and Advanced Practice Providers (APPs- Physician Assistants and Nurse Practitioners) who all work together to provide you with the care you need, when you need it.   You may see any of the following providers on your designated Care Team at your next follow up: [name] Dr Glori Bickers . Dr [name] . Darrick Grinder, NP . Lyda Jester, PA . Audry Riles, PharmD   Please be sure to bring in all your medications bottles to every appointment.

## 2020-05-10 NOTE — Progress Notes (Signed)
  Echocardiogram 2D Echocardiogram has been performed.  [name] 05/10/2020, 10:57 AM

## 2020-05-10 NOTE — Progress Notes (Signed)
PCP: Shirline Frees, MD Cardiology: Dr. Angelena Form HF Cardiology: Dr. Aundra Dubin  85 y.o. with h/o CAD s/p CABG, ischemic cardiomyopathy, and diabetes was referred by Dr. Angelena Form for evaluation of CHF.  Patient had CABG in 2007.  Last cath in 2016 showed all 4 grafts patent.  Echo was 25-30% in 2016.  Most recent echo in 8/20 showed EF 20-25% with moderate RV dysfunction.  He has had a recurrent right pleural effusion with thoracenteses in 5/19 and 8/20.  He thinks that he has been in atrial fibrillation persistently for about a year.  He used to take amiodarone but stopped it because he "felt bad."  However, he does not think that stopping amiodarone made him feel any better.  Creatinine has been elevated, CKD stage 3.   At a prior appt, I thought he was volume overloaded and increased Lasix, but his creatinine steadily rose (2.55 => 3.13 => 3.72). I stopped Entresto and he never started spironolactone.  Lasix was cut back to 60 mg daily. He says that during this time, he got his COVID vaccination and after that, he became severely nauseated for a number of days and ate/drank very little.  This eventually resolved.   On 05/11/19, I cardioverted him back to NSR.    Later in 2/21, he had RHC.  This showed elevated right and left heart filling pressures with low but not markedly low cardiac output.  He was admitted for IV diuresis.  While diuresing, he was maintained on milrinone.  This was weaned prior to discharge. PYP scan done in the hospital was equivocal for ATTR amyloidosis. He was discharged on torsemide.   He was seen by Darrick Grinder in 5/21 with significant weight loss.  He was thought to be volume depleted/dehydrated and creatinine was > 5.  He got IV fluid in the clinic but was still lightheaded and weak so was admitted.  He got IV fluid in the hospital and torsemide was stopped.  Weight started to rise, so he was started back on lower dose of torsemide, 40 qam/20 qpm.  Over time, he dropped dose of  torsemide down to 20 mg daily.   Echo was done today, EF 25-30% with global hypokinesis with apical akinesis, mildly decreased RV systolic function, PASP 56, moderate MR, IVC dilated.   He returns today for followup of CHF.  Weight is up 9 lbs.  He is not short of breath walking around his house. Mild dyspnea with longer walks.  He falls asleep a lot during the day per his wife though he does not snore loudly.  No orthopnea/PND.  No chest pain.  Now on Aricept for mild dementia.   St Jude device interrogation: low thoracic impedance suggestive of volume overload.   Labs (12/20): K 4.5, creatinine 2.01 => 1.84  Labs (2/21): K 3.9, creatinine 2.55 => 3.13 => 3.72 Labs (3/21): hgb 11.1, K 3.6 => 4.6, creatinine 3.04 => 3.64, myeloma panel negative, urine immunofixation negative. TSH and LFTs normal.  Labs (5/21): K 4, creatinine 5.1 => 3.48 Labs (9/21): K 3.7, creatinine 2.98, BNP 1752 Labs (12/21): K 3.5, creatinine 2.69 (GFR 23), TSH normal, hgb 11.3  ECG (personally reviewed): NSR, 1st degree AVB, low voltage, LAFB  PMH: 1. Type 2 diabetes  2. HTN 3. Hyperlipidemia 4. CAD: CABG x 4 in 2007 with LIMA-LAD, SVG-OM, sequential SVG-PLV/PDA.   - LHC (2016): Patent grafts.  5. Atrial fibrillation: Persistent.  - DCCV to NSR 2/21.  6. CKD stage 4.  7.  Carotid stenosis:  - Carotid dopplers (8/20): 60-79% RICA stenosis.  - Carotid dopplers (10/21): 60-79% RICA stenosis.  8. Chronic systolic CHF: Ischemic cardiomyopathy.    - Echo (2016): EF 25-30% - Echo (2018): EF 25% - Echo (8/20): EF 20-25%, moderately decreased RV systolic function, severe LAE.  - St Jude ICD.   - RHC (3/21): mean RA 18, PA 71/27, mean PCWP 24, CI 2.16 (thermo), CI 2.2 (Fick), PVR 4.6 WU, PAPi 2.4.  - PYP scan (3/21): grade 1 but H/CL ratio 1.6 => equivocal for TTR amyloidosis.   - PYP scan (5/21): grade 1, H/CL ratio 1.2 => not suggestive of TTR amyloidosis.  - Echo (2/22): EF 25-30% with global hypokinesis with  apical akinesis, mildly decreased RV systolic function, PASP 56, moderate MR, IVC dilated.  9. Pleural effusion on right: s/p thoracentesis in 5/19 and 8/20.  10. Dementia: Mild.   Social History   Socioeconomic History  . Marital status: Married    Spouse name: Not on file  . Number of children: 2  . Years of education: Not on file  . Highest education level: Not on file  Occupational History  . Occupation: Horticulturist, commercial: RETIRED  Tobacco Use  . Smoking status: Former Smoker    Types: Cigarettes    Quit date: 03/26/1970    Years since quitting: 50.1  . Smokeless tobacco: Never Used  Vaping Use  . Vaping Use: Never used  Substance and Sexual Activity  . Alcohol use: Yes    Alcohol/week: 1.0 standard drink    Types: 1 Glasses of wine per week  . Drug use: No  . Sexual activity: Not on file  Other Topics Concern  . Not on file  Social History Narrative  . Not on file   Social Determinants of Health   Financial Resource Strain: Not on file  Food Insecurity: Not on file  Transportation Needs: No Transportation Needs  . Lack of Transportation (Medical): No  . Lack of Transportation (Non-Medical): No  Physical Activity: Inactive  . Days of Exercise per Week: 0 days  . Minutes of Exercise per Session: 0 min  Stress: Not on file  Social Connections: Not on file  Intimate Partner Violence: Not on file   Family History  Problem Relation Age of Onset  . Heart disease Father 12       Heart disease before age 63  . Heart attack Father   . Hypertension Father   . Hyperlipidemia Father   . Varicose Veins Father   . Stroke Father        ? not sure  . Colon cancer Maternal Aunt   . Colon cancer Maternal Uncle   . Diverticulitis Other   . Heart attack Other    ROS: All systems reviewed and negative except as per HPI.   Current Outpatient Medications  Medication Sig Dispense Refill  . amiodarone (PACERONE) 200 MG tablet Take 0.5 tablets (100 mg total) by  mouth daily. 90 tablet 3  . apixaban (ELIQUIS) 2.5 MG TABS tablet Take 1 tablet (2.5 mg total) by mouth 2 (two) times daily. 180 tablet 1  . donepezil (ARICEPT) 5 MG tablet Take 10 mg by mouth at bedtime.    . empagliflozin (JARDIANCE) 10 MG TABS tablet Take 1 tablet (10 mg total) by mouth daily before breakfast. 90 tablet 3  . insulin degludec (TRESIBA FLEXTOUCH) 200 UNIT/ML FlexTouch Pen Inject 6 Units into the skin daily.    [name] omeprazole (PRILOSEC)  20 MG capsule Take 20 mg by mouth 2 (two) times daily before a meal.     . simvastatin (ZOCOR) 20 MG tablet Take 20 mg by mouth at bedtime.     . Vericiguat 10 MG TABS Take 10 mg by mouth daily. 30 tablet 11  . torsemide (DEMADEX) 20 MG tablet Take '40mg'$  (2 tablets) by mouth daily, alternating with '20mg'$  (1 tablet) by mouth daily. 90 tablet 3   No current facility-administered medications for this encounter.   BP 120/60   Pulse 64   Wt 69.1 kg (152 lb 6.4 oz)   SpO2 99%   BMI 21.26 kg/m   Wt Readings from Last 3 Encounters:  05/10/20 69.1 kg (152 lb 6.4 oz)  03/23/20 61.9 kg (136 lb 6.4 oz)  03/01/20 64.9 kg (143 lb)   General: NAD Neck: JVP 8-9 cm with HJR, no thyromegaly or thyroid nodule.  Lungs: Clear to auscultation bilaterally with normal respiratory effort. CV: Lateral PMI.  Heart regular S1/S2, no S3/S4, 2/6 HSM apex.  1+ edema to knees.  No carotid bruit.  Normal pedal pulses.  Abdomen: Soft, nontender, no hepatosplenomegaly, no distention.  Skin: Intact without lesions or rashes.  Neurologic: Alert and oriented x 3.  Psych: Normal affect. Extremities: No clubbing or cyanosis.  HEENT: Normal.    Assessment/Plan: 1. Chronic systolic CHF: Ischemic cardiomyopathy.  Echo in 8/20 with EF 20-25%, moderately decreased RV systolic function.  St Jude ICD.  CHF has been complicated by CKD stage 4.  RHC in 2/21 showed low but not markedly low cardiac output, he was admitted for diuresis with milrinone.  Milrinone was weaned off. Echo  today showed EF 25-30% with global hypokinesis with apical akinesis, mildly decreased RV systolic function, PASP 56, moderate MR, IVC dilated.  He has cut back his torsemide to 20 mg daily and is now volume overloaded by exam and Corvue, weight up 9 lbs.  NYHA class II-III with unchanged symptoms.  - I will have him start Jardiance today (GFR 20-25 range).   - He will increase torsemide to 40 mg daily x 3 days then 40 daily alternating with 20 daily after that. BMET today and in 10 days.  - Increase Verquvo to 10 mg daily.  - He will stay off Entresto and spironolactone with elevated creatinine.  - He is off beta blocker with bradycardia.   - Narrow QRS, not candidate for upgrade to CRT.  - PYP scan in 5/21 not suggestive of transthyretin amyloidosis.   2. CAD: S/p CABG 2007, patent grafts on LHC in 2016. No exertional chest pain.  - Given stable CAD and Eliquis use, he is not on ASA.  - Continue statin.  3. Atrial fibrillation: Paroxysmal.  He remains in NSR today.   - Continue amiodarone 200 mg daily.  Check LFTs and TSH today.  He will need a regular eye exam while on amiodarone.  4. Right chronic pleural effusion: Only small effusion on last CXR.  5. CKD stage 4:  Sees nephrology.  BMET today.  6. Carotid stenosis: Repeat dopplers in 10/22.    7. Suspect OSA: Daytime sleepiness.  - I will arrange for home sleep study.   Followup in 3 wks with NP/PA to reassess volume status.     [name]  05/10/2020

## 2020-05-12 ENCOUNTER — Telehealth (HOSPITAL_COMMUNITY): Payer: Self-pay | Admitting: *Deleted

## 2020-05-12 NOTE — Telephone Encounter (Signed)
[place] sleep study no pre cert reqd.   Routed to [facility]

## 2020-05-16 ENCOUNTER — Ambulatory Visit (INDEPENDENT_AMBULATORY_CARE_PROVIDER_SITE_OTHER): Payer: PPO

## 2020-05-16 DIAGNOSIS — I5022 Chronic systolic (congestive) heart failure: Secondary | ICD-10-CM

## 2020-05-16 DIAGNOSIS — Z95810 Presence of automatic (implantable) cardiac defibrillator: Secondary | ICD-10-CM

## 2020-05-19 ENCOUNTER — Telehealth (HOSPITAL_COMMUNITY): Payer: Self-pay | Admitting: Surgery

## 2020-05-19 NOTE — Telephone Encounter (Signed)
Patient called and his wife informed that insurance prior authorization was not required for home sleep study.  I let her know to complete test in the near future.  She is aware and agreeable.

## 2020-05-20 ENCOUNTER — Other Ambulatory Visit: Payer: Self-pay

## 2020-05-20 ENCOUNTER — Ambulatory Visit (HOSPITAL_COMMUNITY)
Admission: RE | Admit: 2020-05-20 | Discharge: 2020-05-20 | Disposition: A | Discharge status: Home / Self Care | Payer: PPO | Source: Ambulatory Visit | Attending: Cardiology | Admitting: Cardiology

## 2020-05-20 DIAGNOSIS — I5022 Chronic systolic (congestive) heart failure: Secondary | ICD-10-CM | POA: Diagnosis not present

## 2020-05-20 LAB — BASIC METABOLIC PANEL
Anion gap: 11 (ref 5–15)
BUN: 45 mg/dL — ABNORMAL HIGH (ref 8–23)
CO2: 31 mmol/L (ref 22–32)
Calcium: 8.9 mg/dL (ref 8.9–10.3)
Chloride: 94 mmol/L — ABNORMAL LOW (ref 98–111)
Creatinine, Ser: 2.19 mg/dL — ABNORMAL HIGH (ref 0.61–1.24)
GFR, Estimated: 29 mL/min — ABNORMAL LOW (ref >60)
Glucose, Bld: 154 mg/dL — ABNORMAL HIGH (ref 70–99)
Potassium: 4.1 mmol/L (ref 3.5–5.1)
Sodium: 136 mmol/L (ref 135–145)

## 2020-05-24 NOTE — Progress Notes (Signed)
EPIC Encounter for ICM Monitoring  Patient Name: [name]. is a 85 y.o. male Date: 05/24/2020 Primary Care Physican: Shirline Frees, MD Primary Cardiologist:McLean Electrophysiologist:Camnitz 04/06/2020 Weight:129lbs   Transmission reviewed.  CorVue thoracic impedancenormal.  Prescribed:  Torsemide20 mg take 2 tablets (40 mg total) every morning and 20 mg every evening.   Labs: 05/20/2020 Creatinine 2.19, BUN 45, Potassium 4.1, Sodium 136, GFR 29 05/10/2020 Creatinine 1.95, BUN 42, Potassium 4.3, Sodium 138, GFR 34  03/01/2020 Creatinine 2.69, BUN 48, Potassium 3.5, Sodium 137 11/26/2019 Creatinine 2.98, BUN 70, Potassium 3.7, Sodium 135, GFR 19-22 10/27/2019 Creatinine2.84, BUN70, Potassium 3.5, Sodium137, GFR20-23 10/05/2019 Creatinine3.09, BUN62, Potassium 4.2, Sodium130, [id number]  09/03/2019 Creatinine3.32, BUN63, Potassium 4.0, [id number], [id number]  08/16/2019 Creatinine3.48, BUN71, Potassium 4.0, Sodium140, [id number]  08/15/2019 Creatinine4.57, BUN94, Potassium 4.1, Sodium138, [id number]  08/14/2019 Creatinine5.11, BUN105, Potassium4.1, Sodium133, GFR10-11  08/13/2019 Creatinine5.10, BUN111, Potassium3.9, Sodium136, GFR10-11 A complete set of results can be found in Results Review.  Recommendations:  No changes.  Follow-up plan: ICM clinic phone appointment on4/06/2020. 91 day device clinic remote transmission3/22/2022.   EP/Cardiology Office Visits: 06/01/2020 with HF Clinic PA/NP. Recall for 07/07/2020 with Dr Angelena Form.Lastappointment with Dr Camnitz9/23/21 (no recall for 2022)  Copy of ICM check sent to [facility]  3 month ICM trend: 05/16/2020.    1 Year ICM trend:       Rosalene Billings, RN 05/24/2020 12:15 PM

## 2020-05-26 ENCOUNTER — Telehealth (HOSPITAL_COMMUNITY): Payer: Self-pay | Admitting: Surgery

## 2020-05-26 ENCOUNTER — Encounter (INDEPENDENT_AMBULATORY_CARE_PROVIDER_SITE_OTHER): Payer: PPO | Admitting: Cardiology

## 2020-05-26 DIAGNOSIS — G4733 Obstructive sleep apnea (adult) (pediatric): Secondary | ICD-10-CM

## 2020-05-26 NOTE — Telephone Encounter (Signed)
[name] called regarding his home sleep study.  I have instructed him again concerning the process.  He has had some trouble with the App and his Apple ID.  He says that he will "reset" his ID tonite and try again to get the App downloaded.  I told him to feel free to contact me back with any further issues.

## 2020-05-31 NOTE — Progress Notes (Signed)
PCP: Shirline Frees, MD Cardiology: Dr. Angelena Form HF Cardiology: Dr. Aundra Dubin Nephrology: Dr. Royce Macadamia  85 y.o. with h/o CAD s/p CABG, ischemic cardiomyopathy, and diabetes was referred by Dr. Angelena Form for evaluation of CHF.  Patient had CABG in 2007.  Last cath in 2016 showed all 4 grafts patent.  Echo was 25-30% in 2016.  Most recent echo in 8/20 showed EF 20-25% with moderate RV dysfunction.  He has had a recurrent right pleural effusion with thoracenteses in 5/19 and 8/20.  He thinks that he has been in atrial fibrillation persistently for about a year.  He used to take amiodarone but stopped it because he "felt bad."  However, he does not think that stopping amiodarone made him feel any better.  Creatinine has been elevated, CKD stage 3.   At a prior appt, I thought he was volume overloaded and increased Lasix, but his creatinine steadily rose (2.55 => 3.13 => 3.72). I stopped Entresto and he never started spironolactone.  Lasix was cut back to 60 mg daily. He says that during this time, he got his COVID vaccination and after that, he became severely nauseated for a number of days and ate/drank very little.  This eventually resolved.   On 05/11/19, I cardioverted him back to NSR.    Later in 2/21, he had RHC.  This showed elevated right and left heart filling pressures with low but not markedly low cardiac output.  He was admitted for IV diuresis.  While diuresing, he was maintained on milrinone.  This was weaned prior to discharge. PYP scan done in the hospital was equivocal for ATTR amyloidosis. He was discharged on torsemide.   He was seen by Darrick Grinder in 5/21 with significant weight loss.  He was thought to be volume depleted/dehydrated and creatinine was > 5.  He got IV fluid in the clinic but was still lightheaded and weak so was admitted.  He got IV fluid in the hospital and torsemide was stopped.  Weight started to rise, so he was started back on lower dose of torsemide, 40 qam/20 qpm.  Over  time, he dropped dose of torsemide down to 20 mg daily.   Echo was done (2/22), EF 25-30% with global hypokinesis with apical akinesis, mildly decreased RV systolic function, PASP 56, moderate MR, IVC dilated.   Today he returns for HF follow up. Last clinic visit (2/22) weight was up and Jardiance started, Verquvo and torsemide increased. Now, overall feeling fine. Here today with his wife. They are at [place]. Does not walk up stairs and uses a scooter when grocery shopping. Denies increasing SOB, CP, dizziness, edema, or PND/Orthopnea. Appetite poor, drinking Glucernas. No fever or chills. Weight at home 143 pounds. Taking all medications. On Aricept for mild dementia.   St Jude device interrogation: stable thoracic impedance suggestive of euvolemia.   ECG (personally reviewed): SB, 1st degree AVB, PACs, 59 bpm, QRS 122 ms.  Labs (12/20): K 4.5, creatinine 2.01 => 1.84  Labs (2/21): K 3.9, creatinine 2.55 => 3.13 => 3.72 Labs (3/21): hgb 11.1, K 3.6 => 4.6, creatinine 3.04 => 3.64, myeloma panel negative, urine immunofixation negative. TSH and LFTs normal.  Labs (5/21): K 4, creatinine 5.1 => 3.48 Labs (9/21): K 3.7, creatinine 2.98, BNP 1752 Labs (12/21): K 3.5, creatinine 2.69 (GFR 23), TSH normal, hgb 11.3 Labs (2/22): K 4.1, creatinine 2.19 (GFR 29), hgb 9.7, LDL 75, HDL 64  PMH: 1. Type 2 diabetes  2. HTN 3. Hyperlipidemia 4.  CAD: CABG x 4 in 2007 with LIMA-LAD, SVG-OM, sequential SVG-PLV/PDA.   - LHC (2016): Patent grafts.  5. Atrial fibrillation: Persistent.  - DCCV to NSR 2/21.  6. CKD stage 4.  7. Carotid stenosis:  - Carotid dopplers (8/20): 60-79% RICA stenosis.  - Carotid dopplers (10/21): 60-79% RICA stenosis.  8. Chronic systolic CHF: Ischemic cardiomyopathy.    - Echo (2016): EF 25-30% - Echo (2018): EF 25% - Echo (8/20): EF 20-25%, moderately decreased RV systolic function, severe LAE.  - St Jude ICD.   - RHC (3/21): mean RA 18, PA 71/27,  mean PCWP 24, CI 2.16 (thermo), CI 2.2 (Fick), PVR 4.6 WU, PAPi 2.4.  - PYP scan (3/21): grade 1 but H/CL ratio 1.6 => equivocal for TTR amyloidosis.   - PYP scan (5/21): grade 1, H/CL ratio 1.2 => not suggestive of TTR amyloidosis.  - Echo (2/22): EF 25-30% with global hypokinesis with apical akinesis, mildly decreased RV systolic function, PASP 56, moderate MR, IVC dilated.  9. Pleural effusion on right: s/p thoracentesis in 5/19 and 8/20.  10. Dementia: Mild.   Social History   Socioeconomic History  . Marital status: Married    Spouse name: Not on file  . Number of children: 2  . Years of education: Not on file  . Highest education level: Not on file  Occupational History  . Occupation: Horticulturist, commercial: RETIRED  Tobacco Use  . Smoking status: Former Smoker    Types: Cigarettes    Quit date: 03/26/1970    Years since quitting: 50.2  . Smokeless tobacco: Never Used  Vaping Use  . Vaping Use: Never used  Substance and Sexual Activity  . Alcohol use: Yes    Alcohol/week: 1.0 standard drink    Types: 1 Glasses of wine per week  . Drug use: No  . Sexual activity: Not on file  Other Topics Concern  . Not on file  Social History Narrative  . Not on file   Social Determinants of Health   Financial Resource Strain: Not on file  Food Insecurity: Not on file  Transportation Needs: No Transportation Needs  . Lack of Transportation (Medical): No  . Lack of Transportation (Non-Medical): No  Physical Activity: Inactive  . Days of Exercise per Week: 0 days  . Minutes of Exercise per Session: 0 min  Stress: Not on file  Social Connections: Not on file  Intimate Partner Violence: Not on file   Family History  Problem Relation Age of Onset  . Heart disease Father 40       Heart disease before age 70  . Heart attack Father   . Hypertension Father   . Hyperlipidemia Father   . Varicose Veins Father   . Stroke Father        ? not sure  . Colon cancer Maternal Aunt    . Colon cancer Maternal Uncle   . Diverticulitis Other   . Heart attack Other    ROS: All systems reviewed and negative except as per HPI.   Current Outpatient Medications  Medication Sig Dispense Refill  . amiodarone (PACERONE) 200 MG tablet Take 0.5 tablets (100 mg total) by mouth daily. 90 tablet 3  . apixaban (ELIQUIS) 2.5 MG TABS tablet Take 1 tablet (2.5 mg total) by mouth 2 (two) times daily. 180 tablet 1  . docusate sodium (COLACE) 50 MG capsule Take 100 mg by mouth daily.    [name] donepezil (ARICEPT) 5 MG tablet Take 10  mg by mouth at bedtime.    . empagliflozin (JARDIANCE) 10 MG TABS tablet Take 1 tablet (10 mg total) by mouth daily before breakfast. 90 tablet 3  . insulin degludec (TRESIBA FLEXTOUCH) 200 UNIT/ML FlexTouch Pen Inject 6 Units into the skin daily.    [name] omeprazole (PRILOSEC) 20 MG capsule Take 20 mg by mouth 2 (two) times daily before a meal.     . simvastatin (ZOCOR) 20 MG tablet Take 20 mg by mouth at bedtime.     . torsemide (DEMADEX) 20 MG tablet Take '40mg'$  (2 tablets) by mouth daily, alternating with '20mg'$  (1 tablet) by mouth daily. 90 tablet 3  . Vericiguat 10 MG TABS Take 10 mg by mouth daily. 30 tablet 11   No current facility-administered medications for this encounter.   BP 118/62   Pulse 64   Wt 67.8 kg (149 lb 6.4 oz)   SpO2 96%   BMI 20.84 kg/m   Wt Readings from Last 3 Encounters:  06/01/20 67.8 kg (149 lb 6.4 oz)  05/10/20 69.1 kg (152 lb 6.4 oz)  03/23/20 61.9 kg (136 lb 6.4 oz)   Physical Exam: General:  NAD. No resp difficulty, thin HEENT: Normal Neck: Supple. No JVD. Carotids 2+ bilat; no bruits. No lymphadenopathy or thryomegaly appreciated. Cor: PMI nondisplaced. Regular rate & rhythm. No rubs, gallops, II/VI HSM apex Lungs: Clear Abdomen: Soft, nontender, nondistended. No hepatosplenomegaly. No bruits or masses. Good bowel sounds. Extremities: No cyanosis, clubbing, rash, edema; venous stasis changes to bilateral lower  extremities Neuro: Alert & oriented x 3, cranial nerves grossly intact. Moves all 4 extremities w/o difficulty. Affect pleasant.  Assessment/Plan: 1. Chronic systolic CHF: Ischemic cardiomyopathy.  Echo in 8/20 with EF 20-25%, moderately decreased RV systolic function.  St Jude ICD.  CHF has been complicated by CKD stage 4.  RHC in 2/21 showed low but not markedly low cardiac output, he was admitted for diuresis with milrinone.  Milrinone was weaned off. Echo today showed EF 25-30% with global hypokinesis with apical akinesis, mildly decreased RV systolic function, PASP 56, moderate MR, IVC dilated. He is not volume overloaded by exam or Corvue, weight stable.  NYHA class II-III with unchanged symptoms.  - Continue Jardiance 10 mg daily. BMET today. - Continue torsemide 40 daily alternating with 20 mg daily.  - Continue Verquvo 10 mg daily. - He will stay off Entresto and spironolactone with elevated creatinine.  - He is off beta blocker with bradycardia.   - Narrow QRS, not candidate for upgrade to CRT.  - PYP scan in 5/21 not suggestive of transthyretin amyloidosis.   2. CAD: S/p CABG 2007, patent grafts on LHC in 2016. No exertional chest pain.  - Given stable CAD and Eliquis use, he is not on ASA.  - Continue statin.  3. Atrial fibrillation: Paroxysmal.  He remains in NSR today.   - Continue amiodarone 100 mg daily.  LFTs and TSH (2/22) ok.  He will need a regular eye exam while on amiodarone.  4. Right chronic pleural effusion: Only small effusion on last CXR. Lungs clear today. 5. CKD stage 4:  Sees nephrology.  BMET today.  6. Carotid stenosis: Repeat dopplers in 10/22.    7. Suspect OSA: Daytime sleepiness.  - Home sleep study pending.   Followup in 3 months with Dr. Wynema Birch [facility] FNP-BC 06/01/2020

## 2020-06-01 ENCOUNTER — Other Ambulatory Visit: Payer: Self-pay

## 2020-06-01 ENCOUNTER — Ambulatory Visit (HOSPITAL_COMMUNITY)
Admission: RE | Admit: 2020-06-01 | Discharge: 2020-06-01 | Disposition: A | Discharge status: Home / Self Care | Payer: PPO | Source: Ambulatory Visit | Attending: Family Medicine | Admitting: Family Medicine

## 2020-06-01 ENCOUNTER — Ambulatory Visit: Payer: PPO

## 2020-06-01 ENCOUNTER — Encounter (HOSPITAL_COMMUNITY): Payer: Self-pay

## 2020-06-01 VITALS — BP 118/62 | HR 64 | Wt 149.4 lb

## 2020-06-01 DIAGNOSIS — I2510 Atherosclerotic heart disease of native coronary artery without angina pectoris: Secondary | ICD-10-CM | POA: Insufficient documentation

## 2020-06-01 DIAGNOSIS — Z794 Long term (current) use of insulin: Secondary | ICD-10-CM | POA: Diagnosis not present

## 2020-06-01 DIAGNOSIS — I480 Paroxysmal atrial fibrillation: Secondary | ICD-10-CM | POA: Diagnosis not present

## 2020-06-01 DIAGNOSIS — I6529 Occlusion and stenosis of unspecified carotid artery: Secondary | ICD-10-CM | POA: Diagnosis not present

## 2020-06-01 DIAGNOSIS — I255 Ischemic cardiomyopathy: Secondary | ICD-10-CM | POA: Insufficient documentation

## 2020-06-01 DIAGNOSIS — Z79899 Other long term (current) drug therapy: Secondary | ICD-10-CM | POA: Insufficient documentation

## 2020-06-01 DIAGNOSIS — Z87891 Personal history of nicotine dependence: Secondary | ICD-10-CM | POA: Diagnosis not present

## 2020-06-01 DIAGNOSIS — Z95810 Presence of automatic (implantable) cardiac defibrillator: Secondary | ICD-10-CM | POA: Insufficient documentation

## 2020-06-01 DIAGNOSIS — I130 Hypertensive heart and chronic kidney disease with heart failure and stage 1 through stage 4 chronic kidney disease, or unspecified chronic kidney disease: Secondary | ICD-10-CM | POA: Insufficient documentation

## 2020-06-01 DIAGNOSIS — I5022 Chronic systolic (congestive) heart failure: Secondary | ICD-10-CM

## 2020-06-01 DIAGNOSIS — Z7901 Long term (current) use of anticoagulants: Secondary | ICD-10-CM | POA: Insufficient documentation

## 2020-06-01 DIAGNOSIS — J90 Pleural effusion, not elsewhere classified: Secondary | ICD-10-CM | POA: Diagnosis not present

## 2020-06-01 DIAGNOSIS — Z7984 Long term (current) use of oral hypoglycemic drugs: Secondary | ICD-10-CM | POA: Diagnosis not present

## 2020-06-01 DIAGNOSIS — Z951 Presence of aortocoronary bypass graft: Secondary | ICD-10-CM | POA: Insufficient documentation

## 2020-06-01 DIAGNOSIS — Z8249 Family history of ischemic heart disease and other diseases of the circulatory system: Secondary | ICD-10-CM | POA: Insufficient documentation

## 2020-06-01 DIAGNOSIS — G4730 Sleep apnea, unspecified: Secondary | ICD-10-CM | POA: Diagnosis not present

## 2020-06-01 DIAGNOSIS — E1122 Type 2 diabetes mellitus with diabetic chronic kidney disease: Secondary | ICD-10-CM | POA: Diagnosis not present

## 2020-06-01 DIAGNOSIS — I6523 Occlusion and stenosis of bilateral carotid arteries: Secondary | ICD-10-CM | POA: Diagnosis not present

## 2020-06-01 DIAGNOSIS — N184 Chronic kidney disease, stage 4 (severe): Secondary | ICD-10-CM | POA: Diagnosis not present

## 2020-06-01 LAB — BASIC METABOLIC PANEL
Anion gap: 11 (ref 5–15)
BUN: 46 mg/dL — ABNORMAL HIGH (ref 8–23)
CO2: 32 mmol/L (ref 22–32)
Calcium: 8.8 mg/dL — ABNORMAL LOW (ref 8.9–10.3)
Chloride: 93 mmol/L — ABNORMAL LOW (ref 98–111)
Creatinine, Ser: 2.46 mg/dL — ABNORMAL HIGH (ref 0.61–1.24)
GFR, Estimated: 26 mL/min — ABNORMAL LOW (ref >60)
Glucose, Bld: 119 mg/dL — ABNORMAL HIGH (ref 70–99)
Potassium: 3.8 mmol/L (ref 3.5–5.1)
Sodium: 136 mmol/L (ref 135–145)

## 2020-06-01 NOTE — Procedures (Signed)
    Sleep Study Report  Patient Information Name: [name] ID: [id number] Birth Date: 02/12/1936  Age: 85  Gender:Male Study Date:05/26/2020 Referring Physician: [name], MD  TEST DESCRIPTION: Home sleep apnea testing was completed using the WatchPat, a Type 1 device, utilizing  peripheral arterial tonometry (PAT), chest movement, actigraphy, pulse oximetry, pulse rate, body position and snore.  AHI was calculated with apnea and hypopnea using valid sleep time as the denominator. RDI includes apneas,  hypopneas, and RERAs. The data acquired and the scoring of sleep and all associated events were performed in  accordance with the recommended standards and specifications as outlined in the AASM Manual for the Scoring of  Sleep and Associated Events 2.2.0 (2015).  FINDINGS: 1. Moderate Obstructive Sleep Apnea with AHI 22/hr.  2. No Central Sleep Apnea with pAHIc 0.9/hr. 3. Oxygen desaturations as low as 82%. 4. Moderate snoring was present. O2 sats were < 88% for 3.6 min. 5. Total sleep time was 6 hrs and 36 min. 6. 7.7% of total sleep time was spent in REM sleep.  7. Normal sleep onset latency at 25 min.  8. Shortened REM sleep onset latency at 50 min.  9. Total awakenings were 25.   DIAGNOSIS:  Moderate Obstructive Sleep Apnea (G47.33)  RECOMMENDATIONS: 1. Clinical correlation of these findings is necessary. The decision to treat obstructive sleep apnea (OSA) is usually  based on the presence of apnea symptoms or the presence of associated medical conditions such as Hypertension,  Congestive Heart Failure, Atrial Fibrillation or Obesity. The most common symptoms of OSA are snoring, gasping for  breath while sleeping, daytime sleepiness and fatigue.   2. Initiating apnea therapy is recommended given the presence of symptoms and/or associated conditions. Recommend proceeding with one of the following:   a. Auto-CPAP therapy with a pressure range of 5-20cm H2O.    b. An oral appliance (OA) that can be obtained from certain dentists with expertise in sleep medicine. These are  primarily of use in non-obese patients with mild and moderate disease.   c. An ENT consultation which may be useful to look for specific causes of obstruction and possible treatment  Options.   d. If patient is intolerant to PAP therapy, consider referral to ENT for evaluation for hypoglossal nerve stimulator.   3. Close follow-up is necessary to ensure success with CPAP or oral appliance therapy for maximum benefit .  4. A follow-up oximetry study on CPAP is recommended to assess the adequacy of therapy and determine the need  for supplemental oxygen or the potential need for Bi-level therapy. An arterial blood gas to determine the adequacy of  baseline ventilation and oxygenation should also be considered.  5. Healthy sleep recommendations include: adequate nightly sleep (normal 7-9 hrs/night), avoidance of caffeine after  noon and alcohol near bedtime, and maintaining a sleep environment that is cool, dark and quiet.  6. Weight loss for overweight patients is recommended. Even modest amounts of weight loss can significantly  improve the severity of sleep apnea.  7. Snoring recommendations include: weight loss where appropriate, side sleeping, and avoidance of alcohol before  Bed.  8. Operation of motor vehicle or dangerous equipment must be avoided when feeling drowsy, excessively sleepy, or  mentally fatigued.  Report prepared by: Signature: Fransico Him, MD [facility] [place] Board of Sleep Medicine Electronically Signed: Jun 01, 2020

## 2020-06-01 NOTE — Patient Instructions (Signed)
No medication changes today  Labs today We will only contact you if something comes back abnormal or we need to make some changes. Otherwise no news is good news!  Your physician recommends that you schedule a follow-up appointment in: 3 months with Dr Mclean  Please call office at [phone] option 2 if you have any questions or concerns.   At the [facility], you and your health needs are our priority. As part of our continuing mission to provide you with exceptional heart care, we have created designated Provider Care Teams. These Care Teams include your primary Cardiologist (physician) and Advanced Practice Providers (APPs- Physician Assistants and Nurse Practitioners) who all work together to provide you with the care you need, when you need it.   You may see any of the following providers on your designated Care Team at your next follow up: . Dr Daniel Bensimhon . Dr Dalton McLean . Dr Brandon Winfrey . Amy Clegg, NP . Brittainy Simmons, PA . Jessica Milford,NP . Lauren Kemp, PharmD   Please be sure to bring in all your medications bottles to every appointment.     

## 2020-06-02 ENCOUNTER — Telehealth: Payer: Self-pay | Admitting: *Deleted

## 2020-06-02 NOTE — Telephone Encounter (Signed)
-----   Message from Sueanne Margarita, MD sent at 06/01/2020  7:00 PM EST ----- Please let patient know that they have sleep apnea.  Order ResMed auto CPAP from 4 to 20cm H2O with heated humidity and mask of choice. Followup with me in 6 weeks.

## 2020-06-02 NOTE — Telephone Encounter (Signed)
Informed patient of sleep study results and patient understanding was verbalized. Patient understands her sleep study showed they have sleep apnea. Order ResMed auto CPAP from 4 to 20cm H2O with heated humidity and mask of choice. Followup with me in 6 weeks.    Upon patient request DME selection is [facility]. Patient understands she/he will be contacted by [facility] to set up her/he cpap. Patient understands to call if [facility] does not contact her/he with new setup in a timely manner. Patient understands they will be called once confirmation has been received from [facility] that they have received their new machine to schedule 10 week follow up appointment.   [facility] notified of new cpap order  Please add to airview Patient was grateful for the call and thanked me.  Left detailed message on voicemail and informed patient to call back with questions

## 2020-06-14 ENCOUNTER — Telehealth (HOSPITAL_COMMUNITY): Payer: Self-pay | Admitting: *Deleted

## 2020-06-14 DIAGNOSIS — I5022 Chronic systolic (congestive) heart failure: Secondary | ICD-10-CM

## 2020-06-14 NOTE — Telephone Encounter (Signed)
Per Dr Aundra Dubin pt contacted him regarding edema, he advised pt to increase Torsemide to 40 mg in AM and 20 mg in PM for 4 days then 40 mg Daily, needs bmet and ReDS clip next week.  Pt aware, agreeable, and verbalized understanding, nurse visit sch for Fri 4/1

## 2020-06-15 ENCOUNTER — Ambulatory Visit (INDEPENDENT_AMBULATORY_CARE_PROVIDER_SITE_OTHER): Payer: PPO

## 2020-06-15 DIAGNOSIS — I5022 Chronic systolic (congestive) heart failure: Secondary | ICD-10-CM

## 2020-06-15 DIAGNOSIS — I255 Ischemic cardiomyopathy: Secondary | ICD-10-CM

## 2020-06-15 LAB — CUP PACEART REMOTE DEVICE CHECK
Battery Remaining Longevity: 78 mo
Battery Remaining Percentage: 75 %
Battery Voltage: 2.98 V
Brady Statistic RV Percent Paced: 1 %
Date Time Interrogation Session: 20220323020017
HighPow Impedance: 54 Ohm
HighPow Impedance: 54 Ohm
Implantable Lead Implant Date: 20190319
Implantable Lead Location: 753860
Implantable Pulse Generator Implant Date: 20190319
Lead Channel Impedance Value: 350 Ohm
Lead Channel Pacing Threshold Amplitude: 0.75 V
Lead Channel Pacing Threshold Pulse Width: 0.5 ms
Lead Channel Sensing Intrinsic Amplitude: 9.4 mV
Lead Channel Setting Pacing Amplitude: 2.5 V
Lead Channel Setting Pacing Pulse Width: 0.5 ms
Lead Channel Setting Sensing Sensitivity: 0.5 mV
Pulse Gen Serial Number: 9811279

## 2020-06-22 DIAGNOSIS — E113593 Type 2 diabetes mellitus with proliferative diabetic retinopathy without macular edema, bilateral: Secondary | ICD-10-CM | POA: Diagnosis not present

## 2020-06-22 DIAGNOSIS — H35371 Puckering of macula, right eye: Secondary | ICD-10-CM | POA: Diagnosis not present

## 2020-06-22 DIAGNOSIS — H43812 Vitreous degeneration, left eye: Secondary | ICD-10-CM | POA: Diagnosis not present

## 2020-06-23 DIAGNOSIS — E1122 Type 2 diabetes mellitus with diabetic chronic kidney disease: Secondary | ICD-10-CM | POA: Diagnosis not present

## 2020-06-23 DIAGNOSIS — I129 Hypertensive chronic kidney disease with stage 1 through stage 4 chronic kidney disease, or unspecified chronic kidney disease: Secondary | ICD-10-CM | POA: Diagnosis not present

## 2020-06-23 DIAGNOSIS — E782 Mixed hyperlipidemia: Secondary | ICD-10-CM | POA: Diagnosis not present

## 2020-06-23 DIAGNOSIS — K219 Gastro-esophageal reflux disease without esophagitis: Secondary | ICD-10-CM | POA: Diagnosis not present

## 2020-06-23 DIAGNOSIS — N183 Chronic kidney disease, stage 3 unspecified: Secondary | ICD-10-CM | POA: Diagnosis not present

## 2020-06-23 DIAGNOSIS — I2510 Atherosclerotic heart disease of native coronary artery without angina pectoris: Secondary | ICD-10-CM | POA: Diagnosis not present

## 2020-06-23 DIAGNOSIS — I480 Paroxysmal atrial fibrillation: Secondary | ICD-10-CM | POA: Diagnosis not present

## 2020-06-23 DIAGNOSIS — D649 Anemia, unspecified: Secondary | ICD-10-CM | POA: Diagnosis not present

## 2020-06-23 DIAGNOSIS — I10 Essential (primary) hypertension: Secondary | ICD-10-CM | POA: Diagnosis not present

## 2020-06-24 ENCOUNTER — Inpatient Hospital Stay (HOSPITAL_COMMUNITY)
Admission: EM | Admit: 2020-06-24 | Discharge: 2020-06-29 | DRG: 291 | Disposition: A | Discharge status: Hospice | Payer: Medicare HMO | Attending: Cardiology | Admitting: Cardiology

## 2020-06-24 ENCOUNTER — Emergency Department (HOSPITAL_COMMUNITY): Payer: Medicare HMO

## 2020-06-24 ENCOUNTER — Encounter (HOSPITAL_COMMUNITY): Payer: Self-pay

## 2020-06-24 ENCOUNTER — Other Ambulatory Visit: Payer: Self-pay

## 2020-06-24 ENCOUNTER — Emergency Department: Payer: Self-pay

## 2020-06-24 ENCOUNTER — Ambulatory Visit (HOSPITAL_BASED_OUTPATIENT_CLINIC_OR_DEPARTMENT_OTHER)
Admission: RE | Admit: 2020-06-24 | Discharge: 2020-06-24 | Disposition: A | Discharge status: Home / Self Care | Payer: Medicare HMO | Source: Ambulatory Visit | Attending: Cardiology | Admitting: Cardiology

## 2020-06-24 DIAGNOSIS — I5043 Acute on chronic combined systolic (congestive) and diastolic (congestive) heart failure: Secondary | ICD-10-CM | POA: Diagnosis not present

## 2020-06-24 DIAGNOSIS — D61818 Other pancytopenia: Secondary | ICD-10-CM | POA: Diagnosis present

## 2020-06-24 DIAGNOSIS — I517 Cardiomegaly: Secondary | ICD-10-CM | POA: Diagnosis not present

## 2020-06-24 DIAGNOSIS — Z8249 Family history of ischemic heart disease and other diseases of the circulatory system: Secondary | ICD-10-CM | POA: Insufficient documentation

## 2020-06-24 DIAGNOSIS — I130 Hypertensive heart and chronic kidney disease with heart failure and stage 1 through stage 4 chronic kidney disease, or unspecified chronic kidney disease: Secondary | ICD-10-CM | POA: Diagnosis not present

## 2020-06-24 DIAGNOSIS — G4733 Obstructive sleep apnea (adult) (pediatric): Secondary | ICD-10-CM | POA: Diagnosis present

## 2020-06-24 DIAGNOSIS — I480 Paroxysmal atrial fibrillation: Secondary | ICD-10-CM | POA: Diagnosis present

## 2020-06-24 DIAGNOSIS — I5084 End stage heart failure: Secondary | ICD-10-CM | POA: Diagnosis present

## 2020-06-24 DIAGNOSIS — J90 Pleural effusion, not elsewhere classified: Secondary | ICD-10-CM | POA: Insufficient documentation

## 2020-06-24 DIAGNOSIS — N179 Acute kidney failure, unspecified: Secondary | ICD-10-CM | POA: Diagnosis not present

## 2020-06-24 DIAGNOSIS — I255 Ischemic cardiomyopathy: Secondary | ICD-10-CM | POA: Diagnosis present

## 2020-06-24 DIAGNOSIS — Z66 Do not resuscitate: Secondary | ICD-10-CM | POA: Diagnosis not present

## 2020-06-24 DIAGNOSIS — R946 Abnormal results of thyroid function studies: Secondary | ICD-10-CM | POA: Diagnosis present

## 2020-06-24 DIAGNOSIS — I2510 Atherosclerotic heart disease of native coronary artery without angina pectoris: Secondary | ICD-10-CM | POA: Diagnosis present

## 2020-06-24 DIAGNOSIS — Z7984 Long term (current) use of oral hypoglycemic drugs: Secondary | ICD-10-CM | POA: Diagnosis not present

## 2020-06-24 DIAGNOSIS — Z7901 Long term (current) use of anticoagulants: Secondary | ICD-10-CM | POA: Diagnosis not present

## 2020-06-24 DIAGNOSIS — Z951 Presence of aortocoronary bypass graft: Secondary | ICD-10-CM

## 2020-06-24 DIAGNOSIS — Z515 Encounter for palliative care: Secondary | ICD-10-CM | POA: Diagnosis not present

## 2020-06-24 DIAGNOSIS — I6529 Occlusion and stenosis of unspecified carotid artery: Secondary | ICD-10-CM | POA: Diagnosis present

## 2020-06-24 DIAGNOSIS — I509 Heart failure, unspecified: Secondary | ICD-10-CM

## 2020-06-24 DIAGNOSIS — I5023 Acute on chronic systolic (congestive) heart failure: Secondary | ICD-10-CM | POA: Diagnosis present

## 2020-06-24 DIAGNOSIS — I4819 Other persistent atrial fibrillation: Secondary | ICD-10-CM | POA: Insufficient documentation

## 2020-06-24 DIAGNOSIS — Z79899 Other long term (current) drug therapy: Secondary | ICD-10-CM | POA: Insufficient documentation

## 2020-06-24 DIAGNOSIS — Z87891 Personal history of nicotine dependence: Secondary | ICD-10-CM | POA: Insufficient documentation

## 2020-06-24 DIAGNOSIS — I272 Pulmonary hypertension, unspecified: Secondary | ICD-10-CM | POA: Diagnosis present

## 2020-06-24 DIAGNOSIS — R9431 Abnormal electrocardiogram [ECG] [EKG]: Secondary | ICD-10-CM | POA: Diagnosis present

## 2020-06-24 DIAGNOSIS — Z7189 Other specified counseling: Secondary | ICD-10-CM

## 2020-06-24 DIAGNOSIS — Z20822 Contact with and (suspected) exposure to covid-19: Secondary | ICD-10-CM | POA: Diagnosis present

## 2020-06-24 DIAGNOSIS — I080 Rheumatic disorders of both mitral and aortic valves: Secondary | ICD-10-CM | POA: Diagnosis present

## 2020-06-24 DIAGNOSIS — E785 Hyperlipidemia, unspecified: Secondary | ICD-10-CM | POA: Diagnosis present

## 2020-06-24 DIAGNOSIS — I5022 Chronic systolic (congestive) heart failure: Secondary | ICD-10-CM | POA: Diagnosis not present

## 2020-06-24 DIAGNOSIS — R001 Bradycardia, unspecified: Secondary | ICD-10-CM | POA: Diagnosis not present

## 2020-06-24 DIAGNOSIS — I252 Old myocardial infarction: Secondary | ICD-10-CM

## 2020-06-24 DIAGNOSIS — F0390 Unspecified dementia without behavioral disturbance: Secondary | ICD-10-CM | POA: Insufficient documentation

## 2020-06-24 DIAGNOSIS — R531 Weakness: Secondary | ICD-10-CM

## 2020-06-24 DIAGNOSIS — E1129 Type 2 diabetes mellitus with other diabetic kidney complication: Secondary | ICD-10-CM | POA: Diagnosis present

## 2020-06-24 DIAGNOSIS — N184 Chronic kidney disease, stage 4 (severe): Secondary | ICD-10-CM | POA: Diagnosis not present

## 2020-06-24 DIAGNOSIS — R0602 Shortness of breath: Secondary | ICD-10-CM | POA: Insufficient documentation

## 2020-06-24 DIAGNOSIS — I10 Essential (primary) hypertension: Secondary | ICD-10-CM | POA: Diagnosis present

## 2020-06-24 DIAGNOSIS — R410 Disorientation, unspecified: Secondary | ICD-10-CM | POA: Diagnosis not present

## 2020-06-24 DIAGNOSIS — R4781 Slurred speech: Secondary | ICD-10-CM | POA: Insufficient documentation

## 2020-06-24 DIAGNOSIS — E1122 Type 2 diabetes mellitus with diabetic chronic kidney disease: Secondary | ICD-10-CM | POA: Diagnosis present

## 2020-06-24 DIAGNOSIS — Z794 Long term (current) use of insulin: Secondary | ICD-10-CM | POA: Insufficient documentation

## 2020-06-24 DIAGNOSIS — R0601 Orthopnea: Secondary | ICD-10-CM | POA: Insufficient documentation

## 2020-06-24 DIAGNOSIS — R29818 Other symptoms and signs involving the nervous system: Secondary | ICD-10-CM | POA: Diagnosis not present

## 2020-06-24 DIAGNOSIS — D696 Thrombocytopenia, unspecified: Secondary | ICD-10-CM | POA: Diagnosis present

## 2020-06-24 DIAGNOSIS — N183 Chronic kidney disease, stage 3 unspecified: Secondary | ICD-10-CM | POA: Diagnosis present

## 2020-06-24 DIAGNOSIS — I440 Atrioventricular block, first degree: Secondary | ICD-10-CM | POA: Diagnosis present

## 2020-06-24 DIAGNOSIS — E7800 Pure hypercholesterolemia, unspecified: Secondary | ICD-10-CM | POA: Diagnosis present

## 2020-06-24 DIAGNOSIS — Z83438 Family history of other disorder of lipoprotein metabolism and other lipidemia: Secondary | ICD-10-CM

## 2020-06-24 LAB — COMPREHENSIVE METABOLIC PANEL
ALT: 27 U/L (ref 0–44)
ALT: 28 U/L (ref 0–44)
AST: 41 U/L (ref 15–41)
AST: 43 U/L — ABNORMAL HIGH (ref 15–41)
Albumin: 3.2 g/dL — ABNORMAL LOW (ref 3.5–5.0)
Albumin: 3.3 g/dL — ABNORMAL LOW (ref 3.5–5.0)
Alkaline Phosphatase: 112 U/L (ref 38–126)
Alkaline Phosphatase: 114 U/L (ref 38–126)
Anion gap: 11 (ref 5–15)
Anion gap: 9 (ref 5–15)
BUN: 72 mg/dL — ABNORMAL HIGH (ref 8–23)
BUN: 73 mg/dL — ABNORMAL HIGH (ref 8–23)
CO2: 32 mmol/L (ref 22–32)
CO2: 33 mmol/L — ABNORMAL HIGH (ref 22–32)
Calcium: 8.9 mg/dL (ref 8.9–10.3)
Calcium: 9 mg/dL (ref 8.9–10.3)
Chloride: 95 mmol/L — ABNORMAL LOW (ref 98–111)
Chloride: 95 mmol/L — ABNORMAL LOW (ref 98–111)
Creatinine, Ser: 3.37 mg/dL — ABNORMAL HIGH (ref 0.61–1.24)
Creatinine, Ser: 3.41 mg/dL — ABNORMAL HIGH (ref 0.61–1.24)
GFR, Estimated: 17 mL/min — ABNORMAL LOW (ref >60)
GFR, Estimated: 17 mL/min — ABNORMAL LOW (ref >60)
Glucose, Bld: 118 mg/dL — ABNORMAL HIGH (ref 70–99)
Glucose, Bld: 126 mg/dL — ABNORMAL HIGH (ref 70–99)
Potassium: 4.1 mmol/L (ref 3.5–5.1)
Potassium: 4.1 mmol/L (ref 3.5–5.1)
Sodium: 137 mmol/L (ref 135–145)
Sodium: 138 mmol/L (ref 135–145)
Total Bilirubin: 1 mg/dL (ref 0.3–1.2)
Total Bilirubin: 1.1 mg/dL (ref 0.3–1.2)
Total Protein: 6.4 g/dL — ABNORMAL LOW (ref 6.5–8.1)
Total Protein: 6.5 g/dL (ref 6.5–8.1)

## 2020-06-24 LAB — I-STAT CHEM 8, ED
BUN: 68 mg/dL — ABNORMAL HIGH (ref 8–23)
Calcium, Ion: 1.03 mmol/L — ABNORMAL LOW (ref 1.15–1.40)
Chloride: 96 mmol/L — ABNORMAL LOW (ref 98–111)
Creatinine, Ser: 3.4 mg/dL — ABNORMAL HIGH (ref 0.61–1.24)
Glucose, Bld: 119 mg/dL — ABNORMAL HIGH (ref 70–99)
HCT: 32 % — ABNORMAL LOW (ref 39.0–52.0)
Hemoglobin: 10.9 g/dL — ABNORMAL LOW (ref 13.0–17.0)
Potassium: 4.1 mmol/L (ref 3.5–5.1)
Sodium: 137 mmol/L (ref 135–145)
TCO2: 31 mmol/L (ref 22–32)

## 2020-06-24 LAB — SARS CORONAVIRUS 2 (TAT 6-24 HRS): SARS Coronavirus 2: NEGATIVE

## 2020-06-24 LAB — CBC
HCT: 32.3 % — ABNORMAL LOW (ref 39.0–52.0)
Hemoglobin: 10.3 g/dL — ABNORMAL LOW (ref 13.0–17.0)
MCH: 29.3 pg (ref 26.0–34.0)
MCHC: 31.9 g/dL (ref 30.0–36.0)
MCV: 91.8 fL (ref 80.0–100.0)
Platelets: 121 10*3/uL — ABNORMAL LOW (ref 150–400)
RBC: 3.52 MIL/uL — ABNORMAL LOW (ref 4.22–5.81)
RDW: 16.4 % — ABNORMAL HIGH (ref 11.5–15.5)
WBC: 5.3 10*3/uL (ref 4.0–10.5)
nRBC: 0 % (ref 0.0–0.2)

## 2020-06-24 LAB — CBC WITH DIFFERENTIAL/PLATELET
Abs Immature Granulocytes: 0.02 10*3/uL (ref 0.00–0.07)
Basophils Absolute: 0 10*3/uL (ref 0.0–0.1)
Basophils Relative: 1 %
Eosinophils Absolute: 0 10*3/uL (ref 0.0–0.5)
Eosinophils Relative: 1 %
HCT: 32.9 % — ABNORMAL LOW (ref 39.0–52.0)
Hemoglobin: 10.5 g/dL — ABNORMAL LOW (ref 13.0–17.0)
Immature Granulocytes: 0 %
Lymphocytes Relative: 9 %
Lymphs Abs: 0.4 10*3/uL — ABNORMAL LOW (ref 0.7–4.0)
MCH: 29.3 pg (ref 26.0–34.0)
MCHC: 31.9 g/dL (ref 30.0–36.0)
MCV: 91.9 fL (ref 80.0–100.0)
Monocytes Absolute: 0.4 10*3/uL (ref 0.1–1.0)
Monocytes Relative: 8 %
Neutro Abs: 4 10*3/uL (ref 1.7–7.7)
Neutrophils Relative %: 81 %
Platelets: 116 10*3/uL — ABNORMAL LOW (ref 150–400)
RBC: 3.58 MIL/uL — ABNORMAL LOW (ref 4.22–5.81)
RDW: 16.3 % — ABNORMAL HIGH (ref 11.5–15.5)
WBC: 4.9 10*3/uL (ref 4.0–10.5)
nRBC: 0 % (ref 0.0–0.2)

## 2020-06-24 LAB — GLUCOSE, CAPILLARY: Glucose-Capillary: 106 mg/dL — ABNORMAL HIGH (ref 70–99)

## 2020-06-24 LAB — T4, FREE: Free T4: 1.32 ng/dL — ABNORMAL HIGH (ref 0.61–1.12)

## 2020-06-24 LAB — LACTIC ACID, PLASMA
Lactic Acid, Venous: 1.2 mmol/L (ref 0.5–1.9)
Lactic Acid, Venous: 1.4 mmol/L (ref 0.5–1.9)

## 2020-06-24 LAB — HEMOGLOBIN A1C
Hgb A1c MFr Bld: 7.5 % — ABNORMAL HIGH (ref 4.8–5.6)
Mean Plasma Glucose: 168.55 mg/dL

## 2020-06-24 LAB — PROTIME-INR
INR: 1.4 — ABNORMAL HIGH (ref 0.8–1.2)
Prothrombin Time: 16.4 seconds — ABNORMAL HIGH (ref 11.4–15.2)

## 2020-06-24 LAB — TSH: TSH: 10.242 u[IU]/mL — ABNORMAL HIGH (ref 0.350–4.500)

## 2020-06-24 LAB — TROPONIN I (HIGH SENSITIVITY)
Troponin I (High Sensitivity): 28 ng/L — ABNORMAL HIGH (ref <18)
Troponin I (High Sensitivity): 26 ng/L — ABNORMAL HIGH (ref <18)

## 2020-06-24 LAB — BRAIN NATRIURETIC PEPTIDE: B Natriuretic Peptide: 1353.5 pg/mL — ABNORMAL HIGH (ref 0.0–100.0)

## 2020-06-24 LAB — MAGNESIUM: Magnesium: 2.6 mg/dL — ABNORMAL HIGH (ref 1.7–2.4)

## 2020-06-24 LAB — AMMONIA: Ammonia: 25 umol/L (ref 9–35)

## 2020-06-24 LAB — APTT: aPTT: 47 seconds — ABNORMAL HIGH (ref 24–36)

## 2020-06-24 MED ORDER — SODIUM CHLORIDE 0.9 % IV SOLN: 250.0000 mL | INTRAVENOUS | Status: DC | PRN

## 2020-06-24 MED ORDER — CHLORHEXIDINE GLUCONATE CLOTH 2 % EX PADS
6.0000 | MEDICATED_PAD | Freq: Every day | CUTANEOUS | Status: DC
  Administered 2020-06-25 – 2020-06-28 (×4): 6 via TOPICAL

## 2020-06-24 MED ORDER — VERICIGUAT 10 MG PO TABS
10.0000 mg | ORAL_TABLET | Freq: Every day | ORAL | Status: DC
  Administered 2020-06-25 – 2020-06-26 (×2): 10 mg via ORAL
  Filled 2020-06-24 (×2): qty 1

## 2020-06-24 MED ORDER — PANTOPRAZOLE SODIUM 40 MG PO TBEC
40.0000 mg | DELAYED_RELEASE_TABLET | Freq: Every day | ORAL | Status: DC
  Administered 2020-06-25 – 2020-06-29 (×5): 40 mg via ORAL
  Filled 2020-06-24 (×5): qty 1

## 2020-06-24 MED ORDER — ACETAMINOPHEN 325 MG PO TABS
650.0000 mg | ORAL_TABLET | ORAL | Status: DC | PRN
  Administered 2020-06-25: 650 mg via ORAL

## 2020-06-24 MED ORDER — SIMVASTATIN 20 MG PO TABS
20.0000 mg | ORAL_TABLET | Freq: Every day | ORAL | Status: DC
  Filled 2020-06-24: qty 1

## 2020-06-24 MED ORDER — INSULIN ASPART 100 UNIT/ML ~~LOC~~ SOLN: 0.0000 [IU] | Freq: Every day | SUBCUTANEOUS | Status: DC

## 2020-06-24 MED ORDER — APIXABAN 2.5 MG PO TABS
2.5000 mg | ORAL_TABLET | Freq: Two times a day (BID) | ORAL | Status: DC
  Administered 2020-06-24 – 2020-06-29 (×10): 2.5 mg via ORAL
  Filled 2020-06-24 (×11): qty 1

## 2020-06-24 MED ORDER — SODIUM CHLORIDE 0.9% FLUSH
10.0000 mL | INTRAVENOUS | Status: DC | PRN
Start: 2020-06-24 — End: 2020-06-29

## 2020-06-24 MED ORDER — ONDANSETRON HCL 4 MG/2ML IJ SOLN: 4.0000 mg | Freq: Four times a day (QID) | INTRAMUSCULAR | Status: DC | PRN

## 2020-06-24 MED ORDER — APIXABAN 2.5 MG PO TABS: 2.5000 mg | ORAL_TABLET | Freq: Two times a day (BID) | ORAL | Status: DC

## 2020-06-24 MED ORDER — SODIUM CHLORIDE 0.9% FLUSH
3.0000 mL | Freq: Once | INTRAVENOUS | Status: DC
Start: 2020-06-24 — End: 2020-06-29

## 2020-06-24 MED ORDER — FUROSEMIDE 10 MG/ML IJ SOLN
15.0000 mg/h | INTRAVENOUS | Status: DC
  Administered 2020-06-24 – 2020-06-25 (×2): 10 mg/h via INTRAVENOUS
  Administered 2020-06-26: 15 mg/h via INTRAVENOUS
  Administered 2020-06-26: 10 mg/h via INTRAVENOUS
  Filled 2020-06-24 (×6): qty 20

## 2020-06-24 MED ORDER — SODIUM CHLORIDE 0.9% FLUSH
10.0000 mL | Freq: Two times a day (BID) | INTRAVENOUS | Status: DC
  Administered 2020-06-24 – 2020-06-28 (×6): 10 mL

## 2020-06-24 MED ORDER — DONEPEZIL HCL 10 MG PO TABS
10.0000 mg | ORAL_TABLET | Freq: Every day | ORAL | Status: DC
  Administered 2020-06-24 – 2020-06-28 (×5): 10 mg via ORAL
  Filled 2020-06-24 (×7): qty 1

## 2020-06-24 MED ORDER — FUROSEMIDE 10 MG/ML IJ SOLN
40.0000 mg | Freq: Once | INTRAMUSCULAR | Status: AC
  Administered 2020-06-24: 40 mg via INTRAVENOUS
  Filled 2020-06-24: qty 4

## 2020-06-24 MED ORDER — INSULIN ASPART 100 UNIT/ML ~~LOC~~ SOLN
0.0000 [IU] | Freq: Three times a day (TID) | SUBCUTANEOUS | Status: DC
  Administered 2020-06-25: 2 [IU] via SUBCUTANEOUS
  Administered 2020-06-26 – 2020-06-28 (×2): 3 [IU] via SUBCUTANEOUS

## 2020-06-24 MED ORDER — SIMVASTATIN 20 MG PO TABS: 20.0000 mg | ORAL_TABLET | Freq: Every day | ORAL | Status: DC

## 2020-06-24 MED ORDER — MILRINONE LACTATE IN DEXTROSE 20-5 MG/100ML-% IV SOLN
0.1250 ug/kg/min | INTRAVENOUS | Status: DC
  Administered 2020-06-24 – 2020-06-28 (×6): 0.25 ug/kg/min via INTRAVENOUS
  Administered 2020-06-29: 0.125 ug/kg/min via INTRAVENOUS
  Filled 2020-06-24 (×7): qty 100

## 2020-06-24 MED ORDER — SODIUM CHLORIDE 0.9% FLUSH
3.0000 mL | Freq: Two times a day (BID) | INTRAVENOUS | Status: DC
  Administered 2020-06-24 – 2020-06-28 (×7): 3 mL via INTRAVENOUS

## 2020-06-24 MED ORDER — SODIUM CHLORIDE 0.9% FLUSH: 3.0000 mL | INTRAVENOUS | Status: DC | PRN

## 2020-06-24 MED ORDER — SILVER SULFADIAZINE 1 % EX CREA
1.0000 "application " | TOPICAL_CREAM | Freq: Every day | CUTANEOUS | Status: DC | PRN
  Filled 2020-06-24: qty 85

## 2020-06-24 NOTE — ED Notes (Signed)
Unable to obtain oral temp 

## 2020-06-24 NOTE — ED Notes (Signed)
IV team repaged for the 3rd time. Pt has orders for Milrinone and per pharmacist, PICC line is needed to start med.

## 2020-06-24 NOTE — Consult Note (Deleted)
Advanced Heart Failure Team Consult Note   Primary Physician: Shirline Frees, MD PCP-Cardiologist:  [name], MD  [facility]: Dr. Aundra Dubin   Reason for Consultation: acute on chronic systolic heart failure   HPI:    [name]. is seen today for evaluation of acute on chronic systolic heart failure at the request of Dr. Reather Converse, emergency medicine.   85 y.o. with h/o CAD s/p CABG, ischemic cardiomyopathy, and diabetes was referred by Dr. Angelena Form for evaluation of CHF.  Patient had CABG in 2007.  Last cath in 2016 showed all 4 grafts patent.  Echo was 25-30% in 2016.  Most recent echo in 8/20 showed EF 20-25% with moderate RV dysfunction.  He has had a recurrent right pleural effusion with thoracenteses in 5/19 and 8/20.  He thinks that he had been in atrial fibrillation persistently for about a year.  He used to take amiodarone but stopped it because he "felt bad."  However, he does not think that stopping amiodarone made him feel any better.  Creatinine had been elevated, CKD stage 3.   At a prior appt, I thought he was volume overloaded and increased Lasix, but his creatinine steadily rose (2.55 => 3.13 => 3.72). I stopped Entresto and he never started spironolactone. Lasix was cut back to 60 mg daily. He says that during this time, he got his COVID vaccination and after that, he became severely nauseated for a number of days and ate/drank very little.  This eventually resolved.   On 05/11/19, I cardioverted him back to NSR.    Later in 2/21, he had RHC.  This showed elevated right and left heart filling pressures with low but not markedly low cardiac output.  He was admitted for IV diuresis.  While diuresing, he was maintained on milrinone.  This was weaned prior to discharge. PYP scan done in the hospital was equivocal for ATTR amyloidosis. He was discharged on torsemide.   He was seen by Darrick Grinder in 5/21 with significant weight loss.  He was thought to be volume  depleted/dehydrated and creatinine was > 5.  He got IV fluid in the clinic but was still lightheaded and weak so was admitted.  He got IV fluid in the hospital and torsemide was stopped.  Weight started to rise, so he was started back on lower dose of torsemide, 40 qam/20 qpm.  Over time, he dropped dose of torsemide down to 20 mg daily.   Echo in 2/22 showed EF 25-30% with global hypokinesis with apical akinesis, mildly decreased RV systolic function, PASP 56, moderate MR, IVC dilated.   He called for a work-in appointment today.  He was seen by our NP about 3 wks ago.  At that time, weight was back down to his baseline and he was doing well.  No changes were made.  Over the last week, he has gone rapidly down-hill per his wife.  Weight is up 12 lbs. We tried increasing his torsemide at home ot 40 qam/20 qpm, but this has not helped.  He has been short of breath walking around the house and has had orthopnea.  Legs have swollen up to his thighs with blistering. ReDs Clip is markedly high at 57%. He has also had increased confusion and slurred speech for about 3 days.  I do note that his speech is more slurred/not as fluent as normal for him. Wife is concerned he could have had a stroke. Subsequently, he was referred to the ED  for further evaluation and admission by hospitalitis service.   In ED, head CT shows no evidence of acute intracranial abnormality. There is mild-to-moderate cerebral atrophy with comparatively mild cerebellar atrophy.  Labs c/w AKI. SCr up from 2.5>>3.40. BUN 68 K 4.1. BNP 1,325. WBC 5.3. Hgb 10.3   UA pending. Ammonia normal at 25. TSH elevated at 10.24.   Hs trop 28. CXR w/ rt sided pleural effusion and cardiomegaly.    Recent Echo 05/10/20 1. Left ventricular ejection fraction, by estimation, is 25 to 30%. The left ventricle has severely decreased function. The left ventricle demonstrates global hypokinesis with apical akinesis. Left ventricular diastolic parameters are  consistent with Grade II diastolic dysfunction (pseudonormalization). 2. Right ventricular systolic function is mildly reduced. The right ventricular size is normal. There is moderately elevated pulmonary artery systolic pressure. The estimated right ventricular systolic pressure is 99991111 mmHg. 3. Left atrial size was severely dilated. 4. Right atrial size was mildly dilated. 5. The mitral valve is degenerative. Moderate mitral valve regurgitation. No evidence of mitral stenosis. Moderate mitral annular calcification. 6. The aortic valve is tricuspid. Aortic valve regurgitation is not visualized. Mild aortic valve sclerosis is present, with no evidence of aortic valve stenosis. 7. Aortic dilatation noted. There is mild dilatation of the aortic root, measuring 38 mm. 8. The inferior vena cava is dilated in size with <50% respiratory variability, suggesting right atrial pressure of 15 mmHg.  Review of Systems: [y] = yes, '[ ]'$  = no   . General: Weight gain [Y ]; Weight loss '[ ]'$ ; Anorexia '[ ]'$ ; Fatigue [ Y]; Fever '[ ]'$ ; Chills '[ ]'$ ; Weakness '[ ]'$   . Cardiac: Chest pain/pressure '[ ]'$ ; Resting SOB '[ ]'$ ; Exertional SOB [Y ]; Orthopnea '[ ]'$ ; Pedal Edema [Y ]; Palpitations '[ ]'$ ; Syncope '[ ]'$ ; Presyncope '[ ]'$ ; Paroxysmal nocturnal dyspnea'[ ]'$   . Pulmonary: Cough '[ ]'$ ; Wheezing'[ ]'$ ; Hemoptysis'[ ]'$ ; Sputum '[ ]'$ ; Snoring '[ ]'$   . GI: Vomiting'[ ]'$ ; Dysphagia'[ ]'$ ; Melena'[ ]'$ ; Hematochezia '[ ]'$ ; Heartburn'[ ]'$ ; Abdominal pain '[ ]'$ ; Constipation '[ ]'$ ; Diarrhea '[ ]'$ ; BRBPR '[ ]'$   . GU: Hematuria'[ ]'$ ; Dysuria '[ ]'$ ; Nocturia'[ ]'$   . Vascular: Pain in legs with walking '[ ]'$ ; Pain in feet with lying flat '[ ]'$ ; Non-healing sores '[ ]'$ ; Stroke '[ ]'$ ; TIA '[ ]'$ ; Slurred speech '[ ]'$ ;  . Neuro: Headaches'[ ]'$ ; Vertigo'[ ]'$ ; Seizures'[ ]'$ ; Paresthesias'[ ]'$ ;Blurred vision '[ ]'$ ; Diplopia '[ ]'$ ; Vision changes '[ ]'$  Slurred speech [Y] . Ortho/Skin: Arthritis '[ ]'$ ; Joint pain '[ ]'$ ; Muscle pain '[ ]'$ ; Joint swelling '[ ]'$ ; Back Pain '[ ]'$ ; Rash '[ ]'$   . Psych: Depression'[ ]'$ ; Anxiety'[ ]'$    . Heme: Bleeding problems '[ ]'$ ; Clotting disorders '[ ]'$ ; Anemia '[ ]'$   . Endocrine: Diabetes '[ ]'$ ; Thyroid dysfunction'[ ]'$   Home Medications Prior to Admission medications   Medication Sig Start Date End Date Taking? Authorizing Provider  amiodarone (PACERONE) 200 MG tablet Take 0.5 tablets (100 mg total) by mouth daily. 11/26/19   Lyda Jester M, PA-C  apixaban (ELIQUIS) 2.5 MG TABS tablet Take 1 tablet (2.5 mg total) by mouth 2 (two) times daily. 10/12/19   Burnell Blanks, MD  docusate sodium (COLACE) 50 MG capsule Take 100 mg by mouth daily.    [provider]  donepezil (ARICEPT) 5 MG tablet Take 10 mg by mouth at bedtime. 01/07/20   [provider]  empagliflozin (JARDIANCE) 10 MG TABS tablet Take 1 tablet (10 mg total) by mouth  daily before breakfast. 05/10/20   Larey Dresser, MD  insulin degludec (TRESIBA FLEXTOUCH) 200 UNIT/ML FlexTouch Pen Inject 6 Units into the skin daily.    [provider]  omeprazole (PRILOSEC) 20 MG capsule Take 20 mg by mouth 2 (two) times daily before a meal.  11/15/10   [provider]  simvastatin (ZOCOR) 20 MG tablet Take 20 mg by mouth at bedtime.  10/12/10   [provider]  torsemide (DEMADEX) 20 MG tablet Take '40mg'$  (2 tablets) by mouth daily, alternating with '20mg'$  (1 tablet) by mouth daily. Patient taking differently: Take '40mg'$  (2 tablets) by mouth every morning alternating with '20mg'$  (1 tablet) by mouth every evening. 05/10/20   Larey Dresser, MD  Vericiguat 10 MG TABS Take 10 mg by mouth daily. 05/10/20   Larey Dresser, MD    Past Medical History: Past Medical History:  Diagnosis Date  . ANEMIA, HX OF   . Bronchial pneumonia Jan. 2016  . CAD, ARTERY BYPASS GRAFT   . CAROTID ARTERY STENOSIS   . Cataract   . CHF (congestive heart failure) ([place])   . DIABETES MELLITUS, TYPE II   . Diabetic retinal damage of right eye ([place]) 2015  . Diverticulosis   . HYPERCHOLESTEROLEMIA   . HYPERTENSION   .  Myocardial infarction [facility]) June 2007  . New onset atrial fibrillation ([place]) 06/24/2014  . PULMONARY HYPERTENSION   . RENAL INSUFFICIENCY, CHRONIC   . UNSPECIFIED THROMBOCYTOPENIA     Past Surgical History: Past Surgical History:  Procedure Laterality Date  . CARDIAC CATHETERIZATION  06/24/2014  . CARDIOVERSION [name] 05/11/2019   Procedure: CARDIOVERSION;  Surgeon: Larey Dresser, MD;  Location: [facility] ENDOSCOPY;  Service: Cardiovascular;  Laterality: [name];  . CATARACT EXTRACTION  2012   right eye  . COLONOSCOPY    . CORONARY ARTERY BYPASS GRAFT      quad bypass/2007  . EYE SURGERY    . ICD IMPLANT [name] 06/11/2017   Procedure: ICD IMPLANT;  Surgeon: Constance Haw, MD;  Location: [place] CV LAB;  Service: Cardiovascular;  Laterality: [name];  . IR THORACENTESIS ASP PLEURAL SPACE W/IMG GUIDE  10/28/2018  . LEFT HEART CATHETERIZATION WITH CORONARY/GRAFT ANGIOGRAM [name] 06/24/2014   Procedure: LEFT HEART CATHETERIZATION WITH Beatrix Fetters;  Surgeon: Burnell Blanks, MD;  Location: [facility] CATH LAB;  Service: Cardiovascular;  Laterality: [name];  . POLYPECTOMY    . RIGHT HEART CATH [name] 05/22/2019   Procedure: RIGHT HEART CATH;  Surgeon: Larey Dresser, MD;  Location: [place] CV LAB;  Service: Cardiovascular;  Laterality: [name];  . TONSILLECTOMY      Family History: Family History  Problem Relation Age of Onset  . Heart disease Father 64       Heart disease before age 42  . Heart attack Father   . Hypertension Father   . Hyperlipidemia Father   . Varicose Veins Father   . Stroke Father        ? not sure  . Colon cancer Maternal Aunt   . Colon cancer Maternal Uncle   . Diverticulitis Other   . Heart attack Other     Social History: Social History   Socioeconomic History  . Marital status: Married    Spouse name: Not on file  . Number of children: 2  . Years of education: Not on file  . Highest education level: Not on file  Occupational History  . Occupation:  Horticulturist, commercial: RETIRED  Tobacco  Use  . Smoking status: Former Smoker    Types: Cigarettes    Quit date: 03/26/1970    Years since quitting: 50.2  . Smokeless tobacco: Never Used  Vaping Use  . Vaping Use: Never used  Substance and Sexual Activity  . Alcohol use: Yes    Alcohol/week: 1.0 standard drink    Types: 1 Glasses of wine per week  . Drug use: No  . Sexual activity: Not on file  Other Topics Concern  . Not on file  Social History Narrative  . Not on file   Social Determinants of Health   Financial Resource Strain: Not on file  Food Insecurity: Not on file  Transportation Needs: No Transportation Needs  . Lack of Transportation (Medical): No  . Lack of Transportation (Non-Medical): No  Physical Activity: Inactive  . Days of Exercise per Week: 0 days  . Minutes of Exercise per Session: 0 min  Stress: Not on file  Social Connections: Not on file    Allergies:  No Known Allergies  Objective:    Vital Signs:   Pulse Rate:  [46] 46 (04/01 1313) Resp:  [14] 14 (04/01 1313) BP: (105)/(57) 105/57 (04/01 1313) SpO2:  [100 %] 100 % (04/01 1313)    Weight change: There were no vitals filed for this visit.  Intake/Output:  No intake or output data in the 24 hours ending 06/24/20 1341    Physical Exam    General:  Well appearing. No resp difficulty HEENT: normal Neck: supple. JVP 14-16 cm . Carotids 2+ bilat; no bruits. No lymphadenopathy or thyromegaly appreciated. Cor: PMI nondisplaced. Regular rhythm, slow rate. 2/6 HSM LLSB and apex Lungs:  Decreased BS at the bases bilaterally  Abdomen: soft, nontender, nondistended. No hepatosplenomegaly. No bruits or masses. Good bowel sounds. Extremities: no cyanosis, clubbing, rash, 2+ bilateral LE edema to thighs  Neuro: alert & oriented x3 w/ slurred speech, moves all 4 extremities w/o difficulty. Affect pleasant   Telemetry   Sinus brady, 50s   EKG    Sinus bradycardia with 1st degree A-V block  with Premature atrial complexes, 50 bpm   Labs   Basic Metabolic Panel: Recent Labs  Lab 06/24/20 1326  NA 137  K 4.1  CL 96*  GLUCOSE 119*  BUN 68*  CREATININE 3.40*    Liver Function Tests: No results for input(s): AST, ALT, ALKPHOS, BILITOT, PROT, ALBUMIN in the last 168 hours. No results for input(s): LIPASE, AMYLASE in the last 168 hours. No results for input(s): AMMONIA in the last 168 hours.  CBC: Recent Labs  Lab 06/24/20 1326  HGB 10.9*  HCT 32.0*    Cardiac Enzymes: No results for input(s): CKTOTAL, CKMB, CKMBINDEX, TROPONINI in the last 168 hours.  BNP: BNP (last 3 results) Recent Labs    11/26/19 1535 05/10/20 1217  BNP 1,752.4* 1,821.5*    ProBNP (last 3 results) No results for input(s): PROBNP in the last 8760 hours.   CBG: No results for input(s): GLUCAP in the last 168 hours.  Coagulation Studies: No results for input(s): LABPROT, INR in the last 72 hours.   Imaging    No results found.   Medications:     Current Medications: . sodium chloride flush  3 mL Intravenous Once     Infusions:    Assessment/Plan   1. Acute on chronic systolic CHF: Ischemic cardiomyopathy.  Echo in 8/20 with EF 20-25%, moderately decreased RV systolic function.  St Jude ICD.  CHF has been complicated  by CKD stage 4.  RHC in 2/21 showed low but not markedly low cardiac output, he was admitted for diuresis with milrinone.  Milrinone was weaned off. Echo in 2/22 showed EF 25-30% with global hypokinesis with apical akinesis, mildly decreased RV systolic function, PASP 56, moderate MR, IVC dilated.  He has become markedly more symptomatic over the last week.  He is markedly volume overloaded with weight up 12 lbs in a week and REDS clip markedly high at 57%.  - He is going to need admission for diuresis. W/ worsening renal function suspect low output/ cardiorenal syndrome.  - will start empiric milrinone 0.25 mcg/kg/min - Place PICC line to follow co-ox  and CVPs (ok to place PICC, not a candidate for HD) - Start IV Lasix. Will give 40 mg IV bolus followed by lasix gtt at 10 mg/hr - Continue Verquvo 10 mg daily.  - Hold Jardiance for now.  - He will stay off Entresto and spironolactone with elevated creatinine.  - He is off beta blocker with bradycardia and suspected low output  - Narrow QRS, has not been candidate for upgrade to CRT.  - PYP scan in 5/21 not suggestive of transthyretin amyloidosis.   2. CAD: S/p CABG 2007, patent grafts on LHC in 2016. No exertional chest pain.  - Given stable CAD and Eliquis use, he is not on ASA.  - Continue statin.  3. Atrial fibrillation: Paroxysmal.  He remains in NSR today.   - QTc is very long on amiodarone today, will probably need to hold transiently.  - He is on Eliquis 2.5 mg bid.  4. Right chronic pleural effusion: small on CXR today  - plan diuresis per above   5. AKI on CKD stage 4:  Sees nephrology as outpatient. SCr up from prior baseline of 2.5>>3.4 today . Suspect low output/ cardiorenal  - plan inotropic support w/ milrinone + diuresis  - follow BMP 6. Carotid stenosis: Repeat dopplers in 10/22.    7. Moderate OSA: Does not have CPAP yet.  8. Neuro: Slurred speech.  ?CVA versus encephalopathy.   - Head CT negative. Ammonia level ok at 25  - UA pending - further w/u per IM. ? Neuro consult. Has ICD which may prohibit brain MRI     Length of Stay: 0  Lyda Jester, PA-C  06/24/2020, 1:41 PM  Advanced Heart Failure Team Pager [phone] (M-F; 7a - 5p)  Please contact [facility] for night-coverage after hours (4p -7a ) and weekends on [email or web address]  Patient seen with PA, agree with the above note.    I saw him earlier today in clinic and sent him to the ER for admission due to slurred speech, marked fatigue and dyspnea, markedly prolonged QT interval, and marked volume overload with 12 lb weight gain in a week.   General: NAD Neck: JVP 14-16 cm, no thyromegaly or thyroid  nodule.  Lungs: Decreased at bases.  CV: Lateral PMI.  Heart regular S1/S2, no S3/S4, 2/6 HSM LLSB/apex.  2+ edema to thighs.   No carotid bruit.  Difficult to palpate pedal pulses.  Abdomen: Soft, nontender, no hepatosplenomegaly, no distention.  Skin: Intact without lesions or rashes.  Neurologic: Alert and oriented x 3, speech slurred.  Psych: Normal affect. Extremities: No clubbing or cyanosis.  HEENT: Normal.   Head CT not suggestive of acute CVA.   Patient is volume overloaded with cardiorenal syndrome and creatinine higher than baseline (3.37).  He has a history of low output HF  requiring milrinone with prior HF exacerbation, not candidate for advanced therapies with age and renal dysfunction.  - Will place PICC line (not candidate for HD) to follow CVP and co-ox.  - Start milrinone 0.25 empirically along with Lasix infusion as above.  - Cannot get vericiguat in hospital.  - Hold Jardiance with AKI.   He is in NSR, continue apixaban.  Will hold amiodarone for now with markedly long QTc.  Will check Mg level.   [name] 06/24/2020 5:54 PM

## 2020-06-24 NOTE — Plan of Care (Signed)
Chart reviewed: outpatient notes, prior hospital notes, cardiology note from today and cardiology consult note. Labs and imaging reports reviewed which indicate acute on chronic heart failure and CT head negative.  Due to the cardiac complexity of this patient and the need for cardiology management the cardiology service has agreed to admit this patient.

## 2020-06-24 NOTE — H&P (Addendum)
Advanced Heart Failure Team Admission Note   Primary Physician: Shirline Frees, MD PCP-Cardiologist:  [name], MD  [facility]: Dr. Aundra Dubin   Reason for Consultation: acute on chronic systolic heart failure   HPI:    [name]. is seen today for evaluation of acute on chronic systolic heart failure at the request of Dr. Reather Converse, emergency medicine.   85 y.o. with h/o CAD s/p CABG, ischemic cardiomyopathy, and diabetes was referred by Dr. Angelena Form for evaluation of CHF.  Patient had CABG in 2007.  Last cath in 2016 showed all 4 grafts patent.  Echo was 25-30% in 2016.  Most recent echo in 8/20 showed EF 20-25% with moderate RV dysfunction.  He has had a recurrent right pleural effusion with thoracenteses in 5/19 and 8/20.  He thinks that he had been in atrial fibrillation persistently for about a year.  He used to take amiodarone but stopped it because he "felt bad."  However, he does not think that stopping amiodarone made him feel any better.  Creatinine had been elevated, CKD stage 3.   At a prior appt, I thought he was volume overloaded and increased Lasix, but his creatinine steadily rose (2.55 => 3.13 => 3.72). I stopped Entresto and he never started spironolactone. Lasix was cut back to 60 mg daily. He says that during this time, he got his COVID vaccination and after that, he became severely nauseated for a number of days and ate/drank very little.  This eventually resolved.   On 05/11/19, I cardioverted him back to NSR.    Later in 2/21, he had RHC.  This showed elevated right and left heart filling pressures with low but not markedly low cardiac output.  He was admitted for IV diuresis.  While diuresing, he was maintained on milrinone.  This was weaned prior to discharge. PYP scan done in the hospital was equivocal for ATTR amyloidosis. He was discharged on torsemide.   He was seen by Darrick Grinder in 5/21 with significant weight loss.  He was thought to be volume  depleted/dehydrated and creatinine was > 5.  He got IV fluid in the clinic but was still lightheaded and weak so was admitted.  He got IV fluid in the hospital and torsemide was stopped.  Weight started to rise, so he was started back on lower dose of torsemide, 40 qam/20 qpm.  Over time, he dropped dose of torsemide down to 20 mg daily.   Echo in 2/22 showed EF 25-30% with global hypokinesis with apical akinesis, mildly decreased RV systolic function, PASP 56, moderate MR, IVC dilated.   He called for a work-in appointment today.  He was seen by our NP about 3 wks ago.  At that time, weight was back down to his baseline and he was doing well.  No changes were made.  Over the last week, he has gone rapidly down-hill per his wife.  Weight is up 12 lbs. We tried increasing his torsemide at home ot 40 qam/20 qpm, but this has not helped.  He has been short of breath walking around the house and has had orthopnea.  Legs have swollen up to his thighs with blistering. ReDs Clip is markedly high at 57%. He has also had increased confusion and slurred speech for about 3 days.  I do note that his speech is more slurred/not as fluent as normal for him. Wife is concerned he could have had a stroke. Subsequently, he was referred to the ED  for further evaluation and admission by hospitalitis service.   In ED, head CT shows no evidence of acute intracranial abnormality. There is mild-to-moderate cerebral atrophy with comparatively mild cerebellar atrophy.  Labs c/w AKI. SCr up from 2.5>>3.40. BUN 68 K 4.1. BNP 1,325. WBC 5.3. Hgb 10.3   UA pending. Ammonia normal at 25. TSH elevated at 10.24.   Hs trop 28. CXR w/ rt sided pleural effusion and cardiomegaly.    Recent Echo 05/10/20 1. Left ventricular ejection fraction, by estimation, is 25 to 30%. The left ventricle has severely decreased function. The left ventricle demonstrates global hypokinesis with apical akinesis. Left ventricular diastolic parameters are  consistent with Grade II diastolic dysfunction (pseudonormalization). 2. Right ventricular systolic function is mildly reduced. The right ventricular size is normal. There is moderately elevated pulmonary artery systolic pressure. The estimated right ventricular systolic pressure is [id number] mmHg. 3. Left atrial size was severely dilated. 4. Right atrial size was mildly dilated. 5. The mitral valve is degenerative. Moderate mitral valve regurgitation. No evidence of mitral stenosis. Moderate mitral annular calcification. 6. The aortic valve is tricuspid. Aortic valve regurgitation is not visualized. Mild aortic valve sclerosis is present, with no evidence of aortic valve stenosis. 7. Aortic dilatation noted. There is mild dilatation of the aortic root, measuring 38 mm. 8. The inferior vena cava is dilated in size with <50% respiratory variability, suggesting right atrial pressure of 15 mmHg.  Review of Systems: [y] = yes, '[ ]'$  = no   . General: Weight gain [Y ]; Weight loss '[ ]'$ ; Anorexia '[ ]'$ ; Fatigue [ Y]; Fever '[ ]'$ ; Chills '[ ]'$ ; Weakness '[ ]'$   . Cardiac: Chest pain/pressure '[ ]'$ ; Resting SOB '[ ]'$ ; Exertional SOB [Y ]; Orthopnea '[ ]'$ ; Pedal Edema [Y ]; Palpitations '[ ]'$ ; Syncope '[ ]'$ ; Presyncope '[ ]'$ ; Paroxysmal nocturnal dyspnea'[ ]'$   . Pulmonary: Cough '[ ]'$ ; Wheezing'[ ]'$ ; Hemoptysis'[ ]'$ ; Sputum '[ ]'$ ; Snoring '[ ]'$   . GI: Vomiting'[ ]'$ ; Dysphagia'[ ]'$ ; Melena'[ ]'$ ; Hematochezia '[ ]'$ ; Heartburn'[ ]'$ ; Abdominal pain '[ ]'$ ; Constipation '[ ]'$ ; Diarrhea '[ ]'$ ; BRBPR '[ ]'$   . GU: Hematuria'[ ]'$ ; Dysuria '[ ]'$ ; Nocturia'[ ]'$   . Vascular: Pain in legs with walking '[ ]'$ ; Pain in feet with lying flat '[ ]'$ ; Non-healing sores '[ ]'$ ; Stroke '[ ]'$ ; TIA '[ ]'$ ; Slurred speech '[ ]'$ ;  . Neuro: Headaches'[ ]'$ ; Vertigo'[ ]'$ ; Seizures'[ ]'$ ; Paresthesias'[ ]'$ ;Blurred vision '[ ]'$ ; Diplopia '[ ]'$ ; Vision changes '[ ]'$  Slurred speech [Y] . Ortho/Skin: Arthritis '[ ]'$ ; Joint pain '[ ]'$ ; Muscle pain '[ ]'$ ; Joint swelling '[ ]'$ ; Back Pain '[ ]'$ ; Rash '[ ]'$   . Psych: Depression'[ ]'$ ; Anxiety'[ ]'$    . Heme: Bleeding problems '[ ]'$ ; Clotting disorders '[ ]'$ ; Anemia '[ ]'$   . Endocrine: Diabetes '[ ]'$ ; Thyroid dysfunction'[ ]'$   Home Medications Prior to Admission medications   Medication Sig Start Date End Date Taking? Authorizing Provider  amiodarone (PACERONE) 200 MG tablet Take 0.5 tablets (100 mg total) by mouth daily. 11/26/19   Lyda Jester M, PA-C  apixaban (ELIQUIS) 2.5 MG TABS tablet Take 1 tablet (2.5 mg total) by mouth 2 (two) times daily. 10/12/19   Burnell Blanks, MD  docusate sodium (COLACE) 50 MG capsule Take 100 mg by mouth daily.    [provider]  donepezil (ARICEPT) 5 MG tablet Take 10 mg by mouth at bedtime. 01/07/20   [provider]  empagliflozin (JARDIANCE) 10 MG TABS tablet Take 1 tablet (10 mg total) by mouth  daily before breakfast. 05/10/20   Larey Dresser, MD  insulin degludec (TRESIBA FLEXTOUCH) 200 UNIT/ML FlexTouch Pen Inject 6 Units into the skin daily.    [provider]  omeprazole (PRILOSEC) 20 MG capsule Take 20 mg by mouth 2 (two) times daily before a meal.  11/15/10   [provider]  simvastatin (ZOCOR) 20 MG tablet Take 20 mg by mouth at bedtime.  10/12/10   [provider]  torsemide (DEMADEX) 20 MG tablet Take '40mg'$  (2 tablets) by mouth daily, alternating with '20mg'$  (1 tablet) by mouth daily. Patient taking differently: Take '40mg'$  (2 tablets) by mouth every morning alternating with '20mg'$  (1 tablet) by mouth every evening. 05/10/20   Larey Dresser, MD  Vericiguat 10 MG TABS Take 10 mg by mouth daily. 05/10/20   Larey Dresser, MD    Past Medical History: Past Medical History:  Diagnosis Date  . ANEMIA, HX OF   . Bronchial pneumonia Jan. 2016  . CAD, ARTERY BYPASS GRAFT   . CAROTID ARTERY STENOSIS   . Cataract   . CHF (congestive heart failure) ([place])   . DIABETES MELLITUS, TYPE II   . Diabetic retinal damage of right eye ([place]) 2015  . Diverticulosis   . HYPERCHOLESTEROLEMIA   . HYPERTENSION   .  Myocardial infarction [facility]) June 2007  . New onset atrial fibrillation ([place]) 06/24/2014  . PULMONARY HYPERTENSION   . RENAL INSUFFICIENCY, CHRONIC   . UNSPECIFIED THROMBOCYTOPENIA     Past Surgical History: Past Surgical History:  Procedure Laterality Date  . CARDIAC CATHETERIZATION  06/24/2014  . CARDIOVERSION [name] 05/11/2019   Procedure: CARDIOVERSION;  Surgeon: Larey Dresser, MD;  Location: [facility] ENDOSCOPY;  Service: Cardiovascular;  Laterality: [name];  . CATARACT EXTRACTION  2012   right eye  . COLONOSCOPY    . CORONARY ARTERY BYPASS GRAFT      quad bypass/2007  . EYE SURGERY    . ICD IMPLANT [name] 06/11/2017   Procedure: ICD IMPLANT;  Surgeon: Constance Haw, MD;  Location: [place] CV LAB;  Service: Cardiovascular;  Laterality: [name];  . IR THORACENTESIS ASP PLEURAL SPACE W/IMG GUIDE  10/28/2018  . LEFT HEART CATHETERIZATION WITH CORONARY/GRAFT ANGIOGRAM [name] 06/24/2014   Procedure: LEFT HEART CATHETERIZATION WITH Beatrix Fetters;  Surgeon: Burnell Blanks, MD;  Location: [facility] CATH LAB;  Service: Cardiovascular;  Laterality: [name];  . POLYPECTOMY    . RIGHT HEART CATH [name] 05/22/2019   Procedure: RIGHT HEART CATH;  Surgeon: Larey Dresser, MD;  Location: [place] CV LAB;  Service: Cardiovascular;  Laterality: [name];  . TONSILLECTOMY      Family History: Family History  Problem Relation Age of Onset  . Heart disease Father 71       Heart disease before age 25  . Heart attack Father   . Hypertension Father   . Hyperlipidemia Father   . Varicose Veins Father   . Stroke Father        ? not sure  . Colon cancer Maternal Aunt   . Colon cancer Maternal Uncle   . Diverticulitis Other   . Heart attack Other     Social History: Social History   Socioeconomic History  . Marital status: Married    Spouse name: Not on file  . Number of children: 2  . Years of education: Not on file  . Highest education level: Not on file  Occupational History  . Occupation:  Horticulturist, commercial: RETIRED  Tobacco  Use  . Smoking status: Former Smoker    Types: Cigarettes    Quit date: 03/26/1970    Years since quitting: 50.2  . Smokeless tobacco: Never Used  Vaping Use  . Vaping Use: Never used  Substance and Sexual Activity  . Alcohol use: Yes    Alcohol/week: 1.0 standard drink    Types: 1 Glasses of wine per week  . Drug use: No  . Sexual activity: Not on file  Other Topics Concern  . Not on file  Social History Narrative  . Not on file   Social Determinants of Health   Financial Resource Strain: Not on file  Food Insecurity: Not on file  Transportation Needs: No Transportation Needs  . Lack of Transportation (Medical): No  . Lack of Transportation (Non-Medical): No  Physical Activity: Inactive  . Days of Exercise per Week: 0 days  . Minutes of Exercise per Session: 0 min  Stress: Not on file  Social Connections: Not on file    Allergies:  No Known Allergies  Objective:    Vital Signs:   Pulse Rate:  [43-51] 46 (04/01 1830) Resp:  [13-24] 24 (04/01 1830) BP: (97-112)/(52-83) 99/56 (04/01 1830) SpO2:  [95 %-100 %] 95 % (04/01 1830)    Weight change: There were no vitals filed for this visit.  Intake/Output:  No intake or output data in the 24 hours ending 06/24/20 1849    Physical Exam    General:  Well appearing. No resp difficulty HEENT: normal Neck: supple. JVP 14-16 cm . Carotids 2+ bilat; no bruits. No lymphadenopathy or thyromegaly appreciated. Cor: PMI nondisplaced. Regular rhythm, slow rate. 2/6 HSM LLSB and apex Lungs:  Decreased BS at the bases bilaterally  Abdomen: soft, nontender, nondistended. No hepatosplenomegaly. No bruits or masses. Good bowel sounds. Extremities: no cyanosis, clubbing, rash, 2+ bilateral LE edema to thighs  Neuro: alert & oriented x3 w/ slurred speech, moves all 4 extremities w/o difficulty. Affect pleasant   Telemetry   Sinus brady, 50s   EKG    Sinus bradycardia with 1st  degree A-V block with Premature atrial complexes, 50 bpm   Labs   Basic Metabolic Panel: Recent Labs  Lab 06/24/20 1316 06/24/20 1318 06/24/20 1326 06/24/20 1743  NA 138 137 137  --   K 4.1 4.1 4.1  --   CL 95* 95* 96*  --   CO2 32 33*  --   --   GLUCOSE 118* 126* 119*  --   BUN 73* 72* 68*  --   CREATININE 3.37* 3.41* 3.40*  --   CALCIUM 8.9 9.0  --   --   MG  --   --   --  2.6*    Liver Function Tests: Recent Labs  Lab 06/24/20 1316 06/24/20 1318  AST 43* 41  ALT 27 28  ALKPHOS 114 112  BILITOT 1.1 1.0  PROT 6.4* 6.5  ALBUMIN 3.2* 3.3*   No results for input(s): LIPASE, AMYLASE in the last 168 hours. Recent Labs  Lab 06/24/20 1319  AMMONIA 25    CBC: Recent Labs  Lab 06/24/20 1316 06/24/20 1318 06/24/20 1326  WBC 4.9 5.3  --   NEUTROABS 4.0  --   --   HGB 10.5* 10.3* 10.9*  HCT 32.9* 32.3* 32.0*  MCV 91.9 91.8  --   PLT 116* 121*  --     Cardiac Enzymes: No results for input(s): CKTOTAL, CKMB, CKMBINDEX, TROPONINI in the last 168 hours.  BNP: BNP (  last 3 results) Recent Labs    11/26/19 1535 05/10/20 1217 06/24/20 1316  BNP 1,752.4* 1,821.5* 1,353.5*    ProBNP (last 3 results) No results for input(s): PROBNP in the last 8760 hours.   CBG: No results for input(s): GLUCAP in the last 168 hours.  Coagulation Studies: Recent Labs    06/24/20 1316  LABPROT 16.4*  INR 1.4*     Imaging   DG Chest 2 View  Result Date: 06/24/2020 CLINICAL DATA:  Shortness of breath. Additional history provided: Shortness of breath for 1 week, hypertension, diabetes, former smoker. EXAM: CHEST - 2 VIEW COMPARISON:  Prior chest radiographs 10/27/2019 and earlier. Chest CT 11/26/2018. FINDINGS: Redemonstrated single lead left chest implantable cardiac device. Prior median sternotomy/CABG. Unchanged cardiomegaly. Aortic atherosclerosis. Unchanged opacity at the right lung base, reflecting a combination of scarring and pleural effusion. As before, there is  chronic right-sided volume loss. Additional hazy opacity within the right lung. The left lung is clear. No left-sided pleural effusion. No evidence of pneumothorax. No acute bony abnormality identified. IMPRESSION: Unchanged opacity at the right lung base, likely reflecting a combination of scarring and pleural effusion. Associated chronic right-sided volume loss. Additional hazy opacity within the right lung which may reflect the layering component of a right-sided pleural effusion, asymmetric edema, subsegmental atelectasis or possibly infection. Clinical correlation is recommended. Unchanged cardiomegaly. Aortic Atherosclerosis (ICD10-I70.0). Electronically Signed   By: Kellie Simmering DO   On: 06/24/2020 14:37   CT HEAD WO CONTRAST  Result Date: 06/24/2020 CLINICAL DATA:  Neuro deficit, acute, stroke suspected; stroke like symptoms beginning 1 week ago. Slurred speech and confusion. EXAM: CT HEAD WITHOUT CONTRAST TECHNIQUE: Contiguous axial images were obtained from the base of the skull through the vertex without intravenous contrast. COMPARISON:  No pertinent prior exams available for comparison. FINDINGS: Brain: Mild-to-moderate cerebral atrophy. Commensurate prominence of the ventricles and sulci. Comparatively mild cerebellar atrophy. There is no acute intracranial hemorrhage. No demarcated cortical infarct. No extra-axial fluid collection. No evidence of intracranial mass. No midline shift. Vascular: No hyperdense vessel.  Atherosclerotic calcifications. Skull: Normal. Negative for fracture or focal lesion. Sinuses/Orbits: Visualized orbits show no acute finding. Trace bilateral ethmoid sinus mucosal thickening. Small right maxillary sinus mucous retention cyst. IMPRESSION: No evidence of acute intracranial abnormality. Mild-to-moderate cerebral atrophy with comparatively mild cerebellar atrophy. Electronically Signed   By: Kellie Simmering DO   On: 06/24/2020 14:32   [place] EKG SITE RITE  Result Date:  06/24/2020 If Site Rite image not attached, placement could not be confirmed due to current cardiac rhythm.    Medications:     Current Medications: . apixaban  2.5 mg Oral BID  . simvastatin  20 mg Oral q1800  . sodium chloride flush  3 mL Intravenous Once    Infusions: . furosemide (LASIX) 200 mg in dextrose 5% 100 mL ('2mg'$ /mL) infusion 10 mg/hr (06/24/20 1819)  . milrinone 0.25 mcg/kg/min (06/24/20 1812)     Assessment/Plan   1. Acute on chronic systolic CHF: Ischemic cardiomyopathy.  Echo in 8/20 with EF 20-25%, moderately decreased RV systolic function.  St Jude ICD.  CHF has been complicated by CKD stage 4.  RHC in 2/21 showed low but not markedly low cardiac output, he was admitted for diuresis with milrinone.  Milrinone was weaned off. Echo in 2/22 showed EF 25-30% with global hypokinesis with apical akinesis, mildly decreased RV systolic function, PASP 56, moderate MR, IVC dilated.  He has become markedly more symptomatic over the last week.  He is markedly volume overloaded with weight up 12 lbs in a week and REDS clip markedly high at 57%.  - He is going to need admission for diuresis. W/ worsening renal function suspect low output/ cardiorenal syndrome.  - will start empiric milrinone 0.25 mcg/kg/min - Place PICC line to follow co-ox and CVPs (ok to place PICC, not a candidate for HD) - Start IV Lasix. Will give 40 mg IV bolus followed by lasix gtt at 10 mg/hr - Continue Verquvo 10 mg daily.  - Hold Jardiance for now.  - He will stay off Entresto and spironolactone with elevated creatinine.  - He is off beta blocker with bradycardia and suspected low output  - Narrow QRS, has not been candidate for upgrade to CRT.  - PYP scan in 5/21 not suggestive of transthyretin amyloidosis.   2. CAD: S/p CABG 2007, patent grafts on LHC in 2016. No exertional chest pain.  - Given stable CAD and Eliquis use, he is not on ASA.  - Continue statin.  3. Atrial fibrillation: Paroxysmal.  He  remains in NSR today.   - QTc is very long on amiodarone today, will probably need to hold transiently.  - He is on Eliquis 2.5 mg bid.  4. Right chronic pleural effusion: small on CXR today  - plan diuresis per above   5. AKI on CKD stage 4:  Sees nephrology as outpatient. SCr up from prior baseline of 2.5>>3.4 today . Suspect low output/ cardiorenal  - plan inotropic support w/ milrinone + diuresis  - follow BMP 6. Carotid stenosis: Repeat dopplers in 10/22.    7. Moderate OSA: Does not have CPAP yet.  8. Neuro: Slurred speech.  ?CVA versus encephalopathy.   - Head CT negative. Ammonia level ok at 25  - UA pending - further w/u per IM. ? Neuro consult. Has ICD which may prohibit brain MRI     Length of Stay: 0  [name], MD  06/24/2020, 6:49 PM  Advanced Heart Failure Team Pager [phone] (M-F; 7a - 5p)  Please contact [facility] for night-coverage after hours (4p -7a ) and weekends on [email or web address]  Patient seen with PA, agree with the above note.    I saw him earlier today in clinic and sent him to the ER for admission due to slurred speech, marked fatigue and dyspnea, markedly prolonged QT interval, and marked volume overload with 12 lb weight gain in a week.   General: NAD Neck: JVP 14-16 cm, no thyromegaly or thyroid nodule.  Lungs: Decreased at bases.  CV: Lateral PMI.  Heart regular S1/S2, no S3/S4, 2/6 HSM LLSB/apex.  2+ edema to thighs.   No carotid bruit.  Difficult to palpate pedal pulses.  Abdomen: Soft, nontender, no hepatosplenomegaly, no distention.  Skin: Intact without lesions or rashes.  Neurologic: Alert and oriented x 3, speech slurred.  Psych: Normal affect. Extremities: No clubbing or cyanosis.  HEENT: Normal.   Head CT not suggestive of acute CVA.   Patient is volume overloaded with cardiorenal syndrome and creatinine higher than baseline (3.37).  He has a history of low output HF requiring milrinone with prior HF exacerbation, not candidate for  advanced therapies with age and renal dysfunction.  - Will place PICC line (not candidate for HD) to follow CVP and co-ox.  - Start milrinone 0.25 empirically along with Lasix infusion as above.  - Cannot get vericiguat in hospital.  - Hold Jardiance with AKI.   He is in NSR, continue  apixaban.  Will hold amiodarone for now with markedly long QTc.  Will check Mg level.   [name] 06/24/2020 6:49 PM

## 2020-06-24 NOTE — Progress Notes (Signed)
ReDS Vest / Clip - 06/24/20 1200      ReDS Vest / Clip   Station Marker C    Ruler Value 30    ReDS Value Range High volume overload    ReDS Actual Value 57    Anatomical Comments sitting

## 2020-06-24 NOTE — ED Notes (Signed)
IV team paged regarding PICC line placement. Waiting for response. Per pharmacist, Milrinone should be run on PICC and not peripheral.

## 2020-06-24 NOTE — ED Notes (Signed)
IV team called and states they are at [facility] for now and will get here eventually for PICC placement for CVP monitoring and Milrinone therapy. Confirmed with pharmacist, Marcello Moores, that it is okay to run Milrinone peripherally for the mean time until PICC line is placed. 2 peripheral IV in place for now. Pt remains alert and oriented x 4. Wife states pt could be confused at night due to hx of dementia. Wife just left the unit. Will continue to monitor.

## 2020-06-24 NOTE — ED Provider Notes (Signed)
[place] EMERGENCY DEPARTMENT Provider Note   CSN: [id number] Arrival date & time: 06/24/20  1306     History Chief Complaint  Patient presents with  . stroke like s/s    [name]. is a 85 y.o. male.  Patient presents for worsening general weakness and confusion for 1 week.  Mild slurred speech at times.  Patient's had increased swelling and weight gain.  Patient has history of renal disease.  Patient denies urinary symptoms, chest pain or new shortness of breath.  No new medications.  Patient is on torsemide and no changes or missed doses.  Patient was sent in by cardiologist for admission and further work-up and treatment.  Patient has history of heart failure and atrial fibrillation.        Past Medical History:  Diagnosis Date  . ANEMIA, HX OF   . Bronchial pneumonia Jan. 2016  . CAD, ARTERY BYPASS GRAFT   . CAROTID ARTERY STENOSIS   . Cataract   . CHF (congestive heart failure) ([place])   . DIABETES MELLITUS, TYPE II   . Diabetic retinal damage of right eye ([place]) 2015  . Diverticulosis   . HYPERCHOLESTEROLEMIA   . HYPERTENSION   . Myocardial infarction [facility]) June 2007  . New onset atrial fibrillation ([place]) 06/24/2014  . PULMONARY HYPERTENSION   . RENAL INSUFFICIENCY, CHRONIC   . UNSPECIFIED THROMBOCYTOPENIA     Patient Active Problem List   Diagnosis Date Noted  . Acute renal failure (ARF) ([place]) 08/14/2019  . ARF (acute renal failure) ([place]) 08/14/2019  . CHF (congestive heart failure) ([place]) 05/22/2019  . Pleural effusion 10/13/2018  . Dyspnea 06/02/2018  . Ischemic cardiomyopathy 06/11/2017  . Cardiomyopathy- new drop in EF- ? secondary to new AF 06/25/2014  . Coronary artery disease involving coronary bypass graft of native heart with unstable angina pectoris ([place])   . Atrial fibrillation with rapid ventricular response ([place]) 06/24/2014  . UNSPECIFIED THROMBOCYTOPENIA 07/26/2008  . Pulmonary hypertension-PA 51 mmHg  07/26/2008  .  Type 2 diabetes mellitus with renal manifestations, controlled ([place]) 07/22/2008  . Dyslipidemia 07/22/2008  . Essential hypertension 07/22/2008  . 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 PVD - moderate bilateral carotid disease 07/22/2008  . Chronic renal insufficiency, stage III (moderate) ([place]) 07/22/2008  . ANEMIA, HX OF 07/22/2008    Past Surgical History:  Procedure Laterality Date  . CARDIAC CATHETERIZATION  06/24/2014  . CARDIOVERSION [name] 05/11/2019   Procedure: CARDIOVERSION;  Surgeon: Larey Dresser, MD;  Location: [facility] ENDOSCOPY;  Service: Cardiovascular;  Laterality: [name];  . CATARACT EXTRACTION  2012   right eye  . COLONOSCOPY    . CORONARY ARTERY BYPASS GRAFT      quad bypass/2007  . EYE SURGERY    . ICD IMPLANT [name] 06/11/2017   Procedure: ICD IMPLANT;  Surgeon: Constance Haw, MD;  Location: [place] CV LAB;  Service: Cardiovascular;  Laterality: [name];  . IR THORACENTESIS ASP PLEURAL SPACE W/IMG GUIDE  10/28/2018  . LEFT HEART CATHETERIZATION WITH CORONARY/GRAFT ANGIOGRAM [name] 06/24/2014   Procedure: LEFT HEART CATHETERIZATION WITH Beatrix Fetters;  Surgeon: Burnell Blanks, MD;  Location: [facility] CATH LAB;  Service: Cardiovascular;  Laterality: [name];  . POLYPECTOMY    . RIGHT HEART CATH [name] 05/22/2019   Procedure: RIGHT HEART CATH;  Surgeon: Larey Dresser, MD;  Location: [place] CV LAB;  Service: Cardiovascular;  Laterality: [name];  . TONSILLECTOMY  Family History  Problem Relation Age of Onset  . Heart disease Father 1       Heart disease before age 58  . Heart attack Father   . Hypertension Father   . Hyperlipidemia Father   . Varicose Veins Father   . Stroke Father        ? not sure  . Colon cancer Maternal Aunt   . Colon cancer Maternal Uncle   . Diverticulitis Other   . Heart attack Other     Social History   Tobacco Use  . Smoking status: Former Smoker    Types: Cigarettes    Quit date: 03/26/1970     Years since quitting: 50.2  . Smokeless tobacco: Never Used  Vaping Use  . Vaping Use: Never used  Substance Use Topics  . Alcohol use: Yes    Alcohol/week: 1.0 standard drink    Types: 1 Glasses of wine per week  . Drug use: No    Home Medications Prior to Admission medications   Medication Sig Start Date End Date Taking? Authorizing Provider  amiodarone (PACERONE) 200 MG tablet Take 0.5 tablets (100 mg total) by mouth daily. 11/26/19   Lyda Jester M, PA-C  apixaban (ELIQUIS) 2.5 MG TABS tablet Take 1 tablet (2.5 mg total) by mouth 2 (two) times daily. 10/12/19   Burnell Blanks, MD  docusate sodium (COLACE) 50 MG capsule Take 100 mg by mouth daily.    [provider]  donepezil (ARICEPT) 10 MG tablet Take 10 mg by mouth at bedtime. 04/26/20   [provider]  donepezil (ARICEPT) 5 MG tablet Take 10 mg by mouth at bedtime. 01/07/20   [provider]  ELIQUIS 5 MG TABS tablet Take 2.5 mg by mouth 2 (two) times daily. 06/21/20   [provider]  empagliflozin (JARDIANCE) 10 MG TABS tablet Take 1 tablet (10 mg total) by mouth daily before breakfast. 05/10/20   Larey Dresser, MD  insulin degludec (TRESIBA FLEXTOUCH) 200 UNIT/ML FlexTouch Pen Inject 6 Units into the skin daily.    [provider]  omeprazole (PRILOSEC) 20 MG capsule Take 20 mg by mouth 2 (two) times daily before a meal.  11/15/10   [provider]  simvastatin (ZOCOR) 20 MG tablet Take 20 mg by mouth at bedtime.  10/12/10   [provider]  torsemide (DEMADEX) 20 MG tablet Take '40mg'$  (2 tablets) by mouth daily, alternating with '20mg'$  (1 tablet) by mouth daily. Patient taking differently: Take '40mg'$  (2 tablets) by mouth every morning alternating with '20mg'$  (1 tablet) by mouth every evening. 05/10/20   Larey Dresser, MD  Vericiguat 10 MG TABS Take 10 mg by mouth daily. 05/10/20   Larey Dresser, MD    Allergies    Patient has no known allergies.  Review  of Systems   Review of Systems  Constitutional: Negative for chills and fever.  HENT: Negative for congestion.   Eyes: Negative for visual disturbance.  Respiratory: Negative for shortness of breath.   Cardiovascular: Positive for leg swelling. Negative for chest pain.  Gastrointestinal: Negative for abdominal pain and vomiting.  Genitourinary: Negative for dysuria and flank pain.  Musculoskeletal: Negative for back pain, neck pain and neck stiffness.  Skin: Negative for rash.  Neurological: Positive for weakness. Negative for light-headedness and headaches.    Physical Exam Updated Vital Signs BP (!) 112/52 (BP Location: Left Arm)   Pulse (!) 48   Resp 15   SpO2 97%  Physical Exam Vitals and nursing note reviewed.  Constitutional:      Appearance: He is well-developed.  HENT:     Head: Normocephalic and atraumatic.  Eyes:     General:        Right eye: No discharge.        Left eye: No discharge.     Conjunctiva/sclera: Conjunctivae normal.  Neck:     Trachea: No tracheal deviation.  Cardiovascular:     Rate and Rhythm: Regular rhythm. Bradycardia present.  Pulmonary:     Effort: Pulmonary effort is normal.     Breath sounds: Normal breath sounds.  Abdominal:     General: There is no distension.     Palpations: Abdomen is soft.     Tenderness: There is no abdominal tenderness. There is no guarding.  Musculoskeletal:        General: Swelling present.     Cervical back: Normal range of motion and neck supple.     Comments: Patient has moderate leg edema pitting bilateral lower extremities.  Skin:    General: Skin is warm.     Findings: Rash present.  Neurological:     Mental Status: He is alert and oriented to person, place, and time.     Comments: QuestionPatient answers yes, general weakness on exam, finger-nose intact, extraocular muscle function intact.       ED Results / Procedures / Treatments   Labs (all labs ordered are listed, but only abnormal  results are displayed) Labs Reviewed  CBC WITH DIFFERENTIAL/PLATELET - Abnormal; Notable for the following components:      Result Value   RBC 3.58 (*)    Hemoglobin 10.5 (*)    HCT 32.9 (*)    RDW 16.3 (*)    Platelets 116 (*)    Lymphs Abs 0.4 (*)    All other components within normal limits  COMPREHENSIVE METABOLIC PANEL - Abnormal; Notable for the following components:   Chloride 95 (*)    Glucose, Bld 118 (*)    BUN 73 (*)    Creatinine, Ser 3.37 (*)    Total Protein 6.4 (*)    Albumin 3.2 (*)    AST 43 (*)    GFR, Estimated 17 (*)    All other components within normal limits  PROTIME-INR - Abnormal; Notable for the following components:   Prothrombin Time 16.4 (*)    INR 1.4 (*)    All other components within normal limits  APTT - Abnormal; Notable for the following components:   aPTT 47 (*)    All other components within normal limits  I-STAT CHEM 8, ED - Abnormal; Notable for the following components:   Chloride 96 (*)    BUN 68 (*)    Creatinine, Ser 3.40 (*)    Glucose, Bld 119 (*)    Calcium, Ion 1.03 (*)    Hemoglobin 10.9 (*)    HCT 32.0 (*)    All other components within normal limits  BRAIN NATRIURETIC PEPTIDE  TSH  CBG MONITORING, ED  TROPONIN I (HIGH SENSITIVITY)  TROPONIN I (HIGH SENSITIVITY)    EKG None  Radiology DG Chest 2 View  Result Date: 06/24/2020 CLINICAL DATA:  Shortness of breath. Additional history provided: Shortness of breath for 1 week, hypertension, diabetes, former smoker. EXAM: CHEST - 2 VIEW COMPARISON:  Prior chest radiographs 10/27/2019 and earlier. Chest CT 11/26/2018. FINDINGS: Redemonstrated single lead left chest implantable cardiac device. Prior median sternotomy/CABG. Unchanged cardiomegaly. Aortic atherosclerosis. Unchanged opacity  at the right lung base, reflecting a combination of scarring and pleural effusion. As before, there is chronic right-sided volume loss. Additional hazy opacity within the right lung. The left  lung is clear. No left-sided pleural effusion. No evidence of pneumothorax. No acute bony abnormality identified. IMPRESSION: Unchanged opacity at the right lung base, likely reflecting a combination of scarring and pleural effusion. Associated chronic right-sided volume loss. Additional hazy opacity within the right lung which may reflect the layering component of a right-sided pleural effusion, asymmetric edema, subsegmental atelectasis or possibly infection. Clinical correlation is recommended. Unchanged cardiomegaly. Aortic Atherosclerosis (ICD10-I70.0). Electronically Signed   By: Kellie Simmering DO   On: 06/24/2020 14:37   CT HEAD WO CONTRAST  Result Date: 06/24/2020 CLINICAL DATA:  Neuro deficit, acute, stroke suspected; stroke like symptoms beginning 1 week ago. Slurred speech and confusion. EXAM: CT HEAD WITHOUT CONTRAST TECHNIQUE: Contiguous axial images were obtained from the base of the skull through the vertex without intravenous contrast. COMPARISON:  No pertinent prior exams available for comparison. FINDINGS: Brain: Mild-to-moderate cerebral atrophy. Commensurate prominence of the ventricles and sulci. Comparatively mild cerebellar atrophy. There is no acute intracranial hemorrhage. No demarcated cortical infarct. No extra-axial fluid collection. No evidence of intracranial mass. No midline shift. Vascular: No hyperdense vessel.  Atherosclerotic calcifications. Skull: Normal. Negative for fracture or focal lesion. Sinuses/Orbits: Visualized orbits show no acute finding. Trace bilateral ethmoid sinus mucosal thickening. Small right maxillary sinus mucous retention cyst. IMPRESSION: No evidence of acute intracranial abnormality. Mild-to-moderate cerebral atrophy with comparatively mild cerebellar atrophy. Electronically Signed   By: Kellie Simmering DO   On: 06/24/2020 14:32    Procedures Procedures   Medications Ordered in ED Medications  sodium chloride flush (NS) 0.9 % injection 3 mL (3 mLs  Intravenous Not Given 06/24/20 1427)    ED Course  I have reviewed the triage vital signs and the nursing notes.  Pertinent labs & imaging results that were available during my care of the patient were reviewed by me and considered in my medical decision making (see chart for details).    MDM Rules/Calculators/A&P                          Patient presents emergency room after being recommended by cardiology for further evaluation and admission.  Clinical concern for fluid overload, renal failure, electrolyte abnormalities, unlikely stroke with general symptoms however CT scan of the head with patient on blood thinners to ensure no occult bleed, rhythm/symptomatic bradycardia.  Patient is not on a beta-blocker.  Urine infection, other. Patient's blood work ordered and reviewed showing hemoglobin 10.4, creatinine 3.4 previously in the twos reviewed medical records. Chest x-ray reviewed showing small pleural effusion on the right. EKG reviewed showing bradycardia overall similar morphology to previous.  CT head no acute bleeding. Page hospitalist for admission.    Final Clinical Impression(s) / ED Diagnoses Final diagnoses:  Symptomatic bradycardia  Pleural effusion, right  Acute renal failure, unspecified acute renal failure type [facility])  General weakness    Rx / DC Orders ED Discharge Orders    None       Elnora Morrison, MD 06/24/20 1504

## 2020-06-24 NOTE — Progress Notes (Signed)
PCP: Shirline Frees, MD Cardiology: Dr. Angelena Form HF Cardiology: Dr. Aundra Dubin  85 y.o. with h/o CAD s/p CABG, ischemic cardiomyopathy, and diabetes was referred by Dr. Angelena Form for evaluation of CHF.  Patient had CABG in 2007.  Last cath in 2016 showed all 4 grafts patent.  Echo was 25-30% in 2016.  Most recent echo in 8/20 showed EF 20-25% with moderate RV dysfunction.  He has had a recurrent right pleural effusion with thoracenteses in 5/19 and 8/20.  He thinks that he has been in atrial fibrillation persistently for about a year.  He used to take amiodarone but stopped it because he "felt bad."  However, he does not think that stopping amiodarone made him feel any better.  Creatinine has been elevated, CKD stage 3.   At a prior appt, I thought he was volume overloaded and increased Lasix, but his creatinine steadily rose (2.55 => 3.13 => 3.72). I stopped Entresto and he never started spironolactone.  Lasix was cut back to 60 mg daily. He says that during this time, he got his COVID vaccination and after that, he became severely nauseated for a number of days and ate/drank very little.  This eventually resolved.   On 05/11/19, I cardioverted him back to NSR.    Later in 2/21, he had RHC.  This showed elevated right and left heart filling pressures with low but not markedly low cardiac output.  He was admitted for IV diuresis.  While diuresing, he was maintained on milrinone.  This was weaned prior to discharge. PYP scan done in the hospital was equivocal for ATTR amyloidosis. He was discharged on torsemide.   He was seen by Darrick Grinder in 5/21 with significant weight loss.  He was thought to be volume depleted/dehydrated and creatinine was > 5.  He got IV fluid in the clinic but was still lightheaded and weak so was admitted.  He got IV fluid in the hospital and torsemide was stopped.  Weight started to rise, so he was started back on lower dose of torsemide, 40 qam/20 qpm.  Over time, he dropped dose of  torsemide down to 20 mg daily.   Echo in 2/22 showed EF 25-30% with global hypokinesis with apical akinesis, mildly decreased RV systolic function, PASP 56, moderate MR, IVC dilated.   He called for a work-in appointment today.  He was seen by our NP about 3 wks ago.  At that time, weight was back down to his baseline and he was doing well.  No changes were made.  Over the last week, he has gone rapidly down-hill per his wife.  Weight is up 12 lbs. We tried increasing his torsemide at home ot 40 qam/20 qpm, but this has not helped.  He has been short of breath walking around the house and has had orthopnea.  Legs have swollen up to his thighs with blistering.  He has also had increased confusion and slurred speech for about 3 days.  I do note that his speech is more slurred/not as fluent as normal for him. Wife is concerned he could have had a stroke.   Labs (12/20): K 4.5, creatinine 2.01 => 1.84  Labs (2/21): K 3.9, creatinine 2.55 => 3.13 => 3.72 Labs (3/21): hgb 11.1, K 3.6 => 4.6, creatinine 3.04 => 3.64, myeloma panel negative, urine immunofixation negative. TSH and LFTs normal.  Labs (5/21): K 4, creatinine 5.1 => 3.48 Labs (9/21): K 3.7, creatinine 2.98, BNP 1752 Labs (12/21): K 3.5, creatinine 2.69 (GFR  23), TSH normal, hgb 11.3 Labs (3/22): K 3.8, creatinine 2.46  ECG (personally reviewed): NSR, 1st degree AVB, low voltage, QTc markedly prolonged at 614 msec  REDS clip 57%  PMH: 1. Type 2 diabetes  2. HTN 3. Hyperlipidemia 4. CAD: CABG x 4 in 2007 with LIMA-LAD, SVG-OM, sequential SVG-PLV/PDA.   - LHC (2016): Patent grafts.  5. Atrial fibrillation: Persistent.  - DCCV to NSR 2/21.  6. CKD stage 4.  7. Carotid stenosis:  - Carotid dopplers (8/20): 60-79% RICA stenosis.  - Carotid dopplers (10/21): 60-79% RICA stenosis.  8. Chronic systolic CHF: Ischemic cardiomyopathy.    - Echo (2016): EF 25-30% - Echo (2018): EF 25% - Echo (8/20): EF 20-25%, moderately decreased RV  systolic function, severe LAE.  - St Jude ICD.   - RHC (3/21): mean RA 18, PA 71/27, mean PCWP 24, CI 2.16 (thermo), CI 2.2 (Fick), PVR 4.6 WU, PAPi 2.4.  - PYP scan (3/21): grade 1 but H/CL ratio 1.6 => equivocal for TTR amyloidosis.   - PYP scan (5/21): grade 1, H/CL ratio 1.2 => not suggestive of TTR amyloidosis.  - Echo (2/22): EF 25-30% with global hypokinesis with apical akinesis, mildly decreased RV systolic function, PASP 56, moderate MR, IVC dilated.  9. Pleural effusion on right: s/p thoracentesis in 5/19 and 8/20.  10. Dementia: Mild.  11. Moderate OSA  Social History   Socioeconomic History  . Marital status: Married    Spouse name: Not on file  . Number of children: 2  . Years of education: Not on file  . Highest education level: Not on file  Occupational History  . Occupation: Horticulturist, commercial: RETIRED  Tobacco Use  . Smoking status: Former Smoker    Types: Cigarettes    Quit date: 03/26/1970    Years since quitting: 50.2  . Smokeless tobacco: Never Used  Vaping Use  . Vaping Use: Never used  Substance and Sexual Activity  . Alcohol use: Yes    Alcohol/week: 1.0 standard drink    Types: 1 Glasses of wine per week  . Drug use: No  . Sexual activity: Not on file  Other Topics Concern  . Not on file  Social History Narrative  . Not on file   Social Determinants of Health   Financial Resource Strain: Not on file  Food Insecurity: Not on file  Transportation Needs: No Transportation Needs  . Lack of Transportation (Medical): No  . Lack of Transportation (Non-Medical): No  Physical Activity: Inactive  . Days of Exercise per Week: 0 days  . Minutes of Exercise per Session: 0 min  Stress: Not on file  Social Connections: Not on file  Intimate Partner Violence: Not on file   Family History  Problem Relation Age of Onset  . Heart disease Father 38       Heart disease before age 47  . Heart attack Father   . Hypertension Father   . Hyperlipidemia  Father   . Varicose Veins Father   . Stroke Father        ? not sure  . Colon cancer Maternal Aunt   . Colon cancer Maternal Uncle   . Diverticulitis Other   . Heart attack Other    ROS: All systems reviewed and negative except as per HPI.   Current Outpatient Medications  Medication Sig Dispense Refill  . amiodarone (PACERONE) 200 MG tablet Take 0.5 tablets (100 mg total) by mouth daily. 90 tablet 3  .  apixaban (ELIQUIS) 2.5 MG TABS tablet Take 1 tablet (2.5 mg total) by mouth 2 (two) times daily. 180 tablet 1  . docusate sodium (COLACE) 50 MG capsule Take 100 mg by mouth daily.    [name] donepezil (ARICEPT) 5 MG tablet Take 10 mg by mouth at bedtime.    . empagliflozin (JARDIANCE) 10 MG TABS tablet Take 1 tablet (10 mg total) by mouth daily before breakfast. 90 tablet 3  . insulin degludec (TRESIBA FLEXTOUCH) 200 UNIT/ML FlexTouch Pen Inject 6 Units into the skin daily.    [name] omeprazole (PRILOSEC) 20 MG capsule Take 20 mg by mouth 2 (two) times daily before a meal.     . simvastatin (ZOCOR) 20 MG tablet Take 20 mg by mouth at bedtime.     . torsemide (DEMADEX) 20 MG tablet Take '40mg'$  (2 tablets) by mouth daily, alternating with '20mg'$  (1 tablet) by mouth daily. (Patient taking differently: Take '40mg'$  (2 tablets) by mouth every morning alternating with '20mg'$  (1 tablet) by mouth every evening.) 90 tablet 3  . Vericiguat 10 MG TABS Take 10 mg by mouth daily. 30 tablet 11   No current facility-administered medications for this encounter.   BP 102/64   Pulse (!) 53   Wt 73.4 kg (161 lb 12.8 oz)   SpO2 97%   BMI 22.57 kg/m   Wt Readings from Last 3 Encounters:  06/24/20 73.4 kg (161 lb 12.8 oz)  06/01/20 67.8 kg (149 lb 6.4 oz)  05/10/20 69.1 kg (152 lb 6.4 oz)   General: NAD Neck: JVP 14-16 cm, no thyromegaly or thyroid nodule.  Lungs: Decreased at bases.  CV: Lateral PMI.  Heart regular S1/S2, no S3/S4, 2/6 HSM LLSB/apex.  2+ edema to thighs.   No carotid bruit.  Difficult to palpate pedal  pulses.  Abdomen: Soft, nontender, no hepatosplenomegaly, no distention.  Skin: Intact without lesions or rashes.  Neurologic: Alert and oriented x 3, speech slurred.  Psych: Normal affect. Extremities: No clubbing or cyanosis.  HEENT: Normal.    Assessment/Plan: 1. Acute on chronic systolic CHF: Ischemic cardiomyopathy.  Echo in 8/20 with EF 20-25%, moderately decreased RV systolic function.  St Jude ICD.  CHF has been complicated by CKD stage 4.  RHC in 2/21 showed low but not markedly low cardiac output, he was admitted for diuresis with milrinone.  Milrinone was weaned off. Echo in 2/22 showed EF 25-30% with global hypokinesis with apical akinesis, mildly decreased RV systolic function, PASP 56, moderate MR, IVC dilated.  He has become markedly more symptomatic over the last week.  He is markedly volume overloaded with weight up 12 lbs in a week and REDS clip markedly high at 57%.  - He is going to need admission for diuresis.  However, with markedly prolonged QTc today, I am concerned for electrolyte abnormalities and worsening renal function that may need to be emergently addressed.  Also, ?CVA.  Therefore, will send through ER.  Will likely need hospitalist admission with CHF consulting.  - Continue Verquvo 10 mg daily.  - Hold Jardiance until we see renal function.  - He will stay off Entresto and spironolactone with elevated creatinine.  - He is off beta blocker with bradycardia.   - Narrow QRS, has not been candidate for upgrade to CRT.  - PYP scan in 5/21 not suggestive of transthyretin amyloidosis.   2. CAD: S/p CABG 2007, patent grafts on LHC in 2016. No exertional chest pain.  - Given stable CAD and Eliquis use, he  is not on ASA.  - Continue statin.  3. Atrial fibrillation: Paroxysmal.  He remains in NSR today.   - QTc is very long on amiodarone today, will probably need to hold transiently.  - He is on Eliquis 2.5 mg bid.  4. Right chronic pleural effusion: Only small effusion  on last CXR.  5. CKD stage 4:  Sees nephrology.  BMET today, concerned for AKI on CKD and electrolyte abnormalities.  6. Carotid stenosis: Repeat dopplers in 10/22.    7. Moderate OSA: Does not have CPAP yet.  8. Neuro: Slurred speech.  ?CVA versus encephalopathy.   - ER evaluation, will need head CT.   We are sending him to ER.    [name]  06/24/2020

## 2020-06-24 NOTE — ED Provider Notes (Signed)
MSE was initiated and I personally evaluated the patient and placed orders (if any) at  1:16 PM on June 24, 2020.  The patient appears stable so that the remainder of the MSE may be completed by another provider.  Presents today after being directed to ED by his cardiologist.  Concern for increased fluid retention/weight gain and slurred speech/stroke.  Symptoms started three days ago.    He does not meet code stroke criteria due to duration of symptoms.    CT head and stroke type labs ordered along with bnp and CHF type labs.    Unable to obtain temp in triage (cold) so TSH added.   Patient counseled on the need to stay for full evaluation.    Lorin Glass, PA-C 06/24/20 1321    Elnora Morrison, MD 06/25/20 1019

## 2020-06-24 NOTE — Progress Notes (Signed)
Remote ICD transmission.   

## 2020-06-24 NOTE — ED Triage Notes (Signed)
Pt is here today due to stroke-like s/s that started x1 week ago. Pt states he was told he started having slurred speech & confused. Pt reports he thinks he is having increase weight gain.

## 2020-06-24 NOTE — Progress Notes (Signed)
Peripherally Inserted Central Catheter Placement  The IV Nurse has discussed with the patient and/or persons authorized to consent for the patient, the purpose of this procedure and the potential benefits and risks involved with this procedure.  The benefits include less needle sticks, lab draws from the catheter, and the patient may be discharged home with the catheter. Risks include, but not limited to, infection, bleeding, blood clot (thrombus formation), and puncture of an artery; nerve damage and irregular heartbeat and possibility to perform a PICC exchange if needed/ordered by physician.  Alternatives to this procedure were also discussed.  Bard Power PICC patient education guide, fact sheet on infection prevention and patient information card has been provided to patient /or left at bedside.    PICC Placement Documentation  PICC Double Lumen [id number] PICC Right Basilic 44 cm 2 cm (Active)  Indication for Insertion or Continuance of Line Vasoactive infusions;Chronic illness with exacerbations (CF, Sickle Cell, etc.) 06/24/20 2224  Exposed Catheter (cm) 2 cm 06/24/20 2224  Site Assessment Clean;Dry;Intact 06/24/20 2224  Lumen #1 Status Flushed;Saline locked;Blood return noted 06/24/20 2224  Lumen #2 Status Flushed;Saline locked;Blood return noted 06/24/20 2224  Dressing Type Transparent 06/24/20 2224  Dressing Status Clean;Dry;Intact 06/24/20 2224  Antimicrobial disc in place? Yes 06/24/20 2224  Safety Lock Not Applicable [id number]  Line Care Connections checked and tightened 06/24/20 2224  Line Adjustment (NICU/IV Team Only) No 06/24/20 2224  Dressing Intervention New dressing 06/24/20 2224  Dressing Change Due 07/01/20 06/24/20 2224       Cass Vandermeulen, Nicolette Bang 06/24/2020, 10:26 PM

## 2020-06-24 NOTE — ED Notes (Signed)
IV team repaged at this time.

## 2020-06-25 DIAGNOSIS — I5023 Acute on chronic systolic (congestive) heart failure: Secondary | ICD-10-CM | POA: Diagnosis not present

## 2020-06-25 DIAGNOSIS — I5084 End stage heart failure: Secondary | ICD-10-CM | POA: Diagnosis not present

## 2020-06-25 DIAGNOSIS — D61818 Other pancytopenia: Secondary | ICD-10-CM | POA: Diagnosis not present

## 2020-06-25 DIAGNOSIS — I5043 Acute on chronic combined systolic (congestive) and diastolic (congestive) heart failure: Secondary | ICD-10-CM | POA: Diagnosis not present

## 2020-06-25 DIAGNOSIS — N184 Chronic kidney disease, stage 4 (severe): Secondary | ICD-10-CM | POA: Diagnosis not present

## 2020-06-25 DIAGNOSIS — Z66 Do not resuscitate: Secondary | ICD-10-CM | POA: Diagnosis not present

## 2020-06-25 DIAGNOSIS — N179 Acute kidney failure, unspecified: Secondary | ICD-10-CM | POA: Diagnosis not present

## 2020-06-25 DIAGNOSIS — I130 Hypertensive heart and chronic kidney disease with heart failure and stage 1 through stage 4 chronic kidney disease, or unspecified chronic kidney disease: Secondary | ICD-10-CM | POA: Diagnosis not present

## 2020-06-25 DIAGNOSIS — Z20822 Contact with and (suspected) exposure to covid-19: Secondary | ICD-10-CM | POA: Diagnosis not present

## 2020-06-25 DIAGNOSIS — Z515 Encounter for palliative care: Secondary | ICD-10-CM | POA: Diagnosis not present

## 2020-06-25 LAB — BASIC METABOLIC PANEL
Anion gap: 9 (ref 5–15)
Anion gap: 12 (ref 5–15)
BUN: 68 mg/dL — ABNORMAL HIGH (ref 8–23)
BUN: 70 mg/dL — ABNORMAL HIGH (ref 8–23)
CO2: 32 mmol/L (ref 22–32)
CO2: 29 mmol/L (ref 22–32)
Calcium: 8.5 mg/dL — ABNORMAL LOW (ref 8.9–10.3)
Calcium: 8.8 mg/dL — ABNORMAL LOW (ref 8.9–10.3)
Chloride: 93 mmol/L — ABNORMAL LOW (ref 98–111)
Chloride: 96 mmol/L — ABNORMAL LOW (ref 98–111)
Creatinine, Ser: 3.31 mg/dL — ABNORMAL HIGH (ref 0.61–1.24)
Creatinine, Ser: 3.46 mg/dL — ABNORMAL HIGH (ref 0.61–1.24)
GFR, Estimated: 18 mL/min — ABNORMAL LOW (ref >60)
GFR, Estimated: 17 mL/min — ABNORMAL LOW (ref >60)
Glucose, Bld: 158 mg/dL — ABNORMAL HIGH (ref 70–99)
Glucose, Bld: 122 mg/dL — ABNORMAL HIGH (ref 70–99)
Potassium: 3.2 mmol/L — ABNORMAL LOW (ref 3.5–5.1)
Potassium: 3.9 mmol/L (ref 3.5–5.1)
Sodium: 134 mmol/L — ABNORMAL LOW (ref 135–145)
Sodium: 137 mmol/L (ref 135–145)

## 2020-06-25 LAB — CBC
HCT: 26.6 % — ABNORMAL LOW (ref 39.0–52.0)
Hemoglobin: 8.5 g/dL — ABNORMAL LOW (ref 13.0–17.0)
MCH: 28.5 pg (ref 26.0–34.0)
MCHC: 32 g/dL (ref 30.0–36.0)
MCV: 89.3 fL (ref 80.0–100.0)
Platelets: 94 10*3/uL — ABNORMAL LOW (ref 150–400)
RBC: 2.98 MIL/uL — ABNORMAL LOW (ref 4.22–5.81)
RDW: 16.2 % — ABNORMAL HIGH (ref 11.5–15.5)
WBC: 3 10*3/uL — ABNORMAL LOW (ref 4.0–10.5)
nRBC: 0 % (ref 0.0–0.2)

## 2020-06-25 LAB — GLUCOSE, CAPILLARY
Glucose-Capillary: 118 mg/dL — ABNORMAL HIGH (ref 70–99)
Glucose-Capillary: 151 mg/dL — ABNORMAL HIGH (ref 70–99)
Glucose-Capillary: 61 mg/dL — ABNORMAL LOW (ref 70–99)
Glucose-Capillary: 82 mg/dL (ref 70–99)
Glucose-Capillary: 103 mg/dL — ABNORMAL HIGH (ref 70–99)

## 2020-06-25 LAB — T3, FREE: T3, Free: 1 pg/mL — ABNORMAL LOW (ref 2.0–4.4)

## 2020-06-25 LAB — COOXEMETRY PANEL
Carboxyhemoglobin: 1.3 % (ref 0.5–1.5)
Methemoglobin: 0.8 % (ref 0.0–1.5)
O2 Saturation: 63.1 %
Total hemoglobin: 8.8 g/dL — ABNORMAL LOW (ref 12.0–16.0)

## 2020-06-25 MED ORDER — POTASSIUM CHLORIDE CRYS ER 20 MEQ PO TBCR
40.0000 meq | EXTENDED_RELEASE_TABLET | Freq: Two times a day (BID) | ORAL | Status: AC
  Administered 2020-06-25 (×2): 40 meq via ORAL
  Filled 2020-06-25 (×2): qty 2

## 2020-06-25 MED ORDER — MIDODRINE HCL 5 MG PO TABS
5.0000 mg | ORAL_TABLET | Freq: Three times a day (TID) | ORAL | Status: DC
  Administered 2020-06-25 – 2020-06-26 (×5): 5 mg via ORAL
  Filled 2020-06-25 (×4): qty 1

## 2020-06-25 MED ORDER — METOLAZONE 5 MG PO TABS
2.5000 mg | ORAL_TABLET | Freq: Once | ORAL | Status: AC
  Administered 2020-06-25: 2.5 mg via ORAL
  Filled 2020-06-25: qty 1

## 2020-06-25 MED ORDER — ATORVASTATIN CALCIUM 10 MG PO TABS
20.0000 mg | ORAL_TABLET | Freq: Every day | ORAL | Status: DC
  Administered 2020-06-25 – 2020-06-29 (×5): 20 mg via ORAL
  Filled 2020-06-25 (×5): qty 2

## 2020-06-25 MED ORDER — AMIODARONE HCL 200 MG PO TABS
200.0000 mg | ORAL_TABLET | Freq: Every day | ORAL | Status: DC
  Administered 2020-06-25 – 2020-06-29 (×5): 200 mg via ORAL
  Filled 2020-06-25 (×5): qty 1

## 2020-06-25 MED ORDER — MIDODRINE HCL 5 MG PO TABS: 5.0000 mg | ORAL_TABLET | Freq: Three times a day (TID) | ORAL | Status: DC

## 2020-06-25 NOTE — Progress Notes (Addendum)
Patient ID: [name] Ridges., male   DOB: [birth year], 85 y.o.   MRN: [id number]     Advanced Heart Failure Rounding Note  PCP-Cardiologist: [name], MD   Subjective:    Thinks his breathing is better but has not been out of bed.    Milrinone 0.25 started yesterday, PICC placed.  This morning, co-ox 63% with CVP 13.  SBP 80s-90s.   Creatinine mildly lower.    Objective:   Weight Range: 70.4 kg Body mass index is 21.65 kg/m.   Vital Signs:   Temp:  [97 F (36.1 C)] 97 F (36.1 C) (04/01 2042) Pulse Rate:  [40-56] 52 (04/02 0448) Resp:  [12-24] 16 (04/02 0448) BP: (92-116)/(48-83) 93/48 (04/02 0448) SpO2:  [93 %-100 %] 93 % (04/02 0448) Weight:  [69.7 kg-70.4 kg] 70.4 kg (04/02 0623)    Weight change: Filed Weights   06/24/20 2042 06/25/20 0623  Weight: 69.7 kg 70.4 kg    Intake/Output:   Intake/Output Summary (Last 24 hours) at 06/25/2020 0733 Last data filed at 06/25/2020 0630 Gross per 24 hour  Intake 23 ml  Output 500 ml  Net -477 ml      Physical Exam    General:  Well appearing. No resp difficulty HEENT: Normal Neck: Supple. JVP 12 cm. Carotids 2+ bilat; no bruits. No lymphadenopathy or thyromegaly appreciated. Cor: PMI nondisplaced. Regular rate & rhythm. No rubs, gallops or murmurs. Lungs: Clear Abdomen: Soft, nontender, nondistended. No hepatosplenomegaly. No bruits or masses. Good bowel sounds. Extremities: No cyanosis, clubbing, rash. 1+ edema to knees.  Neuro: Alert & orientedx3, cranial nerves grossly intact. moves all 4 extremities w/o difficulty. Affect pleasant   Telemetry   NSR mixed with runs of ?AIVR (personally reviewed)  Labs    CBC Recent Labs    06/24/20 1316 06/24/20 1318 06/24/20 1326 06/25/20 0500  WBC 4.9 5.3  --  3.0*  NEUTROABS 4.0  --   --   --   HGB 10.5* 10.3* 10.9* 8.5*  HCT 32.9* 32.3* 32.0* 26.6*  MCV 91.9 91.8  --  89.3  PLT 116* 121*  --  94*   Basic Metabolic Panel Recent Labs     06/24/20 1318 06/24/20 1326 06/24/20 1743 06/25/20 0500  NA 137 137  --  134*  K 4.1 4.1  --  3.2*  CL 95* 96*  --  93*  CO2 33*  --   --  32  GLUCOSE 126* 119*  --  158*  BUN 72* 68*  --  68*  CREATININE 3.41* 3.40*  --  3.31*  CALCIUM 9.0  --   --  8.5*  MG  --   --  2.6*  --    Liver Function Tests Recent Labs    06/24/20 1316 06/24/20 1318  AST 43* 41  ALT 27 28  ALKPHOS 114 112  BILITOT 1.1 1.0  PROT 6.4* 6.5  ALBUMIN 3.2* 3.3*   No results for input(s): LIPASE, AMYLASE in the last 72 hours. Cardiac Enzymes No results for input(s): CKTOTAL, CKMB, CKMBINDEX, TROPONINI in the last 72 hours.  BNP: BNP (last 3 results) Recent Labs    11/26/19 1535 05/10/20 1217 06/24/20 1316  BNP 1,752.4* 1,821.5* 1,353.5*    ProBNP (last 3 results) No results for input(s): PROBNP in the last 8760 hours.   D-Dimer No results for input(s): DDIMER in the last 72 hours. Hemoglobin A1C Recent Labs    06/24/20 1857  HGBA1C 7.5*   Fasting Lipid Panel  No results for input(s): CHOL, HDL, LDLCALC, TRIG, CHOLHDL, LDLDIRECT in the last 72 hours. Thyroid Function Tests Recent Labs    06/24/20 1426 06/24/20 1743  TSH 10.242*  --   T3FREE  --  1.0*    Other results:   Imaging    DG Chest 2 View  Result Date: 06/24/2020 CLINICAL DATA:  Shortness of breath. Additional history provided: Shortness of breath for 1 week, hypertension, diabetes, former smoker. EXAM: CHEST - 2 VIEW COMPARISON:  Prior chest radiographs 10/27/2019 and earlier. Chest CT 11/26/2018. FINDINGS: Redemonstrated single lead left chest implantable cardiac device. Prior median sternotomy/CABG. Unchanged cardiomegaly. Aortic atherosclerosis. Unchanged opacity at the right lung base, reflecting a combination of scarring and pleural effusion. As before, there is chronic right-sided volume loss. Additional hazy opacity within the right lung. The left lung is clear. No left-sided pleural effusion. No evidence of  pneumothorax. No acute bony abnormality identified. IMPRESSION: Unchanged opacity at the right lung base, likely reflecting a combination of scarring and pleural effusion. Associated chronic right-sided volume loss. Additional hazy opacity within the right lung which may reflect the layering component of a right-sided pleural effusion, asymmetric edema, subsegmental atelectasis or possibly infection. Clinical correlation is recommended. Unchanged cardiomegaly. Aortic Atherosclerosis (ICD10-I70.0). Electronically Signed   By: Kellie Simmering DO   On: 06/24/2020 14:37   CT HEAD WO CONTRAST  Result Date: 06/24/2020 CLINICAL DATA:  Neuro deficit, acute, stroke suspected; stroke like symptoms beginning 1 week ago. Slurred speech and confusion. EXAM: CT HEAD WITHOUT CONTRAST TECHNIQUE: Contiguous axial images were obtained from the base of the skull through the vertex without intravenous contrast. COMPARISON:  No pertinent prior exams available for comparison. FINDINGS: Brain: Mild-to-moderate cerebral atrophy. Commensurate prominence of the ventricles and sulci. Comparatively mild cerebellar atrophy. There is no acute intracranial hemorrhage. No demarcated cortical infarct. No extra-axial fluid collection. No evidence of intracranial mass. No midline shift. Vascular: No hyperdense vessel.  Atherosclerotic calcifications. Skull: Normal. Negative for fracture or focal lesion. Sinuses/Orbits: Visualized orbits show no acute finding. Trace bilateral ethmoid sinus mucosal thickening. Small right maxillary sinus mucous retention cyst. IMPRESSION: No evidence of acute intracranial abnormality. Mild-to-moderate cerebral atrophy with comparatively mild cerebellar atrophy. Electronically Signed   By: Kellie Simmering DO   On: 06/24/2020 14:32   [place] EKG SITE RITE  Result Date: 06/24/2020 If Site Rite image not attached, placement could not be confirmed due to current cardiac rhythm.     Medications:     Scheduled  Medications: . apixaban  2.5 mg Oral BID  . Chlorhexidine Gluconate Cloth  6 each Topical Daily  . donepezil  10 mg Oral QHS  . insulin aspart  0-15 Units Subcutaneous TID WC  . insulin aspart  0-5 Units Subcutaneous QHS  . metolazone  2.5 mg Oral Once  . midodrine  5 mg Oral TID WC  . pantoprazole  40 mg Oral Daily  . potassium chloride  40 mEq Oral BID  . simvastatin  20 mg Oral q1800  . sodium chloride flush  10-40 mL Intracatheter Q12H  . sodium chloride flush  3 mL Intravenous Once  . sodium chloride flush  3 mL Intravenous Q12H  . Vericiguat  10 mg Oral Daily     Infusions: . sodium chloride    . furosemide (LASIX) 200 mg in dextrose 5% 100 mL ('2mg'$ /mL) infusion 10 mg/hr (06/25/20 0551)  . milrinone 0.25 mcg/kg/min (06/25/20 0235)     PRN Medications:  sodium chloride, acetaminophen, ondansetron (ZOFRAN) IV,  silver sulfADIAZINE, sodium chloride flush, sodium chloride flush   Assessment/Plan   1.Acute on chronic systolic CHF: Ischemic cardiomyopathy. Echo in 8/20 with EF 20-25%, moderately decreased RV systolic function. [employer] ICD. CHF has been complicated by CKD stage 4. RHC in 2/21 showed low but not markedly low cardiac output, he was admitted for diuresis with milrinone. Milrinone was weaned off. Echo in 2/22showed EF 25-30% with global hypokinesis with apical akinesis, mildly decreased RV systolic function, PASP 56, moderate MR, IVC dilated. He has become markedly more symptomatic over the last week prior to admission. He was initially volume overloaded with weight up 12 lbs in a week and REDS clip markedly high at 57%.  Creatinine up to 3.4.  PICC placed, milrinone 0.25 started.  This morning, co-ox 63% with CVP 13-14.  Creatinine mildly lower at 3.3.  -Continue milrinone 0.25 mcg/kg/min.  - Add midodrine 5 mg tid with soft BP.  - Continue Lasix 10 mg/hr, will give dose of metolazone 2.5 x 1 today and replace K.  Repeat BMET in pm.  -Continue Verquvo10 mg  daily.  - Hold Jardiance for now. - He will stay off Entresto and spironolactone with elevated creatinine.  - He is off beta blocker with bradycardia and suspected low output  - Narrow QRS,has not beencandidate for upgrade to CRT.  - PYP scan in 5/21 not suggestive of transthyretin amyloidosis.  - Unna boots.  - Not candidate for advanced therapies. Suspect he is nearing end stage with progressive CHF and renal failure.  2. CAD: S/p CABG 2007, patent grafts on LHC in 2016. No exertional chest pain.  - Given stable CAD and Eliquis use, he is not on ASA.  - Continue statin.  3. Atrial fibrillation: Paroxysmal. He remains in NSR today.  -Last ECG with normal QTc, can add back amiodarone 200 mg daily.   - Repeat ECG today.  - He is on Eliquis 2.5 mg bid. 4. Right chronic pleural effusion: small on CXR today  - plan diuresis per above   5. AKI on CKD stage 4: Sees nephrology as outpatient. SCr up from prior baseline of 2.5>>3.4 at admission . Suspect low output/ cardiorenal.  Creatinine mildly lower today at 3.3.  - plan inotropic support w/ milrinone + diuresis  - follow BMET 6. Carotid stenosis: Repeat dopplers in 10/22.  7.Moderate OSA: Does not have CPAP yet.  8. Neuro: Slurred speech. ?CVA versus encephalopathy.  Head CT negative. Ammonia level ok at 25. No fever.  9. Thrombocytopenia: Mild, follow closely.    Mobilize.  Length of Stay: 1  [name], MD  06/25/2020, 7:33 AM  Advanced Heart Failure Team Pager [phone] (M-F; 7a - 5p)  Please contact [facility] for night-coverage after hours (5p -7a ) and weekends on [email or web address]

## 2020-06-25 NOTE — Evaluation (Signed)
Occupational Therapy Evaluation Patient Details Name: [name]. MRN: [id number] DOB: 1935/07/30 Today's Date: 06/25/2020    History of Present Illness 85 y/o presented to ED on 4/1 after recommended by cardiology for concerns of general weakness, confusion, slurred speech, increased swelling and weight gain. Patient found to be in volume overloaded due to acute on chronic systolic heart failure. PMH: CAD, CHF, HTN, HLD, Pulmonary HTN, renal insufficiency, DM type 2, cardiomyopathy.   Clinical Impression   Patient admitted for the above diagnosis.  PTA he lived in [place] with his spouse.  The spouse uses a RW and has a HHA for assist.  Spouse would not be able to physically the patient.  He continues to drive, uses a RW for mobility, and completed his ADL from a sit/stand level.  Deficits are listed below.  Currently he is needing up to [place] for mobility at a RW level, and up to Mod A for ADL.  Patient's goal is home, and he is open to [facility] services.  OT will continue to follow him in the acute setting to maximize functional status.      Follow Up Recommendations  Home health OT    Equipment Recommendations  None recommended by OT    Recommendations for Other Services       Precautions / Restrictions Precautions Precautions: Fall Restrictions Weight Bearing Restrictions: No Other Position/Activity Restrictions: PICC      Mobility Bed Mobility Overal bed mobility: Needs Assistance Bed Mobility: Supine to Sit;Sit to Supine     Supine to sit: Supervision Sit to supine: Supervision   General bed mobility comments: increased time Patient Response: Cooperative  Transfers Overall transfer level: Needs assistance Equipment used: Rolling walker (2 wheeled) Transfers: Sit to/from Stand Sit to Stand: Min guard;Min assist         General transfer comment: low surface    Balance Overall balance assessment: Needs assistance Sitting-balance support: Feet supported;No upper  extremity supported Sitting balance-Leahy Scale: Good     Standing balance support: Bilateral upper extremity supported;During functional activity Standing balance-Leahy Scale: Poor Standing balance comment: reliant on UE support                           ADL either performed or assessed with clinical judgement   ADL Overall ADL's : Needs assistance/impaired Eating/Feeding: Independent   Grooming: Wash/dry hands;Wash/dry face;Set up;Sitting           Upper Body Dressing : Supervision/safety;Sitting Upper Body Dressing Details (indicate cue type and reason): PICC line Lower Body Dressing: Moderate assistance;Sitting/lateral leans               Functional mobility during ADLs: Min guard;Rolling walker       Vision Baseline Vision/History: Retinopathy;Wears glasses Patient Visual Report: No change from baseline       Perception     Praxis      Pertinent Vitals/Pain Pain Assessment: Faces Pain Score: 3  Faces Pain Scale: Hurts a little bit Pain Location: R shoulder Pain Descriptors / Indicators: Aching Pain Intervention(s): Monitored during session     Hand Dominance Right   Extremity/Trunk Assessment Upper Extremity Assessment Upper Extremity Assessment: Generalized weakness   Lower Extremity Assessment Lower Extremity Assessment: Generalized weakness   Cervical / Trunk Assessment Cervical / Trunk Assessment: Kyphotic   Communication Communication Communication: No difficulties;HOH   Cognition Arousal/Alertness: Awake/alert Behavior During Therapy: WFL for tasks assessed/performed Overall Cognitive Status: Within Functional Limits for  tasks assessed                                     General Comments  VSS    Exercises     Shoulder Instructions      Home Living Family/patient expects to be discharged to:: Private residence Living Arrangements: Spouse/significant other Available Help at Discharge:  Family;Available 24 hours/day Type of Home: Independent living facility (townhome) Home Access: Level entry     Home Layout: One level     Bathroom Shower/Tub: Occupational psychologist: Handicapped height     Home Equipment: Environmental consultant - 4 wheels;Walker - 2 wheels;Cane - single point;Shower seat;Grab bars - toilet;Grab bars - tub/shower;Other (comment);Electric scooter   Additional Comments: takes scooter to  dinning room to pick up ordered meals      Prior Functioning/Environment Level of Independence: Independent with assistive device(s)        Comments: uses rollator for ambulation. Generally order meals from ILF dinning room, cleaning person, spouse has a HHA that assists her.  Patient still drives locally and is Mod I with bathing and dressing.        OT Problem List: Decreased strength;Decreased range of motion;Decreased activity tolerance;Impaired balance (sitting and/or standing);Pain      OT Treatment/Interventions: Self-care/ADL training;Therapeutic exercise;Energy conservation;DME and/or AE instruction;Balance training;Therapeutic activities    OT Goals(Current goals can be found in the care plan section) Acute Rehab OT Goals Patient Stated Goal: keep moving OT Goal Formulation: With patient Time For Goal Achievement: 07/09/20 Potential to Achieve Goals: Fair ADL Goals Pt Will Perform Grooming: with modified independence;sitting;standing Pt Will Perform Lower Body Bathing: with supervision;sit to/from stand Pt Will Perform Lower Body Dressing: with supervision;sit to/from stand;with adaptive equipment Pt Will Transfer to Toilet: with modified independence;regular height toilet;ambulating Pt Will Perform Toileting - Clothing Manipulation and hygiene: sit to/from stand;with modified independence  OT Frequency: Min 2X/week   Barriers to D/C:    none noted       Co-evaluation              AM-PAC OT "6 Clicks" Daily Activity     Outcome Measure  Help from another person eating meals?: None Help from another person taking care of personal grooming?: None Help from another person toileting, which includes using toliet, bedpan, or urinal?: A Little Help from another person bathing (including washing, rinsing, drying)?: A Lot Help from another person to put on and taking off regular upper body clothing?: A Little Help from another person to put on and taking off regular lower body clothing?: A Lot 6 Click Score: 18   End of Session Equipment Utilized During Treatment: Rolling walker Nurse Communication: Mobility status  Activity Tolerance: Patient tolerated treatment well Patient left: in bed;with call bell/phone within reach;with family/visitor present  OT Visit Diagnosis: Unsteadiness on feet (R26.81);Muscle weakness (generalized) (M62.81);Pain Pain - Right/Left: Right Pain - part of body: Shoulder                Time: [id number] OT Time Calculation (min): 21 min Charges:  OT General Charges $OT Visit: 1 Visit OT Evaluation $OT Eval Moderate Complexity: 1 Mod  06/25/2020  Rich, OTR/L  Acute Rehabilitation Services  Office:  [phone]   Metta Clines 06/25/2020, 4:59 PM

## 2020-06-25 NOTE — Progress Notes (Signed)
Orthopedic Tech Progress Note Patient Details:  [name] 1936-03-10 [id number]  Ortho Devices Type of Ortho Device: Louretta Parma boot Ortho Device/Splint Location: Bilateral Ortho Device/Splint Interventions: Ordered,Application   Post Interventions Patient Tolerated: Well   [name] 06/25/2020, 4:35 PM

## 2020-06-25 NOTE — Progress Notes (Signed)
   06/25/20 1333  Assess: MEWS Score  BP (!) 96/52  Pulse Rate (!) 57  ECG Heart Rate (!) 49  Level of Consciousness Alert  SpO2 94 %  Assess: MEWS Score  MEWS Temp 0  MEWS Systolic 1  MEWS Pulse 1  MEWS RR 0  MEWS LOC 0  MEWS Score 2  MEWS Score Color Yellow  Assess: if the MEWS score is Yellow or Red  Were vital signs taken at a resting state? Yes  Focused Assessment No change from prior assessment  Early Detection of Sepsis Score *See Row Information* Medium  MEWS guidelines implemented *See Row Information* Yes  Treat  MEWS Interventions Escalated (See documentation below)  Pain Scale 0-10  Pain Score 0  Take Vital Signs  Increase Vital Sign Frequency  Yellow: Q 2hr X 2 then Q 4hr X 2, if remains yellow, continue Q 4hrs  Escalate  MEWS: Escalate Yellow: discuss with charge nurse/RN and consider discussing with provider and RRT  Notify: Charge Nurse/RN  Name of Charge Nurse/RN Notified Lonny Prude

## 2020-06-25 NOTE — Evaluation (Signed)
Physical Therapy Evaluation Patient Details Name: [name]. MRN: [id number] DOB: 10/13/1935 Today's Date: 06/25/2020   History of Present Illness  85 y/o presented to ED on 4/1 after recommended by cardiology for concerns of general weakness, confusion, slurred speech, increased swelling and weight gain. Patient found to be in volume overloaded due to acute on chronic systolic heart failure. PMH: CAD, CHF, HTN, HLD, Pulmonary HTN, renal insufficiency, DM type 2, cardiomyopathy.  Clinical Impression  PTA, patient lives with wife at [facility] and reports independence with rollator. Patient overall min guard for OOB mobility with RW. Patient with slowed gait speed and flexed posture throughout. Patient presents with generalized weakness, impaired balance, decreased activity tolerance. Patient will benefit from skilled PT services during acute stay to address listed deficits. Recommend HHPT following discharge to maximize functional mobility and safety.      Follow Up Recommendations Home health PT;Supervision for mobility/OOB    Equipment Recommendations  Rolling Anetra Czerwinski with 5" wheels    Recommendations for Other Services       Precautions / Restrictions Precautions Precautions: Fall Restrictions Weight Bearing Restrictions: No      Mobility  Bed Mobility Overal bed mobility: Needs Assistance Bed Mobility: Sit to Supine       Sit to supine: Supervision   General bed mobility comments: supervision for safety    Transfers Overall transfer level: Needs assistance Equipment used: Rolling Max Nuno (2 wheeled) Transfers: Sit to/from Stand Sit to Stand: Min guard;Min assist         General transfer comment: Light minA-min guard for sit to stand with RW, cues for hand placement  Ambulation/Gait Ambulation/Gait assistance: Min guard Gait Distance (Feet): 60 Feet Assistive device: Rolling Kimra Kantor (2 wheeled) Gait Pattern/deviations: Step-through pattern;Decreased  stride length;Trunk flexed;Wide base of support Gait velocity: decreased   General Gait Details: min guard for safety. Slow steady gait with trunk flexed and neck flexed. Cues for upright posture, patient with increased neck stiffness  Stairs            Wheelchair Mobility    Modified Rankin (Stroke Patients Only)       Balance Overall balance assessment: Needs assistance Sitting-balance support: No upper extremity supported;Feet supported Sitting balance-Leahy Scale: Fair     Standing balance support: Bilateral upper extremity supported;During functional activity Standing balance-Leahy Scale: Poor Standing balance comment: reliant on UE support                             Pertinent Vitals/Pain Pain Assessment: 0-10 Pain Score: 3  Pain Location: R shoulder Pain Descriptors / Indicators: Aching Pain Intervention(s): Monitored during session;Repositioned    Home Living Family/patient expects to be discharged to:: Private residence Living Arrangements: Spouse/significant other Available Help at Discharge: Family;Available 24 hours/day Type of Home: House Home Access: Level entry     Home Layout: One level Home Equipment: Kaliana Albino - 4 wheels;Ethelmae Ringel - 2 wheels;Cane - single point;Shower seat;Grab bars - toilet;Grab bars - tub/shower;Other (comment) (sit to stand lift)      Prior Function Level of Independence: Independent with assistive device(s)         Comments: uses rollator for ambulation. Wife states they're from [place]        Extremity/Trunk Assessment   Upper Extremity Assessment Upper Extremity Assessment: Defer to OT evaluation    Lower Extremity Assessment Lower Extremity Assessment: Generalized weakness    Cervical / Trunk  Assessment Cervical / Trunk Assessment: Kyphotic  Communication   Communication: No difficulties  Cognition Arousal/Alertness: Awake/alert Behavior During Therapy: WFL for tasks  assessed/performed Overall Cognitive Status: Within Functional Limits for tasks assessed                                        General Comments General comments (skin integrity, edema, etc.): VSS    Exercises     Assessment/Plan    PT Assessment Patient needs continued PT services  PT Problem List Decreased strength;Decreased range of motion;Decreased activity tolerance;Decreased balance;Decreased mobility;Cardiopulmonary status limiting activity       PT Treatment Interventions DME instruction;Gait training;Functional mobility training;Therapeutic activities;Balance training;Therapeutic exercise;Patient/family education    PT Goals (Current goals can be found in the Care Plan section)  Acute Rehab PT Goals Patient Stated Goal: to go home PT Goal Formulation: With patient Time For Goal Achievement: 07/09/20 Potential to Achieve Goals: Good    Frequency Min 3X/week   Barriers to discharge        Co-evaluation               AM-PAC PT "6 Clicks" Mobility  Outcome Measure Help needed turning from your back to your side while in a flat bed without using bedrails?: A Little Help needed moving from lying on your back to sitting on the side of a flat bed without using bedrails?: A Little Help needed moving to and from a bed to a chair (including a wheelchair)?: A Little Help needed standing up from a chair using your arms (e.g., wheelchair or bedside chair)?: A Little Help needed to walk in hospital room?: A Little Help needed climbing 3-5 steps with a railing? : A Lot 6 Click Score: 17    End of Session Equipment Utilized During Treatment: Gait belt Activity Tolerance: Patient tolerated treatment well Patient left: in bed;with call bell/phone within reach;with bed alarm set;with family/visitor present Nurse Communication: Mobility status PT Visit Diagnosis: Unsteadiness on feet (R26.81);Muscle weakness (generalized) (M62.81);Other abnormalities of  gait and mobility (R26.89)    Time: 1409-1430 PT Time Calculation (min) (ACUTE ONLY): 21 min   Charges:   PT Evaluation $PT Eval Moderate Complexity: 1 Mod          Jeff Frieden A. Gilford Rile PT, DPT Acute Rehabilitation Services Pager [phone] Office [phone]   Linna Hoff 06/25/2020, 3:38 PM

## 2020-06-26 DIAGNOSIS — I5043 Acute on chronic combined systolic (congestive) and diastolic (congestive) heart failure: Secondary | ICD-10-CM | POA: Diagnosis not present

## 2020-06-26 DIAGNOSIS — N179 Acute kidney failure, unspecified: Secondary | ICD-10-CM | POA: Diagnosis not present

## 2020-06-26 DIAGNOSIS — Z66 Do not resuscitate: Secondary | ICD-10-CM | POA: Diagnosis not present

## 2020-06-26 DIAGNOSIS — Z7189 Other specified counseling: Secondary | ICD-10-CM | POA: Diagnosis not present

## 2020-06-26 DIAGNOSIS — Z515 Encounter for palliative care: Secondary | ICD-10-CM

## 2020-06-26 LAB — BASIC METABOLIC PANEL
Anion gap: 11 (ref 5–15)
BUN: 69 mg/dL — ABNORMAL HIGH (ref 8–23)
CO2: 33 mmol/L — ABNORMAL HIGH (ref 22–32)
Calcium: 8.9 mg/dL (ref 8.9–10.3)
Chloride: 96 mmol/L — ABNORMAL LOW (ref 98–111)
Creatinine, Ser: 3.45 mg/dL — ABNORMAL HIGH (ref 0.61–1.24)
GFR, Estimated: 17 mL/min — ABNORMAL LOW (ref >60)
Glucose, Bld: 120 mg/dL — ABNORMAL HIGH (ref 70–99)
Potassium: 4.1 mmol/L (ref 3.5–5.1)
Sodium: 140 mmol/L (ref 135–145)

## 2020-06-26 LAB — CBC
HCT: 26.7 % — ABNORMAL LOW (ref 39.0–52.0)
Hemoglobin: 8.9 g/dL — ABNORMAL LOW (ref 13.0–17.0)
MCH: 29.9 pg (ref 26.0–34.0)
MCHC: 33.3 g/dL (ref 30.0–36.0)
MCV: 89.6 fL (ref 80.0–100.0)
Platelets: 105 10*3/uL — ABNORMAL LOW (ref 150–400)
RBC: 2.98 MIL/uL — ABNORMAL LOW (ref 4.22–5.81)
RDW: 16.2 % — ABNORMAL HIGH (ref 11.5–15.5)
WBC: 3.9 10*3/uL — ABNORMAL LOW (ref 4.0–10.5)
nRBC: 0 % (ref 0.0–0.2)

## 2020-06-26 LAB — GLUCOSE, CAPILLARY
Glucose-Capillary: 100 mg/dL — ABNORMAL HIGH (ref 70–99)
Glucose-Capillary: 197 mg/dL — ABNORMAL HIGH (ref 70–99)
Glucose-Capillary: 112 mg/dL — ABNORMAL HIGH (ref 70–99)
Glucose-Capillary: 118 mg/dL — ABNORMAL HIGH (ref 70–99)

## 2020-06-26 LAB — COOXEMETRY PANEL
Carboxyhemoglobin: 1.4 % (ref 0.5–1.5)
Methemoglobin: 1.2 % (ref 0.0–1.5)
O2 Saturation: 62.9 %
Total hemoglobin: 8.9 g/dL — ABNORMAL LOW (ref 12.0–16.0)

## 2020-06-26 MED ORDER — MIDODRINE HCL 5 MG PO TABS
10.0000 mg | ORAL_TABLET | Freq: Three times a day (TID) | ORAL | Status: DC
  Administered 2020-06-26 – 2020-06-28 (×6): 10 mg via ORAL
  Filled 2020-06-26 (×7): qty 2

## 2020-06-26 NOTE — Progress Notes (Addendum)
Patient ID: [name]., male   DOB: 1935/05/26, 85 y.o.   MRN: [id number]     Advanced Heart Failure Rounding Note  PCP-Cardiologist: [name], MD   Subjective:    Milrinone 0.25 started 4/1, PICC placed.  This morning, co-ox 63%. CVP unchanged at 13-14. Midodrine 5 tid added yesterday. SBP still 80s-90s.   No weight recorded today. I/O seem incomplete. Has a lot of clear urine in canister  Denies SOB, orthopnea or PNDs Wife in room. Their pastor present as well. Wife worried about slurred speech.   Creatinine stable at 3.4.    Objective:   Weight Range: 70.4 kg Body mass index is 21.65 kg/m.   Vital Signs:   Pulse Rate:  [48-60] 60 (04/03 1141) Resp:  [14-20] 14 (04/03 1141) BP: (85-100)/(45-57) 96/51 (04/03 1141) SpO2:  [92 %-94 %] 92 % (04/03 1141) Last BM Date: 06/24/20  Weight change: Filed Weights   06/24/20 2042 06/25/20 0623  Weight: 69.7 kg 70.4 kg    Intake/Output:   Intake/Output Summary (Last 24 hours) at 06/26/2020 1530 Last data filed at 06/26/2020 1056 Gross per 24 hour  Intake 400.27 ml  Output 1125 ml  Net -724.73 ml      Physical Exam    General:  Elderly Weak appearing. Flat affect No resp difficulty HEENT: normal Neck: supple. JVP to jaw  Carotids 2+ bilat; no bruits. No lymphadenopathy or thryomegaly appreciated. Cor: PMI nondisplaced. Regular rate & rhythm.2/6 MR Lungs: clear Abdomen: soft, nontender, nondistended. No hepatosplenomegaly. No bruits or masses. Good bowel sounds. Extremities: no cyanosis, clubbing, rash, 2+ edema Neuro: alert & orientedx3, cranial nerves grossly intact. moves all 4 extremities w/o difficulty. Affect flat     Telemetry   NSR 60s with PVCs and runs of ?AIVR vs junctional with intermittent RBBB (personally reviewed)  Labs    CBC Recent Labs    06/24/20 1316 06/24/20 1318 06/25/20 0500 06/26/20 0500  WBC 4.9   < > 3.0* 3.9*  NEUTROABS 4.0  --   --   --   HGB 10.5*   < > 8.5*  8.9*  HCT 32.9*   < > 26.6* 26.7*  MCV 91.9   < > 89.3 89.6  PLT 116*   < > 94* 105*   < > = values in this interval not displayed.   Basic Metabolic Panel Recent Labs    06/24/20 1743 06/25/20 0500 06/25/20 1359 06/26/20 0500  NA  --    < > 137 140  K  --    < > 3.9 4.1  CL  --    < > 96* 96*  CO2  --    < > 29 33*  GLUCOSE  --    < > 122* 120*  BUN  --    < > 70* 69*  CREATININE  --    < > 3.46* 3.45*  CALCIUM  --    < > 8.8* 8.9  MG 2.6*  --   --   --    < > = values in this interval not displayed.   Liver Function Tests Recent Labs    06/24/20 1316 06/24/20 1318  AST 43* 41  ALT 27 28  ALKPHOS 114 112  BILITOT 1.1 1.0  PROT 6.4* 6.5  ALBUMIN 3.2* 3.3*   No results for input(s): LIPASE, AMYLASE in the last 72 hours. Cardiac Enzymes No results for input(s): CKTOTAL, CKMB, CKMBINDEX, TROPONINI in the last 72 hours.  BNP: BNP (  last 3 results) Recent Labs    11/26/19 1535 05/10/20 1217 06/24/20 1316  BNP 1,752.4* 1,821.5* 1,353.5*    ProBNP (last 3 results) No results for input(s): PROBNP in the last 8760 hours.   D-Dimer No results for input(s): DDIMER in the last 72 hours. Hemoglobin A1C Recent Labs    06/24/20 1857  HGBA1C 7.5*   Fasting Lipid Panel No results for input(s): CHOL, HDL, LDLCALC, TRIG, CHOLHDL, LDLDIRECT in the last 72 hours. Thyroid Function Tests Recent Labs    06/24/20 1426 06/24/20 1743  TSH 10.242*  --   T3FREE  --  1.0*    Other results:   Imaging    No results found.   Medications:     Scheduled Medications: . amiodarone  200 mg Oral Daily  . apixaban  2.5 mg Oral BID  . atorvastatin  20 mg Oral Daily  . Chlorhexidine Gluconate Cloth  6 each Topical Daily  . donepezil  10 mg Oral QHS  . insulin aspart  0-15 Units Subcutaneous TID WC  . insulin aspart  0-5 Units Subcutaneous QHS  . midodrine  5 mg Oral TID WC  . pantoprazole  40 mg Oral Daily  . sodium chloride flush  10-40 mL Intracatheter Q12H  .  sodium chloride flush  3 mL Intravenous Once  . sodium chloride flush  3 mL Intravenous Q12H  . Vericiguat  10 mg Oral Daily    Infusions: . sodium chloride    . furosemide (LASIX) 200 mg in dextrose 5% 100 mL ('2mg'$ /mL) infusion 10 mg/hr (06/26/20 0627)  . milrinone 0.25 mcg/kg/min (06/26/20 1434)    PRN Medications: sodium chloride, acetaminophen, ondansetron (ZOFRAN) IV, silver sulfADIAZINE, sodium chloride flush, sodium chloride flush   Assessment/Plan   1.Acute on chronic systolic CHF: Ischemic cardiomyopathy. Echo in 8/20 with EF 20-25%, moderately decreased RV systolic function. St Jude ICD. CHF has been complicated by CKD stage 4. RHC in 2/21 showed low but not markedly low cardiac output, he was admitted for diuresis with milrinone. Milrinone was weaned off. Echo in 2/22showed EF 25-30% with global hypokinesis with apical akinesis, mildly decreased RV systolic function, PASP 56, moderate MR, IVC dilated. He has become markedly more symptomatic over the last week prior to admission. He was initially volume overloaded with weight up 12 lbs in a week and REDS clip markedly high at 57%. PICC placed, milrinone 0.25 started.  This morning, co-ox 63% with CVP stable at 13-14.  Creatinine stable at 3.4 -Continue milrinone 0.25 mcg/kg/min.  - Increase midodrine 10 mg tid with soft BP.  - Increase Lasix 15 mg/hr. Can repeat metolazone in am as needed -Wil stop Verquvo with hypotension - Hold Jardiance for now. - He will stay off Entresto and spironolactone with elevated creatinine.  - He is off beta blocker with bradycardia and suspected low output  - Narrow QRS,has not beencandidate for upgrade to CRT.  - PYP scan in 5/21 not suggestive of transthyretin amyloidosis.  - Continue Unna boots.  - Not candidate for advanced therapies. Suspect he is nearing end stage with progressive CHF and renal failure.  2. CAD: S/p CABG 2007, patent grafts on LHC in 2016. No s/s angina.  -  Given stable CAD and Eliquis use, he is not on ASA.  - Continue statin.  3. Atrial fibrillation: Paroxysmal. He remains in NSR today. Seems to have periods of junctional rhythm -Back on amio.   - Watch rhythm closely with periods of junctional rhythm. Low threshold to  stop.   - He is on Eliquis 2.5 mg bid. 4. Right chronic pleural effusion: small on CXR today  - plan diuresis per above   5. AKI on CKD stage 4: Sees nephrology as outpatient. SCr up from prior baseline of 2.5>>3.4 at admission . Suspect low output/ cardiorenal.  Creatinine stable at 3.4 today  - plan inotropic support w/ milrinone + diuresis  - follow BMET daily - not HD candidate 6. Carotid stenosis: Repeat dopplers in 10/22.  7.Moderate OSA: Does not have CPAP yet.  8. Neuro: Slurred speech. ?CVA versus encephalopathy.  Head CT negative. Ammonia level ok at 25. No fever.  - exam stable today.  9. Thrombocytopenia: Mild, follow closely.    He seems to be clearly end-stage with inotrope dependent HF, progressive renal failure, dementia, hypotension and profound weakness.  I spoke to his wife today about need for possible Palliative Care evaluation. I will place consult. She asked to be updated regularly by telephone.   Length of Stay: 2  Glori Bickers, MD  06/26/2020, 3:30 PM  Advanced Heart Failure Team Pager [phone] (M-F; 7a - 5p)  Please contact [facility] for night-coverage after hours (5p -7a ) and weekends on [email or web address]

## 2020-06-26 NOTE — Progress Notes (Signed)
Notified by CCMD that patient had a run of accelerated idioventricular. Patient asymptomatic. No c/o chest pain. No shortness of breath. On-call paged.

## 2020-06-26 NOTE — Consult Note (Signed)
Palliative Medicine Inpatient Consult Note  Reason for consult:  Goals of Care  HPI:  Per intake H&P --> 85 y/o presented to ED on 4/1 after recommended by cardiology for concerns of general weakness, confusion, slurred speech, increased swelling and weight gain. Patient found to be in volume overloaded due to acute on chronic systolic heart failure. PMH: CAD, CHF, HTN, HLD, Pulmonary HTN, renal insufficiency, DM type 2, cardiomyopathy.  Palliative care was asked to get involved in the setting of end-stage inotrope dependent HF, progressive renal failure, dementia, hypotension and profound weakness.  Clinical Assessment/Goals of Care:  *Please note that this is a verbal dictation therefore any spelling or grammatical errors are due to the "[place] [facility]" system interpretation.  I have reviewed medical records including EPIC notes, labs and imaging, received report from bedside RN, assessed the patient who was lying in bed going through his iPhone.    I met with [name] and his spouse, [name] to further discuss diagnosis prognosis, GOC, EOL wishes, disposition and options.   I introduced Palliative Medicine as specialized medical care for people living with serious illness. It focuses on providing relief from the symptoms and stress of a serious illness. The goal is to improve quality of life for both the patient and the family.  Diamante shares that he is from [facility] originally He and his wife lived in [place] until 1986 and then moved to [place]. They have been married for sixty-one years. They share two children, a son who suffers from a neuromuscular disease and lives at [place]. They also have a daughter who lives in [place], [place] half the time and [place] the other half. He is a retired Immunologist. Patient is religious and use to be  [name], he is [place].  Prior to admission [name] had been living in [facility] - he and his wife relocated there to live in a town  home in October of 2021. He had been slowly declining per his wife having a decrease in his activity tolerance. He uses a rollator for mobility. He was performing all the bADL's and iADL's independently until recently. His wife has taken over paying the bills.   In regard to his disease process Orland suffers from multiple co-morbidities --> CAD, CHF, HLD, Pulmonary HTN, renal insufficiency, DM type 2. His wife shares that they had just spoken to Dr. Haroldine Laws and they know that [name] is not doing well, [name] shares - "I know where I am going" in reference to his final demise. Reviewed that unfortunately his heart failure is now at a stage whereby life without additional measures would be difficult to sustain and I worry that [name] will continue to cycle in and out of the hospital given his heart and kidney dysfunction, as he has done prior. Both he and his wife acknowledge this.   A detailed discussion was had today regarding advanced directives.  Concepts specific to code status, artifical feeding and hydration, continued IV antibiotics and rehospitalization was had. A MOST was completed as below:   Cardiopulmonary Resuscitation: Do Not Attempt Resuscitation (DNR/No CPR)  Medical Interventions: Comfort Measures: Keep clean, warm, and dry. Use medication by any route, positioning, wound care, and other measures to relieve pain and suffering. Use oxygen, suction and manual treatment of airway obstruction as needed for comfort. Do not transfer to the hospital unless comfort needs cannot be met in current location.  Antibiotics: Determine use of limitation of antibiotics when infection occurs  IV Fluids: IV  fluids for a defined trial period  Feeding Tube: No feeding tube   The difference between a aggressive medical intervention path  and a palliative comfort care path for this patient at this time was had. We broached the topic of hospice care.  I described hospice as a service for patients for have a  life expectancy of < 6 months. It preserves dignity and quality at the end phases of life. The focus changes from curative to symptom relief. Reviewed that once we start weaning [name] off certain medications his symptom burden may increase therefore I would worry about [name] being alone in her home with him. [facility].  Options at this time are to possibly transition [name] back to [facility] in their skilled side for hospice care versus an inpatient hospice home.   Discussed the importance of continued conversation with family and their  medical providers regarding overall plan of care and treatment options, ensuring decisions are within the context of the patients values and GOCs.  Decision Maker: Uvaldo Rybacki (Spouse) [phone]  SUMMARY OF RECOMMENDATIONS   DNAR/DNI  MOST Completed, paper copy placed onto the chart electric copy can be found in [facility]  DNR Form Completed, paper copy placed onto the chart electric copy can be found in [facility]  TOC - Appreciate in put regarding if transfer to [facility] with hospice would be possible otherwise we may be looking at [facility]  Referral to Hospice placed  [place]  I will not be present tomorrow though my colleague, Florentina Jenny will continue to have conversations with [name] and his wife  Code Status/Advance Care Planning: DNAR/DNI    Palliative Prophylaxis:   Oral Care, Mobility, Delirium Precuations  Additional Recommendations (Limitations, Scope, Preferences):  Continue to treat what is treatable  Psycho-social/Spiritual:   Desire for further Chaplaincy support: Yes - [employer]  Additional Recommendations: Education on end stage heart failure   Prognosis: Poor in the setting of end stage heart failure with inotrope dependence hospice is appropriate  Discharge Planning: Discharge to home   Vitals:   06/26/20 0820 06/26/20 1141  BP: (!) 100/57 (!) 96/51  Pulse: (!) 59 60  Resp:   14  SpO2:  92%    Intake/Output Summary (Last 24 hours) at 06/26/2020 1639 Last data filed at 06/26/2020 1600 Gross per 24 hour  Intake 499.3 ml  Output 1125 ml  Net -625.7 ml   Last Weight  Most recent update: 06/25/2020  6:23 AM   Weight  70.4 kg (155 lb 3.2 oz)           Gen:  Frail elderly M HEENT: Dry mucous membranes CV: Regular rate and rhythm  PULM: On RA ABD: soft/nontender  EXT: No edema  Neuro: Alert and oriented x3  PPS: 20%   This conversation/these recommendations were discussed with patient primary care team, Dr. Haroldine Laws  Time In: 1600 Time Out: 1710 Total Time: 70 Greater than 50%  of this time was spent counseling and coordinating care related to the above assessment and plan.  [place] Team Team Cell Phone: [phone] Please utilize secure chat with additional questions, if there is no response within 30 minutes please call the above phone number  Palliative Medicine Team providers are available by phone from 7am to 7pm daily and can be reached through the team cell phone.  Should this patient require assistance outside of these hours, please call the patient's attending physician.

## 2020-06-27 ENCOUNTER — Ambulatory Visit (INDEPENDENT_AMBULATORY_CARE_PROVIDER_SITE_OTHER): Payer: PPO

## 2020-06-27 DIAGNOSIS — I5022 Chronic systolic (congestive) heart failure: Secondary | ICD-10-CM

## 2020-06-27 DIAGNOSIS — Z515 Encounter for palliative care: Secondary | ICD-10-CM | POA: Diagnosis not present

## 2020-06-27 DIAGNOSIS — N179 Acute kidney failure, unspecified: Secondary | ICD-10-CM | POA: Diagnosis not present

## 2020-06-27 DIAGNOSIS — I5043 Acute on chronic combined systolic (congestive) and diastolic (congestive) heart failure: Secondary | ICD-10-CM | POA: Diagnosis not present

## 2020-06-27 DIAGNOSIS — Z95810 Presence of automatic (implantable) cardiac defibrillator: Secondary | ICD-10-CM

## 2020-06-27 LAB — BASIC METABOLIC PANEL
Anion gap: 11 (ref 5–15)
BUN: 72 mg/dL — ABNORMAL HIGH (ref 8–23)
CO2: 35 mmol/L — ABNORMAL HIGH (ref 22–32)
Calcium: 9 mg/dL (ref 8.9–10.3)
Chloride: 92 mmol/L — ABNORMAL LOW (ref 98–111)
Creatinine, Ser: 3.66 mg/dL — ABNORMAL HIGH (ref 0.61–1.24)
GFR, Estimated: 16 mL/min — ABNORMAL LOW (ref >60)
Glucose, Bld: 129 mg/dL — ABNORMAL HIGH (ref 70–99)
Potassium: 3.6 mmol/L (ref 3.5–5.1)
Sodium: 138 mmol/L (ref 135–145)

## 2020-06-27 LAB — CBC
HCT: 28.5 % — ABNORMAL LOW (ref 39.0–52.0)
Hemoglobin: 9.3 g/dL — ABNORMAL LOW (ref 13.0–17.0)
MCH: 28.8 pg (ref 26.0–34.0)
MCHC: 32.6 g/dL (ref 30.0–36.0)
MCV: 88.2 fL (ref 80.0–100.0)
Platelets: 102 10*3/uL — ABNORMAL LOW (ref 150–400)
RBC: 3.23 MIL/uL — ABNORMAL LOW (ref 4.22–5.81)
RDW: 16.3 % — ABNORMAL HIGH (ref 11.5–15.5)
WBC: 5.3 10*3/uL (ref 4.0–10.5)
nRBC: 0 % (ref 0.0–0.2)

## 2020-06-27 LAB — GLUCOSE, CAPILLARY
Glucose-Capillary: 108 mg/dL — ABNORMAL HIGH (ref 70–99)
Glucose-Capillary: 100 mg/dL — ABNORMAL HIGH (ref 70–99)
Glucose-Capillary: 104 mg/dL — ABNORMAL HIGH (ref 70–99)
Glucose-Capillary: 120 mg/dL — ABNORMAL HIGH (ref 70–99)

## 2020-06-27 LAB — COOXEMETRY PANEL
Carboxyhemoglobin: 1.4 % (ref 0.5–1.5)
Methemoglobin: 1.4 % (ref 0.0–1.5)
O2 Saturation: 79.3 %
Total hemoglobin: 9.5 g/dL — ABNORMAL LOW (ref 12.0–16.0)

## 2020-06-27 MED ORDER — TORSEMIDE 20 MG PO TABS
40.0000 mg | ORAL_TABLET | Freq: Every morning | ORAL | Status: DC
  Administered 2020-06-28 – 2020-06-29 (×2): 40 mg via ORAL
  Filled 2020-06-27 (×2): qty 2

## 2020-06-27 MED ORDER — POTASSIUM CHLORIDE CRYS ER 20 MEQ PO TBCR
40.0000 meq | EXTENDED_RELEASE_TABLET | Freq: Every day | ORAL | Status: DC
  Administered 2020-06-27 – 2020-06-29 (×3): 40 meq via ORAL
  Filled 2020-06-27 (×3): qty 2

## 2020-06-27 MED ORDER — CAMPHOR-MENTHOL 0.5-0.5 % EX LOTN
TOPICAL_LOTION | CUTANEOUS | Status: DC | PRN
  Administered 2020-06-27: 1 via TOPICAL
  Filled 2020-06-27: qty 222

## 2020-06-27 MED ORDER — TORSEMIDE 20 MG PO TABS
20.0000 mg | ORAL_TABLET | Freq: Every evening | ORAL | Status: DC
  Administered 2020-06-27 – 2020-06-28 (×2): 20 mg via ORAL
  Filled 2020-06-27 (×4): qty 1

## 2020-06-27 NOTE — NC FL2 (Addendum)
[place] LEVEL OF CARE SCREENING TOOL     IDENTIFICATION  Patient Name: [name]. Birthdate: 27-Apr-1935 Sex: male Admission Date (Current Location): 06/24/2020  [facility] and [place] Number:  Herbalist and Address:  [facility] [place], [place] [address] [place], [place] [address]      Provider Number: [id number]  Attending Physician Name and Address:  Larey Dresser, MD  Relative Name and Phone Number:  [name] [phone]    Current Level of Care: Hospital Recommended Level of Care: [place] Prior Approval Number:    Date Approved/Denied:   PASRR Number: [id number]  Discharge Plan: SNF    Current Diagnoses: Patient Active Problem List   Diagnosis Date Noted  . Acute on chronic combined systolic and diastolic CHF (congestive heart failure) ([place]) 06/24/2020  . Acute renal failure (ARF) ([place]) 08/14/2019  . ARF (acute renal failure) ([place]) 08/14/2019  . CHF (congestive heart failure) ([place]) 05/22/2019  . Pleural effusion 10/13/2018  . Dyspnea 06/02/2018  . Ischemic cardiomyopathy 06/11/2017  . Cardiomyopathy- new drop in EF- ? secondary to new AF 06/25/2014  . Coronary artery disease involving coronary bypass graft of native heart with unstable angina pectoris ([place])   . Atrial fibrillation with rapid ventricular response ([place]) 06/24/2014  . UNSPECIFIED THROMBOCYTOPENIA 07/26/2008  . Pulmonary hypertension-PA 51 mmHg  07/26/2008  . Type 2 diabetes mellitus with renal manifestations, controlled ([place]) 07/22/2008  . Dyslipidemia 07/22/2008  . Essential hypertension 07/22/2008  . 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 PVD - moderate bilateral carotid disease 07/22/2008  . Chronic renal insufficiency, stage III (moderate) ([place]) 07/22/2008  . ANEMIA, HX OF 07/22/2008    Orientation RESPIRATION BLADDER Height & Weight     Self,Situation,Place  Normal Continent,External catheter (External  Urinary Catheter) Weight: 147 lb 9.6 oz (67 kg) Height:  '5\' 11"'$  (180.3 cm)  BEHAVIORAL SYMPTOMS/MOOD NEUROLOGICAL BOWEL NUTRITION STATUS      Continent Diet (See discharge summary)  AMBULATORY STATUS COMMUNICATION OF NEEDS Skin   Limited Assist Verbally Other (Comment) (Ecchymosis;arm,right,left,blister, leg left)                       Personal Care Assistance Level of Assistance  Bathing,Feeding,Dressing Bathing Assistance: Maximum assistance Feeding assistance: Limited assistance Dressing Assistance: Maximum assistance     Functional Limitations Info  Sight,Hearing,Speech Sight Info: Impaired Hearing Info: Impaired Speech Info: Adequate    SPECIAL CARE FACTORS FREQUENCY  PT (By licensed PT),OT (By licensed OT)     PT: Hospice Services with [facility] OT: Hospice Services with [facility]            Contractures Contractures Info: Not present    Additional Factors Info  Code Status,Insulin Sliding Scale Code Status Info: DNR     Insulin Sliding Scale Info: insulin aspart (novoLOG) injection 0-15 Units 3 times daily with meals,insulin aspart (novoLOG) injection 0-5 Units daily at bedtime       Current Medications (06/27/2020):  This is the current hospital active medication list Current Facility-Administered Medications  Medication Dose Route Frequency Provider Last Rate Last Admin  . 0.9 %  sodium chloride infusion  250 mL Intravenous PRN Larey Dresser, MD      . acetaminophen (TYLENOL) tablet 650 mg  650 mg Oral Q4H PRN Larey Dresser, MD   650 mg at 06/25/20 2237  . amiodarone (PACERONE) tablet 200 mg  200 mg Oral Daily [name]  S, MD   200 mg at 06/27/20 0908  . apixaban (ELIQUIS) tablet 2.5 mg  2.5 mg Oral BID Larey Dresser, MD   2.5 mg at 06/27/20 0908  . atorvastatin (LIPITOR) tablet 20 mg  20 mg Oral Daily Larey Dresser, MD   20 mg at 06/27/20 0908  . camphor-menthol (SARNA) lotion   Topical PRN Dellinger, Bobby Rumpf, PA-C   1  application at [id number]  . Chlorhexidine Gluconate Cloth 2 % PADS 6 each  6 each Topical Daily Larey Dresser, MD   6 each at 06/27/20 1300  . donepezil (ARICEPT) tablet 10 mg  10 mg Oral QHS Larey Dresser, MD   10 mg at 06/26/20 2116  . insulin aspart (novoLOG) injection 0-15 Units  0-15 Units Subcutaneous TID WC Larey Dresser, MD   3 Units at 06/26/20 1258  . insulin aspart (novoLOG) injection 0-5 Units  0-5 Units Subcutaneous QHS Larey Dresser, MD      . midodrine (PROAMATINE) tablet 10 mg  10 mg Oral TID WC Bensimhon, Shaune Pascal, MD   10 mg at 06/27/20 1646  . milrinone (PRIMACOR) 20 MG/100 ML (0.2 mg/mL) infusion  0.25 mcg/kg/min Intravenous Continuous Larey Dresser, MD 5.51 mL/hr at 06/27/20 0855 0.25 mcg/kg/min at 06/27/20 0855  . ondansetron (ZOFRAN) injection 4 mg  4 mg Intravenous Q6H PRN Larey Dresser, MD      . pantoprazole (PROTONIX) EC tablet 40 mg  40 mg Oral Daily Larey Dresser, MD   40 mg at 06/27/20 0908  . potassium chloride SA (KLOR-CON) CR tablet 40 mEq  40 mEq Oral Daily Larey Dresser, MD   40 mEq at 06/27/20 1614  . silver sulfADIAZINE (SILVADENE) 1 % cream 1 application  1 application Topical Daily PRN Larey Dresser, MD      . sodium chloride flush (NS) 0.9 % injection 10-40 mL  10-40 mL Intracatheter Q12H Larey Dresser, MD   10 mL at 06/26/20 2118  . sodium chloride flush (NS) 0.9 % injection 10-40 mL  10-40 mL Intracatheter PRN Larey Dresser, MD      . sodium chloride flush (NS) 0.9 % injection 3 mL  3 mL Intravenous Once Larey Dresser, MD      . sodium chloride flush (NS) 0.9 % injection 3 mL  3 mL Intravenous Q12H Larey Dresser, MD   3 mL at 06/27/20 0908  . sodium chloride flush (NS) 0.9 % injection 3 mL  3 mL Intravenous PRN Larey Dresser, MD      . torsemide [facility]) tablet 20 mg  20 mg Oral QPM Larey Dresser, MD   20 mg at 06/27/20 1646  . [START ON 06/28/2020] torsemide (DEMADEX) tablet 40 mg  40 mg Oral q morning Larey Dresser, MD         Discharge Medications: Please see discharge summary for a list of discharge medications.  Relevant Imaging Results:  Relevant Lab Results:   Additional Information [phone]  Trula Ore, LCSWA

## 2020-06-27 NOTE — Progress Notes (Signed)
This chaplain responded to PMT consult for spiritual care.  The Pt. is awake and sharing no pain at the time of the visit.  The Pt. wife-Elaine of 61 years is at the bedside rubbing his feet.  The chaplain observed the possibility of a little humor if the Pt. had more energy. The Pt. is expecting a visit from his son today.  The chaplain practiced reflective listening as the Pt. and [name] talked about their family and careers.  [name] highlights the successes of their two grand-children in the story telling.    [name] shares she is tired, but has not found the space to rest. The chaplain listens to Elaine's updates on her own health challenges associated with standing for periods of time. The chaplain understands community clergy is supporting the family.    [name] is anticipating a visit from the Hospice liaison and is curious about the navigation of Hospice care.  This chaplain will F/U with spiritual care as needed.

## 2020-06-27 NOTE — Progress Notes (Signed)
EPIC Encounter for ICM Monitoring  Patient Name: [name]. is a 85 y.o. male Date: 06/27/2020 Primary Care Physican: Shirline Frees, MD Primary Cardiologist:[place] Electrophysiologist:[place] 1/12/2022Weight:129lbs   Transmission reviewed.  Patient hospitalized 06/24/2020 for diuresis.  Per 4/4 hospital epic note by Dr Aundra Dubin, patient is at end stage HF and referring to hospice.  CorVue thoracic impedancenormal.  Prescribed:  Torsemide20 mg take 2 tablets (40 mg total) every morning and 20 mg every evening.  Labs: 05/20/2020 Creatinine 2.19, BUN 45, Potassium 4.1, Sodium 136, GFR 29 05/10/2020 Creatinine 1.95, BUN 42, Potassium 4.3, Sodium 138, GFR 34  03/01/2020 Creatinine 2.69, BUN 48, Potassium 3.5, Sodium 137 11/26/2019 Creatinine 2.98, BUN 70, Potassium 3.7, Sodium 135, GFR 19-22 10/27/2019 Creatinine2.84, BUN70, Potassium 3.5, Sodium137, GFR20-23 10/05/2019 Creatinine3.09, BUN62, Potassium 4.2, Sodium130, [id number]  09/03/2019 Creatinine3.32, BUN63, Potassium 4.0, [id number], [id number]  08/16/2019 Creatinine3.48, BUN71, Potassium 4.0, Sodium140, [id number]  08/15/2019 Creatinine4.57, BUN94, Potassium 4.1, Sodium138, [id number]  08/14/2019 Creatinine5.11, BUN105, Potassium4.1, Sodium133, GFR10-11  08/13/2019 Creatinine5.10, BUN111, Potassium3.9, Sodium136, GFR10-11 A complete set of results can be found in Results Review.  Recommendations: None  Follow-up plan: ICM clinic no further follow up due to patient being referred to hospice.  91 day device clinic remote transmission6/22/2022.   EP/Cardiology Office Visits: 09/07/2020 with Dr Aundra Dubin.   Copy of ICM check sent to [place]  3 month ICM trend: 06/24/2020.    1 Year ICM trend:       Rosalene Billings, RN 06/27/2020 12:43 PM

## 2020-06-27 NOTE — Progress Notes (Signed)
Patient ID: [name]., male   DOB: [birth year], 85 y.o.   MRN: [id number]     Advanced Heart Failure Rounding Note  PCP-Cardiologist: [name], MD   Subjective:    Milrinone 0.25 started 4/1, PICC placed.  This morning, co-ox 79%. CVP 8. Midodrine 10 tid added. SBP 90s-100s   I/Os negative with Lasix gtt.   Patient is profoundly weak but denies dyspnea.    Creatinine 3.4 => 3.66.     Objective:   Weight Range: 67 kg Body mass index is 20.59 kg/m.   Vital Signs:   Temp:  [98.5 F (36.9 C)] 98.5 F (36.9 C) (04/03 2357) Pulse Rate:  [48-96] 60 (04/04 0334) Resp:  [14-15] 15 (04/03 2357) BP: (96-109)/(47-63) 109/59 (04/04 0334) SpO2:  [90 %-96 %] 96 % (04/04 0334) Weight:  [66.6 kg-67 kg] 67 kg (04/04 0334) Last BM Date: 06/26/20  Weight change: Filed Weights   06/25/20 0623 06/26/20 1800 06/27/20 0334  Weight: 70.4 kg 66.6 kg 67 kg    Intake/Output:   Intake/Output Summary (Last 24 hours) at 06/27/2020 0801 Last data filed at 06/27/2020 0309 Gross per 24 hour  Intake 226.9 ml  Output 2250 ml  Net -2023.1 ml      Physical Exam    General: NAD, frail Neck: No JVD, no thyromegaly or thyroid nodule.  Lungs: Clear to auscultation bilaterally with normal respiratory effort. CV: Lateral PMI.  Heart regular S1/S2, no S3/S4, no murmur.  No peripheral edema.   Abdomen: Soft, nontender, no hepatosplenomegaly, no distention.  Skin: Intact without lesions or rashes.  Neurologic: Alert and oriented x 3.  Psych: Flat affect. Extremities: No clubbing or cyanosis.  HEENT: Normal.    Telemetry   NSR 50s-60s with PVCs and runs of ?AIVR vs junctional with intermittent RBBB (personally reviewed)  Labs    CBC Recent Labs    06/24/20 1316 06/24/20 1318 06/26/20 0500 06/27/20 0438  WBC 4.9   < > 3.9* 5.3  NEUTROABS 4.0  --   --   --   HGB 10.5*   < > 8.9* 9.3*  HCT 32.9*   < > 26.7* 28.5*  MCV 91.9   < > 89.6 88.2  PLT 116*   < > 105* 102*   <  > = values in this interval not displayed.   Basic Metabolic Panel Recent Labs    06/24/20 1743 06/25/20 0500 06/26/20 0500 06/27/20 0438  NA  --    < > 140 138  K  --    < > 4.1 3.6  CL  --    < > 96* 92*  CO2  --    < > 33* 35*  GLUCOSE  --    < > 120* 129*  BUN  --    < > 69* 72*  CREATININE  --    < > 3.45* 3.66*  CALCIUM  --    < > 8.9 9.0  MG 2.6*  --   --   --    < > = values in this interval not displayed.   Liver Function Tests Recent Labs    06/24/20 1316 06/24/20 1318  AST 43* 41  ALT 27 28  ALKPHOS 114 112  BILITOT 1.1 1.0  PROT 6.4* 6.5  ALBUMIN 3.2* 3.3*   No results for input(s): LIPASE, AMYLASE in the last 72 hours. Cardiac Enzymes No results for input(s): CKTOTAL, CKMB, CKMBINDEX, TROPONINI in the last 72 hours.  BNP: BNP (last  3 results) Recent Labs    11/26/19 1535 05/10/20 1217 06/24/20 1316  BNP 1,752.4* 1,821.5* 1,353.5*    ProBNP (last 3 results) No results for input(s): PROBNP in the last 8760 hours.   D-Dimer No results for input(s): DDIMER in the last 72 hours. Hemoglobin A1C Recent Labs    06/24/20 1857  HGBA1C 7.5*   Fasting Lipid Panel No results for input(s): CHOL, HDL, LDLCALC, TRIG, CHOLHDL, LDLDIRECT in the last 72 hours. Thyroid Function Tests Recent Labs    06/24/20 1426 06/24/20 1743  TSH 10.242*  --   T3FREE  --  1.0*    Other results:   Imaging    No results found.   Medications:     Scheduled Medications: . amiodarone  200 mg Oral Daily  . apixaban  2.5 mg Oral BID  . atorvastatin  20 mg Oral Daily  . Chlorhexidine Gluconate Cloth  6 each Topical Daily  . donepezil  10 mg Oral QHS  . insulin aspart  0-15 Units Subcutaneous TID WC  . insulin aspart  0-5 Units Subcutaneous QHS  . midodrine  10 mg Oral TID WC  . pantoprazole  40 mg Oral Daily  . sodium chloride flush  10-40 mL Intracatheter Q12H  . sodium chloride flush  3 mL Intravenous Once  . sodium chloride flush  3 mL Intravenous Q12H   . torsemide  20 mg Oral QPM  . [START ON 06/28/2020] torsemide  40 mg Oral q morning    Infusions: . sodium chloride    . milrinone 0.25 mcg/kg/min (06/27/20 0309)    PRN Medications: sodium chloride, acetaminophen, ondansetron (ZOFRAN) IV, silver sulfADIAZINE, sodium chloride flush, sodium chloride flush   Assessment/Plan   1.Acute on chronic systolic CHF: Ischemic cardiomyopathy. Echo in 8/20 with EF 20-25%, moderately decreased RV systolic function. [employer] ICD. CHF has been complicated by CKD stage 4. RHC in 2/21 showed low but not markedly low cardiac output, he was admitted for diuresis with milrinone. Milrinone was weaned off. Echo in 2/22showed EF 25-30% with global hypokinesis with apical akinesis, mildly decreased RV systolic function, PASP 56, moderate MR, IVC dilated. He has become markedly more symptomatic over the last week prior to admission. He was initially volume overloaded with weight up 12 lbs in a week and REDS clip markedly high at 57%. PICC placed, milrinone 0.25 started.  This morning, co-ox 79% with CVP 8.  Creatinine mildly higher at 3.66.  -Continue milrinone 0.25 mcg/kg/min.  - Continue midodrine 10 mg tid with soft BP.  - Stop Lasix gtt, restart torsemide 40 qam/20 qpm starting this afternoon.  -Wil stop Verquvo with hypotension - Hold Jardiance for now. - He will stay off Entresto and spironolactone with elevated creatinine.  - He is off beta blocker with bradycardia and suspected low output  - Narrow QRS,has not beencandidate for upgrade to CRT.  - PYP scan in 5/21 not suggestive of transthyretin amyloidosis.  - Continue Unna boots.  - Not candidate for advanced therapies. Suspect he is at end stage with progressive CHF and renal failure.  2. CAD: S/p CABG 2007, patent grafts on LHC in 2016. No s/s angina.  - Given stable CAD and Eliquis use, he is not on ASA.  - Continue statin.  3. Atrial fibrillation: Paroxysmal. He remains in NSR today.  Seems to have periods of junctional rhythm -Back on amio.   - Watch rhythm closely with periods of junctional rhythm. Low threshold to stop.   - He  is on Eliquis 2.5 mg bid. 4. Right chronic pleural effusion: small on CXR.  5. AKI on CKD stage 4: Sees nephrology as outpatient. SCr up from prior baseline of 2.5>>3.4 at admission . Suspect low output/ cardiorenal.  Creatinine mildly higher at 3.66.   - not HD candidate 6. Carotid stenosis: Repeat dopplers in 10/22.  7.Moderate OSA: Does not have CPAP yet.  8. Neuro: Baseline mild dementia.  Slurred speech. ?CVA versus encephalopathy.  Head CT negative. Ammonia level ok at 25. No fever.  - exam stable today.  9. Thrombocytopenia: Mild, follow closely.    He seems to be clearly end-stage with inotrope dependent HF, progressive renal failure, dementia, hypotension and profound weakness. Palliative care following.  Discussed hospice options with wife today.  They hope to have his daughter come in Thursday to see him.  To facilitate this, I wonder if we could continue milrinone for now as a palliative measure with hospice at least until his daughter gets here. Await palliative care followup today.   Length of Stay: 3  [name], MD  06/27/2020, 8:01 AM  Advanced Heart Failure Team Pager [phone] (M-F; 7a - 5p)  Please contact [facility] for night-coverage after hours (5p -7a ) and weekends on [email or web address]

## 2020-06-27 NOTE — Progress Notes (Signed)
PT Cancellation Note  Patient Details Name: [name]. MRN: [id number] DOB: 1935/06/13   Cancelled Treatment:    Reason Eval/Treat Not Completed: Other (comment) Per nurse, hold PT at this time as pt has 3 family members visiting. Will follow.   Marguarite Arbour A Ziggy Reveles 06/27/2020, 2:41 PM Marisa Severin, PT, DPT Acute Rehabilitation Services Pager [phone] Office [phone]

## 2020-06-27 NOTE — TOC Initial Note (Signed)
Transition of Care (TOC) - Initial/Assessment Note    Patient Details  Name: [name]. MRN: [id number] Date of Birth: May 15, 1935  Transition of Care [facility]) CM/SW Contact:    Trula Ore, [place] Phone Number: 06/27/2020, 5:02 PM  Clinical Narrative:                  CSW received consult for possible SNF placement at time of discharge. CSW spoke with patients spouse [name] regarding PT recommendation of SNF placement at time of discharge. [facility].Patients spouse expressed understanding of PT recommendation and is agreeable to SNF placement at time of discharge with hospice to follow. Patients spouse reports preference [facility] .  Patient has received the COVID vaccines as well as booster.  No further questions reported at this time. CSW to continue to follow and assist with discharge planning needs.  Expected Discharge Plan: Skilled Nursing Facility Barriers to Discharge: Continued Medical Work up   Patient Goals and CMS Choice   CMS Medicare.gov Compare Post Acute Care list provided to:: Patient Represenative (must comment) (spouse [name]) Choice offered to / list presented to : Spouse [name])  Expected Discharge Plan and Services Expected Discharge Plan: [place] In-house Referral: Clinical Social Work     Living arrangements for the past 2 months[facility] [place] [facility]                                      Prior Living Arrangements/Services Living arrangements for the past 2 months: Materials engineer (From [place]) Lives with:: Self,Spouse Patient language and need for interpreter reviewed:: Yes Do you feel safe going back to the place where you live?: No   SNF  Need for Family Participation in Patient Care: Yes (Comment) Care giver support system in place?: Yes (comment)   Criminal Activity/Legal Involvement  Pertinent to Current Situation/Hospitalization: No - Comment as needed  Activities of Daily Living Home Assistive Devices/Equipment: Engineer, drilling (specify type) (rollator) ADL Screening (condition at time of admission) Patient's cognitive ability adequate to safely complete daily activities?: Yes Is the patient deaf or have difficulty hearing?: Yes Does the patient have difficulty seeing, even when wearing glasses/contacts?: No Does the patient have difficulty concentrating, remembering, or making decisions?: No Patient able to express need for assistance with ADLs?: Yes Does the patient have difficulty dressing or bathing?: No Independently performs ADLs?: Yes (appropriate for developmental age) Does the patient have difficulty walking or climbing stairs?: Yes Weakness of Legs: None Weakness of Arms/Hands: None  Permission Sought/Granted Permission sought to share information with : Case Manager,Family Chief Financial Officer Permission granted to share information with : Yes, Verbal Permission Granted  Share Information with NAME: [name]  Permission granted to share info w AGENCY: SNF  Permission granted to share info w Relationship: spouse  Permission granted to share info w Contact Information: [name] [phone]  Emotional Assessment       Orientation: : Oriented to Self,Oriented to Place,Oriented to Situation Alcohol / Substance Use: Not Applicable Psych Involvement: No (comment)  Admission diagnosis:  CHF (congestive heart failure) ([name]) [I50.9] General weakness [R53.1] Pleural effusion, right [J90] Symptomatic bradycardia [R00.1] Acute renal failure, unspecified acute renal failure type [facility]) [N17.9] Patient Active Problem List   Diagnosis Date Noted  . Acute on chronic combined systolic and diastolic CHF (congestive heart failure) (  [place]) 06/24/2020  . Acute renal failure (ARF) ([place]) 08/14/2019  . ARF (acute renal failure) ([place]) 08/14/2019  .  CHF (congestive heart failure) ([place]) 05/22/2019  . Pleural effusion 10/13/2018  . Dyspnea 06/02/2018  . Ischemic cardiomyopathy 06/11/2017  . Cardiomyopathy- new drop in EF- ? secondary to new AF 06/25/2014  . Coronary artery disease involving coronary bypass graft of native heart with unstable angina pectoris ([place])   . Atrial fibrillation with rapid ventricular response ([place]) 06/24/2014  . UNSPECIFIED THROMBOCYTOPENIA 07/26/2008  . Pulmonary hypertension-PA 51 mmHg  07/26/2008  . Type 2 diabetes mellitus with renal manifestations, controlled ([place]) 07/22/2008  . Dyslipidemia 07/22/2008  . Essential hypertension 07/22/2008  . 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 PVD - moderate bilateral carotid disease 07/22/2008  . Chronic renal insufficiency, stage III (moderate) ([place]) 07/22/2008  . ANEMIA, HX OF 07/22/2008   PCP:  Shirline Frees, MD Pharmacy:   [id number]: [phone]: [phone]    Social Determinants of Health (SDOH) Interventions    Readmission Risk Interventions No flowsheet data found.

## 2020-06-27 NOTE — Progress Notes (Signed)
PT Cancellation Note  Patient Details Name: [name]. MRN: [id number] DOB: 01-23-1936   Cancelled Treatment:    Reason Eval/Treat Not Completed: Other (comment).  Pt is too weak per wife, who is expecting per her report that he is declining.  Updated recommendation for SNF care as pt is not able to walk today.  Follow as time and pt allow for further mobility including ROM to instruct family.   Ramond Dial 06/27/2020, 4:14 PM   Mee Hives, PT MS Acute Rehab Dept. Number: [place] and [place]

## 2020-06-27 NOTE — Plan of Care (Signed)

## 2020-06-28 DIAGNOSIS — R531 Weakness: Secondary | ICD-10-CM | POA: Diagnosis not present

## 2020-06-28 DIAGNOSIS — N179 Acute kidney failure, unspecified: Secondary | ICD-10-CM | POA: Diagnosis not present

## 2020-06-28 DIAGNOSIS — Z7189 Other specified counseling: Secondary | ICD-10-CM | POA: Diagnosis not present

## 2020-06-28 DIAGNOSIS — I255 Ischemic cardiomyopathy: Secondary | ICD-10-CM | POA: Diagnosis not present

## 2020-06-28 DIAGNOSIS — J90 Pleural effusion, not elsewhere classified: Secondary | ICD-10-CM

## 2020-06-28 DIAGNOSIS — I5043 Acute on chronic combined systolic (congestive) and diastolic (congestive) heart failure: Secondary | ICD-10-CM | POA: Diagnosis not present

## 2020-06-28 LAB — CBC
HCT: 30.1 % — ABNORMAL LOW (ref 39.0–52.0)
Hemoglobin: 9.7 g/dL — ABNORMAL LOW (ref 13.0–17.0)
MCH: 28.9 pg (ref 26.0–34.0)
MCHC: 32.2 g/dL (ref 30.0–36.0)
MCV: 89.6 fL (ref 80.0–100.0)
Platelets: 101 10*3/uL — ABNORMAL LOW (ref 150–400)
RBC: 3.36 MIL/uL — ABNORMAL LOW (ref 4.22–5.81)
RDW: 16.3 % — ABNORMAL HIGH (ref 11.5–15.5)
WBC: 5.6 10*3/uL (ref 4.0–10.5)
nRBC: 0 % (ref 0.0–0.2)

## 2020-06-28 LAB — GLUCOSE, CAPILLARY
Glucose-Capillary: 99 mg/dL (ref 70–99)
Glucose-Capillary: 151 mg/dL — ABNORMAL HIGH (ref 70–99)
Glucose-Capillary: 82 mg/dL (ref 70–99)
Glucose-Capillary: 125 mg/dL — ABNORMAL HIGH (ref 70–99)

## 2020-06-28 LAB — BASIC METABOLIC PANEL
Anion gap: 12 (ref 5–15)
BUN: 69 mg/dL — ABNORMAL HIGH (ref 8–23)
CO2: 36 mmol/L — ABNORMAL HIGH (ref 22–32)
Calcium: 9.2 mg/dL (ref 8.9–10.3)
Chloride: 93 mmol/L — ABNORMAL LOW (ref 98–111)
Creatinine, Ser: 3.7 mg/dL — ABNORMAL HIGH (ref 0.61–1.24)
GFR, Estimated: 16 mL/min — ABNORMAL LOW (ref >60)
Glucose, Bld: 111 mg/dL — ABNORMAL HIGH (ref 70–99)
Potassium: 3.9 mmol/L (ref 3.5–5.1)
Sodium: 141 mmol/L (ref 135–145)

## 2020-06-28 LAB — COOXEMETRY PANEL
Carboxyhemoglobin: 1.6 % — ABNORMAL HIGH (ref 0.5–1.5)
Methemoglobin: 1.2 % (ref 0.0–1.5)
O2 Saturation: 75.7 %
Total hemoglobin: 9.7 g/dL — ABNORMAL LOW (ref 12.0–16.0)

## 2020-06-28 LAB — SARS CORONAVIRUS 2 (TAT 6-24 HRS): SARS Coronavirus 2: NEGATIVE

## 2020-06-28 MED ORDER — MIDODRINE HCL 5 MG PO TABS
15.0000 mg | ORAL_TABLET | Freq: Three times a day (TID) | ORAL | Status: DC
  Administered 2020-06-28 – 2020-06-29 (×3): 15 mg via ORAL
  Filled 2020-06-28 (×3): qty 3

## 2020-06-28 NOTE — Progress Notes (Signed)
Occupational Therapy Treatment Patient Details Name: [name]. MRN: [id number] DOB: Feb 24, 1936 Today's Date: 06/28/2020    History of present illness 85 y/o presented to ED on 4/1 after recommended by cardiology for concerns of general weakness, confusion, slurred speech, increased swelling and weight gain. Patient found to be in volume overloaded due to acute on chronic systolic heart failure. PMH: CAD, CHF, HTN, HLD, Pulmonary HTN, renal insufficiency, DM type 2, cardiomyopathy.   OT comments  Patient continues to make steady progress towards goals in skilled OT session. Patient's session encompassed functional mobility in order to increase overall activity tolerance to complete ADLs and IADLs. Pt minimally HOH in session, as well as requiring min cues to follow commands (suspect due to fatigue). Pt able to participate in functional mobility with VSS (BP 107/60 sitting EOB) but requires mod cues to correct posture, as pt prefers to walk with kyphotic posture and neck flexed. Pt sitting in recliner at end of session with all needs met. Therapy will continue to follow.    Follow Up Recommendations  Home health OT    Equipment Recommendations  None recommended by OT    Recommendations for Other Services      Precautions / Restrictions Precautions Precautions: Fall Restrictions Weight Bearing Restrictions: No       Mobility Bed Mobility Overal bed mobility: Needs Assistance Bed Mobility: Supine to Sit     Supine to sit: Min assist     General bed mobility comments: min A due to multiple lines and leads    Transfers Overall transfer level: Needs assistance Equipment used: Rolling walker (2 wheeled) Transfers: Sit to/from Stand Sit to Stand: Min guard;Min assist         General transfer comment: bed elevated to come to standing, min A for safety, cues for hand placement    Balance Overall balance assessment: Needs assistance Sitting-balance support: Feet  supported;No upper extremity supported Sitting balance-Leahy Scale: Good     Standing balance support: During functional activity Standing balance-Leahy Scale: Poor Standing balance comment: reliant on UE support                           ADL either performed or assessed with clinical judgement   ADL Overall ADL's : Needs assistance/impaired                     Lower Body Dressing: Total assistance Lower Body Dressing Details (indicate cue type and reason): sitting EOB, unable to engage in figure 4 Toilet Transfer: Minimal assistance;RW Toilet Transfer Details (indicate cue type and reason): simulated with functional mobility to recliner         Functional mobility during ADLs: Rolling walker;Minimal assistance General ADL Comments: continues to progress with functional mobility, increased fatigue in session, with kyphotic posture when ambulating requiring cues to correct     Vision       Perception     Praxis      Cognition Arousal/Alertness: Lethargic Behavior During Therapy: Flat affect Overall Cognitive Status: Within Functional Limits for tasks assessed                                 General Comments: for basic mobility tasks; follows commands well. Conversing appropriately. Minimally [facility]        Exercises     Shoulder Instructions       General Comments  Family and wife present during session.    Pertinent Vitals/ Pain       Pain Assessment: Faces Faces Pain Scale: No hurt Pain Intervention(s): Limited activity within patient's tolerance;Monitored during session;Repositioned  Home Living                                          Prior Functioning/Environment              Frequency  Min 2X/week        Progress Toward Goals  OT Goals(current goals can now be found in the care plan section)  Progress towards OT goals: Progressing toward goals  Acute Rehab OT Goals Patient Stated Goal:  keep moving OT Goal Formulation: With patient Time For Goal Achievement: 07/09/20 Potential to Achieve Goals: [place] Discharge plan remains appropriate    Co-evaluation                 AM-PAC OT "6 Clicks" Daily Activity     Outcome Measure   Help from another person eating meals?: None Help from another person taking care of personal grooming?: None Help from another person toileting, which includes using toliet, bedpan, or urinal?: A Little Help from another person bathing (including washing, rinsing, drying)?: A Lot Help from another person to put on and taking off regular upper body clothing?: A Little Help from another person to put on and taking off regular lower body clothing?: A Lot 6 Click Score: 18    End of Session Equipment Utilized During Treatment: Rolling walker;Gait belt  OT Visit Diagnosis: Unsteadiness on feet (R26.81);Muscle weakness (generalized) (M62.81);Pain   Activity Tolerance Patient tolerated treatment well;Patient limited by fatigue;Patient limited by lethargy   Patient Left in chair;with call bell/phone within reach;with family/visitor present   Nurse Communication Mobility status        Time: 6725-5001 OT Time Calculation (min): 34 min  Charges: OT General Charges $OT Visit: 1 Visit OT Treatments $Self Care/Home Management : 23-37 mins  [place]. Nakia Remmers, COTA/L Acute Rehabilitation Services [phone] [place] 06/28/2020, 4:14 PM

## 2020-06-28 NOTE — Progress Notes (Signed)
Physical Therapy Treatment Patient Details Name: [name]. MRN: [id number] DOB: [birth year] Today's Date: 06/28/2020    History of Present Illness 85 y/o presented to ED on 4/1 after recommended by cardiology for concerns of general weakness, confusion, slurred speech, increased swelling and weight gain. Patient found to be in volume overloaded due to acute on chronic systolic heart failure. PMH: CAD, CHF, HTN, HLD, Pulmonary HTN, renal insufficiency, DM type 2, cardiomyopathy.    PT Comments    Patient progressing well towards PT goals. Agreeable to mobilizing this afternoon; reports he has been sitting in chair since this morning and wants to continue sitting up post session. Listening to music and visiting with family. Improved ambulation distance with Min A and use of RW for support. Pt with generalized weakness and balance deficits. Plans to d/c to [place] [facility] with hospice. Will follow.     Follow Up Recommendations  SNF;Other (comment) (with hospice)     Equipment Recommendations  None recommended by PT    Recommendations for Other Services       Precautions / Restrictions Precautions Precautions: Fall Restrictions Weight Bearing Restrictions: No    Mobility  Bed Mobility               General bed mobility comments: Sitting in chair upon PT arrival.    Transfers Overall transfer level: Needs assistance Equipment used: Rolling walker (2 wheeled) Transfers: Sit to/from Stand Sit to Stand: Min guard;Min assist         General transfer comment: Light Min A to power to standing from chair x1.  Ambulation/Gait Ambulation/Gait assistance: Min assist Gait Distance (Feet): 75 Feet Assistive device: Rolling walker (2 wheeled) Gait Pattern/deviations: Step-through pattern;Decreased stride length;Trunk flexed;Narrow base of support;Decreased step length - right;Decreased step length - left Gait velocity: decreased   General Gait Details: Slow, mildly  unsteady gait with flexed hips/knees and bil knee instability and imbalance esp with turns needing min A for support. Cues for upright.   Stairs             Wheelchair Mobility    Modified Rankin (Stroke Patients Only)       Balance Overall balance assessment: Needs assistance Sitting-balance support: Feet supported;No upper extremity supported Sitting balance-Leahy Scale: Good     Standing balance support: During functional activity Standing balance-Leahy Scale: Poor Standing balance comment: reliant on UE support                            Cognition Arousal/Alertness: Lethargic Behavior During Therapy: Flat affect Overall Cognitive Status: Within Functional Limits for tasks assessed                                 General Comments: for basic mobility tasks; follows commands well. Conversing appropriately.      Exercises      General Comments General comments (skin integrity, edema, etc.): Family and wife present during session.      Pertinent Vitals/Pain Pain Assessment: Faces Faces Pain Scale: No hurt    Home Living                      Prior Function            PT Goals (current goals can now be found in the care plan section) Progress towards PT goals: Progressing toward goals  Frequency    Min 3X/week      PT Plan Current plan remains appropriate    Co-evaluation              AM-PAC PT "6 Clicks" Mobility   Outcome Measure  Help needed turning from your back to your side while in a flat bed without using bedrails?: A Little Help needed moving from lying on your back to sitting on the side of a flat bed without using bedrails?: A Little Help needed moving to and from a bed to a chair (including a wheelchair)?: A Little Help needed standing up from a chair using your arms (e.g., wheelchair or bedside chair)?: A Little Help needed to walk in hospital room?: A Little Help needed climbing 3-5  steps with a railing? : A Lot 6 Click Score: 17    End of Session Equipment Utilized During Treatment: Gait belt Activity Tolerance: Patient tolerated treatment well Patient left: in chair;with call bell/phone within reach;with family/visitor present;Other (comment) (MD prsent) Nurse Communication: Mobility status PT Visit Diagnosis: Unsteadiness on feet (R26.81);Muscle weakness (generalized) (M62.81);Other abnormalities of gait and mobility (R26.89)     Time: [id number] PT Time Calculation (min) (ACUTE ONLY): 21 min  Charges:  $Gait Training: 8-22 mins                     Marisa Severin, PT, DPT Acute Rehabilitation Services Pager [phone] Office [place] 06/28/2020, 3:17 PM

## 2020-06-28 NOTE — Plan of Care (Signed)
  Problem: Education: Goal: Ability to demonstrate management of disease process will improve 06/28/2020 0732 by Camillia Herter, RN Outcome: Progressing 06/28/2020 0731 by Camillia Herter, RN Outcome: Progressing Goal: Ability to verbalize understanding of medication therapies will improve 06/28/2020 0732 by Camillia Herter, RN Outcome: Progressing 06/28/2020 0731 by Camillia Herter, RN Outcome: Progressing Goal: Individualized Educational Video(s) 06/28/2020 0732 by Camillia Herter, RN Outcome: Progressing 06/28/2020 0731 by Camillia Herter, RN Outcome: Progressing   Problem: Activity: Goal: Capacity to carry out activities will improve 06/28/2020 0732 by Camillia Herter, RN Outcome: Progressing 06/28/2020 0731 by Camillia Herter, RN Outcome: Progressing   Problem: Cardiac: Goal: Ability to achieve and maintain adequate cardiopulmonary perfusion will improve 06/28/2020 0732 by Camillia Herter, RN Outcome: Progressing 06/28/2020 0731 by Camillia Herter, RN Outcome: Progressing   Problem: Education: Goal: Knowledge of General Education information will improve Description: Including pain rating scale, medication(s)/side effects and non-pharmacologic comfort measures 06/28/2020 0732 by Camillia Herter, RN Outcome: Progressing 06/28/2020 0731 by Camillia Herter, RN Outcome: Progressing   Problem: Health Behavior/Discharge Planning: Goal: Ability to manage health-related needs will improve 06/28/2020 0732 by Camillia Herter, RN Outcome: Progressing 06/28/2020 0731 by Camillia Herter, RN Outcome: Progressing   Problem: Clinical Measurements: Goal: Ability to maintain clinical measurements within normal limits will improve 06/28/2020 0732 by Camillia Herter, RN Outcome: Progressing 06/28/2020 0731 by Camillia Herter, RN Outcome: Progressing Goal: Will remain free from infection 06/28/2020 0732 by Camillia Herter, RN Outcome: Progressing 06/28/2020 0731 by Camillia Herter, RN Outcome: Progressing Goal: Diagnostic test  results will improve 06/28/2020 0732 by Camillia Herter, RN Outcome: Progressing 06/28/2020 0731 by Camillia Herter, RN Outcome: Progressing Goal: Respiratory complications will improve 06/28/2020 0732 by Camillia Herter, RN Outcome: Progressing 06/28/2020 0731 by Camillia Herter, RN Outcome: Progressing Goal: Cardiovascular complication will be avoided 06/28/2020 0732 by Camillia Herter, RN Outcome: Progressing 06/28/2020 0731 by Camillia Herter, RN Outcome: Progressing   Problem: Activity: Goal: Risk for activity intolerance will decrease 06/28/2020 0732 by Camillia Herter, RN Outcome: Progressing 06/28/2020 0731 by Camillia Herter, RN Outcome: Progressing   Problem: Nutrition: Goal: Adequate nutrition will be maintained 06/28/2020 0732 by Camillia Herter, RN Outcome: Progressing 06/28/2020 0731 by Camillia Herter, RN Outcome: Progressing   Problem: Coping: Goal: Level of anxiety will decrease 06/28/2020 0732 by Camillia Herter, RN Outcome: Progressing 06/28/2020 0731 by Camillia Herter, RN Outcome: Progressing   Problem: Elimination: Goal: Will not experience complications related to bowel motility 06/28/2020 0732 by Camillia Herter, RN Outcome: Progressing 06/28/2020 0731 by Camillia Herter, RN Outcome: Progressing Goal: Will not experience complications related to urinary retention 06/28/2020 0732 by Camillia Herter, RN Outcome: Progressing 06/28/2020 0731 by Camillia Herter, RN Outcome: Progressing   Problem: Pain Managment: Goal: General experience of comfort will improve 06/28/2020 0732 by Camillia Herter, RN Outcome: Progressing 06/28/2020 0731 by Camillia Herter, RN Outcome: Progressing   Problem: Safety: Goal: Ability to remain free from injury will improve 06/28/2020 0732 by Camillia Herter, RN Outcome: Progressing 06/28/2020 0731 by Camillia Herter, RN Outcome: Progressing   Problem: Skin Integrity: Goal: Risk for impaired skin integrity will decrease 06/28/2020 0732 by Camillia Herter, RN Outcome:  Progressing 06/28/2020 0731 by Camillia Herter, RN Outcome: Progressing

## 2020-06-28 NOTE — Progress Notes (Addendum)
Patient ID: [name]., male   DOB: May 20, 1935, 85 y.o.   MRN: [id number]     Advanced Heart Failure Rounding Note  PCP-Cardiologist: [name], MD   Subjective:    Milrinone 0.25 started 4/1, PICC placed.  This morning, co-ox 76%. CVP 5-6. Midodrine 10 tid added. SBP 90s-110s   Diuresed well on lasix gtt, now back on torsemide. Wt up 1 lb.   Creatinine 3.4 => 3.66 => 3.70.    Feels ok today. No dyspnea. Working w/ PT. Wife and grandson at bedside. His daughter his expected to arrive from out of town today.    Objective:   Weight Range: 67 kg Body mass index is 20.59 kg/m.   Vital Signs:   Temp:  [98.5 F (36.9 C)] 98.5 F (36.9 C) (04/05 0814) Pulse Rate:  [65-70] 67 (04/05 0814) Resp:  [14-16] 16 (04/05 0814) BP: (93-118)/(40-60) 98/51 (04/05 0814) SpO2:  [93 %-96 %] 93 % (04/05 0814) Last BM Date: 06/27/20  Weight change: Filed Weights   06/25/20 0623 06/26/20 1800 06/27/20 0334  Weight: 70.4 kg 66.6 kg 67 kg    Intake/Output:   Intake/Output Summary (Last 24 hours) at 06/28/2020 0925 Last data filed at 06/28/2020 0601 Gross per 24 hour  Intake 397.84 ml  Output 5000 ml  Net -4602.16 ml      Physical Exam    PHYSICAL EXAM: CVP 5-6 General:  Elderly frail appearing WM sitting up on side of bed. No respiratory difficulty HEENT: normal Neck: supple.  JVD 7 cm. Carotids 2+ bilat; no bruits. No lymphadenopathy or thyromegaly appreciated. Cor: PMI nondisplaced. Regular rate & rhythm. No rubs, gallops or murmurs. Lungs: clear Abdomen: soft, nontender, nondistended. No hepatosplenomegaly. No bruits or masses. Good bowel sounds. Extremities: no cyanosis, clubbing, rash, edema Neuro: alert & oriented x 3, cranial nerves grossly intact. moves all 4 extremities w/o difficulty. Affect pleasant.   Telemetry   NSR 60s, 5 beats NSVT (personally reviewed)  Labs    CBC Recent Labs    06/27/20 0438 06/28/20 0452  WBC 5.3 5.6  HGB 9.3* 9.7*   HCT 28.5* 30.1*  MCV 88.2 89.6  PLT 102* [id number]*   Basic Metabolic Panel Recent Labs    06/27/20 0438 06/28/20 0452  NA 138 141  K 3.6 3.9  CL 92* 93*  CO2 35* 36*  GLUCOSE 129* 111*  BUN 72* 69*  CREATININE 3.66* 3.70*  CALCIUM 9.0 9.2   Liver Function Tests No results for input(s): AST, ALT, ALKPHOS, BILITOT, PROT, ALBUMIN in the last 72 hours. No results for input(s): LIPASE, AMYLASE in the last 72 hours. Cardiac Enzymes No results for input(s): CKTOTAL, CKMB, CKMBINDEX, TROPONINI in the last 72 hours.  BNP: BNP (last 3 results) Recent Labs    11/26/19 1535 05/10/20 1217 06/24/20 1316  BNP 1,752.4* 1,821.5* 1,353.5*    ProBNP (last 3 results) No results for input(s): PROBNP in the last 8760 hours.   D-Dimer No results for input(s): DDIMER in the last 72 hours. Hemoglobin A1C No results for input(s): HGBA1C in the last 72 hours. Fasting Lipid Panel No results for input(s): CHOL, HDL, LDLCALC, TRIG, CHOLHDL, LDLDIRECT in the last 72 hours. Thyroid Function Tests No results for input(s): TSH, T4TOTAL, T3FREE, THYROIDAB in the last 72 hours.  Invalid input(s): FREET3  Other results:   Imaging    No results found.   Medications:     Scheduled Medications: . amiodarone  200 mg Oral Daily  . apixaban  2.5 mg Oral BID  . atorvastatin  20 mg Oral Daily  . Chlorhexidine Gluconate Cloth  6 each Topical Daily  . donepezil  10 mg Oral QHS  . insulin aspart  0-15 Units Subcutaneous TID WC  . insulin aspart  0-5 Units Subcutaneous QHS  . midodrine  10 mg Oral TID WC  . pantoprazole  40 mg Oral Daily  . potassium chloride  40 mEq Oral Daily  . sodium chloride flush  10-40 mL Intracatheter Q12H  . sodium chloride flush  3 mL Intravenous Once  . sodium chloride flush  3 mL Intravenous Q12H  . torsemide  20 mg Oral QPM  . torsemide  40 mg Oral q morning    Infusions: . sodium chloride    . milrinone 0.25 mcg/kg/min (06/28/20 0138)    PRN  Medications: sodium chloride, acetaminophen, camphor-menthol, ondansetron (ZOFRAN) IV, silver sulfADIAZINE, sodium chloride flush, sodium chloride flush   Assessment/Plan   1.Acute on chronic systolic CHF: Ischemic cardiomyopathy. Echo in 8/20 with EF 20-25%, moderately decreased RV systolic function. [employer] ICD. CHF has been complicated by CKD stage 4. RHC in 2/21 showed low but not markedly low cardiac output, he was admitted for diuresis with milrinone. Milrinone was weaned off. Echo in 2/22showed EF 25-30% with global hypokinesis with apical akinesis, mildly decreased RV systolic function, PASP 56, moderate MR, IVC dilated. He has become markedly more symptomatic over the last week prior to admission. He was initially volume overloaded with weight up 12 lbs in a week and REDS clip markedly high at 57%. PICC placed, milrinone 0.25 started.  This morning, co-ox 76% with CVP 5-6.  Creatinine mildly higher but stable at 3.7.  -Continue milrinone 0.25 mcg/kg/min.  - Continue midodrine 10 mg tid with soft BP.  - Continue torsemide 40 qam/20 qpm  -Now off Verquvo with hypotension - Hold Jardiance for now. - He will stay off Entresto and spironolactone with elevated creatinine.  - He is off beta blocker with bradycardia and suspected low output  - Narrow QRS,has not beencandidate for upgrade to CRT.  - PYP scan in 5/21 not suggestive of transthyretin amyloidosis.  - Not candidate for advanced therapies. Suspect he is at end stage with progressive CHF and renal failure.  2. CAD: S/p CABG 2007, patent grafts on LHC in 2016. No s/s angina.  - Given stable CAD and Eliquis use, he is not on ASA.  - Continue statin.  3. Atrial fibrillation: Paroxysmal. He remains in NSR today. Seems to have periods of junctional rhythm -Back on amio.   - Watch rhythm closely with periods of junctional rhythm. Low threshold to stop.   - He is on Eliquis 2.5 mg bid. 4. Right chronic pleural effusion:  small on CXR.  5. AKI on CKD stage 4: Sees nephrology as outpatient. SCr up from prior baseline of 2.5>>3.4 at admission . Suspect low output/ cardiorenal.  Creatinine mildly higher but stable at 3.7.   - not HD candidate 6. Carotid stenosis: Repeat dopplers in 10/22.  7.Moderate OSA: Does not have CPAP yet.  8. Neuro: Baseline mild dementia.  Slurred speech. ?CVA versus encephalopathy.  Head CT negative. Ammonia level ok at 25. No fever.  - exam stable today.  9. Thrombocytopenia: Mild, follow closely.   10. Goals of Care: He seems to be clearly end-stage with inotrope dependent HF, progressive renal failure, dementia, hypotension and profound weakness. Palliative care following w/ ongoing discussion regarding hospice options. Daughter is planned to arrive  later today to take park in discussion. I have contacted palliative care team for updates regarding plan. They will try to meet w/ patient and family today to decide on arrangements. Will continue milrinone at current rate for now.   Length of Stay: [address] PA-C  06/28/2020, 9:25 AM  Advanced Heart Failure Team Pager [phone] (M-F; 7a - 5p)  Please contact [facility] for night-coverage after hours (5p -7a ) and weekends on [email or web address]  Patient seen with PA, agree with the above note.   He just got back from a walk with PT, breathing better overall.  Creatinine stable at 3.7 today.  Co-ox 76% with CVP 6.  He remains on milrinone 0.25.    General: NAD, frail.  Neck: No JVD, no thyromegaly or thyroid nodule.  Lungs: Clear to auscultation bilaterally with normal respiratory effort. CV: Nondisplaced PMI.  Heart regular S1/S2, no S3/S4, no murmur.  No peripheral edema.   Abdomen: Soft, nontender, no hepatosplenomegaly, no distention.  Skin: Intact without lesions or rashes.  Neurologic: Alert and oriented x 3.  Psych: Normal affect. Extremities: No clubbing or cyanosis.  HEENT: Normal.   Inotrope-dependent end stage  HF.  He is frail but was able to walk today.  Creatinine 3.7, stable.  Volume status now optimized.  He and family have spoken extensively with palliative care, plan for SNF with hospice. He will not be able to continue milrinone with hospice.  - Continue torsemide 40 qam/20 qpm - Decrease milrinone to 0.125, will plan to stop tomorrow.  - Think he can leave under hospice care tomorrow.   [name] 06/28/2020 2:27 PM

## 2020-06-28 NOTE — Care Management Important Message (Signed)
Important Message  Patient Details  Name: [name]. MRN: [id number] Date of Birth: December 28, 1935   Medicare Important Message Given:  Yes     Shelda Altes 06/28/2020, 11:57 AM

## 2020-06-28 NOTE — Plan of Care (Signed)

## 2020-06-28 NOTE — Progress Notes (Signed)
Palliative Medicine Progress Note  Medical records reviewed. Assessed patient at the bedside. He is sitting comfortably in the chair and denies pain or distress. His wife [name], daughter [name], and grandson are visiting at the bedside.  [name] states that she has a good understanding of patient's illness. She is glad to have made it safety from out of town and is enjoying the time spent with her father. She has no questions or concerns at this time.  Discussed medical team's question regarding plan for milrinone upon discharge. Provided clarification that [facility] hospice does not cover this medication, as it is aimed to prolong life rather than ensure comfort. [name] becomes tearful upon hearing this. Emotional support was provided.  [name] advocates tremendously for [name] and she creates space for him to voice his preference on this. He understands that the goal of milrinone is to stabilize and optimize his health in anticipation of peaceful death at [facility]. He continues to voice agreement with hospice philosophy and accepts that this and other life-prolonging medications will not be continued upon discharge.  Therapeutic listening provided. It is an honor to hear his story and witness the love and affection surrounding the patient. He has come to terms with his mortality and smiles as he cherishes his loved ones.  PMT will continue to support holistically.   Total time: 20 minutes  Greater than 50% of this time was spent counseling and coordinating care.  Dorthy Cooler, PA-C Palliative Medicine Team Team phone # [phone]  Thank you for allowing the Palliative Medicine Team to assist in the care of this patient. Please utilize secure chat with additional questions, if there is no response within 30 minutes please call the above phone number.  Palliative Medicine Team providers are available by phone from 7am to 7pm daily and can be reached through the team cell phone.   Should this patient require assistance outside of these hours, please call the patient's attending physician.

## 2020-06-28 NOTE — TOC Progression Note (Signed)
Transition of Care (TOC) - Progression Note    Patient Details  Name: [name]. MRN: [id number] Date of Birth: 1935/11/10  Transition of Care [facility]) CM/SW [place], [place] Phone Number: 06/28/2020, 11:47 AM  Clinical Narrative:     CSW tried to call Karlene Einstein with social work at [employer] to start insurance authorization for patient. CSW awaiting callback with confirmation that insurance authorization has been started. Patient has regular [employer]. Plan is for patient to go to [place] [facility] with Hospice Services to follow.CSW will continue to follow and assist with discharge planning needs.   Expected Discharge Plan: [place] Barriers to Discharge: Continued Medical Work up  Expected Discharge Plan and Services Expected Discharge Plan: [place] In-house Referral: Clinical Social Work     Living arrangements for the past 2 months: Materials engineer (From [place])                                       Social Determinants of Health (SDOH) Interventions    Readmission Risk Interventions No flowsheet data found.

## 2020-06-28 NOTE — Progress Notes (Signed)
0737 Cardiac rehab received consult. We will sign off to PT as pt more appropriate for their services at this time. Graylon Good RN BSN 06/28/2020 7:38 AM

## 2020-06-28 NOTE — Discharge Summary (Addendum)
Advanced Heart Failure Team  Discharge Summary   Patient ID: [name]. MRN: [id number], DOB/AGE: 83/10/1935 85 y.o. Admit date: 06/24/2020 D/C date:     06/29/2020   Primary Discharge Diagnoses:  Acute on Chronic, End-Stage Systolic Heart Failure w/ low output AKI on Stage IV CKD, Cardiorenal Syndrome Dementia, Mild Chronic Right Pleural Effusion (small on CXR) Paroxsymal Atrial Fibrillation  CAD w/ h/o CABG ICD    Hospital Course:   85 y.o.with h/o CAD s/p CABG, ischemic cardiomyopathy, and diabetes was referred by Dr. Angelena Form for evaluation of CHF. Patient had CABG in 2007. Last cath in 2016 showed all 4 grafts patent. Echo was 25-30% in 2016. Most recent echo in 8/20 showed EF 20-25% with moderate RV dysfunction. He has had a recurrent right pleural effusion with thoracenteses in 5/19 and 8/20. He thinks that he had been in atrial fibrillation persistently for about a year. He used to take amiodarone but stopped it because he "felt bad." However, he does not think that stopping amiodarone made him feel any better. Creatinine had been elevated, CKD stage 3.   At a prior appt, I thought he was volume overloaded and increased Lasix, but his creatinine steadily rose (2.55 =>3.13 =>3.72). I stopped Entresto and he never started spironolactone. Lasix was cut back to 60 mg daily. He says that during this time, he got his COVID vaccination and after that, he became severely nauseated for a number of days and ate/drank very little. This eventually resolved.   On 05/11/19, I cardioverted him back to NSR.   Later in 2/21, he had RHC. This showed elevated right and left heart filling pressures with low but not markedly low cardiac output. He was admitted for IV diuresis. While diuresing, he was maintained on milrinone. This was weaned prior to discharge. PYP scan done in the hospital was equivocal for ATTR amyloidosis. He was discharged on torsemide.   He was seen by Darrick Grinder in 5/21 with significant weight loss. He was thought to be volume depleted/dehydrated and creatinine was >5. He got IV fluid in the clinic but was still lightheaded and weak so was admitted. He got IV fluid in the hospital and torsemide was stopped. Weight started to rise, so he was started back on lower dose of torsemide, 40 qam/20 qpm. Over time, he dropped dose of torsemide down to 20 mg daily.   Echoin 2/22 showedEF 25-30% with global hypokinesis with apical akinesis, mildly decreased RV systolic function, PASP 56, moderate MR, IVC dilated.  He called the [facility] on 4/1 for a work-in appointment. He had been seen by our NP about 3 wks ago. At that time, weight was back down to his baseline and he was doing well. No changes were made. Over the last week, he had gone rapidly down-hill per his wife. Weight was up 12 lbs. We tried increasing his torsemide at home to 40 qam/20 qpm, but this has not helped. He had been short of breath walking around the house and endorsed orthopnea. Legs were swollen up to his thighs with blistering. ReDs Clip was markedly high at 57%. He  also had increased confusion and slurred speech for about 3 days. It was noted that in clinic that his speech was more slurred/not as fluent as normal for him. Wife was concerned he could have had a stroke. Subsequently, he was referred to the ED for further evaluation.  In ED, head CT showed no evidence of acute intracranial abnormality. There was  mild-to-moderate cerebral atrophy with comparatively mild cerebellar atrophy.  Labs c/w AKI. SCr up from 2.5>>3.40. BUN 68 K 4.1. BNP 1,325. WBC 5.3. Hgb 10.3 Ammonia normal at 25. Hs trop 28. CXR w/ rt sided pleural effusion and cardiomegaly.   He was admitted for a/c CHF and presumed low output, started on lasix gtt and empiric milrinone. PICC line placed for CVP and co-ox monitoring. He diuresed well. SCr bumped further but stabilized ~3.7.   Given end-stage  inotrope dependent HF, w/ progressive renal failure, dementia, hypotension and profound weakness, palliative care was consulted to discuss [place], as he is not a candidate for further advanced therapies. After discussion w/ patient and family, decision was made to transition to [place]. Arrangements were made. Milrinone was continued for palliation until residential hospice bed was available.    He was discharged to residential hospice facility on 06/29/20  Discharge Weight Range: 147 lb  Discharge Vitals: Blood pressure (!) 98/45, pulse 64, temperature 97.8 F (36.6 C), temperature source Oral, resp. rate 14, height '5\' 11"'$  (1.803 m), weight 67 kg, SpO2 95 %.  Labs: Lab Results  Component Value Date   WBC 5.6 06/29/2020   HGB 9.5 (L) 06/29/2020   HCT 29.2 (L) 06/29/2020   MCV 90.1 06/29/2020   PLT 99 (L) 06/29/2020    Recent Labs  Lab 06/24/20 1318 06/24/20 1326 06/29/20 0416  NA 137   < > 142  K 4.1   < > 4.0  CL 95*   < > 94*  CO2 33*   < > 37*  BUN 72*   < > 71*  CREATININE 3.41*   < > 3.65*  CALCIUM 9.0   < > 9.4  PROT 6.5  --   --   BILITOT 1.0  --   --   ALKPHOS 112  --   --   ALT 28  --   --   AST 41  --   --   GLUCOSE 126*   < > 104*   < > = values in this interval not displayed.   Lab Results  Component Value Date   CHOL 146 05/10/2020   HDL 64 05/10/2020   LDLCALC 75 05/10/2020   TRIG 37 05/10/2020   BNP (last 3 results) Recent Labs    11/26/19 1535 05/10/20 1217 06/24/20 1316  BNP 1,752.4* 1,821.5* 1,353.5*    ProBNP (last 3 results) No results for input(s): PROBNP in the last 8760 hours.   Diagnostic Studies/Procedures   No results found.  Discharge Medications   Allergies as of 06/29/2020   No Known Allergies     Medication List    STOP taking these medications   apixaban 5 MG Tabs tablet Commonly known as: ELIQUIS   empagliflozin 10 MG Tabs tablet Commonly known as: Jardiance   omeprazole 20 MG capsule Commonly known as:  PRILOSEC   simvastatin 20 MG tablet Commonly known as: ZOCOR   Vericiguat 10 MG Tabs     TAKE these medications   amiodarone 200 MG tablet Commonly known as: PACERONE Take 0.5 tablets (100 mg total) by mouth daily.   donepezil 10 MG tablet Commonly known as: ARICEPT Take 10 mg by mouth at bedtime.   midodrine 5 MG tablet Commonly known as: PROAMATINE Take 3 tablets (15 mg total) by mouth 3 (three) times daily with meals.   silver sulfADIAZINE 1 % cream Commonly known as: SILVADENE Apply 1 application topically daily as needed (wound care).   torsemide 20  MG tablet Commonly known as: DEMADEX Take '40mg'$  (2 tablets) by mouth daily, alternating with '20mg'$  (1 tablet) by mouth daily. What changed:   how much to take  how to take this  when to take this  additional instructions   Tresiba FlexTouch 200 UNIT/ML FlexTouch Pen Generic drug: insulin degludec Inject 6 Units into the skin daily as needed (CBG 100 or more).       Disposition   The patient will be discharged in stable condition to residential hospice       Duration of Discharge Encounter: Greater than 35 minutes   Signed, Nelida Gores  06/29/2020, 10:51 AM  Patient seen with PA, agree with the above note.   Patient is ready to go to [place] with hospice today.  We have stopped milrinone.  Will continue torsemide 40 qam/20 qpm.  Can stop anticoagulation but would continue amiodarone and midodrine.   [name] 06/29/2020 11:01 AM

## 2020-06-28 NOTE — TOC Progression Note (Signed)
Transition of Care (TOC) - Progression Note  Heart Failure   Patient Details  Name: Taedyn Montpetit. MRN: [id number] Date of Birth: 08-Dec-1935  Transition of Care [facility]) CM/SW Contact  Temara Lanum, [place] Phone Number: 06/28/2020, 3:17 PM  Clinical Narrative:    CSW spoke with Arley Phenix at [facility] [phone]) who reported that Mr. Vise will need a COVID-19 Test before arriving at the facility. CSW notified MD of needed COVID test ordered. Arley Phenix also reported that Mr. Bouley doesn't need insurance authorization and that he will be private pay and [facility] will do the insurance authorization on there end including any equipment needs for the patient.  TOC will continue to follow for d/c needs.    Expected Discharge Plan: [place] Barriers to Discharge: Continued Medical Work up  Expected Discharge Plan and Services Expected Discharge Plan: [place] In-house Referral: Clinical Social Work     Living arrangements for the past 2 months: Materials engineer (From [place])                                       Social Determinants of Health (SDOH) Interventions    Readmission Risk Interventions No flowsheet data found.  Lenita Peregrina, MSW, [place] Heart Failure Social Worker

## 2020-06-28 NOTE — Progress Notes (Signed)
This chaplain is present for F/U spiritual care with the Pt. and his wife-Elaine.  The Pt. is sitting up in the bedside recliner. The Pt. is able to respond appropriately to the conversation in the room.   The chaplain listens and lifts up the Pt. story telling,  connecting the stories to Pt. faith.  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 day. The Pt. hopes for a "peaceful death."  The chaplain understands [name] is waiting to be updated on the next steps and appreciates good communication as she holds the role of navigator for the family.  The Pt. is waiting for the Pt. daughter to arrive as we speak.  The Pt. and [name] accept the invitation for chaplain prayer and F/U spiritual care as needed.

## 2020-06-28 NOTE — Progress Notes (Signed)
Hydrologist [facility]) Hospital Liaison: RN note    Notified by Transition of Care Manger of patient/family request for [facility] services at [facility] SNF after discharge. Chart and patient information under review by [facility] physician. Hospice eligibility pending currently.    Writer spoke with [name] to initiate education related to hospice philosophy, services and team approach to care.      [name] verbalized understanding of information given. Per discussion, plan is for discharge to [facility] by [facility]AR.    Please send signed and completed DNR form home with patient/family. Patient will need prescriptions for discharge comfort medications.     DME needs have been discussed, patient currently has the following equipment in the home: rollater.   Patient does not need any DME at this time.      [facility] aware of the above. Please notify [facility] when patient is ready to leave the unit at discharge. (Call [phone] or [phone] after 5pm.) [facility] information and contact numbers given to [facility].      Please call with any hospice related questions.     Thank you for this referral.     Clementeen Hoof, BSN, [facility] (listed on AMION under Hospice and [place])  [phone]

## 2020-06-29 DIAGNOSIS — I5043 Acute on chronic combined systolic (congestive) and diastolic (congestive) heart failure: Secondary | ICD-10-CM | POA: Diagnosis not present

## 2020-06-29 LAB — CBC
HCT: 29.2 % — ABNORMAL LOW (ref 39.0–52.0)
Hemoglobin: 9.5 g/dL — ABNORMAL LOW (ref 13.0–17.0)
MCH: 29.3 pg (ref 26.0–34.0)
MCHC: 32.5 g/dL (ref 30.0–36.0)
MCV: 90.1 fL (ref 80.0–100.0)
Platelets: 99 10*3/uL — ABNORMAL LOW (ref 150–400)
RBC: 3.24 MIL/uL — ABNORMAL LOW (ref 4.22–5.81)
RDW: 16.4 % — ABNORMAL HIGH (ref 11.5–15.5)
WBC: 5.6 10*3/uL (ref 4.0–10.5)
nRBC: 0 % (ref 0.0–0.2)

## 2020-06-29 LAB — BASIC METABOLIC PANEL
Anion gap: 11 (ref 5–15)
BUN: 71 mg/dL — ABNORMAL HIGH (ref 8–23)
CO2: 37 mmol/L — ABNORMAL HIGH (ref 22–32)
Calcium: 9.4 mg/dL (ref 8.9–10.3)
Chloride: 94 mmol/L — ABNORMAL LOW (ref 98–111)
Creatinine, Ser: 3.65 mg/dL — ABNORMAL HIGH (ref 0.61–1.24)
GFR, Estimated: 16 mL/min — ABNORMAL LOW (ref >60)
Glucose, Bld: 104 mg/dL — ABNORMAL HIGH (ref 70–99)
Potassium: 4 mmol/L (ref 3.5–5.1)
Sodium: 142 mmol/L (ref 135–145)

## 2020-06-29 LAB — GLUCOSE, CAPILLARY
Glucose-Capillary: 87 mg/dL (ref 70–99)
Glucose-Capillary: 109 mg/dL — ABNORMAL HIGH (ref 70–99)

## 2020-06-29 LAB — COOXEMETRY PANEL
Carboxyhemoglobin: 1.4 % (ref 0.5–1.5)
Methemoglobin: 1 % (ref 0.0–1.5)
O2 Saturation: 64.8 %
Total hemoglobin: 9.8 g/dL — ABNORMAL LOW (ref 12.0–16.0)

## 2020-06-29 MED ORDER — MIDODRINE HCL 5 MG PO TABS: 15.0000 mg | ORAL_TABLET | Freq: Three times a day (TID) | ORAL | Status: AC

## 2020-06-29 NOTE — Progress Notes (Signed)
PICC removed per protocol and manual pressure held with folded 4x4 and vaseline gauze.  Patient began bleeding immediately after pressure removed.  Manual pressure held for 10 minutes and new 4x4 gauze and vaseline gauze applied.  Site noted to not be bleeding, pressure dressing in place.  Site continued to be monitored for 10 minutes with no further bleeding noted.  Christy, RN notified and agrees to continue monitoring site for further bleeding.

## 2020-06-29 NOTE — TOC Transition Note (Addendum)
Transition of Care (TOC) - CM/SW Discharge Note Heart Failure   Patient Details  Name: [name]. MRN: [id number] Date of Birth: 1935/09/14  Transition of Care [facility]) CM/SW Contact:  [place], [place] Phone Number: 06/29/2020, 10:50 AM   Clinical Narrative:    Patient will DC to: [place] with [facility]ospice/[facility] Anticipated DC date: 06/29/2020 Family notified: Yes Spouse Lyam Lamotte [phone] Transport by: Daughter wants to take patient to [facility]   Per MD patient ready for DC to [facility] with Hospice/[facility]. RN to call report prior to discharge [phone] RM#[id number]). RN, patient, patient's family, and facility notified of DC. Patient no longer on milrinone. Discharge Summary and FL2 sent to facility. DC packet on chart. Patients daughter to transport Mr. Lanigan to [facility] and spouse, Hospice, and [place] notified of daughter's transport of the patient.   CSW will sign off for now as social work intervention is no longer needed. Please consult [place] again if new needs arise.    Final next level of care: SNF with Hospice to follow  Final next level of care: [place] [facility] with [facility]/Hospice) Barriers to Discharge: Barriers Resolved   Patient Goals and CMS Choice Patient states their goals for this hospitalization and ongoing recovery are:: to return home CMS Medicare.gov Compare Post Acute Care list provided to:: Patient Choice offered to / list presented to : Spouse  Discharge Placement PASRR number recieved: 06/29/20 Existing PASRR number confirmed : 06/29/20          Patient chooses bed at: [facility] Patient to be transferred to facility by: Patients daughter Margarita Grizzle [phone] Name of family member notified: Spouse Mrs. Jiles Crocker Patient and family notified of of transfer: 06/29/20  Discharge Plan and Services In-house Referral: Clinical Social Work                                    Social Determinants of Health (SDOH) Interventions     Readmission Risk Interventions Readmission Risk Prevention Plan 06/29/2020  Transportation Screening Complete  PCP or Specialist Appt within 5-7 Days Complete  Home Care Screening Complete  Medication Review (RN CM) Complete  Some recent data might be hidden   Zahmir Lalla, MSW, [place] Heart Failure Social Worker

## 2020-06-29 NOTE — Progress Notes (Signed)
Palliative follow for symptom management and disposition.  Dorthy Cooler, PA and I met with the patient and his wife at bedside.  Patient is awake but lethargic.  He will respond to simple questions.  Family is encouraging him to eat.  Offered ice cream which the patient agreed that he wanted.  Obtained several flavors for the family to try to feed to him.   We collected HCPOA and Living will from his wife.  JC copied and returned the originals.  The copies will be scanned into V[employer].  Discussed disposition.  [place] vs a return to [facility] with a change in location.  Would provide Hospice at [facility] but would also need 24 hour care.  Wife would be unable to manage him in their independent living home.  Wife definitively choose [facility] with Hospice Support as she can not drive and would only be able to see him at [facility].  Went to [facility] office and discussed with Hassan Rowan.     Assessment:  Patient nearing end of life  Recommendation: DC to [facility] at a higher level of care with Hospice Support. HCPOA and Living Will copied. Symptoms appear managed at this time.   Florentina Jenny, PA-C Palliative Medicine Office:  [phone]  Greater than 50%  of this time was spent counseling and coordinating care related to the above assessment and plan.  25 min

## 2020-06-29 NOTE — Progress Notes (Signed)
Manufacturing engineer [facility])  Tentative plan to d/c today to [place] with hospice support.  Please update as soon as d/c date is confirmed so we can plan to enroll him in hospice as soon as he returns to [facility].  Venia Carbon RN, BSN, [place] [facility] Liaison  [phone] or Epic chat

## 2020-06-30 ENCOUNTER — Non-Acute Institutional Stay (SKILLED_NURSING_FACILITY): Payer: Medicare HMO | Admitting: Internal Medicine

## 2020-06-30 ENCOUNTER — Encounter: Payer: Self-pay | Admitting: Internal Medicine

## 2020-06-30 DIAGNOSIS — Z794 Long term (current) use of insulin: Secondary | ICD-10-CM

## 2020-06-30 DIAGNOSIS — E118 Type 2 diabetes mellitus with unspecified complications: Secondary | ICD-10-CM

## 2020-06-30 DIAGNOSIS — R531 Weakness: Secondary | ICD-10-CM

## 2020-06-30 DIAGNOSIS — I5043 Acute on chronic combined systolic (congestive) and diastolic (congestive) heart failure: Secondary | ICD-10-CM

## 2020-06-30 DIAGNOSIS — I255 Ischemic cardiomyopathy: Secondary | ICD-10-CM | POA: Diagnosis not present

## 2020-06-30 DIAGNOSIS — N179 Acute kidney failure, unspecified: Secondary | ICD-10-CM

## 2020-06-30 NOTE — Progress Notes (Addendum)
Provider:  Veleta Miners MD Location:    [place] Room Number: 15 Place of Service:  SNF (31)  PCP: Shirline Frees, MD Patient Care Team: Shirline Frees, MD as PCP - General (Family Medicine) Constance Haw, MD as PCP - Electrophysiology (Cardiology) Burnell Blanks, MD as PCP - Cardiology (Cardiology) Larey Dresser, MD as PCP - Advanced Heart Failure (Cardiology)  Extended Emergency Contact Information Primary Emergency Contact: [facility] Address: [address]          [place], [place] [address] Johnnette Litter of [place] Phone: [phone] Mobile Phone: [phone] Relation: Spouse Secondary Emergency Contact: Algis Downs Donell Sievert of [place] Phone: [phone] Mobile Phone: [phone] Relation: Daughter  Code Status: DNR Hospice Managed Care Goals of Care: Advanced Directive information Advanced Directives 06/30/2020  Does Patient Have a Medical Advance Directive? Yes  Type of Paramedic of [place];Out of facility DNR (pink MOST or yellow form)  Does patient want to make changes to medical advance directive? No - Patient declined  Copy of [place] in Chart? Yes - validated most recent copy scanned in chart (See row information)  Would patient like information on creating a medical advance directive? -  Pre-existing out of facility DNR order (yellow form or pink MOST form) Yellow form placed in chart (order not valid for inpatient use)      Chief Complaint  Patient presents with  . New Admit To SNF    Admission to SNF    HPI: Patient is a 85 y.o. male seen today for admission to SNF for Hospice care  Admitted in the hospital from 04/01-04/06 for End stage Systolic Failure and  AKD  Patient has a history of CAD s/p CABG, ischemic cardiomyopathy with last echo showing EF of 30 to 35% with moderate RV and LV  dysfunction and elevated pulmonary artery  pressure Also has history of PAF Acute over chronic renal insufficiency stage IV Anemia and Dementia   He was admitted in the hospital when he presented with shortness of breath and worsening lower extremity edema Was found to be in CHF was treated with diuresis and milrinone He diuresed but then developed worsening Renal Function Family decided to do  Hospice care  He was admitted in SNF as wife unable to take care of him at home with his multiple falls He is weak But denies any SOB or dizziness Poor Appetite no Chest pain no cough Has lost weight recently  No Nausea no Abdominal Pain     Past Medical History:  Diagnosis Date  . ANEMIA, HX OF   . Bronchial pneumonia Jan. 2016  . CAD, ARTERY BYPASS GRAFT   . CAROTID ARTERY STENOSIS   . Cataract   . CHF (congestive heart failure) ([place])   . DIABETES MELLITUS, TYPE II   . Diabetic retinal damage of right eye ([place]) 2015  . Diverticulosis   . HYPERCHOLESTEROLEMIA   . HYPERTENSION   . Myocardial infarction [place]) June 2007  . New onset atrial fibrillation ([place]) 06/24/2014  . PULMONARY HYPERTENSION   . RENAL INSUFFICIENCY, CHRONIC   . UNSPECIFIED THROMBOCYTOPENIA    Past Surgical History:  Procedure Laterality Date  . CARDIAC CATHETERIZATION  06/24/2014  . CARDIOVERSION [name] 05/11/2019   Procedure: CARDIOVERSION;  Surgeon: Larey Dresser, MD;  Location: [facility] ENDOSCOPY;  Service: Cardiovascular;  Laterality: [name];  . CATARACT EXTRACTION  2012   right eye  . COLONOSCOPY    . CORONARY  ARTERY BYPASS GRAFT      quad bypass/2007  . EYE SURGERY    . ICD IMPLANT [name] 06/11/2017   Procedure: ICD IMPLANT;  Surgeon: Constance Haw, MD;  Location: [facility] CV LAB;  Service: Cardiovascular;  Laterality: [name];  . IR THORACENTESIS ASP PLEURAL SPACE W/IMG GUIDE  10/28/2018  . LEFT HEART CATHETERIZATION WITH CORONARY/GRAFT ANGIOGRAM [name] 06/24/2014   Procedure: LEFT HEART CATHETERIZATION WITH Beatrix Fetters;  Surgeon: Burnell Blanks, MD;  Location: [facility] CATH LAB;  Service: Cardiovascular;  Laterality: [name];  . POLYPECTOMY    . RIGHT HEART CATH [name] 05/22/2019   Procedure: RIGHT HEART CATH;  Surgeon: Larey Dresser, MD;  Location: [facility] CV LAB;  Service: Cardiovascular;  Laterality: [name];  . TONSILLECTOMY      reports that he quit smoking about 50 years ago. His smoking use included cigarettes. He has never used smokeless tobacco. He reports current alcohol use of about 1.0 standard drink of alcohol per week. He reports that he does not use drugs. Social History   Socioeconomic History  . Marital status: Married    Spouse name: Not on file  . Number of children: 2  . Years of education: Not on file  . Highest education level: Not on file  Occupational History  . Occupation: Horticulturist, commercial: RETIRED  Tobacco Use  . Smoking status: Former Smoker    Types: Cigarettes    Quit date: 03/26/1970    Years since quitting: 50.2  . Smokeless tobacco: Never Used  Vaping Use  . Vaping Use: Never used  Substance and Sexual Activity  . Alcohol use: Yes    Alcohol/week: 1.0 standard drink    Types: 1 Glasses of wine per week  . Drug use: No  . Sexual activity: Not on file  Other Topics Concern  . Not on file  Social History Narrative  . Not on file   Social Determinants of Health   Financial Resource Strain: Not on file  Food Insecurity: Not on file  Transportation Needs: Not on file  Physical Activity: Not on file  Stress: Not on file  Social Connections: Not on file  Intimate Partner Violence: Not on file    Functional Status Survey:    Family History  Problem Relation Age of Onset  . Heart disease Father 61       Heart disease before age 78  . Heart attack Father   . Hypertension Father   . Hyperlipidemia Father   . Varicose Veins Father   . Stroke Father        ? not sure  . Colon cancer Maternal Aunt   . Colon cancer Maternal Uncle   . Diverticulitis Other   . Heart attack  Other     Health Maintenance  Topic Date Due  . FOOT EXAM  Never done  . OPHTHALMOLOGY EXAM  Never done  . URINE MICROALBUMIN  Never done  . TETANUS/TDAP  Never done  . COVID-19 Vaccine (3 - Booster for Moderna series) 11/03/2019  . INFLUENZA VACCINE  10/24/2020  . HEMOGLOBIN A1C  12/24/2020  . PNA vac Low Risk Adult  Completed  . HPV VACCINES  Aged Out    No Known Allergies  Allergies as of 06/30/2020   No Known Allergies     Medication List       Accurate as of June 30, 2020 11:49 AM. If you have any questions, ask your nurse or doctor.  STOP taking these medications   donepezil 10 MG tablet Commonly known as: ARICEPT Stopped by: Virgie Dad, MD     TAKE these medications   amiodarone 200 MG tablet Commonly known as: PACERONE Take 0.5 tablets (100 mg total) by mouth daily.   atropine 1 % ophthalmic ointment Place 1 application into both eyes every 4 (four) hours as needed.   haloperidol 0.5 MG tablet Commonly known as: HALDOL Take 0.5 mg by mouth every 4 (four) hours as needed for agitation.   midodrine 5 MG tablet Commonly known as: PROAMATINE Take 3 tablets (15 mg total) by mouth 3 (three) times daily with meals.   morphine CONCENTRATE 10 mg / 0.5 ml concentrated solution Take by mouth every 4 (four) hours as needed for severe pain (SOB, Pain). '20mg'$ /ml give '5mg'$ (0.30m) SL   silver sulfADIAZINE 1 % cream Commonly known as: SILVADENE Apply 1 application topically daily as needed (wound care).   torsemide 20 MG tablet Commonly known as: DEMADEX Take '40mg'$  (2 tablets) by mouth daily, alternating with '20mg'$  (1 tablet) by mouth daily. What changed:   how much to take  how to take this  when to take this  additional instructions   Tresiba FlexTouch 200 UNIT/ML FlexTouch Pen Generic drug: insulin degludec Inject 6 Units into the skin daily as needed (CBG 100 or more).   zinc oxide 20 % ointment Apply 1 application topically as needed for  irritation.       Review of Systems  Constitutional: Positive for activity change, appetite change and unexpected weight change.  Respiratory: Positive for shortness of breath.   Cardiovascular: Positive for leg swelling.  Gastrointestinal: Negative.   Genitourinary: Negative.   Musculoskeletal: Positive for gait problem.  Skin: Positive for wound.  Neurological: Positive for weakness.  Psychiatric/Behavioral: Positive for confusion.    Vitals:   06/30/20 1137  BP: (!) 94/53  Pulse: 66  Resp: 14  Temp: (!) 96.7 F (35.9 C)  SpO2: 93%  Weight: 147 lb (66.7 kg)  Height: '5\' 11"'$  (1.803 m)   Body mass index is 20.5 kg/m. Physical Exam Vitals reviewed.  Constitutional:      Comments: Very weak  HENT:     Head: Normocephalic.     Nose: Nose normal.     Mouth/Throat:     Mouth: Mucous membranes are moist.     Pharynx: Oropharynx is clear.  Eyes:     Pupils: Pupils are equal, round, and reactive to light.  Cardiovascular:     Rate and Rhythm: Normal rate. Rhythm irregular.     Pulses: Normal pulses.     Heart sounds: Murmur heard.    Pulmonary:     Effort: Pulmonary effort is normal.     Breath sounds: Normal breath sounds. No rales.  Abdominal:     General: Abdomen is flat. Bowel sounds are normal.     Palpations: Abdomen is soft.  Musculoskeletal:        General: Swelling present.     Cervical back: Neck supple.     Comments: Has 2 small Places in his shin area. Right one was dry and healed Left was still moist with mild discharge Not infected  Skin:    General: Skin is warm.  Neurological:     General: No focal deficit present.     Mental Status: He is alert.  Psychiatric:        Mood and Affect: Mood normal.        Thought Content:  Thought content normal.     Labs reviewed: Basic Metabolic Panel: Recent Labs    10/27/19 1158 11/26/19 1535 03/01/20 1454 06/24/20 1743 06/25/20 0500 06/27/20 0438 06/28/20 0452 06/29/20 0416  NA 137 135   < >   --    < > 138 141 142  K 3.5 3.7   < >  --    < > 3.6 3.9 4.0  CL 97* 93*   < >  --    < > 92* 93* 94*  CO2 30 29   < >  --    < > 35* 36* 37*  GLUCOSE 152* 157*   < >  --    < > 129* 111* 104*  BUN 70* 70*   < >  --    < > 72* 69* 71*  CREATININE 2.84* 2.98*   < >  --    < > 3.66* 3.70* 3.65*  CALCIUM 8.8* 8.4*   < >  --    < > 9.0 9.2 9.4  MG 2.5* 2.5*  --  2.6*  --   --   --   --    < > = values in this interval not displayed.   Liver Function Tests: Recent Labs    05/10/20 1217 06/24/20 1316 06/24/20 1318  AST 19 43* 41  ALT '13 27 28  '$ ALKPHOS 78 114 112  BILITOT 0.5 1.1 1.0  PROT 6.2* 6.4* 6.5  ALBUMIN 3.0* 3.2* 3.3*   Recent Labs    10/27/19 1158  LIPASE 24   Recent Labs    06/24/20 1319  AMMONIA 25   CBC: Recent Labs    08/15/19 0337 09/03/19 1010 10/27/19 1158 11/26/19 1535 06/24/20 1316 06/24/20 1318 06/27/20 0438 06/28/20 0452 06/29/20 0416  WBC 6.1   < > 7.5   < > 4.9   < > 5.3 5.6 5.6  NEUTROABS 4.8  --  6.5  --  4.0  --   --   --   --   HGB 9.7*   < > 10.9*   < > 10.5*   < > 9.3* 9.7* 9.5*  HCT 30.0*   < > 34.5*   < > 32.9*   < > 28.5* 30.1* 29.2*  MCV 93.2   < > 95.3   < > 91.9   < > 88.2 89.6 90.1  PLT 208   < > 188   < > 116*   < > 102* 101* 99*   < > = values in this interval not displayed.   Cardiac Enzymes: No results for input(s): CKTOTAL, CKMB, CKMBINDEX, TROPONINI in the last 8760 hours. BNP: Invalid input(s): POCBNP Lab Results  Component Value Date   HGBA1C 7.5 (H) 06/24/2020   Lab Results  Component Value Date   TSH 10.242 (H) 06/24/2020   No results found for: VITAMINB12 No results found for: FOLATE No results found for: IRON, TIBC, FERRITIN  Imaging and Procedures obtained prior to SNF admission: DG Chest 2 View  Result Date: 06/24/2020 CLINICAL DATA:  Shortness of breath. Additional history provided: Shortness of breath for 1 week, hypertension, diabetes, former smoker. EXAM: CHEST - 2 VIEW COMPARISON:  Prior chest  radiographs 10/27/2019 and earlier. Chest CT 11/26/2018. FINDINGS: Redemonstrated single lead left chest implantable cardiac device. Prior median sternotomy/CABG. Unchanged cardiomegaly. Aortic atherosclerosis. Unchanged opacity at the right lung base, reflecting a combination of scarring and pleural effusion. As before, there is chronic right-sided volume loss. Additional hazy opacity  within the right lung. The left lung is clear. No left-sided pleural effusion. No evidence of pneumothorax. No acute bony abnormality identified. IMPRESSION: Unchanged opacity at the right lung base, likely reflecting a combination of scarring and pleural effusion. Associated chronic right-sided volume loss. Additional hazy opacity within the right lung which may reflect the layering component of a right-sided pleural effusion, asymmetric edema, subsegmental atelectasis or possibly infection. Clinical correlation is recommended. Unchanged cardiomegaly. Aortic Atherosclerosis (ICD10-I70.0). Electronically Signed   By: Kellie Simmering DO   On: 06/24/2020 14:37   CT HEAD WO CONTRAST  Result Date: 06/24/2020 CLINICAL DATA:  Neuro deficit, acute, stroke suspected; stroke like symptoms beginning 1 week ago. Slurred speech and confusion. EXAM: CT HEAD WITHOUT CONTRAST TECHNIQUE: Contiguous axial images were obtained from the base of the skull through the vertex without intravenous contrast. COMPARISON:  No pertinent prior exams available for comparison. FINDINGS: Brain: Mild-to-moderate cerebral atrophy. Commensurate prominence of the ventricles and sulci. Comparatively mild cerebellar atrophy. There is no acute intracranial hemorrhage. No demarcated cortical infarct. No extra-axial fluid collection. No evidence of intracranial mass. No midline shift. Vascular: No hyperdense vessel.  Atherosclerotic calcifications. Skull: Normal. Negative for fracture or focal lesion. Sinuses/Orbits: Visualized orbits show no acute finding. Trace bilateral  ethmoid sinus mucosal thickening. Small right maxillary sinus mucous retention cyst. IMPRESSION: No evidence of acute intracranial abnormality. Mild-to-moderate cerebral atrophy with comparatively mild cerebellar atrophy. Electronically Signed   By: Kellie Simmering DO   On: 06/24/2020 14:32   [place] EKG SITE RITE  Result Date: 06/24/2020 If Site Rite image not attached, placement could not be confirmed due to current cardiac rhythm.   Assessment/Plan Acute on chronic combined systolic and diastolic CHF (congestive heart failure) (HCC) Continue on Demadex for comfort Also on midodrine  Acute renal failure, unspecified acute renal failure type ([place]) No Blood work up per family General weakness Supportive care Ischemic cardiomyopathy ON amiodarone for PAF For comfort on Roxanol and Haldol Controlled type 2 diabetes mellitus with complication, with long-term current use of insulin (HCC) CBG every morning Tresiba only if blood sugars more than    Family/ staff Communication:   Labs/tests ordered: Discussed in the room with wife and daughter Plan of care

## 2020-07-01 ENCOUNTER — Other Ambulatory Visit: Payer: Self-pay | Admitting: Family Medicine

## 2020-07-12 ENCOUNTER — Non-Acute Institutional Stay (SKILLED_NURSING_FACILITY): Payer: Medicare HMO | Admitting: Nurse Practitioner

## 2020-07-12 ENCOUNTER — Encounter: Payer: Self-pay | Admitting: Nurse Practitioner

## 2020-07-12 DIAGNOSIS — N184 Chronic kidney disease, stage 4 (severe): Secondary | ICD-10-CM | POA: Insufficient documentation

## 2020-07-12 DIAGNOSIS — R627 Adult failure to thrive: Secondary | ICD-10-CM | POA: Insufficient documentation

## 2020-07-12 DIAGNOSIS — I5022 Chronic systolic (congestive) heart failure: Secondary | ICD-10-CM | POA: Diagnosis not present

## 2020-07-12 DIAGNOSIS — E1122 Type 2 diabetes mellitus with diabetic chronic kidney disease: Secondary | ICD-10-CM | POA: Diagnosis not present

## 2020-07-12 DIAGNOSIS — L89892 Pressure ulcer of other site, stage 2: Secondary | ICD-10-CM

## 2020-07-12 DIAGNOSIS — I255 Ischemic cardiomyopathy: Secondary | ICD-10-CM

## 2020-07-12 DIAGNOSIS — I4891 Unspecified atrial fibrillation: Secondary | ICD-10-CM | POA: Diagnosis not present

## 2020-07-12 DIAGNOSIS — L89109 Pressure ulcer of unspecified part of back, unspecified stage: Secondary | ICD-10-CM | POA: Insufficient documentation

## 2020-07-12 DIAGNOSIS — I951 Orthostatic hypotension: Secondary | ICD-10-CM | POA: Diagnosis not present

## 2020-07-12 DIAGNOSIS — I959 Hypotension, unspecified: Secondary | ICD-10-CM | POA: Insufficient documentation

## 2020-07-12 DIAGNOSIS — R6251 Failure to thrive (child): Secondary | ICD-10-CM

## 2020-07-12 NOTE — Assessment & Plan Note (Signed)
FTT, comfort measures, on Morphine, Haldol

## 2020-07-12 NOTE — Assessment & Plan Note (Signed)
CKD, no further labs. Bun/creat 71/3.65 06/29/20, Hgb 9.5 06/29/20

## 2020-07-12 NOTE — Assessment & Plan Note (Signed)
PAF on Amiodarone, TSH 1.0 06/24/20

## 2020-07-12 NOTE — Assessment & Plan Note (Signed)
Chronic CHF, takes Torsemide, no apparent volume overload

## 2020-07-12 NOTE — Assessment & Plan Note (Signed)
T2DM, prn Tyler Aas, Hgb a1c 7.5 06/24/20

## 2020-07-12 NOTE — Assessment & Plan Note (Signed)
pressure ulcer stage in the mid spine and sacral areas, no s/s of bleeding. Assist with repositioning the patient frequently, apply hydrocolloid dressing q 3-5 days prn to the mid spine and sacrum open areas.

## 2020-07-12 NOTE — Progress Notes (Signed)
Location:   [facility] Room Number: 15-A Place of Service:  SNF (31) Provider: Lennie Odor Chenell Lozon NP  Shirline Frees, MD  Patient Care Team: Shirline Frees, MD as PCP - General (Family Medicine) Constance Haw, MD as PCP - Electrophysiology (Cardiology) Burnell Blanks, MD as PCP - Cardiology (Cardiology) Larey Dresser, MD as PCP - Advanced Heart Failure (Cardiology)  Extended Emergency Contact Information Primary Emergency Contact: [facility] Address: [address]          [place], [place] [address] Johnnette Litter of [place] Phone: [phone] Mobile Phone: [phone] Relation: Spouse Secondary Emergency Contact: Algis Downs Donell Sievert of [place] Phone: [phone] Mobile Phone: [phone] Relation: Daughter  Code Status:  DNR Goals of care: Advanced Directive information Advanced Directives 07/12/2020  Does Patient Have a Medical Advance Directive? Yes  Type of Paramedic of [place];Out of facility DNR (pink MOST or yellow form)  Does patient want to make changes to medical advance directive? No - Patient declined  Copy of [place] in Chart? Yes - validated most recent copy scanned in chart (See row information)  Would patient like information on creating a medical advance directive? -  Pre-existing out of facility DNR order (yellow form or pink MOST form) -     Chief Complaint  Patient presents with  . Acute Visit    Pressure ulcer sacrum back    HPI:  Pt is a 85 y.o. male seen today for an acute visit for pressure ulcer stage in the mid spine and sacral areas, no s/s of bleeding.   Chronic CHF, takes Torsemide, no apparent volume overload  Orthostatic hypotension, takes Midodrine  CKD, no further labs. Bun/creat 71/3.65 06/29/20, Hgb 9.5 06/29/20  Ischemic cardiomyopathy, supportive care  PAF on Amiodarone, TSH 1.0 06/24/20  FTT, comfort measures, on Morphine,  Haldol  T2DM, prn Tresiba, Hgb a1c 7.5 06/24/20       Past Medical History:  Diagnosis Date  . ANEMIA, HX OF   . Bronchial pneumonia Jan. 2016  . CAD, ARTERY BYPASS GRAFT   . CAROTID ARTERY STENOSIS   . Cataract   . CHF (congestive heart failure) ([place])   . DIABETES MELLITUS, TYPE II   . Diabetic retinal damage of right eye ([place]) 2015  . Diverticulosis   . HYPERCHOLESTEROLEMIA   . HYPERTENSION   . Myocardial infarction [facility]) June 2007  . New onset atrial fibrillation ([place]) 06/24/2014  . PULMONARY HYPERTENSION   . RENAL INSUFFICIENCY, CHRONIC   . UNSPECIFIED THROMBOCYTOPENIA    Past Surgical History:  Procedure Laterality Date  . CARDIAC CATHETERIZATION  06/24/2014  . CARDIOVERSION [name] 05/11/2019   Procedure: CARDIOVERSION;  Surgeon: Larey Dresser, MD;  Location: [facility] ENDOSCOPY;  Service: Cardiovascular;  Laterality: [name];  . CATARACT EXTRACTION  2012   right eye  . COLONOSCOPY    . CORONARY ARTERY BYPASS GRAFT      quad bypass/2007  . EYE SURGERY    . ICD IMPLANT [name] 06/11/2017   Procedure: ICD IMPLANT;  Surgeon: Constance Haw, MD;  Location: [facility] CV LAB;  Service: Cardiovascular;  Laterality: [name];  . IR THORACENTESIS ASP PLEURAL SPACE W/IMG GUIDE  10/28/2018  . LEFT HEART CATHETERIZATION WITH CORONARY/GRAFT ANGIOGRAM [name] 06/24/2014   Procedure: LEFT HEART CATHETERIZATION WITH Beatrix Fetters;  Surgeon: Burnell Blanks, MD;  Location: [facility] CATH LAB;  Service: Cardiovascular;  Laterality: [name];  . POLYPECTOMY    . RIGHT HEART CATH [name]  05/22/2019   Procedure: RIGHT HEART CATH;  Surgeon: Larey Dresser, MD;  Location: [facility] CV LAB;  Service: Cardiovascular;  Laterality: [name];  . TONSILLECTOMY      No Known Allergies  Allergies as of 07/12/2020   No Known Allergies     Medication List       Accurate as of July 12, 2020 11:59 PM. If you have any questions, ask your nurse or doctor.        amiodarone 200 MG tablet Commonly known as:  PACERONE Take 0.5 tablets (100 mg total) by mouth daily.   atropine 1 % ophthalmic ointment Place 1 application into both eyes every 4 (four) hours as needed.   haloperidol 0.5 MG tablet Commonly known as: HALDOL Take 0.5 mg by mouth every 4 (four) hours as needed for agitation.   midodrine 5 MG tablet Commonly known as: PROAMATINE Take 3 tablets (15 mg total) by mouth 3 (three) times daily with meals.   morphine CONCENTRATE 10 mg / 0.5 ml concentrated solution Take by mouth every 4 (four) hours as needed for severe pain (SOB, Pain). '20mg'$ /ml give '5mg'$ (0.37m) SL   silver sulfADIAZINE 1 % cream Commonly known as: SILVADENE Apply 1 application topically daily as needed (wound care).   torsemide 20 MG tablet Commonly known as: DEMADEX Take 20 mg by mouth every other day. What changed: Another medication with the same name was removed. Continue taking this medication, and follow the directions you see here. Changed by: Lesieli Bresee X Jorie Zee, NP   TTyler AasFlexTouch 200 UNIT/ML FlexTouch Pen Generic drug: insulin degludec Inject 6 Units into the skin daily as needed (CBG 100 or more).   triamcinolone cream 0.1 % Commonly known as: KENALOG Apply 1 application topically 2 (two) times daily as needed.   zinc oxide 20 % ointment Apply 1 application topically as needed for irritation.       Review of Systems  Constitutional: Positive for activity change, fatigue and unexpected weight change. Negative for fever.       #23 Ibs weight loss in less than 2 weeks?  HENT: Positive for hearing loss. Negative for congestion and voice change.   Respiratory: Negative for cough, shortness of breath and wheezing.   Cardiovascular: Positive for leg swelling. Negative for chest pain and palpitations.  Gastrointestinal: Negative for abdominal pain and constipation.  Genitourinary: Negative for difficulty urinating, dysuria and urgency.  Musculoskeletal: Positive for arthralgias and gait problem.  Skin:  Positive for wound.  Neurological: Negative for speech difficulty, weakness, light-headedness and headaches.  Psychiatric/Behavioral: Negative for behavioral problems and sleep disturbance. The patient is not nervous/anxious.     Immunization History  Administered Date(s) Administered  . Fluad Quad(high Dose 65+) 12/18/2018  . Influenza, High Dose Seasonal PF 12/29/2013, 01/09/2016, 01/08/2017, 01/06/2020  . Moderna Sars-Covid-2 Vaccination 04/06/2019, 05/06/2019  . Pneumococcal Conjugate-13 10/04/2014, 10/03/2016  . Pneumococcal Polysaccharide-23 08/23/2004  . Td 08/26/2006  . Zoster 07/31/2016, 02/28/2017  . Zoster Recombinat (Shingrix) 02/28/2017   Pertinent  Health Maintenance Due  Topic Date Due  . FOOT EXAM  Never done  . OPHTHALMOLOGY EXAM  Never done  . URINE MICROALBUMIN  Never done  . INFLUENZA VACCINE  10/24/2020  . HEMOGLOBIN A1C  12/24/2020  . PNA vac Low Risk Adult  Completed   Fall Risk  06/30/2019  Falls in the past year? 0  Risk for fall due to : Impaired balance/gait;Other (Comment)  Risk for fall due to: Comment Unable to do single leg stand.  Follow up Falls evaluation completed   Functional Status Survey:    Vitals:   07/12/20 1642  BP: 114/70  Pulse: 62  Resp: 16  Temp: (!) 96.5 F (35.8 C)  SpO2: 97%  Weight: 124 lb 8 oz (56.5 kg)  Height: '5\' 11"'$  (1.803 m)   Body mass index is 17.36 kg/m. Physical Exam Vitals and nursing note reviewed.  Constitutional:      Appearance: Normal appearance.  HENT:     Head: Normocephalic and atraumatic.     Nose: Nose normal.     Mouth/Throat:     Mouth: Mucous membranes are moist.  Eyes:     Extraocular Movements: Extraocular movements intact.     Conjunctiva/sclera: Conjunctivae normal.     Pupils: Pupils are equal, round, and reactive to light.  Cardiovascular:     Rate and Rhythm: Normal rate. Rhythm irregular.     Heart sounds: Murmur heard.    Pulmonary:     Effort: Pulmonary effort is normal.      Breath sounds: No wheezing or rales.  Abdominal:     General: Bowel sounds are normal.     Palpations: Abdomen is soft.     Tenderness: There is no abdominal tenderness.  Musculoskeletal:     Right lower leg: Edema present.     Left lower leg: Edema present.     Comments: Minimal in ankles  Skin:    General: Skin is warm and dry.     Comments: Pressure ulcer stage 2 mid spine, sacrum, open, no odorous drainage or bleeding.   Neurological:     General: No focal deficit present.     Mental Status: He is alert. Mental status is at baseline.     Motor: No weakness.     Coordination: Coordination normal.     Gait: Gait abnormal.  Psychiatric:        Mood and Affect: Mood normal.        Behavior: Behavior normal.     Labs reviewed: Recent Labs    10/27/19 1158 11/26/19 1535 03/01/20 1454 06/24/20 1743 06/25/20 0500 06/27/20 0438 06/28/20 0452 06/29/20 0416  NA 137 135   < >  --    < > 138 141 142  K 3.5 3.7   < >  --    < > 3.6 3.9 4.0  CL 97* 93*   < >  --    < > 92* 93* 94*  CO2 30 29   < >  --    < > 35* 36* 37*  GLUCOSE 152* 157*   < >  --    < > 129* 111* 104*  BUN 70* 70*   < >  --    < > 72* 69* 71*  CREATININE 2.84* 2.98*   < >  --    < > 3.66* 3.70* 3.65*  CALCIUM 8.8* 8.4*   < >  --    < > 9.0 9.2 9.4  MG 2.5* 2.5*  --  2.6*  --   --   --   --    < > = values in this interval not displayed.   Recent Labs    05/10/20 1217 06/24/20 1316 06/24/20 1318  AST 19 43* 41  ALT '13 27 28  '$ ALKPHOS 78 114 112  BILITOT 0.5 1.1 1.0  PROT 6.2* 6.4* 6.5  ALBUMIN 3.0* 3.2* 3.3*   Recent Labs    08/15/19 [id number] 09/03/19 1010 10/27/19 1158 11/26/19 1535  06/24/20 1316 06/24/20 1318 06/27/20 0438 06/28/20 0452 06/29/20 0416  WBC 6.1   < > 7.5   < > 4.9   < > 5.3 5.6 5.6  NEUTROABS 4.8  --  6.5  --  4.0  --   --   --   --   HGB 9.7*   < > 10.9*   < > 10.5*   < > 9.3* 9.7* 9.5*  HCT 30.0*   < > 34.5*   < > 32.9*   < > 28.5* 30.1* 29.2*  MCV 93.2   < > 95.3   <  > 91.9   < > 88.2 89.6 90.1  PLT 208   < > 188   < > 116*   < > 102* 101* 99*   < > = values in this interval not displayed.   Lab Results  Component Value Date   TSH 10.242 (H) 06/24/2020   Lab Results  Component Value Date   HGBA1C 7.5 (H) 06/24/2020   Lab Results  Component Value Date   CHOL 146 05/10/2020   HDL 64 05/10/2020   LDLCALC 75 05/10/2020   TRIG 37 05/10/2020   CHOLHDL 2.3 05/10/2020    Significant Diagnostic Results in last 30 days:  DG Chest 2 View  Result Date: 06/24/2020 CLINICAL DATA:  Shortness of breath. Additional history provided: Shortness of breath for 1 week, hypertension, diabetes, former smoker. EXAM: CHEST - 2 VIEW COMPARISON:  Prior chest radiographs 10/27/2019 and earlier. Chest CT 11/26/2018. FINDINGS: Redemonstrated single lead left chest implantable cardiac device. Prior median sternotomy/CABG. Unchanged cardiomegaly. Aortic atherosclerosis. Unchanged opacity at the right lung base, reflecting a combination of scarring and pleural effusion. As before, there is chronic right-sided volume loss. Additional hazy opacity within the right lung. The left lung is clear. No left-sided pleural effusion. No evidence of pneumothorax. No acute bony abnormality identified. IMPRESSION: Unchanged opacity at the right lung base, likely reflecting a combination of scarring and pleural effusion. Associated chronic right-sided volume loss. Additional hazy opacity within the right lung which may reflect the layering component of a right-sided pleural effusion, asymmetric edema, subsegmental atelectasis or possibly infection. Clinical correlation is recommended. Unchanged cardiomegaly. Aortic Atherosclerosis (ICD10-I70.0). Electronically Signed   By: Kellie Simmering DO   On: 06/24/2020 14:37   CT HEAD WO CONTRAST  Result Date: 06/24/2020 CLINICAL DATA:  Neuro deficit, acute, stroke suspected; stroke like symptoms beginning 1 week ago. Slurred speech and confusion. EXAM: CT HEAD  WITHOUT CONTRAST TECHNIQUE: Contiguous axial images were obtained from the base of the skull through the vertex without intravenous contrast. COMPARISON:  No pertinent prior exams available for comparison. FINDINGS: Brain: Mild-to-moderate cerebral atrophy. Commensurate prominence of the ventricles and sulci. Comparatively mild cerebellar atrophy. There is no acute intracranial hemorrhage. No demarcated cortical infarct. No extra-axial fluid collection. No evidence of intracranial mass. No midline shift. Vascular: No hyperdense vessel.  Atherosclerotic calcifications. Skull: Normal. Negative for fracture or focal lesion. Sinuses/Orbits: Visualized orbits show no acute finding. Trace bilateral ethmoid sinus mucosal thickening. Small right maxillary sinus mucous retention cyst. IMPRESSION: No evidence of acute intracranial abnormality. Mild-to-moderate cerebral atrophy with comparatively mild cerebellar atrophy. Electronically Signed   By: Kellie Simmering DO   On: 06/24/2020 14:32   CUP PACEART REMOTE DEVICE CHECK  Result Date: 06/15/2020 Scheduled remote reviewed. Normal device function.  Next remote 91 days. Kathy Breach, RN, CCDS, [employer]  [place] EKG SITE RITE  Result Date: 06/24/2020 If [facility]  image not attached, placement could not be confirmed due to current cardiac rhythm.   Assessment/Plan Pressure ulcer of foot, stage 2 (HCC) pressure ulcer stage in the mid spine and sacral areas, no s/s of bleeding. Assist with repositioning the patient frequently, apply hydrocolloid dressing q 3-5 days prn to the mid spine and sacrum open areas.   CHF (congestive heart failure) (HCC) Chronic CHF, takes Torsemide, no apparent volume overload  Hypotension Orthostatic hypotension, takes Midodrine  Chronic kidney disease, stage 4 (severe) (HCC) CKD, no further labs. Bun/creat 71/3.65 06/29/20, Hgb 9.5 06/29/20   Ischemic cardiomyopathy Ischemic cardiomyopathy, supportive care  Atrial fibrillation  with rapid ventricular response PAF on Amiodarone, TSH 1.0 06/24/20   Failure to thrive (child) FTT, comfort measures, on Morphine, Haldol   Type 2 diabetes mellitus with renal manifestations, controlled ([place]) T2DM, prn Tresiba, Hgb a1c 7.5 06/24/20    Family/ staff Communication: plan of care reviewed with the patient and charge nurse.   Labs/tests ordered:  none  Time spend 35 minutes.

## 2020-07-12 NOTE — Assessment & Plan Note (Signed)
Orthostatic hypotension, takes Midodrine

## 2020-07-12 NOTE — Assessment & Plan Note (Signed)
Ischemic cardiomyopathy, supportive care

## 2020-07-13 ENCOUNTER — Encounter: Payer: Self-pay | Admitting: Nurse Practitioner

## 2020-07-14 ENCOUNTER — Non-Acute Institutional Stay (SKILLED_NURSING_FACILITY): Payer: Medicare HMO | Admitting: Internal Medicine

## 2020-07-14 ENCOUNTER — Encounter: Payer: Self-pay | Admitting: Internal Medicine

## 2020-07-14 DIAGNOSIS — I4891 Unspecified atrial fibrillation: Secondary | ICD-10-CM

## 2020-07-14 DIAGNOSIS — K5900 Constipation, unspecified: Secondary | ICD-10-CM | POA: Diagnosis not present

## 2020-07-14 DIAGNOSIS — I5022 Chronic systolic (congestive) heart failure: Secondary | ICD-10-CM

## 2020-07-14 DIAGNOSIS — I951 Orthostatic hypotension: Secondary | ICD-10-CM

## 2020-07-14 DIAGNOSIS — E162 Hypoglycemia, unspecified: Secondary | ICD-10-CM | POA: Diagnosis not present

## 2020-07-14 DIAGNOSIS — I255 Ischemic cardiomyopathy: Secondary | ICD-10-CM

## 2020-07-14 DIAGNOSIS — N184 Chronic kidney disease, stage 4 (severe): Secondary | ICD-10-CM | POA: Diagnosis not present

## 2020-07-14 NOTE — Progress Notes (Signed)
Location:    [facility] Room Number: 15 Place of Service:  SNF [phone]) Provider:  Veleta Miners MD  Shirline Frees, MD  Patient Care Team: Shirline Frees, MD as PCP - General (Family Medicine) Constance Haw, MD as PCP - Electrophysiology (Cardiology) Burnell Blanks, MD as PCP - Cardiology (Cardiology) Larey Dresser, MD as PCP - Advanced Heart Failure (Cardiology)  Extended Emergency Contact Information Primary Emergency Contact: [facility] Address: [address]          [place], [place] [address] Johnnette Litter of [place] Phone: [phone] Mobile Phone: [phone] Relation: Spouse Secondary Emergency Contact: Algis Downs Donell Sievert of [place] Phone: [phone] Mobile Phone: [phone] Relation: Daughter  Code Status:  DNR Hospice Managed Care Goals of care: Advanced Directive information Advanced Directives 07/12/2020  Does Patient Have a Medical Advance Directive? Yes  Type of Paramedic of [place];Out of facility DNR (pink MOST or yellow form)  Does patient want to make changes to medical advance directive? No - Patient declined  Copy of [place] in Chart? Yes - validated most recent copy scanned in chart (See row information)  Would patient like information on creating a medical advance directive? -  Pre-existing out of facility DNR order (yellow form or pink MOST form) -     Chief Complaint  Patient presents with  . Acute Visit    Low blood sugar    HPI:  Pt is a 85 y.o. male seen today for an acute visit for Low BS this morning of 48 with Lethargy  Admitted in the hospital from 04/01-04/06 for End stage Systolic Failure and  AKD  Patient has a history of CAD s/p CABG, ischemic cardiomyopathy with last echo showing EF of 30 to 35% with moderate RV and LV  dysfunction and elevated pulmonary artery pressure Also has history of PAF Acute over chronic renal  insufficiency stage IV Anemia and Dementia  Enrolled in Hospice care  Was on Low dose of Prn Tresiba in the morning if CBG was more than 200. His CBG have been less then 200 but yesterday it was 270 in AM and Nurse gave her 6 units of Tyler Aas And this morning his CBG was 48 and per his Wife who stays with him he was lethargic He did respond to Glucose supplements Back to baseline now  No Nausea or vomiting Diet unchanged Does have poor appetite Also c/o Constipation requiring straining   Past Medical History:  Diagnosis Date  . ANEMIA, HX OF   . Bronchial pneumonia Jan. 2016  . CAD, ARTERY BYPASS GRAFT   . CAROTID ARTERY STENOSIS   . Cataract   . CHF (congestive heart failure) ([place])   . DIABETES MELLITUS, TYPE II   . Diabetic retinal damage of right eye ([place]) 2015  . Diverticulosis   . HYPERCHOLESTEROLEMIA   . HYPERTENSION   . Myocardial infarction [facility]) June 2007  . New onset atrial fibrillation ([place]) 06/24/2014  . PULMONARY HYPERTENSION   . RENAL INSUFFICIENCY, CHRONIC   . UNSPECIFIED THROMBOCYTOPENIA    Past Surgical History:  Procedure Laterality Date  . CARDIAC CATHETERIZATION  06/24/2014  . CARDIOVERSION [name] 05/11/2019   Procedure: CARDIOVERSION;  Surgeon: Larey Dresser, MD;  Location: [facility] ENDOSCOPY;  Service: Cardiovascular;  Laterality: [name];  . CATARACT EXTRACTION  2012   right eye  . COLONOSCOPY    . CORONARY ARTERY BYPASS GRAFT      quad bypass/2007  .  EYE SURGERY    . ICD IMPLANT [name] 06/11/2017   Procedure: ICD IMPLANT;  Surgeon: Constance Haw, MD;  Location: [facility] CV LAB;  Service: Cardiovascular;  Laterality: [name];  . IR THORACENTESIS ASP PLEURAL SPACE W/IMG GUIDE  10/28/2018  . LEFT HEART CATHETERIZATION WITH CORONARY/GRAFT ANGIOGRAM [name] 06/24/2014   Procedure: LEFT HEART CATHETERIZATION WITH Beatrix Fetters;  Surgeon: Burnell Blanks, MD;  [facility] CATH LAB;  Service: Cardiovascular;  Laterality: [name];  . POLYPECTOMY    .  RIGHT HEART CATH [name] 05/22/2019   Procedure: RIGHT HEART CATH;  Surgeon: Larey Dresser, MD;  Location: [place] CV LAB;  Service: Cardiovascular;  Laterality: [name];  . TONSILLECTOMY      No Known Allergies  Allergies as of 07/14/2020   No Known Allergies     Medication List       Accurate as of July 14, 2020 10:29 AM. If you have any questions, ask your nurse or doctor.        amiodarone 200 MG tablet Commonly known as: PACERONE Take 0.5 tablets (100 mg total) by mouth daily.   atropine 1 % ophthalmic ointment Place 1 application into both eyes every 4 (four) hours as needed.   GRX HYDROGEL GAUZE 4X4 EX Apply topically as needed.   haloperidol 0.5 MG tablet Commonly known as: HALDOL Take 0.5 mg by mouth every 4 (four) hours as needed for agitation.   haloperidol 0.5 MG tablet Commonly known as: HALDOL Take 0.5 mg by mouth at bedtime.   magnesium hydroxide 400 MG/5ML suspension Commonly known as: MILK OF MAGNESIA Take by mouth daily as needed for mild constipation.   midodrine 5 MG tablet Commonly known as: PROAMATINE Take 3 tablets (15 mg total) by mouth 3 (three) times daily with meals.   morphine 20 MG/ML concentrated solution Commonly known as: ROXANOL Take 4 mg by mouth 2 (two) times daily. Give 0.66m What changed: Another medication with the same name was removed. Continue taking this medication, and follow the directions you see here. Changed by: AVirgie Dad MD   Morphine Sulfate in Dextrose 100-5 MG/100ML-% Soln Inject into the vein every 4 (four) hours as needed. '20mg'$ /ml give '5mg'$ (0.259m SL Q 4 hrs PRN for SOB, Pain   sennosides-docusate sodium 8.6-50 MG tablet Commonly known as: SENOKOT-S Take 1 tablet by mouth 2 (two) times daily.   silver sulfADIAZINE 1 % cream Commonly known as: SILVADENE Apply 1 application topically daily as needed (wound care).   torsemide 20 MG tablet Commonly known as: DEMADEX Take 20 mg by mouth every other  day.   torsemide 20 MG tablet Commonly known as: DEMADEX Take 40 mg by mouth every other day.   TrTyler AaslexTouch 200 UNIT/ML FlexTouch Pen Generic drug: insulin degludec Inject 6 Units into the skin daily as needed (CBG 100 or more).   triamcinolone cream 0.1 % Commonly known as: KENALOG Apply 1 application topically 2 (two) times daily as needed.   zinc oxide 20 % ointment Apply 1 application topically as needed for irritation.       Review of Systems  Constitutional: Positive for activity change and appetite change.  HENT: Negative.   Respiratory: Negative.   Cardiovascular: Negative.   Gastrointestinal: Positive for constipation.  Genitourinary: Negative.   Musculoskeletal: Positive for gait problem.  Skin: Negative.   Neurological: Positive for weakness.  Psychiatric/Behavioral: Positive for confusion.    Immunization History  Administered Date(s) Administered  . Fluad Quad(high Dose 65+) 12/18/2018  .  Influenza, High Dose Seasonal PF 12/29/2013, 01/09/2016, 01/08/2017, 01/06/2020  . Moderna Sars-Covid-2 Vaccination 04/06/2019, 05/06/2019  . Pneumococcal Conjugate-13 10/04/2014, 10/03/2016  . Pneumococcal Polysaccharide-23 08/23/2004  . Td 08/26/2006  . Zoster 07/31/2016, 02/28/2017  . Zoster Recombinat (Shingrix) 02/28/2017   Pertinent  Health Maintenance Due  Topic Date Due  . FOOT EXAM  Never done  . OPHTHALMOLOGY EXAM  Never done  . URINE MICROALBUMIN  Never done  . INFLUENZA VACCINE  10/24/2020  . HEMOGLOBIN A1C  12/24/2020  . PNA vac Low Risk Adult  Completed   Fall Risk  06/30/2019  Falls in the past year? 0  Risk for fall due to : Impaired balance/gait;Other (Comment)  Risk for fall due to: Comment Unable to do single leg stand.  Follow up Falls evaluation completed   Functional Status Survey:    Vitals:   07/14/20 1013  BP: 114/70  Pulse: 62  Resp: 16  Temp: 97.6 F (36.4 C)  SpO2: 92%  Weight: 134 lb 9.6 oz (61.1 kg)  Height: '5\' 11"'$   (1.803 m)   Body mass index is 18.77 kg/m. Physical Exam Vitals reviewed.  Constitutional:      Appearance: Normal appearance.  HENT:     Head: Normocephalic.     Nose: Nose normal.     Mouth/Throat:     Mouth: Mucous membranes are moist.     Pharynx: Oropharynx is clear.  Eyes:     Pupils: Pupils are equal, round, and reactive to light.  Cardiovascular:     Rate and Rhythm: Normal rate. Rhythm irregular.     Pulses: Normal pulses.     Heart sounds: Murmur heard.    Pulmonary:     Effort: Pulmonary effort is normal.     Breath sounds: Normal breath sounds.  Abdominal:     General: Abdomen is flat. Bowel sounds are normal.     Palpations: Abdomen is soft.  Musculoskeletal:        General: No swelling.     Cervical back: Neck supple.  Skin:    General: Skin is warm.  Neurological:     Mental Status: He is alert and oriented to person, place, and time.  Psychiatric:        Mood and Affect: Mood normal.        Thought Content: Thought content normal.     Labs reviewed: Recent Labs    10/27/19 1158 11/26/19 1535 03/01/20 1454 06/24/20 1743 06/25/20 0500 06/27/20 0438 06/28/20 0452 06/29/20 0416  NA 137 135   < >  --    < > 138 141 142  K 3.5 3.7   < >  --    < > 3.6 3.9 4.0  CL 97* 93*   < >  --    < > 92* 93* 94*  CO2 30 29   < >  --    < > 35* 36* 37*  GLUCOSE 152* 157*   < >  --    < > 129* 111* 104*  BUN 70* 70*   < >  --    < > 72* 69* 71*  CREATININE 2.84* 2.98*   < >  --    < > 3.66* 3.70* 3.65*  CALCIUM 8.8* 8.4*   < >  --    < > 9.0 9.2 9.4  MG 2.5* 2.5*  --  2.6*  --   --   --   --    < > = values in  this interval not displayed.   Recent Labs    05/10/20 1217 06/24/20 1316 06/24/20 1318  AST 19 43* 41  ALT '13 27 28  '$ ALKPHOS 78 114 112  BILITOT 0.5 1.1 1.0  PROT 6.2* 6.4* 6.5  ALBUMIN 3.0* 3.2* 3.3*   Recent Labs    08/15/19 0337 09/03/19 1010 10/27/19 1158 11/26/19 1535 06/24/20 1316 06/24/20 1318 06/27/20 0438 06/28/20 0452  06/29/20 0416  WBC 6.1   < > 7.5   < > 4.9   < > 5.3 5.6 5.6  NEUTROABS 4.8  --  6.5  --  4.0  --   --   --   --   HGB 9.7*   < > 10.9*   < > 10.5*   < > 9.3* 9.7* 9.5*  HCT 30.0*   < > 34.5*   < > 32.9*   < > 28.5* 30.1* 29.2*  MCV 93.2   < > 95.3   < > 91.9   < > 88.2 89.6 90.1  PLT 208   < > 188   < > 116*   < > 102* 101* 99*   < > = values in this interval not displayed.   Lab Results  Component Value Date   TSH 10.242 (H) 06/24/2020   Lab Results  Component Value Date   HGBA1C 7.5 (H) 06/24/2020   Lab Results  Component Value Date   CHOL 146 05/10/2020   HDL 64 05/10/2020   LDLCALC 75 05/10/2020   TRIG 37 05/10/2020   CHOLHDL 2.3 05/10/2020    Significant Diagnostic Results in last 30 days:  DG Chest 2 View  Result Date: 06/24/2020 CLINICAL DATA:  Shortness of breath. Additional history provided: Shortness of breath for 1 week, hypertension, diabetes, former smoker. EXAM: CHEST - 2 VIEW COMPARISON:  Prior chest radiographs 10/27/2019 and earlier. Chest CT 11/26/2018. FINDINGS: Redemonstrated single lead left chest implantable cardiac device. Prior median sternotomy/CABG. Unchanged cardiomegaly. Aortic atherosclerosis. Unchanged opacity at the right lung base, reflecting a combination of scarring and pleural effusion. As before, there is chronic right-sided volume loss. Additional hazy opacity within the right lung. The left lung is clear. No left-sided pleural effusion. No evidence of pneumothorax. No acute bony abnormality identified. IMPRESSION: Unchanged opacity at the right lung base, likely reflecting a combination of scarring and pleural effusion. Associated chronic right-sided volume loss. Additional hazy opacity within the right lung which may reflect the layering component of a right-sided pleural effusion, asymmetric edema, subsegmental atelectasis or possibly infection. Clinical correlation is recommended. Unchanged cardiomegaly. Aortic Atherosclerosis (ICD10-I70.0).  Electronically Signed   By: Kellie Simmering DO   On: 06/24/2020 14:37   CT HEAD WO CONTRAST  Result Date: 06/24/2020 CLINICAL DATA:  Neuro deficit, acute, stroke suspected; stroke like symptoms beginning 1 week ago. Slurred speech and confusion. EXAM: CT HEAD WITHOUT CONTRAST TECHNIQUE: Contiguous axial images were obtained from the base of the skull through the vertex without intravenous contrast. COMPARISON:  No pertinent prior exams available for comparison. FINDINGS: Brain: Mild-to-moderate cerebral atrophy. Commensurate prominence of the ventricles and sulci. Comparatively mild cerebellar atrophy. There is no acute intracranial hemorrhage. No demarcated cortical infarct. No extra-axial fluid collection. No evidence of intracranial mass. No midline shift. Vascular: No hyperdense vessel.  Atherosclerotic calcifications. Skull: Normal. Negative for fracture or focal lesion. Sinuses/Orbits: Visualized orbits show no acute finding. Trace bilateral ethmoid sinus mucosal thickening. Small right maxillary sinus mucous retention cyst. IMPRESSION: No evidence of acute intracranial abnormality. Mild-to-moderate cerebral  atrophy with comparatively mild cerebellar atrophy. Electronically Signed   By: Kellie Simmering DO   On: 06/24/2020 14:32   CUP PACEART REMOTE DEVICE CHECK  Result Date: 06/15/2020 Scheduled remote reviewed. Normal device function.  Next remote 91 days. Kathy Breach, RN, CCDS, [employer]  [place] EKG SITE RITE  Result Date: 06/24/2020 If [facility] image not attached, placement could not be confirmed due to current cardiac rhythm.   Assessment/Plan Hypoglycemia Discontinue Tyler Aas Will just use short acting Insulin for CBG more then 200 in am  Constipation, unspecified constipation type Start on Miralax    Other issues Chronic systolic congestive heart failure (HCC) Continue Demadex  Orthostatic hypotension On Midodrine Chronic kidney disease, stage 4 (severe) ([place]) Hospice  care Ischemic cardiomyopathy On Amiodarone for PAF  Atrial fibrillation with rapid ventricular response (HCC) Amiodarone only    Family/ staff Communication:   Labs/tests ordered:

## 2020-07-18 ENCOUNTER — Other Ambulatory Visit: Payer: Self-pay

## 2020-07-18 DIAGNOSIS — I6523 Occlusion and stenosis of bilateral carotid arteries: Secondary | ICD-10-CM

## 2020-07-19 ENCOUNTER — Non-Acute Institutional Stay (SKILLED_NURSING_FACILITY): Payer: Medicare HMO | Admitting: Nurse Practitioner

## 2020-07-19 ENCOUNTER — Encounter: Payer: Self-pay | Admitting: Nurse Practitioner

## 2020-07-19 DIAGNOSIS — I951 Orthostatic hypotension: Secondary | ICD-10-CM

## 2020-07-19 DIAGNOSIS — E1122 Type 2 diabetes mellitus with diabetic chronic kidney disease: Secondary | ICD-10-CM | POA: Diagnosis not present

## 2020-07-19 DIAGNOSIS — N184 Chronic kidney disease, stage 4 (severe): Secondary | ICD-10-CM

## 2020-07-19 DIAGNOSIS — I4891 Unspecified atrial fibrillation: Secondary | ICD-10-CM

## 2020-07-19 DIAGNOSIS — R627 Adult failure to thrive: Secondary | ICD-10-CM | POA: Diagnosis not present

## 2020-07-19 DIAGNOSIS — I255 Ischemic cardiomyopathy: Secondary | ICD-10-CM | POA: Diagnosis not present

## 2020-07-19 NOTE — Assessment & Plan Note (Signed)
Orthostatic hypotension, takes Midodrine

## 2020-07-19 NOTE — Assessment & Plan Note (Signed)
The mid spine and sacral areas, no s/s of bleeding.

## 2020-07-19 NOTE — Assessment & Plan Note (Signed)
Chronic CHF, takes Torsemide, no apparent volume overload, Bun/creat 71/3.65

## 2020-07-19 NOTE — Progress Notes (Signed)
Location:   SNF [place] Room Number: 15 Place of Service:  SNF (31) Provider: Lennie Odor Dashan Chizmar NP  Shirline Frees, MD  Patient Care Team: Shirline Frees, MD as PCP - General (Family Medicine) Constance Haw, MD as PCP - Electrophysiology (Cardiology) Burnell Blanks, MD as PCP - Cardiology (Cardiology) Larey Dresser, MD as PCP - Advanced Heart Failure (Cardiology)  Extended Emergency Contact Information Primary Emergency Contact: [facility] Address: [address]          [place], [place] [address] Johnnette Litter of [place] Phone: [phone] Mobile Phone: [phone] Relation: Spouse Secondary Emergency Contact: Algis Downs Donell Sievert of [place] Phone: [phone] Mobile Phone: [phone] Relation: Daughter  Code Status:  DNR Goals of care: Advanced Directive information Advanced Directives 07/12/2020  Does Patient Have a Medical Advance Directive? Yes  Type of Paramedic of Comfort;Out of facility DNR (pink MOST or yellow form)  Does patient want to make changes to medical advance directive? No - Patient declined  Copy of [place] in Chart? Yes - validated most recent copy scanned in chart (See row information)  Would patient like information on creating a medical advance directive? -  Pre-existing out of facility DNR order (yellow form or pink MOST form) -     Chief Complaint  Patient presents with  . Acute Visit    Restless at night     HPI:  Pt is a 85 y.o. male seen today for an acute visit for restless from mid night, has been taking Haldol 0.'5mg'$  qhs and prn. HPOA desires to increase Haldol for better night sleep, rest  The mid spine and sacral areas, no s/s of bleeding.              Chronic CHF, takes Torsemide, no apparent volume overload, Bun/creat 71/3.65 06/29/20             Orthostatic hypotension, takes Midodrine             CKD, no further labs. Bun/creat 71/3.65  06/29/20, Hgb 9.5 06/29/20             Ischemic cardiomyopathy, supportive care             PAF on Amiodarone, TSH 1.0 06/24/20             FTT, comfort measures, on Morphine, Haldol             T2DM, insulin coverage over CBG >200, Hgb a1c 7.5 06/24/20  Constipation, takes MiraLax.                  Past Medical History:  Diagnosis Date  . ANEMIA, HX OF   . Bronchial pneumonia Jan. 2016  . CAD, ARTERY BYPASS GRAFT   . CAROTID ARTERY STENOSIS   . Cataract   . CHF (congestive heart failure) ([place])   . DIABETES MELLITUS, TYPE II   . Diabetic retinal damage of right eye ([place]) 2015  . Diverticulosis   . HYPERCHOLESTEROLEMIA   . HYPERTENSION   . Myocardial infarction [facility]) June 2007  . New onset atrial fibrillation ([place]) 06/24/2014  . PULMONARY HYPERTENSION   . RENAL INSUFFICIENCY, CHRONIC   . UNSPECIFIED THROMBOCYTOPENIA    Past Surgical History:  Procedure Laterality Date  . CARDIAC CATHETERIZATION  06/24/2014  . CARDIOVERSION [name] 05/11/2019   Procedure: CARDIOVERSION;  Surgeon: Larey Dresser, MD;  Location: [facility] ENDOSCOPY;  Service: Cardiovascular;  Laterality: [name];  . CATARACT EXTRACTION  2012   right eye  . COLONOSCOPY    . CORONARY ARTERY BYPASS GRAFT      quad bypass/2007  . EYE SURGERY    . ICD IMPLANT [name] 06/11/2017   Procedure: ICD IMPLANT;  Surgeon: Constance Haw, MD;  Location: [place] CV LAB;  Service: Cardiovascular;  Laterality: [name];  . IR THORACENTESIS ASP PLEURAL SPACE W/IMG GUIDE  10/28/2018  . LEFT HEART CATHETERIZATION WITH CORONARY/GRAFT ANGIOGRAM [name] 06/24/2014   Procedure: LEFT HEART CATHETERIZATION WITH Beatrix Fetters;  Surgeon: Burnell Blanks, MD;  Location: [facility] C[facility] LAB;  Service: Cardiovascular;  Laterality: [name];  . POLYPECTOMY    . RIGHT HEART CATH [name] 05/22/2019   Procedure: RIGHT HEART CATH;  Surgeon: Larey Dresser, MD;  Location: [place] CV LAB;  Service: Cardiovascular;  Laterality: [name];  . TONSILLECTOMY      No Known  Allergies  Allergies as of 07/19/2020   No Known Allergies     Medication List       Accurate as of July 19, 2020 11:59 PM. If you have any questions, ask your nurse or doctor.        amiodarone 200 MG tablet Commonly known as: PACERONE Take 0.5 tablets (100 mg total) by mouth daily.   atropine 1 % ophthalmic ointment Place 1 application into both eyes every 4 (four) hours as needed.   GRX HYDROGEL GAUZE 4X4 EX Apply topically as needed.   haloperidol 0.5 MG tablet Commonly known as: HALDOL Take 0.5 mg by mouth every 4 (four) hours as needed for agitation.   haloperidol 0.5 MG tablet Commonly known as: HALDOL Take 0.5 mg by mouth at bedtime.   magnesium hydroxide 400 MG/5ML suspension Commonly known as: MILK OF MAGNESIA Take by mouth daily as needed for mild constipation.   midodrine 5 MG tablet Commonly known as: PROAMATINE Take 3 tablets (15 mg total) by mouth 3 (three) times daily with meals.   morphine 20 MG/ML concentrated solution Commonly known as: ROXANOL Take 4 mg by mouth 2 (two) times daily. Give 0.32m   Morphine Sulfate in Dextrose 100-5 MG/100ML-% Soln Inject into the vein every 4 (four) hours as needed. '20mg'$ /ml give '5mg'$ (0.2105m SL Q 4 hrs PRN for SOB, Pain   sennosides-docusate sodium 8.6-50 MG tablet Commonly known as: SENOKOT-S Take 1 tablet by mouth 2 (two) times daily.   silver sulfADIAZINE 1 % cream Commonly known as: SILVADENE Apply 1 application topically daily as needed (wound care).   torsemide 20 MG tablet Commonly known as: DEMADEX Take 20 mg by mouth every other day.   torsemide 20 MG tablet Commonly known as: DEMADEX Take 40 mg by mouth every other day.   triamcinolone cream 0.1 % Commonly known as: KENALOG Apply 1 application topically 2 (two) times daily as needed.   zinc oxide 20 % ointment Apply 1 application topically as needed for irritation.       Review of Systems  Constitutional: Positive for activity  change, fatigue and unexpected weight change. Negative for fever.       #23 Ibs weight loss in less than 2 weeks?  HENT: Positive for hearing loss. Negative for congestion and voice change.   Respiratory: Negative for cough, shortness of breath and wheezing.   Cardiovascular: Positive for leg swelling. Negative for chest pain and palpitations.  Gastrointestinal: Negative for abdominal pain and constipation.  Genitourinary: Negative for difficulty urinating, dysuria and urgency.  Musculoskeletal: Positive for arthralgias and gait problem.  Skin: Positive for  wound.  Neurological: Negative for speech difficulty, weakness, light-headedness and headaches.  Psychiatric/Behavioral: Positive for sleep disturbance. Negative for behavioral problems. The patient is nervous/anxious.     Immunization History  Administered Date(s) Administered  . Fluad Quad(high Dose 65+) 12/18/2018  . Influenza, High Dose Seasonal PF 12/29/2013, 01/09/2016, 01/08/2017, 01/06/2020  . Moderna Sars-Covid-2 Vaccination 04/06/2019, 05/06/2019  . Pneumococcal Conjugate-13 10/04/2014, 10/03/2016  . Pneumococcal Polysaccharide-23 08/23/2004  . Td 08/26/2006  . Zoster 07/31/2016, 02/28/2017  . Zoster Recombinat (Shingrix) 02/28/2017   Pertinent  Health Maintenance Due  Topic Date Due  . FOOT EXAM  Never done  . OPHTHALMOLOGY EXAM  Never done  . URINE MICROALBUMIN  Never done  . INFLUENZA VACCINE  10/24/2020  . HEMOGLOBIN A1C  12/24/2020  . PNA vac Low Risk Adult  Completed   Fall Risk  06/30/2019  Falls in the past year? 0  Risk for fall due to : Impaired balance/gait;Other (Comment)  Risk for fall due to: Comment Unable to do single leg stand.  Follow up Falls evaluation completed   Functional Status Survey:    Vitals:   07/19/20 1411  BP: 122/77  Pulse: 69  Resp: 20  Temp: 98.5 F (36.9 C)  SpO2: 95%   There is no height or weight on file to calculate BMI. Physical Exam Vitals and nursing note  reviewed.  Constitutional:      Comments: weak  HENT:     Head: Normocephalic and atraumatic.     Nose: Nose normal.     Mouth/Throat:     Mouth: Mucous membranes are moist.  Eyes:     Extraocular Movements: Extraocular movements intact.     Conjunctiva/sclera: Conjunctivae normal.     Pupils: Pupils are equal, round, and reactive to light.  Cardiovascular:     Rate and Rhythm: Normal rate. Rhythm irregular.     Heart sounds: Murmur heard.    Pulmonary:     Effort: Pulmonary effort is normal.     Breath sounds: No wheezing or rales.  Abdominal:     General: Bowel sounds are normal.     Palpations: Abdomen is soft.     Tenderness: There is no abdominal tenderness.  Musculoskeletal:     Right lower leg: No edema.     Left lower leg: No edema.     Comments: Minimal in ankles  Skin:    General: Skin is warm and dry.     Comments: Pressure ulcer stage 2 mid spine, sacrum pressure ulcers, healing slowly.   Neurological:     General: No focal deficit present.     Mental Status: He is alert. Mental status is at baseline.     Motor: No weakness.     Coordination: Coordination normal.     Gait: Gait abnormal.  Psychiatric:        Mood and Affect: Mood normal.        Behavior: Behavior normal.     Labs reviewed: Recent Labs    10/27/19 1158 11/26/19 1535 03/01/20 1454 06/24/20 1743 06/25/20 0500 06/27/20 0438 06/28/20 0452 06/29/20 0416  NA 137 135   < >  --    < > 138 141 142  K 3.5 3.7   < >  --    < > 3.6 3.9 4.0  CL 97* 93*   < >  --    < > 92* 93* 94*  CO2 30 29   < >  --    < > 35*  36* 37*  GLUCOSE 152* 157*   < >  --    < > 129* 111* 104*  BUN 70* 70*   < >  --    < > 72* 69* 71*  CREATININE 2.84* 2.98*   < >  --    < > 3.66* 3.70* 3.65*  CALCIUM 8.8* 8.4*   < >  --    < > 9.0 9.2 9.4  MG 2.5* 2.5*  --  2.6*  --   --   --   --    < > = values in this interval not displayed.   Recent Labs    05/10/20 1217 06/24/20 1316 06/24/20 1318  AST 19 43* 41   ALT '13 27 28  '$ ALKPHOS 78 114 112  BILITOT 0.5 1.1 1.0  PROT 6.2* 6.4* 6.5  ALBUMIN 3.0* 3.2* 3.3*   Recent Labs    08/15/19 0337 09/03/19 1010 10/27/19 1158 11/26/19 1535 06/24/20 1316 06/24/20 1318 06/27/20 0438 06/28/20 0452 06/29/20 0416  WBC 6.1   < > 7.5   < > 4.9   < > 5.3 5.6 5.6  NEUTROABS 4.8  --  6.5  --  4.0  --   --   --   --   HGB 9.7*   < > 10.9*   < > 10.5*   < > 9.3* 9.7* 9.5*  HCT 30.0*   < > 34.5*   < > 32.9*   < > 28.5* 30.1* 29.2*  MCV 93.2   < > 95.3   < > 91.9   < > 88.2 89.6 90.1  PLT 208   < > 188   < > 116*   < > 102* 101* 99*   < > = values in this interval not displayed.   Lab Results  Component Value Date   TSH 10.242 (H) 06/24/2020   Lab Results  Component Value Date   HGBA1C 7.5 (H) 06/24/2020   Lab Results  Component Value Date   CHOL 146 05/10/2020   HDL 64 05/10/2020   LDLCALC 75 05/10/2020   TRIG 37 05/10/2020   CHOLHDL 2.3 05/10/2020    Significant Diagnostic Results in last 30 days:  DG Chest 2 View  Result Date: 06/24/2020 CLINICAL DATA:  Shortness of breath. Additional history provided: Shortness of breath for 1 week, hypertension, diabetes, former smoker. EXAM: CHEST - 2 VIEW COMPARISON:  Prior chest radiographs 10/27/2019 and earlier. Chest CT 11/26/2018. FINDINGS: Redemonstrated single lead left chest implantable cardiac device. Prior median sternotomy/CABG. Unchanged cardiomegaly. Aortic atherosclerosis. Unchanged opacity at the right lung base, reflecting a combination of scarring and pleural effusion. As before, there is chronic right-sided volume loss. Additional hazy opacity within the right lung. The left lung is clear. No left-sided pleural effusion. No evidence of pneumothorax. No acute bony abnormality identified. IMPRESSION: Unchanged opacity at the right lung base, likely reflecting a combination of scarring and pleural effusion. Associated chronic right-sided volume loss. Additional hazy opacity within the right lung  which may reflect the layering component of a right-sided pleural effusion, asymmetric edema, subsegmental atelectasis or possibly infection. Clinical correlation is recommended. Unchanged cardiomegaly. Aortic Atherosclerosis (ICD10-I70.0). Electronically Signed   By: Kellie Simmering DO   On: 06/24/2020 14:37   CT HEAD WO CONTRAST  Result Date: 06/24/2020 CLINICAL DATA:  Neuro deficit, acute, stroke suspected; stroke like symptoms beginning 1 week ago. Slurred speech and confusion. EXAM: CT HEAD WITHOUT CONTRAST TECHNIQUE: Contiguous axial images were obtained from the  base of the skull through the vertex without intravenous contrast. COMPARISON:  No pertinent prior exams available for comparison. FINDINGS: Brain: Mild-to-moderate cerebral atrophy. Commensurate prominence of the ventricles and sulci. Comparatively mild cerebellar atrophy. There is no acute intracranial hemorrhage. No demarcated cortical infarct. No extra-axial fluid collection. No evidence of intracranial mass. No midline shift. Vascular: No hyperdense vessel.  Atherosclerotic calcifications. Skull: Normal. Negative for fracture or focal lesion. Sinuses/Orbits: Visualized orbits show no acute finding. Trace bilateral ethmoid sinus mucosal thickening. Small right maxillary sinus mucous retention cyst. IMPRESSION: No evidence of acute intracranial abnormality. Mild-to-moderate cerebral atrophy with comparatively mild cerebellar atrophy. Electronically Signed   By: Kellie Simmering DO   On: 06/24/2020 14:32   [place] EKG SITE RITE  Result Date: 06/24/2020 If Site Rite image not attached, placement could not be confirmed due to current cardiac rhythm.   Assessment/Plan Adult failure to thrive comfort measures, on Morphine, increase Haldol to '1mg'$  qhs and prn.    Type 2 diabetes mellitus with renal manifestations, controlled (HCC) T2DM, insulin coverage over CBG >200, Hgb a1c 7.5 06/24/20. no further labs. Bun/creat 71/3.65 06/29/20, Hgb 9.5  06/29/20  Pressure ulcer of back The mid spine and sacral areas, no s/s of bleeding.    Chronic renal failure, stage 4 (severe) (HCC) Chronic CHF, takes Torsemide, no apparent volume overload, Bun/creat 71/3.65   Orthostatic hypotension Orthostatic hypotension, takes Midodrine   Ischemic cardiomyopathy Ischemic cardiomyopathy, supportive care  Atrial fibrillation with rapid ventricular response Heart rate is in control,  on Amiodarone, TSH 1.0 06/24/20     Family/ staff Communication: plan of care reviewed with the patient and charge nurse.   Labs/tests ordered:  none  Time spend 25 minutes.

## 2020-07-19 NOTE — Assessment & Plan Note (Signed)
Ischemic cardiomyopathy, supportive care

## 2020-07-19 NOTE — Assessment & Plan Note (Signed)
comfort measures, on Morphine, increase Haldol to '1mg'$  qhs and prn.

## 2020-07-19 NOTE — Assessment & Plan Note (Signed)
Heart rate is in control,  on Amiodarone, TSH 1.0 06/24/20

## 2020-07-19 NOTE — Assessment & Plan Note (Signed)
T2DM, insulin coverage over CBG >200, Hgb a1c 7.5 06/24/20. no further labs. Bun/creat 71/3.65 06/29/20, Hgb 9.5 06/29/20

## 2020-07-20 ENCOUNTER — Encounter: Payer: Self-pay | Admitting: Nurse Practitioner

## 2020-07-21 ENCOUNTER — Non-Acute Institutional Stay (SKILLED_NURSING_FACILITY): Payer: Medicare HMO | Admitting: Internal Medicine

## 2020-07-21 DIAGNOSIS — L8990 Pressure ulcer of unspecified site, unspecified stage: Secondary | ICD-10-CM | POA: Diagnosis not present

## 2020-07-21 DIAGNOSIS — I4891 Unspecified atrial fibrillation: Secondary | ICD-10-CM

## 2020-07-21 DIAGNOSIS — N184 Chronic kidney disease, stage 4 (severe): Secondary | ICD-10-CM

## 2020-07-21 DIAGNOSIS — I951 Orthostatic hypotension: Secondary | ICD-10-CM | POA: Diagnosis not present

## 2020-07-21 DIAGNOSIS — K5900 Constipation, unspecified: Secondary | ICD-10-CM | POA: Diagnosis not present

## 2020-07-21 DIAGNOSIS — I5022 Chronic systolic (congestive) heart failure: Secondary | ICD-10-CM

## 2020-07-21 DIAGNOSIS — E1122 Type 2 diabetes mellitus with diabetic chronic kidney disease: Secondary | ICD-10-CM | POA: Diagnosis not present

## 2020-07-21 DIAGNOSIS — I255 Ischemic cardiomyopathy: Secondary | ICD-10-CM | POA: Diagnosis not present

## 2020-07-21 DIAGNOSIS — F419 Anxiety disorder, unspecified: Secondary | ICD-10-CM

## 2020-07-21 MED ORDER — LORAZEPAM 0.5 MG PO TABS: 0.5000 mg | ORAL_TABLET | ORAL | 0 refills | Status: AC | PRN

## 2020-07-21 NOTE — Progress Notes (Signed)
Location: [employer] features editor of Service:  SNF [phone])  Provider:   Code Status: DNR [name] Goals of Care:  Advanced Directives 07/12/2020  Does Patient Have a Medical Advance Directive? Yes  Type of Paramedic of [place];Out of facility DNR (pink MOST or yellow form)  Does patient want to make changes to medical advance directive? No - Patient declined  Copy of [place] in Chart? Yes - validated most recent copy scanned in chart (See row information)  Would patient like information on creating a medical advance directive? -  Pre-existing out of facility DNR order (yellow form or pink MOST form) -     Chief Complaint  Patient presents with  . Acute Visit    HPI: Patient is a 85 y.o. male seen today for an acute visit for New Pressure wound with Pain in Left Heel and also help with Anxiety.  Admitted in the hospital from 04/01-04/06 for End stage Systolic Failure and  AKD Patient has a history of CAD s/p CABG, ischemic cardiomyopathy with last echo showing EF of 30 to 35% with moderate RVand LVdysfunction and elevated pulmonary artery pressure Also has history of PAF Acute over chronic renal insufficiency stage IV Anemiaand Dementia  Enrolled in Hospice care  Has Developed new Pressure wound in his Left heel with Pain Also Has Wound on his Right heel and Back in thoracic region   Also Wife concerned about his restlessness at night. Haldol not helping Appetite is poor Only drinks Supplements Weight is stable      Past Medical History:  Diagnosis Date  . ANEMIA, HX OF   . Bronchial pneumonia Jan. 2016  . CAD, ARTERY BYPASS GRAFT   . CAROTID ARTERY STENOSIS   . Cataract   . CHF (congestive heart failure) ([place])   . DIABETES MELLITUS, TYPE II   . Diabetic retinal damage of right eye ([place]) 2015  . Diverticulosis   . HYPERCHOLESTEROLEMIA   . HYPERTENSION   . Myocardial infarction [facility]) June 2007  . New onset  atrial fibrillation ([place]) 06/24/2014  . PULMONARY HYPERTENSION   . RENAL INSUFFICIENCY, CHRONIC   . UNSPECIFIED THROMBOCYTOPENIA     Past Surgical History:  Procedure Laterality Date  . CARDIAC CATHETERIZATION  06/24/2014  . CARDIOVERSION [name] 05/11/2019   Procedure: CARDIOVERSION;  Surgeon: [name] Dresser, MD;  Location: [facility] ENDOSCOPY;  Service: Cardiovascular;  Laterality: [name];  . CATARACT EXTRACTION  2012   right eye  . COLONOSCOPY    . CORONARY ARTERY BYPASS GRAFT      quad bypass/2007  . EYE SURGERY    . ICD IMPLANT [name] 06/11/2017   Procedure: ICD IMPLANT;  Surgeon: [name] Haw, MD;  Location: [facility] CV LAB;  Service: Cardiovascular;  Laterality: [name];  . IR THORACENTESIS ASP PLEURAL SPACE W/IMG GUIDE  10/28/2018  . LEFT HEART CATHETERIZATION WITH CORONARY/GRAFT ANGIOGRAM [name] 06/24/2014   Procedure: LEFT HEART CATHETERIZATION WITH [name];  Surgeon: [name] Blanks, MD;  Location: [facility] CATH LAB;  Service: Cardiovascular;  Laterality: [name];  . POLYPECTOMY    . RIGHT HEART CATH [name] 05/22/2019   Procedure: RIGHT HEART CATH;  Surgeon: [name] Dresser, MD;  Location: [place] CV LAB;  Service: Cardiovascular;  Laterality: [name];  . TONSILLECTOMY      No Known Allergies  Outpatient Encounter Medications as of 07/21/2020  Medication Sig  . LORazepam (ATIVAN) 0.5 MG tablet Take 1 tablet (0.5 mg total) by mouth  every 4 (four) hours as needed for anxiety.  [name] amiodarone (PACERONE) 200 MG tablet Take 0.5 tablets (100 mg total) by mouth daily.  [name] atropine 1 % ophthalmic ointment Place 1 application into both eyes every 4 (four) hours as needed.  . haloperidol (HALDOL) 0.5 MG tablet Take 0.5 mg by mouth every 4 (four) hours as needed for agitation.  . haloperidol (HALDOL) 0.5 MG tablet Take 0.5 mg by mouth at bedtime.  . magnesium hydroxide (MILK OF MAGNESIA) 400 MG/5ML suspension Take by mouth daily as needed for mild constipation.  . midodrine (PROAMATINE) 5  MG tablet Take 3 tablets (15 mg total) by mouth 3 (three) times daily with meals.  [name] morphine (ROXANOL) 20 MG/ML concentrated solution Take 4 mg by mouth 2 (two) times daily. Give 0.76m  . Morphine Sulfate in Dextrose 100-5 MG/100ML-% SOLN Inject into the vein every 4 (four) hours as needed. '20mg'$ /ml give '5mg'$ (0.238m SL Q 4 hrs PRN for SOB, Pain  . sennosides-docusate sodium (SENOKOT-S) 8.6-50 MG tablet Take 1 tablet by mouth 2 (two) times daily.  . silver sulfADIAZINE (SILVADENE) 1 % cream Apply 1 application topically daily as needed (wound care).  . torsemide (DEMADEX) 20 MG tablet Take 20 mg by mouth every other day.  . torsemide (DEMADEX) 20 MG tablet Take 40 mg by mouth every other day.  . triamcinolone cream (KENALOG) 0.1 % Apply 1 application topically 2 (two) times daily as needed.  . Wound Dressings (GRX HYDROGEL GAUZE 4X4 EX) Apply topically as needed.  . zinc oxide 20 % ointment Apply 1 application topically as needed for irritation.   No facility-administered encounter medications on file as of 07/21/2020.    Review of Systems:  Review of Systems  Constitutional: Positive for activity change and appetite change.  HENT: Negative.   Respiratory: Positive for shortness of breath.   Cardiovascular: Negative.   Gastrointestinal: Negative.   Genitourinary: Negative.   Musculoskeletal: Positive for arthralgias, back pain and gait problem.  Skin: Positive for wound.  Neurological: Positive for weakness.  Psychiatric/Behavioral: Positive for confusion and sleep disturbance. The patient is nervous/anxious.     Health Maintenance  Topic Date Due  . FOOT EXAM  Never done  . OPHTHALMOLOGY EXAM  Never done  . URINE MICROALBUMIN  Never done  . TETANUS/TDAP  08/25/2016  . COVID-19 Vaccine (3 - Booster for Moderna series) 11/03/2019  . INFLUENZA VACCINE  10/24/2020  . HEMOGLOBIN A1C  12/24/2020  . PNA vac Low Risk Adult  Completed  . HPV VACCINES  Aged Out    Physical  Exam: Vitals:   07/22/20 1229  BP: 122/74  Pulse: 64  Resp: 19  Temp: (!) 96.4 F (35.8 C)  Weight: 134 lb 9.6 oz (61.1 kg)   Body mass index is 18.77 kg/m. Physical Exam Vitals reviewed.  Constitutional:      Comments: Very Frail   HENT:     Head: Normocephalic.     Nose: Nose normal.     Mouth/Throat:     Mouth: Mucous membranes are moist.     Pharynx: Oropharynx is clear.  Eyes:     Pupils: Pupils are equal, round, and reactive to light.  Cardiovascular:     Rate and Rhythm: Normal rate.     Pulses: Normal pulses.  Pulmonary:     Effort: Pulmonary effort is normal.     Breath sounds: Normal breath sounds.  Abdominal:     General: Abdomen is flat. Bowel sounds are normal.  Palpations: Abdomen is soft.  Musculoskeletal:        General: No swelling.     Cervical back: Neck supple.  Skin:    Comments: DTI in Left Heel. Stage 2 in right heel Stage 2 in Back Also Stage 3 on top of Right Foot with Necrotic area and discharge but no infection  Neurological:     General: No focal deficit present.     Mental Status: He is alert.  Psychiatric:        Mood and Affect: Mood normal.        Thought Content: Thought content normal.     Labs reviewed: Basic Metabolic Panel: Recent Labs    09/03/19 1010 10/05/19 1009 10/27/19 1158 11/26/19 1535 03/01/20 1454 05/10/20 1217 06/24/20 1426 06/24/20 1743 06/25/20 0500 06/27/20 0438 06/28/20 0452 06/29/20 0416  NA 138   < > 137 135 137   < >  --   --    < > 138 141 142  K 4.0   < > 3.5 3.7 3.5   < >  --   --    < > 3.6 3.9 4.0  CL 95*   < > 97* 93* 90*   < >  --   --    < > 92* 93* 94*  CO2 31   < > 30 29 32   < >  --   --    < > 35* 36* 37*  GLUCOSE 310*   < > 152* 157* 248*   < >  --   --    < > 129* 111* 104*  BUN 63*   < > 70* 70* 48*   < >  --   --    < > 72* 69* 71*  CREATININE 3.32*   < > 2.84* 2.98* 2.69*   < >  --   --    < > 3.66* 3.70* 3.65*  CALCIUM 9.0   < > 8.8* 8.4* 9.2   < >  --   --    < > 9.0  9.2 9.4  MG  --   --  2.5* 2.5*  --   --   --  2.6*  --   --   --   --   TSH 2.180  --   --   --  2.593  --  10.242*  --   --   --   --   --    < > = values in this interval not displayed.   Liver Function Tests: Recent Labs    05/10/20 1217 06/24/20 1316 06/24/20 1318  AST 19 43* 41  ALT '13 27 28  '$ ALKPHOS 78 114 112  BILITOT 0.5 1.1 1.0  PROT 6.2* 6.4* 6.5  ALBUMIN 3.0* 3.2* 3.3*   Recent Labs    10/27/19 1158  LIPASE 24   Recent Labs    06/24/20 1319  AMMONIA 25   CBC: Recent Labs    08/15/19 0337 09/03/19 1010 10/27/19 1158 11/26/19 1535 06/24/20 1316 06/24/20 1318 06/27/20 0438 06/28/20 0452 06/29/20 0416  WBC 6.1   < > 7.5   < > 4.9   < > 5.3 5.6 5.6  NEUTROABS 4.8  --  6.5  --  4.0  --   --   --   --   HGB 9.7*   < > 10.9*   < > 10.5*   < > 9.3* 9.7* 9.5*  HCT 30.0*   < >  34.5*   < > 32.9*   < > 28.5* 30.1* 29.2*  MCV 93.2   < > 95.3   < > 91.9   < > 88.2 89.6 90.1  PLT 208   < > 188   < > 116*   < > 102* 101* 99*   < > = values in this interval not displayed.   Lipid Panel: Recent Labs    05/10/20 1217  CHOL 146  HDL 64  LDLCALC 75  TRIG 37  CHOLHDL 2.3   Lab Results  Component Value Date   HGBA1C 7.5 (H) 06/24/2020    Procedures since last visit: DG Chest 2 View  Result Date: 06/24/2020 CLINICAL DATA:  Shortness of breath. Additional history provided: Shortness of breath for 1 week, hypertension, diabetes, former smoker. EXAM: CHEST - 2 VIEW COMPARISON:  Prior chest radiographs 10/27/2019 and earlier. Chest CT 11/26/2018. FINDINGS: Redemonstrated single lead left chest implantable cardiac device. Prior median sternotomy/CABG. Unchanged cardiomegaly. Aortic atherosclerosis. Unchanged opacity at the right lung base, reflecting a combination of scarring and pleural effusion. As before, there is chronic right-sided volume loss. Additional hazy opacity within the right lung. The left lung is clear. No left-sided pleural effusion. No evidence of  pneumothorax. No acute bony abnormality identified. IMPRESSION: Unchanged opacity at the right lung base, likely reflecting a combination of scarring and pleural effusion. Associated chronic right-sided volume loss. Additional hazy opacity within the right lung which may reflect the layering component of a right-sided pleural effusion, asymmetric edema, subsegmental atelectasis or possibly infection. Clinical correlation is recommended. Unchanged cardiomegaly. Aortic Atherosclerosis (ICD10-I70.0). Electronically Signed   By: Kellie Simmering DO   On: 06/24/2020 14:37   CT HEAD WO CONTRAST  Result Date: 06/24/2020 CLINICAL DATA:  Neuro deficit, acute, stroke suspected; stroke like symptoms beginning 1 week ago. Slurred speech and confusion. EXAM: CT HEAD WITHOUT CONTRAST TECHNIQUE: Contiguous axial images were obtained from the base of the skull through the vertex without intravenous contrast. COMPARISON:  No pertinent prior exams available for comparison. FINDINGS: Brain: Mild-to-moderate cerebral atrophy. Commensurate prominence of the ventricles and sulci. Comparatively mild cerebellar atrophy. There is no acute intracranial hemorrhage. No demarcated cortical infarct. No extra-axial fluid collection. No evidence of intracranial mass. No midline shift. Vascular: No hyperdense vessel.  Atherosclerotic calcifications. Skull: Normal. Negative for fracture or focal lesion. Sinuses/Orbits: Visualized orbits show no acute finding. Trace bilateral ethmoid sinus mucosal thickening. Small right maxillary sinus mucous retention cyst. IMPRESSION: No evidence of acute intracranial abnormality. Mild-to-moderate cerebral atrophy with comparatively mild cerebellar atrophy. Electronically Signed   By: Kellie Simmering DO   On: 06/24/2020 14:32   [place] EKG SITE RITE  Result Date: 06/24/2020 If Site Rite image not attached, placement could not be confirmed due to current cardiac rhythm.   Assessment/Plan Pressure ulcers of skin of  multiple topographic sites Air Mattress. Foot Coverings Move him more to take pressure off his Heels and Back Cover PU with Hydrocolloid dressing Also Using Silver Alginate for Top of his Foot Wound Working with Dietary for Supplements Pain Control with Roxanol  Anxiety Will start on Ativan Already on Haldol per Hospice Other issues  Diabetes Mellitus On Novolog Bolus with Breakfast  Constipation, unspecified constipation type  on Miralax  Chronic systolic congestive heart failure (HCC) Continue Demadex  Orthostatic hypotension On Midodrine Chronic kidney disease, stage 4 (severe) (HCC) Hospice care Ischemic cardiomyopathy On Amiodarone for PAF  Atrial fibrillation with rapid ventricular response (HCC) Amiodarone only  Labs/tests ordered:  *  No order type specified * Next appt:  Visit date not found

## 2020-07-22 ENCOUNTER — Encounter: Payer: Self-pay | Admitting: Internal Medicine

## 2020-07-25 ENCOUNTER — Encounter: Payer: Self-pay | Admitting: Orthopedic Surgery

## 2020-07-25 ENCOUNTER — Non-Acute Institutional Stay (SKILLED_NURSING_FACILITY): Payer: Medicare HMO | Admitting: Orthopedic Surgery

## 2020-07-25 DIAGNOSIS — I255 Ischemic cardiomyopathy: Secondary | ICD-10-CM

## 2020-07-25 DIAGNOSIS — N184 Chronic kidney disease, stage 4 (severe): Secondary | ICD-10-CM | POA: Diagnosis not present

## 2020-07-25 DIAGNOSIS — L8990 Pressure ulcer of unspecified site, unspecified stage: Secondary | ICD-10-CM

## 2020-07-25 DIAGNOSIS — E1122 Type 2 diabetes mellitus with diabetic chronic kidney disease: Secondary | ICD-10-CM | POA: Diagnosis not present

## 2020-07-25 DIAGNOSIS — R627 Adult failure to thrive: Secondary | ICD-10-CM

## 2020-07-25 DIAGNOSIS — I4891 Unspecified atrial fibrillation: Secondary | ICD-10-CM

## 2020-07-25 DIAGNOSIS — I5022 Chronic systolic (congestive) heart failure: Secondary | ICD-10-CM | POA: Diagnosis not present

## 2020-07-25 DIAGNOSIS — K5900 Constipation, unspecified: Secondary | ICD-10-CM | POA: Diagnosis not present

## 2020-07-25 DIAGNOSIS — F419 Anxiety disorder, unspecified: Secondary | ICD-10-CM

## 2020-07-25 DIAGNOSIS — I951 Orthostatic hypotension: Secondary | ICD-10-CM | POA: Diagnosis not present

## 2020-07-25 NOTE — Progress Notes (Signed)
Location:   [place] Room Number: 15-A Place of Service:  SNF (31) Provider: Windell Moulding, NP     Patient Care Team: Shirline Frees, MD as PCP - General (Family Medicine) Constance Haw, MD as PCP - Electrophysiology (Cardiology) Burnell Blanks, MD as PCP - Cardiology (Cardiology) Larey Dresser, MD as PCP - Advanced Heart Failure (Cardiology)  Extended Emergency Contact Information Primary Emergency Contact: [facility] Address: [address]          [place], [place] [address] Johnnette Litter of [place] Phone: [phone] Mobile Phone: [phone] Relation: Spouse Secondary Emergency Contact: Algis Downs Donell Sievert of [place] Phone: [phone] Mobile Phone: [phone] Relation: Daughter  Code Status:  DNR Goals of care: Advanced Directive information Advanced Directives 07/25/2020  Does Patient Have a Medical Advance Directive? Yes  Type of Paramedic of [place];Out of facility DNR (pink MOST or yellow form)  Does patient want to make changes to medical advance directive? No - Patient declined  Copy of [place] in Chart? Yes - validated most recent copy scanned in chart (See row information)  Would patient like information on creating a medical advance directive? -  Pre-existing out of facility DNR order (yellow form or pink MOST form) -     Chief Complaint  Patient presents with  . Medical Management of Chronic Issues    Routine Visit.   [name] Health Maintenance    Discuss the need for Foot Exam, Eye exam, and Urine Microalbumin.  . Immunizations    Discuss the need for Tetanus Vaccine.    HPI:  Pt is a 85 y.o. male seen today for medical management of chronic diseases.    He continues to require skilled nursing and hospice care due to advanced ischemic cardiomyopathy and heart failure. In the past week it was determined Ativan cause opposite affect on him. He was  switched to haldol and appears more peaceful. He continues to have periods of alertness, but sleeping more since haldol started. Compliant with morning medications, nurse reports he is not as compliant in the afternoon. In addition, he has developed a new wound to the top of his right foot. Remains on 2 liters oxygen. Appetite poor, at times he is refusing his medications.   No recent falls or injuries.   Recent blood pressures are as follows:  04/26- 124/74  04/25- 122/77  04/19- 114/70  04/16- 111/67  Recent weights are as follows:  04/20- 134.6 lbs  04/13- 124.5 lbs  04/06- 147 lbs  AM blood glucose ranging 100-260's. No recent hypoglycemic events.   Nurse does not report any other concerns, vitals stable.    Past Medical History:  Diagnosis Date  . ANEMIA, HX OF   . Bronchial pneumonia Jan. 2016  . CAD, ARTERY BYPASS GRAFT   . CAROTID ARTERY STENOSIS   . Cataract   . CHF (congestive heart failure) ([place])   . DIABETES MELLITUS, TYPE II   . Diabetic retinal damage of right eye ([place]) 2015  . Diverticulosis   . HYPERCHOLESTEROLEMIA   . HYPERTENSION   . Myocardial infarction [facility]) June 2007  . New onset atrial fibrillation ([place]) 06/24/2014  . PULMONARY HYPERTENSION   . RENAL INSUFFICIENCY, CHRONIC   . UNSPECIFIED THROMBOCYTOPENIA    Past Surgical History:  Procedure Laterality Date  . CARDIAC CATHETERIZATION  06/24/2014  . CARDIOVERSION [name] 05/11/2019   Procedure: CARDIOVERSION;  Surgeon: Larey Dresser, MD;  Location: [facility] ENDOSCOPY;  Service: Cardiovascular;  Laterality: [name];  . CATARACT EXTRACTION  2012   right eye  . COLONOSCOPY    . CORONARY ARTERY BYPASS GRAFT      quad bypass/2007  . EYE SURGERY    . ICD IMPLANT [name] 06/11/2017   Procedure: ICD IMPLANT;  Surgeon: Constance Haw, MD;  Location: [place] CV LAB;  Service: Cardiovascular;  Laterality: [name];  . IR THORACENTESIS ASP PLEURAL SPACE W/IMG GUIDE  10/28/2018  . LEFT HEART CATHETERIZATION WITH  CORONARY/GRAFT ANGIOGRAM [name] 06/24/2014   Procedure: LEFT HEART CATHETERIZATION WITH Beatrix Fetters;  Surgeon: Burnell Blanks, MD;  Location: [facility] CATH LAB;  Service: Cardiovascular;  Laterality: [name];  . POLYPECTOMY    . RIGHT HEART CATH [name] 05/22/2019   Procedure: RIGHT HEART CATH;  Surgeon: Larey Dresser, MD;  Location: [place] CV LAB;  Service: Cardiovascular;  Laterality: [name];  . TONSILLECTOMY      No Known Allergies  Allergies as of 07/25/2020   No Known Allergies     Medication List       Accurate as of Jul 25, 2020 10:55 AM. If you have any questions, ask your nurse or doctor.        amiodarone 200 MG tablet Commonly known as: PACERONE Take 0.5 tablets (100 mg total) by mouth daily.   Arginaid Extra Liqd Take 237 mLs by mouth daily at 12 noon.   atropine 1 % ophthalmic ointment Place 1 application into both eyes every 4 (four) hours as needed.   GRX HYDROGEL GAUZE 4X4 EX Apply topically as needed.   haloperidol 0.5 MG tablet Commonly known as: HALDOL Take 0.5 mg by mouth every 4 (four) hours as needed for agitation.   haloperidol 0.5 MG tablet Commonly known as: HALDOL Take 1 mg by mouth at bedtime.   insulin aspart 100 UNIT/ML injection Commonly known as: novoLOG Inject 6 Units into the skin once. 6 units with Breakfast if CBG more then 200   LORazepam 0.5 MG tablet Commonly known as: Ativan Take 1 tablet (0.5 mg total) by mouth every 4 (four) hours as needed for anxiety.   magnesium hydroxide 400 MG/5ML suspension Commonly known as: MILK OF MAGNESIA Take by mouth daily as needed for mild constipation.   midodrine 5 MG tablet Commonly known as: PROAMATINE Take 3 tablets (15 mg total) by mouth 3 (three) times daily with meals.   morphine 20 MG/ML concentrated solution Commonly known as: ROXANOL Take 4 mg by mouth 2 (two) times daily. Give 0.72m   Morphine Sulfate in Dextrose 100-5 MG/100ML-% Soln Inject into the vein every 4  (four) hours as needed. '20mg'$ /ml give '5mg'$ (0.263m SL Q 4 hrs PRN for SOB, Pain   sennosides-docusate sodium 8.6-50 MG tablet Commonly known as: SENOKOT-S Take 1 tablet by mouth 2 (two) times daily.   silver sulfADIAZINE 1 % cream Commonly known as: SILVADENE Apply 1 application topically daily as needed (wound care).   torsemide 20 MG tablet Commonly known as: DEMADEX Take 20 mg by mouth every other day.   torsemide 20 MG tablet Commonly known as: DEMADEX Take 40 mg by mouth every other day.   triamcinolone cream 0.1 % Commonly known as: KENALOG Apply 1 application topically 2 (two) times daily as needed.   zinc oxide 20 % ointment Apply 1 application topically as needed for irritation.       Review of Systems  Unable to perform ROS: Patient nonverbal    Immunization History  Administered Date(s) Administered  .  Fluad Quad(high Dose 65+) 12/18/2018  . Influenza, High Dose Seasonal PF 12/29/2013, 01/09/2016, 01/08/2017, 01/06/2020  . Moderna Sars-Covid-2 Vaccination 04/06/2019, 05/06/2019, 01/19/2020  . Pneumococcal Conjugate-13 10/04/2014, 10/03/2016  . Pneumococcal Polysaccharide-23 08/23/2004  . Td 08/26/2006  . Zoster 07/31/2016, 02/28/2017  . Zoster Recombinat (Shingrix) 02/28/2017   Pertinent  Health Maintenance Due  Topic Date Due  . FOOT EXAM  Never done  . OPHTHALMOLOGY EXAM  Never done  . URINE MICROALBUMIN  Never done  . INFLUENZA VACCINE  10/24/2020  . HEMOGLOBIN A1C  12/24/2020  . PNA vac Low Risk Adult  Completed   Fall Risk  06/30/2019  Falls in the past year? 0  Risk for fall due to : Impaired balance/gait;Other (Comment)  Risk for fall due to: Comment Unable to do single leg stand.  Follow up Falls evaluation completed   Functional Status Survey:    Vitals:   07/25/20 1021  BP: 122/74  Pulse: 64  Resp: 19  Temp: (!) 97 F (36.1 C)  SpO2: 96%  Weight: 134 lb 9.6 oz (61.1 kg)  Height: '5\' 11"'$  (1.803 m)   Body mass index is 18.77  kg/m. Physical Exam Vitals reviewed.  Constitutional:      Appearance: He is cachectic.  HENT:     Head: Normocephalic.  Eyes:     General:        Right eye: No discharge.        Left eye: No discharge.  Cardiovascular:     Rate and Rhythm: Bradycardia present. Rhythm irregular.     Pulses: Normal pulses.     Heart sounds: Murmur heard.    Pulmonary:     Effort: Pulmonary effort is normal. No respiratory distress.     Breath sounds: Normal breath sounds. No wheezing or rales.  Abdominal:     General: Bowel sounds are normal. There is no distension.     Palpations: Abdomen is soft.     Tenderness: There is no abdominal tenderness.  Genitourinary:    Comments: Foley, urine yellow with sediment Musculoskeletal:     Right lower leg: No edema.     Left lower leg: No edema.  Skin:    General: Skin is warm and dry.     Capillary Refill: Capillary refill takes less than 2 seconds.     Findings: Lesion present.     Comments: "1.0cm x 1.5cm and along the spinal area on the mid back 5.5cm x 2.0cm- both stage 2."   Pea- sized sore to dorsal side of right foot, CDI, no sign of infection.   Neurological:     General: No focal deficit present.     Mental Status: He is easily aroused. Mental status is at baseline.     Motor: Weakness present.     Gait: Gait abnormal.     Comments: bedbound  Psychiatric:        Mood and Affect: Mood normal.        Behavior: Behavior normal.     Labs reviewed: Recent Labs    10/27/19 1158 11/26/19 1535 03/01/20 1454 06/24/20 1743 06/25/20 0500 06/27/20 0438 06/28/20 0452 06/29/20 0416  NA 137 135   < >  --    < > 138 141 142  K 3.5 3.7   < >  --    < > 3.6 3.9 4.0  CL 97* 93*   < >  --    < > 92* 93* 94*  CO2 30 29   < >  --    < >  35* 36* 37*  GLUCOSE 152* 157*   < >  --    < > 129* 111* 104*  BUN 70* 70*   < >  --    < > 72* 69* 71*  CREATININE 2.84* 2.98*   < >  --    < > 3.66* 3.70* 3.65*  CALCIUM 8.8* 8.4*   < >  --    < > 9.0  9.2 9.4  MG 2.5* 2.5*  --  2.6*  --   --   --   --    < > = values in this interval not displayed.   Recent Labs    05/10/20 1217 06/24/20 1316 06/24/20 1318  AST 19 43* 41  ALT '13 27 28  '$ ALKPHOS 78 114 112  BILITOT 0.5 1.1 1.0  PROT 6.2* 6.4* 6.5  ALBUMIN 3.0* 3.2* 3.3*   Recent Labs    08/15/19 0337 09/03/19 1010 10/27/19 1158 11/26/19 1535 06/24/20 1316 06/24/20 1318 06/27/20 0438 06/28/20 0452 06/29/20 0416  WBC 6.1   < > 7.5   < > 4.9   < > 5.3 5.6 5.6  NEUTROABS 4.8  --  6.5  --  4.0  --   --   --   --   HGB 9.7*   < > 10.9*   < > 10.5*   < > 9.3* 9.7* 9.5*  HCT 30.0*   < > 34.5*   < > 32.9*   < > 28.5* 30.1* 29.2*  MCV 93.2   < > 95.3   < > 91.9   < > 88.2 89.6 90.1  PLT 208   < > 188   < > 116*   < > 102* 101* 99*   < > = values in this interval not displayed.   Lab Results  Component Value Date   TSH 10.242 (H) 06/24/2020   Lab Results  Component Value Date   HGBA1C 7.5 (H) 06/24/2020   Lab Results  Component Value Date   CHOL 146 05/10/2020   HDL 64 05/10/2020   LDLCALC 75 05/10/2020   TRIG 37 05/10/2020   CHOLHDL 2.3 05/10/2020    Significant Diagnostic Results in last 30 days:  No results found.  Assessment/Plan 1. Pressure ulcers of skin of multiple topographic sites - multiple areas, new lesion to right dorsal foot -slow healing due to poor health - cont air mattress and foot coverings - cont dressing changes with hydrocolloid  - cont silver alginate to right dorsal foot - cont Boost bid   2. Anxiety - adverse effect with ativan- remains prn medication - he has improved with scheduled haldol and morphine  3. Controlled type 2 diabetes mellitus with stage 4 chronic kidney disease, without long-term current use of insulin (HCC) - no recent hypoglycemic events - cont novolog blous with breakfast  4. Orthostatic hypotension - stable with midodrine  5. Ischemic cardiomyopathy - cont supportive care  6. Atrial fibrillation with  rapid ventricular response (HCC) - rate cont with amiodarone - he is not on blood thinner due to poor health  7. Constipation, unspecified constipation type - stable with senna and milk of mag  8. Chronic systolic congestive heart failure (HCC) - some weight fluctuation, lungs clear, no ankle edema - cont dermadex  9. Chronic kidney disease, stage 4 (severe) (HCC) - BUN 3.65/ Creat 3.65 - remains on dermadex for CHF  10. Adult failure to thrive - poor appetite, continues to lose weight  Family/ staff Communication: plan discussed with nurse  Labs/tests ordered:  none

## 2020-07-27 ENCOUNTER — Encounter (HOSPITAL_COMMUNITY): Payer: PPO

## 2020-07-27 ENCOUNTER — Ambulatory Visit: Payer: PPO

## 2020-08-24 DEATH — deceased

## 2020-09-07 ENCOUNTER — Encounter (HOSPITAL_COMMUNITY): Payer: PPO | Admitting: Cardiology

## 2021-06-28 IMAGING — NM NM TUMOR LOCAL/TRACER DISTR WITH SPECT
3 series · 18 of 18 positions shown · non-contrast
Comparison: none

CLINICAL DATA: HEART FAILURE. CONCERN FOR CARDIAC AMYLOIDOSIS.

EXAM:
NUCLEAR MEDICINE TUMOR LOCALIZATION. PYP CARDIAC AMYLOIDOSIS SCAN
WITH SPECT
TECHNIQUE: Following intravenous administration of radiopharmaceutical,
anterior planar images of the chest were obtained. Regions of
interest were placed on the heart and contralateral chest wall for
quantitative assessment. Additional SPECT imaging of the chest was
obtained.
RADIOPHARMACEUTICALS:  21.5 mCi TECHNETIUM 99 PYROPHOSPHATE

[Series 1: spect - (id)_(id)_cor · 4.1mm · 4.14mm/px · 6 of 128 frames shown]
[frame 11/128]
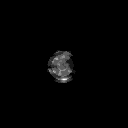
[frame 32/128]
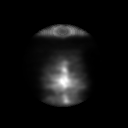
[frame 54/128]
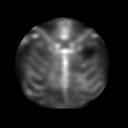
[frame 75/128]
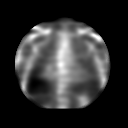
[frame 96/128]
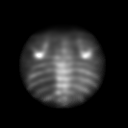
[frame 118/128]
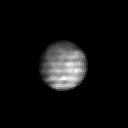

[Series 1: spect - (id)_(id)_tra · 4.1mm · 4.14mm/px · 6 of 128 frames shown]
[frame 11/128]
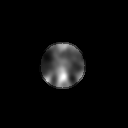
[frame 32/128]
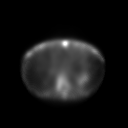
[frame 54/128]
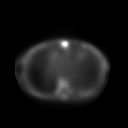
[frame 75/128]
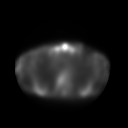
[frame 96/128]
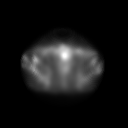
[frame 118/128]
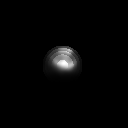

[Series 2: amyloid spect · 4.14mm/px · 6 of 64 frames shown]
[frame 6/64]
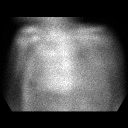
[frame 16/64]
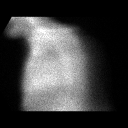
[frame 27/64]
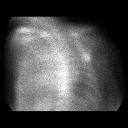
[frame 38/64]
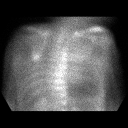
[frame 48/64]
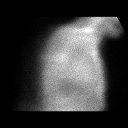
[frame 59/64]
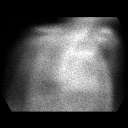

[18 of 18 positions shown; findings below may reference images not displayed]

FINDINGS: Planar Visual assessment:

Anterior planar imaging demonstrates radiotracer uptake within the
heart less than uptake within the adjacent ribs (Grade 1).

Quantitative assessment :

Quantitative assessment of the cardiac uptake compared to the
contralateral chest wall is equal to 1.6 (H/CL = 1.6).

SPECT assessment: SPECT imaging of the chest demonstrates mild
radiotracer accumulation within the LEFT ventricle.
IMPRESSION: Visual and quantitative assessment (grade 1, H/CLL equal 1.6) is
equivocal for transthyretin amyloidosis. Consider repeat exam in the
future.
